# Patient Record
Sex: Female | Born: 1955
Health system: Southern US, Community
[De-identification: ages and names within clinical notes are randomized; demographics above are authoritative.]

## PROBLEM LIST (undated history)

## (undated) DIAGNOSIS — D61818 Other pancytopenia: Secondary | ICD-10-CM

## (undated) DIAGNOSIS — I10 Essential (primary) hypertension: Secondary | ICD-10-CM

## (undated) DIAGNOSIS — E65 Localized adiposity: Secondary | ICD-10-CM

## (undated) HISTORY — DX: Localized adiposity: E65

## (undated) HISTORY — PX: TONSILLECTOMY: SUR1361

## (undated) HISTORY — DX: Essential (primary) hypertension: I10

## (undated) HISTORY — PX: KNEE SURGERY: SHX244

---

## 2000-10-13 ENCOUNTER — Encounter: Payer: Self-pay | Admitting: Family Medicine

## 2000-10-13 ENCOUNTER — Ambulatory Visit (HOSPITAL_COMMUNITY): Admission: RE | Admit: 2000-10-13 | Discharge: 2000-10-13 | Payer: Self-pay | Admitting: Family Medicine

## 2000-10-21 ENCOUNTER — Encounter: Payer: Self-pay | Admitting: Family Medicine

## 2000-10-21 ENCOUNTER — Ambulatory Visit (HOSPITAL_COMMUNITY): Admission: RE | Admit: 2000-10-21 | Discharge: 2000-10-21 | Payer: Self-pay | Admitting: Family Medicine

## 2001-01-01 ENCOUNTER — Ambulatory Visit: Admission: RE | Admit: 2001-01-01 | Discharge: 2001-01-01 | Payer: Self-pay | Admitting: Family Medicine

## 2001-01-01 ENCOUNTER — Encounter: Payer: Self-pay | Admitting: Family Medicine

## 2004-02-01 ENCOUNTER — Other Ambulatory Visit: Admission: RE | Admit: 2004-02-01 | Discharge: 2004-02-01 | Payer: Self-pay | Admitting: Family Medicine

## 2005-06-05 ENCOUNTER — Other Ambulatory Visit: Admission: RE | Admit: 2005-06-05 | Discharge: 2005-06-05 | Payer: Self-pay | Admitting: Family Medicine

## 2005-11-27 ENCOUNTER — Encounter: Admission: RE | Admit: 2005-11-27 | Discharge: 2005-11-27 | Payer: Self-pay | Admitting: Family Medicine

## 2006-06-25 ENCOUNTER — Other Ambulatory Visit: Admission: RE | Admit: 2006-06-25 | Discharge: 2006-06-25 | Payer: Self-pay | Admitting: Family Medicine

## 2006-09-09 ENCOUNTER — Encounter: Admission: RE | Admit: 2006-09-09 | Discharge: 2006-09-09 | Payer: Self-pay | Admitting: Orthopedic Surgery

## 2007-01-07 ENCOUNTER — Encounter: Admission: RE | Admit: 2007-01-07 | Discharge: 2007-01-07 | Payer: Self-pay | Admitting: Family Medicine

## 2008-01-08 ENCOUNTER — Encounter: Admission: RE | Admit: 2008-01-08 | Discharge: 2008-01-08 | Payer: Self-pay | Admitting: Family Medicine

## 2008-01-21 ENCOUNTER — Ambulatory Visit: Payer: Self-pay | Admitting: Cardiovascular Disease

## 2008-01-21 ENCOUNTER — Ambulatory Visit: Payer: Self-pay

## 2008-01-21 LAB — CONVERTED CEMR LAB
Free T4: 0.7 ng/dL (ref 0.6–1.6)
GFR calc non Af Amer: 70 mL/min
Glucose, Bld: 119 mg/dL — ABNORMAL HIGH (ref 70–99)
MCHC: 34.3 g/dL (ref 30.0–36.0)
Monocytes Relative: 7.2 % (ref 3.0–12.0)
Neutrophils Relative %: 52.7 % (ref 43.0–77.0)
Platelets: 255 10*3/uL (ref 150–400)
RBC: 4.03 M/uL (ref 3.87–5.11)
RDW: 13.6 % (ref 11.5–14.6)
T3, Free: 2.8 pg/mL (ref 2.3–4.2)
TSH: 1.13 microintl units/mL (ref 0.35–5.50)
WBC: 6.6 10*3/uL (ref 4.5–10.5)

## 2008-02-08 ENCOUNTER — Ambulatory Visit: Payer: Self-pay

## 2008-02-08 ENCOUNTER — Encounter: Payer: Self-pay | Admitting: Cardiovascular Disease

## 2008-02-22 ENCOUNTER — Ambulatory Visit: Payer: Self-pay | Admitting: Cardiovascular Disease

## 2008-03-17 ENCOUNTER — Encounter: Payer: Self-pay | Admitting: Cardiovascular Disease

## 2008-03-17 ENCOUNTER — Ambulatory Visit: Payer: Self-pay | Admitting: Cardiovascular Disease

## 2008-03-17 DIAGNOSIS — E663 Overweight: Secondary | ICD-10-CM | POA: Insufficient documentation

## 2008-03-17 DIAGNOSIS — I428 Other cardiomyopathies: Secondary | ICD-10-CM

## 2008-03-17 DIAGNOSIS — M109 Gout, unspecified: Secondary | ICD-10-CM

## 2008-03-17 DIAGNOSIS — I10 Essential (primary) hypertension: Secondary | ICD-10-CM | POA: Insufficient documentation

## 2008-03-17 DIAGNOSIS — R002 Palpitations: Secondary | ICD-10-CM | POA: Insufficient documentation

## 2008-04-08 ENCOUNTER — Ambulatory Visit: Payer: Self-pay | Admitting: Cardiovascular Disease

## 2008-04-08 ENCOUNTER — Ambulatory Visit: Payer: Self-pay

## 2008-06-06 ENCOUNTER — Encounter: Payer: Self-pay | Admitting: Cardiovascular Disease

## 2008-06-06 ENCOUNTER — Ambulatory Visit: Payer: Self-pay | Admitting: Cardiovascular Disease

## 2008-06-06 LAB — CONVERTED CEMR LAB
ALT: 24 units/L (ref 0–35)
AST: 21 units/L (ref 0–37)
Bilirubin, Direct: 0.1 mg/dL (ref 0.0–0.3)
CO2: 29 meq/L (ref 19–32)
Calcium: 9.2 mg/dL (ref 8.4–10.5)
Chloride: 108 meq/L (ref 96–112)
Glucose, Bld: 152 mg/dL — ABNORMAL HIGH (ref 70–99)
Magnesium: 2.1 mg/dL (ref 1.5–2.5)
Potassium: 4 meq/L (ref 3.5–5.1)
Sodium: 143 meq/L (ref 135–145)
Total Bilirubin: 0.6 mg/dL (ref 0.3–1.2)
Total Protein: 6.4 g/dL (ref 6.0–8.3)

## 2008-06-09 ENCOUNTER — Encounter (INDEPENDENT_AMBULATORY_CARE_PROVIDER_SITE_OTHER): Payer: Self-pay | Admitting: *Deleted

## 2008-07-15 ENCOUNTER — Other Ambulatory Visit: Admission: RE | Admit: 2008-07-15 | Discharge: 2008-07-15 | Payer: Self-pay | Admitting: Family Medicine

## 2008-08-08 ENCOUNTER — Ambulatory Visit: Payer: Self-pay

## 2008-08-08 ENCOUNTER — Ambulatory Visit: Payer: Self-pay | Admitting: Cardiovascular Disease

## 2008-08-08 ENCOUNTER — Encounter: Payer: Self-pay | Admitting: Cardiovascular Disease

## 2008-09-30 ENCOUNTER — Telehealth: Payer: Self-pay | Admitting: Cardiovascular Disease

## 2008-11-16 ENCOUNTER — Ambulatory Visit: Payer: Self-pay | Admitting: Internal Medicine

## 2008-12-07 ENCOUNTER — Encounter (INDEPENDENT_AMBULATORY_CARE_PROVIDER_SITE_OTHER): Payer: Self-pay | Admitting: *Deleted

## 2009-01-09 ENCOUNTER — Encounter: Admission: RE | Admit: 2009-01-09 | Discharge: 2009-01-09 | Payer: Self-pay | Admitting: Family Medicine

## 2009-03-07 ENCOUNTER — Ambulatory Visit: Payer: Self-pay | Admitting: Cardiovascular Disease

## 2009-05-29 ENCOUNTER — Ambulatory Visit: Payer: Self-pay | Admitting: Cardiovascular Disease

## 2009-07-24 ENCOUNTER — Telehealth: Payer: Self-pay | Admitting: Cardiovascular Disease

## 2009-08-02 ENCOUNTER — Ambulatory Visit: Payer: Self-pay | Admitting: Cardiovascular Disease

## 2009-08-04 ENCOUNTER — Encounter (INDEPENDENT_AMBULATORY_CARE_PROVIDER_SITE_OTHER): Payer: Self-pay | Admitting: *Deleted

## 2009-08-25 ENCOUNTER — Ambulatory Visit: Payer: Self-pay | Admitting: Cardiovascular Disease

## 2009-08-25 ENCOUNTER — Ambulatory Visit (HOSPITAL_COMMUNITY): Admission: RE | Admit: 2009-08-25 | Discharge: 2009-08-25 | Payer: Self-pay | Admitting: Cardiovascular Disease

## 2009-08-28 ENCOUNTER — Telehealth: Payer: Self-pay | Admitting: Cardiovascular Disease

## 2009-10-02 ENCOUNTER — Encounter: Payer: Self-pay | Admitting: Cardiovascular Disease

## 2009-10-04 ENCOUNTER — Telehealth: Payer: Self-pay | Admitting: Cardiovascular Disease

## 2009-10-30 ENCOUNTER — Other Ambulatory Visit: Admission: RE | Admit: 2009-10-30 | Discharge: 2009-10-30 | Payer: Self-pay | Admitting: Family Medicine

## 2009-12-20 ENCOUNTER — Ambulatory Visit: Payer: Self-pay | Admitting: Cardiovascular Disease

## 2010-01-17 ENCOUNTER — Encounter: Admission: RE | Admit: 2010-01-17 | Discharge: 2010-01-17 | Payer: Self-pay | Admitting: Family Medicine

## 2010-03-01 ENCOUNTER — Telehealth: Payer: Self-pay | Admitting: Cardiovascular Disease

## 2010-03-20 NOTE — Progress Notes (Signed)
Summary: questions re lasartin  Phone Note Call from Patient   Caller: Patient [phone] Reason for Call: Talk to Nurse Summary of Call: med was changed to lasartin and blood pressure has been high-can't get it regulated since been on med-pls call [phone] Initial call taken by: Glynda Jaeger,  August 28, 2009 1:19 PM  Follow-up for Phone Call        pt on break when I called Deliah Goody, RN  August 28, 2009 4:30 PM  spoke with pt, she got 176/90's last night for her bp and was concerned. usually her bp runs 120-130's. she will track her bp and if it is consistantly running high she will let us know Deliah Goody, RN  August 28, 2009 5:51 PM

## 2010-03-20 NOTE — Assessment & Plan Note (Signed)
Summary: Karen Waller   Referring Provider:  Dr. Eden Emms  CC:  pt complainsof palpitations.  History of Present Illness: Anecia has a history of DCM with EF 35%.  Her EF improved to 55% by echo 01/2009.  She has had palpitations and PVC's.  She saw Dr Graciela Husbands who recommended adding Ca blocker to BB if symptoms persisted especially since her EF improved.  Her DCM was likely related to HTN which is imporoved on Rx.  She feels that her palpitations are manageable.  We will do and event monitor if they worsen.  She will stop her hydralazne since her BO was been well controlled.  She has not had any SSCP, synopce, dypnea or edema.  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 1 Tab  Two Times A Day 2)  Hyzaar 100-25 Mg Tabs (Losartan Potassium-Hctz) .... Take 1 Tablet By Mouth Once A Day 3)  Celebrex 200 Mg Caps (Celecoxib) .... As Needed 4)  Vitamin D3 2000 Unit Caps (Cholecalciferol) .... Take One Cap Once Daily  Allergies (verified): 1)  ! Sulfa  Past History:  Past Medical History: Last updated: 03/17/2008 Hypertensin Gout:  left knee Cardiomyopathy:  EF 30-35% diagnosed 02/2008 Central Obesity  Past Surgical History: Last updated: 03/17/2008 Tonsilectomy Knee    Family History: Last updated: 03/17/2008 Mother with Hypertension  Social History: Last updated: 03/17/2008 Non-smoker Quit in 87 Occasional EToh Married 2 children Works at [employer]  Review of Systems       Denies fever, malais, weight loss, blurry vision, decreased visual acuity, cough, sputum, SOB, hemoptysis, pleuritic pain, , heartburn, abdominal pain, melena, lower extremity edema, claudication,  or rash. All other systems reviewed and negative  Vital Signs:  Patient profile:   55 year old female Height:      66 inches Weight:      187 pounds BMI:     30.29 Pulse rate:   62 / minute Resp:     14 per minute BP sitting:   110 / 80  (left arm)  Vitals Entered By: Kem Parkinson (March 07, 2009 3:59 PM)  Physical Exam  General:  Affect appropriate Healthy:  appears stated age HEENT: normal Neck supple with no adenopathy JVP normal no bruits no thyromegaly Lungs clear with no wheezing and good diaphragmatic motion Heart:  S1/S2 no murmur,rub, gallop or click PMI normal Abdomen: benighn, BS positve, no tenderness, no AAA no bruit.  No HSM or HJR Distal pulses intact with no bruits No edema Neuro non-focal Skin warm and dry    Impression & Recommendations:  Problem # 1:  CARDIOMYOPATHY (ICD-425.4) Improved EF by echo.  Continue BB and ARB Her updated medication list for this problem includes:    Coreg 12.5 Mg Tabs (Carvedilol) .Marland Kitchen... 1 tab  two times a day    Hyzaar 100-25 Mg Tabs (Losartan potassium-hctz) .Marland Kitchen... Take 1 tablet by mouth once a day  Problem # 2:  PALPITATIONS, OCCASIONAL (ICD-785.1) BB Event monitor if worsen to document PVC's Her updated medication list for this problem includes:    Coreg 12.5 Mg Tabs (Carvedilol) .Marland Kitchen... 1 tab  two times a day  Problem # 3:  ESSENTIAL HYPERTENSION, BENIGN (ICD-401.1) Stop Hydralazine as  BP seems much better and long term risk of side effects like effusion The following medications were removed from the medication list:    Hydralazine Hcl 50 Mg Tabs (Hydralazine hcl) .Marland Kitchen... Take 1 by mouth two times a day Her updated medication list for this problem includes:    Coreg 12.5 Mg Tabs (Carvedilol) .Marland Kitchen... 1 tab  two times a day    Hyzaar 100-25 Mg Tabs (Losartan potassium-hctz) .Marland Kitchen... Take 1 tablet by mouth once a day  Patient Instructions: 1)  Your physician recommends that you schedule a follow-up appointment in: 3  MONTHS 2)  Your physician has recommended you make the following change in your medication: STOP HYDRALAZINE

## 2010-03-20 NOTE — Assessment & Plan Note (Signed)
Summary: F6M/DM   Referring Provider:  Dr. Eden Emms  CC:  check up.  History of Present Illness: Karen Waller is seen today for DCM, HTN palpitations and elevated lipids.  She sees Research scientist (life sciences) at [facility] as her primary. She denies syncope, SSCP, dyspnea or edema.  Her MRI 5/11 showed normal EF.  Her EF had been as low as 35%.  She is tolerating BB and ARB with no palpitations.  Work at [employer] is [employer] well.  She is seeing a dietician for initial Rx of her elevated lipids.  Compliant with meds  Current Problems (verified): 1)  Overweight  (ICD-278.02) 2)  Gouty Arthropathy Unspecified  (ICD-274.00) 3)  Cardiomyopathy  (ICD-425.4) 4)  Palpitations, Occasional  (ICD-785.1) 5)  Essential Hypertension, Benign  (ICD-401.1)  Current Medications (verified): 1)  Coreg 12.5 Mg Tabs (Carvedilol) .Marland Kitchen.. 1 Tab  Two Times A Day 2)  Losartan Potassium 100 Mg Tabs (Losartan Potassium) .Marland Kitchen.. 1 Once Daily 3)  Celebrex 200 Mg Caps (Celecoxib) .... As Needed 4)  Vitamin D3 2000 Unit Caps (Cholecalciferol) .... Take One Cap Once Daily 5)  Multivitamins   Tabs (Multiple Vitamin) .Marland Kitchen.. 1 Tab Po Once Daily  Allergies (verified): 1)  ! Sulfa  Past History:  Past Medical History: Last updated: 03/17/2008 Hypertensin Gout:  left knee Cardiomyopathy:  EF 30-35% diagnosed 02/2008 Central Obesity  Past Surgical History: Last updated: 03/17/2008 Tonsilectomy Knee    Family History: Last updated: 03/17/2008 Mother with Hypertension  Social History: Last updated: 03/17/2008 Non-smoker Quit in 87 Occasional EToh Married 2 children Works at [employer]  Review of Systems       Denies fever, malais, weight loss, blurry vision, decreased visual acuity, cough, sputum, SOB, hemoptysis, pleuritic pain, palpitaitons, heartburn, abdominal pain, melena, lower extremity edema, claudication, or rash.   Vital Signs:  Patient profile:   55 year old female Height:      66 inches Weight:      185 pounds BMI:      29.97 Pulse rate:   74 / minute Resp:     14 per minute BP sitting:   130 / 75  (left arm)  Vitals Entered By: Kem Parkinson (December 20, 2009 12:09 PM)  Physical Exam  General:  Affect appropriate Healthy:  appears stated age HEENT: normal Neck supple with no adenopathy JVP normal no bruits no thyromegaly Lungs clear with no wheezing and good diaphragmatic motion Heart:  S1/S2 no murmur,rub, gallop or click PMI normal Abdomen: benighn, BS positve, no tenderness, no AAA no bruit.  No HSM or HJR Distal pulses intact with no bruits No edema Neuro non-focal Skin warm and dry    Impression & Recommendations:  Problem # 1:  CARDIOMYOPATHY (ICD-425.4) Improved continue current Rx  F/U MRI or echo in a year Her updated medication list for this problem includes:    Coreg 12.5 Mg Tabs (Carvedilol) .Marland Kitchen... 1 tab  two times a day    Losartan Potassium 100 Mg Tabs (Losartan potassium) .Marland Kitchen... 1 once daily  Problem # 2:  ESSENTIAL HYPERTENSION, BENIGN (ICD-401.1) Well controlled consider diuretic addition in future Her updated medication list for this problem includes:    Coreg 12.5 Mg Tabs (Carvedilol) .Marland Kitchen... 1 tab  two times a day    Losartan Potassium 100 Mg Tabs (Losartan potassium) .Marland Kitchen... 1 once daily  Problem # 3:  PALPITATIONS, OCCASIONAL (ICD-785.1) Resolved on BB no evid of CAD and improved EF Her updated medication list for this problem includes:  Coreg 12.5 Mg Tabs (Carvedilol) .Marland Kitchen... 1 tab  two times a day  Patient Instructions: 1)  Your physician recommends that you schedule a follow-up appointment in: YEAR WITH DR Eden Emms 2)  Your physician recommends that you continue on your current medications as directed. Please refer to the Current Medication list given to you today.

## 2010-03-20 NOTE — Progress Notes (Signed)
Summary: dose b/p meds/rtn Debra's call  Phone Note Call from Patient Call back at [phone]   Caller: Patient Reason for Call: Talk to Nurse, Talk to Doctor Summary of Call: pt is due for a refill and wants to know if her meds will be changed since she had an eval Initial call taken by: Omer Jack,  October 04, 2009 1:12 PM  Follow-up for Phone Call        Left message to call back Deliah Goody, RN  October 04, 2009 1:54 PM  pt needs a call today she is rtning a call to [place]. please call pt will be at this number until 4:35p she is on break [phone] if you call after 4:35 pls call this number  [phone] Omer Jack  October 04, 2009 4:12 PM   I spoke with the pt and she brought in a list of her BP readings yesterday and gave them to our front desk.  I do not have a copy of those readings at this time.  The pt looked up her BP readings and they have been anywhere from 130-160/80.  The pt ran out of her medication yesterday.  I made her aware that Dr Eden Emms would be back in our office next week and could review her BP readings and make further recommendations about medications at that time.  I advised the pt that she should get her Losartan refilled at this time.  Pt agreed. Follow-up by: Julieta Gutting, RN, BSN,  October 04, 2009 4:38 PM  Additional Follow-up for Phone Call Additional follow up Details #1::        will foward for dr Eden Emms review Deliah Goody, RN  October 06, 2009 11:55 AM\par      Additional Follow-up for Phone Call Additional follow up Details #2::    ok to refill hyzaar and coreg at current doses Follow-up by: Colon Branch, MD, [facility],  October 08, 2009 10:21 PM  Additional Follow-up for Phone Call Additional follow up Details #3:: Details for Additional Follow-up Action Taken: pt aware to cont on her present meds Deliah Goody, RN  October 10, 2009 12:41 PM

## 2010-03-20 NOTE — Op Note (Signed)
Summary: Patient's At Home Vitals   Patient's At Home Vitals   Imported By: Roderic Ovens 12/26/2009 12:44:03  _____________________________________________________________________  External Attachment:    Type:   Image     Comment:   External Document

## 2010-03-20 NOTE — Progress Notes (Signed)
Summary: palps,dizziness  Phone Note Call from Patient Call back at [phone]   Caller: Patient Reason for Call: Talk to Nurse Summary of Call: having dizziness and palps, has appt 6/15 Initial call taken by: Migdalia Dk,  July 24, 2009 8:39 AM  Follow-up for Phone Call        has been having dizziness and palps for about a week or two, feels like room is spinniner around and has a little dizziness when she stands, she reports her BP has been ok when she checks it, advised pt to f/u w/pcp she is agreeable Meredith Staggers, RN  July 24, 2009 8:51 AM

## 2010-03-20 NOTE — Assessment & Plan Note (Signed)
Summary: F3M/DM   Referring Provider:  Dr. Eden Emms  CC:  check up.  History of Present Illness: Karen Waller has a history of DCM with EF 35%.  Her EF improved to 55% by echo 01/2009.  She has had palpitations and PVC's.  She saw Dr Graciela Husbands who recommended adding Ca blocker to BB if symptoms persisted especially since her EF improved.  Her DCM was likely related to HTN which is imporoved on Rx.  She feels that her palpitations are manageable.  We will do and event monitor if they worsen.  She will stop her hydralazne since her BO was been well controlled.  She has not had any SSCP, synopce, dypnea or edema.  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    Current Problems (verified): 1)  Overweight  (ICD-278.02) 2)  Gouty Arthropathy Unspecified  (ICD-274.00) 3)  Cardiomyopathy  (ICD-425.4) 4)  Palpitations, Occasional  (ICD-785.1) 5)  Essential Hypertension, Benign  (ICD-401.1)  Current Medications (verified): 1)  Coreg 12.5 Mg Tabs (Carvedilol) .Marland Kitchen.. 1 Tab  Two Times A Day 2)  Hyzaar 100-25 Mg Tabs (Losartan Potassium-Hctz) .... Take 1 Tablet By Mouth Once A Day 3)  Celebrex 200 Mg Caps (Celecoxib) .... As Needed 4)  Vitamin D3 2000 Unit Caps (Cholecalciferol) .... Take One Cap Once Daily 5)  Multivitamins   Tabs (Multiple Vitamin) .Marland Kitchen.. 1 Tab Po Once Daily  Allergies (verified): 1)  ! Sulfa  Past History:  Past Medical History: Last updated: 03/17/2008 Hypertensin Gout:  left knee Cardiomyopathy:  EF 30-35% diagnosed 02/2008 Central Obesity  Past Surgical History: Last updated: 03/17/2008 Tonsilectomy Knee    Family History: Last updated: 03/17/2008 Mother with Hypertension  Social History: Last updated: 03/17/2008 Non-smoker Quit in 87 Occasional EToh Married 2 children Works at [employer]  Review of Systems       Denies fever, malais, weight loss, blurry vision, decreased visual acuity, cough,  sputum, SOB, hemoptysis, pleuritic pain, palpitaitons, heartburn, abdominal pain, melena, lower extremity edema, claudication, or rash.   Vital Signs:  Patient profile:   55 year old female Height:      66 inches Weight:      184 pounds BMI:     29.81 Pulse rate:   64 / minute Resp:     14 per minute BP sitting:   115 / 77  (left arm)  Vitals Entered By: Kem Parkinson (May 29, 2009 12:39 PM)  Physical Exam  General:  Affect appropriate Healthy:  appears stated age HEENT: normal Neck supple with no adenopathy JVP normal no bruits no thyromegaly Lungs clear with no wheezing and good diaphragmatic motion Heart:  S1/S2 no murmur,rub, gallop or click PMI normal Abdomen: benighn, BS positve, no tenderness, no AAA no bruit.  No HSM or HJR Distal pulses intact with no bruits No edema Neuro non-focal Skin warm and dry    Impression & Recommendations:  Problem # 1:  CARDIOMYOPATHY (ICD-425.4) Improved.  Continue 2 drug Rx.  MRI to quantitatie EF in December Her updated medication list for this problem includes:    Coreg 12.5 Mg Tabs (Carvedilol) .Marland Kitchen... 1 tab  two times a day    Hyzaar 100-25 Mg Tabs (Losartan potassium-hctz) .Marland Kitchen... Take 1 tablet by mouth once a day  Problem # 2:  PALPITATIONS, OCCASIONAL (ICD-785.1) Improved.  Continue BB Her updated medication list for this problem includes:    Coreg 12.5  Mg Tabs (Carvedilol) .Marland Kitchen... 1 tab  two times a day  Problem # 3:  ESSENTIAL HYPERTENSION, BENIGN (ICD-401.1) Well contorlled continue attempts at weight loss and low sodium diet Her updated medication list for this problem includes:    Coreg 12.5 Mg Tabs (Carvedilol) .Marland Kitchen... 1 tab  two times a day    Hyzaar 100-25 Mg Tabs (Losartan potassium-hctz) .Marland Kitchen... Take 1 tablet by mouth once a day

## 2010-03-20 NOTE — Assessment & Plan Note (Signed)
Summary: rov/jss   Referring Provider:  Dr. Eden Emms  CC:  rov/pt was having dizziness and palpitations.  Her dizziness was explained by wax build-up per her PCP.Marland Kitchen  History of Present Illness: Karen Waller is seen today for F/U of DCM, HTN, palpitations and dizzyness.  She had an EF at one time of 35% with unRx HTN.  Last echo in 2010 showed normal EF.  She has seen SK before and he suggested she have a calcium blocker added to her BB but this was not done as her palpitations had improved.  She also was on hydralazine at one time but this was stopped as her BP was well controlled with Hyzaar and coreg.    Lately she has been having recurrant daily palpitations.  She has had more dyspnea.  She has had dizzyness. that is not necesarily related to the palpitations.  She may have an ear problem and is seeing ENT.  She has been compliant with her meds.    Current Problems (verified): 1)  Overweight  (ICD-278.02) 2)  Gouty Arthropathy Unspecified  (ICD-274.00) 3)  Cardiomyopathy  (ICD-425.4) 4)  Palpitations, Occasional  (ICD-785.1) 5)  Essential Hypertension, Benign  (ICD-401.1)  Current Medications (verified): 1)  Coreg 12.5 Mg Tabs (Carvedilol) .Marland Kitchen.. 1 Tab  Two Times A Day 2)  Hyzaar 100-25 Mg Tabs (Losartan Potassium-Hctz) .... Take 1 Tablet By Mouth Once A Day 3)  Celebrex 200 Mg Caps (Celecoxib) .... As Needed 4)  Vitamin D3 2000 Unit Caps (Cholecalciferol) .... Take One Cap Once Daily 5)  Multivitamins   Tabs (Multiple Vitamin) .Marland Kitchen.. 1 Tab Po Once Daily  Allergies (verified): 1)  ! Sulfa  Past History:  Past Medical History: Last updated: 03/17/2008 Hypertensin Gout:  left knee Cardiomyopathy:  EF 30-35% diagnosed 02/2008 Central Obesity  Past Surgical History: Last updated: 03/17/2008 Tonsilectomy Knee    Family History: Last updated: 03/17/2008 Mother with Hypertension  Social History: Last updated: 03/17/2008 Non-smoker Quit in 87 Occasional EToh Married 2  children Works at [employer]  Review of Systems       Denies fever, malais, weight loss, blurry vision, decreased visual acuity, cough, sputum, SOB, hemoptysis, pleuritic pain,, heartburn, abdominal pain, melena, lower extremity edema, claudication, or rash.   Vital Signs:  Patient profile:   55 year old female Height:      66 inches Weight:      186 pounds BMI:     30.13 Pulse rate:   71 / minute Pulse (ortho):   90 / minute Pulse rhythm:   regular BP sitting:   119 / 85  (left arm) BP standing:   100 / 80 Cuff size:   large  Vitals Entered By: Kem Parkinson (August 02, 2009 4:42 PM)  Physical Exam  General:  Affect appropriate Healthy:  appears stated age HEENT: normal Neck supple with no adenopathy JVP normal no bruits no thyromegaly Lungs clear with no wheezing and good diaphragmatic motion Heart:  S1/S2 no murmur,rub, gallop or click PMI normal Abdomen: benighn, BS positve, no tenderness, no AAA no bruit.  No HSM or HJR Distal pulses intact with no bruits No edema Neuro non-focal Skin warm and dry    Impression & Recommendations:  Problem # 1:  CARDIOMYOPATHY (ICD-425.4) Cardiac MRI to reassess LV function especially given symptoms Her updated medication list for this problem includes:    Coreg 12.5 Mg Tabs (Carvedilol) .Marland Kitchen... 1 tab  two times a day    Losartan Potassium 100 Mg Tabs (Losartan potassium) .Marland KitchenMarland KitchenMarland KitchenMarland Kitchen  1 once daily  Orders: EKG w/ Interpretation (93000) Cardiac MRI (Cardiac MRI)  Problem # 2:  PALPITATIONS, OCCASIONAL (ICD-785.1) Event monitor.  Continue BB.  R/O ventricular arrythmia Her updated medication list for this problem includes:    Coreg 12.5 Mg Tabs (Carvedilol) .Marland Kitchen... 1 tab  two times a day  Orders: Event (Event)  Problem # 3:  ESSENTIAL HYPERTENSION, BENIGN (ICD-401.1) Somewhat postural.  Given dizzyness and palpitations will stop diuretic. ( depletion of Mg and K may make palpitations worse) F/U 8-10 weeks assess BP on Cozaar 100  and coreg Her updated medication list for this problem includes:    Coreg 12.5 Mg Tabs (Carvedilol) .Marland Kitchen... 1 tab  two times a day    Losartan Potassium 100 Mg Tabs (Losartan potassium) .Marland Kitchen... 1 once daily  Problem # 4:  GOUTY ARTHROPATHY UNSPECIFIED (ICD-274.00) Advised her to stop all celebrex in light of her symptoms.  F/U with primary to see if there is another antiinflamatory that wont hurt her stomach and does not have a cardiac advisory.  Patient Instructions: 1)  Your physician recommends that you schedule a follow-up appointment in: 8-10 WEEKS WITH DR Eden Emms 2)  Your physician has recommended you make the following change in your medication: STOP HYZAAR 3)  Your physician has recommended that you wear an event monitor.  Event monitors are medical devices that record the heart's electrical activity. Doctors most often use these monitors to diagnose arrhythmias. Arrhythmias are problems with the speed or rhythm of the heartbeat. The monitor is a small, portable device. You can wear one while you do your normal daily activities. This is usually used to diagnose what is causing palpitations/syncope (passing out). 4)  Your physician has requested that you have a cardiac MRI.  Cardiac MRI uses a computer to create images of your heart as it's beating, producing both still and moving pictures of your heart and major blood vessels. For further information please visit  [email or web address]  Please follow the instruction sheet given to you today for more information. Prescriptions: LOSARTAN POTASSIUM 100 MG TABS (LOSARTAN POTASSIUM) 1 once daily  #30 x 11   Entered by:   Scherrie Bateman, LPN   Authorized by:   Colon Branch, MD, [facility]   Signed by:   Scherrie Bateman, LPN on 16/11/9602   Method used:   Electronically to        [id number] (retail)       [address] [place], [place]  [address]       Ph: [phone]       Fax: [phone]   RxID:    [id number]    EKG Report  Procedure date:  08/02/2009  Findings:      NSR 71 QT 404  Normal ECG

## 2010-03-20 NOTE — Letter (Signed)
Summary: Appointment - Cardiac MRI  [facility]     [place], [place]    Phone:   Fax:       August 04, 2009 MRN: [id number]   Karen Waller [address] [place], [place]  [address]   Dear [name],   We have scheduled the above patient for an appointment for a Cardiac MRI on 08-25-2009  at 11:00a.m.  Please refer to the below information for the location and instructions for this test:  Location:     [facility]       [address]       [place], [place]  [address] Instructions:    Wilmon Arms at [facility] Outpatient Registration 45 minutes prior to your appointment time.  This will ensure you are in the Radiology Department 30 minutes prior to your appointment.    There are no restrictions for this test you may eat and take medications as usual.  If you need to reschedule this appointment please call at the number listed above.  Sincerely,      Lorne Skeens  [facility] Scheduling Team

## 2010-03-22 NOTE — Progress Notes (Signed)
Summary: elevated b/p  Phone Note Call from Patient Call back at [phone]   Caller: Patient Reason for Call: Talk to Nurse, Talk to Doctor Summary of Call: pt b/p is still a little high and she wants to know what to do Initial call taken by: Omer Jack,  March 01, 2010 3:50 PM  Follow-up for Phone Call        spoke with pt, she reports her bp running 147/90 to 151/92. will foward for dr Eden Emms for his review Deliah Goody, RN  March 01, 2010 5:25 PM\par  Additional Follow-up for Phone Call Additional follow up Details #1::        Add HCTZ 12.5 mg F/U BMET 4 weeks and with me 6 weeks Additional Follow-up by: Colon Branch, MD, [facility],  March 05, 2010 11:54 AM    Additional Follow-up for Phone Call Additional follow up Details #2::    pt aware of med change and need for repeat labs. she will call back to schedule appt. Deliah Goody, RN  March 06, 2010 5:16 PM  New/Updated Medications: HYDROCHLOROTHIAZIDE 12.5 MG TABS (HYDROCHLOROTHIAZIDE) Take one tablet by mouth daily. Prescriptions: HYDROCHLOROTHIAZIDE 12.5 MG TABS (HYDROCHLOROTHIAZIDE) Take one tablet by mouth daily.  #30 x 12   Entered by:   Deliah Goody, RN   Authorized by:   Colon Branch, MD, [facility]   Signed by:   Deliah Goody, RN on 03/06/2010   Method used:   Electronically to        [id number]* (retail)       [address] [place], [place]  [address]       Ph: [phone]       Fax: [phone]   RxID:   [id number]

## 2010-04-02 ENCOUNTER — Other Ambulatory Visit (INDEPENDENT_AMBULATORY_CARE_PROVIDER_SITE_OTHER): Payer: BC Managed Care – PPO

## 2010-04-02 ENCOUNTER — Encounter: Payer: Self-pay | Admitting: Cardiovascular Disease

## 2010-04-02 ENCOUNTER — Other Ambulatory Visit: Payer: Self-pay | Admitting: Cardiovascular Disease

## 2010-04-02 DIAGNOSIS — Z79899 Other long term (current) drug therapy: Secondary | ICD-10-CM

## 2010-04-02 DIAGNOSIS — I10 Essential (primary) hypertension: Secondary | ICD-10-CM

## 2010-04-02 LAB — BASIC METABOLIC PANEL
BUN: 27 mg/dL — ABNORMAL HIGH (ref 6–23)
Creatinine, Ser: 1 mg/dL (ref 0.4–1.2)
GFR: 75.22 mL/min (ref >60.00)
Potassium: 4 mEq/L (ref 3.5–5.1)

## 2010-04-08 ENCOUNTER — Emergency Department (HOSPITAL_COMMUNITY)
Admission: EM | Admit: 2010-04-08 | Discharge: 2010-04-08 | Disposition: A | Discharge status: Home / Self Care | Payer: BC Managed Care – PPO | Attending: Emergency Medicine | Admitting: Emergency Medicine

## 2010-04-08 DIAGNOSIS — R55 Syncope and collapse: Secondary | ICD-10-CM | POA: Insufficient documentation

## 2010-04-08 DIAGNOSIS — R42 Dizziness and giddiness: Secondary | ICD-10-CM | POA: Insufficient documentation

## 2010-04-08 DIAGNOSIS — I10 Essential (primary) hypertension: Secondary | ICD-10-CM | POA: Insufficient documentation

## 2010-04-08 DIAGNOSIS — Z79899 Other long term (current) drug therapy: Secondary | ICD-10-CM | POA: Insufficient documentation

## 2010-04-08 LAB — POCT I-STAT, CHEM 8
BUN: 20 mg/dL (ref 6–23)
Calcium, Ion: 1.19 mmol/L (ref 1.12–1.32)
Chloride: 106 mEq/L (ref 96–112)
Glucose, Bld: 94 mg/dL (ref 70–99)
HCT: 42 % (ref 36.0–46.0)
Potassium: 3.9 mEq/L (ref 3.5–5.1)

## 2010-04-08 LAB — URINALYSIS, ROUTINE W REFLEX MICROSCOPIC
Ketones, ur: NEGATIVE mg/dL
Specific Gravity, Urine: 1.019 (ref 1.005–1.030)

## 2010-04-26 ENCOUNTER — Ambulatory Visit (INDEPENDENT_AMBULATORY_CARE_PROVIDER_SITE_OTHER): Payer: BC Managed Care – PPO | Admitting: Cardiovascular Disease

## 2010-04-26 ENCOUNTER — Encounter: Payer: Self-pay | Admitting: Cardiovascular Disease

## 2010-04-26 DIAGNOSIS — I517 Cardiomegaly: Secondary | ICD-10-CM

## 2010-04-26 DIAGNOSIS — I10 Essential (primary) hypertension: Secondary | ICD-10-CM

## 2010-05-01 NOTE — Assessment & Plan Note (Signed)
Summary: rov per pt call/lg   Visit Type:  Follow-up Referring Provider:  Dr. Eden Emms  CC:  pt stated got light headed.  History of Present Illness: Karen Waller is seen today for DCM, HTN palpitations and elevated lipids.  She sees Research scientist (life sciences) at [place] as her primary. She denies syncope, SSCP, dyspnea or edema.  Her MRI 5/11 showed normal EF.  Her EF had been as low as 35%.  She is tolerating BB and ARB with no palpitations.  Work at [employer] is [employer] well.  She is seeing a dietician for initial Rx of her elevated lipids.  Compliant with meds  Started on HCTZ about a month ago for elevated BP and improved.  BMET ok but slightly prerenal so will have her take diuretic every other day.  Had an episode of lightheadedness after working out, going to sauna and taking all of her meds  Current Problems (verified): 1)  Long-term (CURRENT) Use of Other Medications  (ICD-V58.69) 2)  Overweight  (ICD-278.02) 3)  Gouty Arthropathy Unspecified  (ICD-274.00) 4)  Cardiomyopathy  (ICD-425.4) 5)  Palpitations, Occasional  (ICD-785.1) 6)  Essential Hypertension, Benign  (ICD-401.1)  Current Medications (verified): 1)  Coreg 12.5 Mg Tabs (Carvedilol) .Marland Kitchen.. 1 Tab  Two Times A Day 2)  Losartan Potassium 100 Mg Tabs (Losartan Potassium) .Marland Kitchen.. 1 Once Daily 3)  Celebrex 200 Mg Caps (Celecoxib) .... As Needed 4)  Vitamin D3 2000 Unit Caps (Cholecalciferol) .... Take One Cap Couple Times A Week 5)  Multivitamins   Tabs (Multiple Vitamin) .Marland Kitchen.. 1 Tab Po Once Daily 6)  Hydrochlorothiazide 12.5 Mg Tabs (Hydrochlorothiazide) .... Take One Tablet By Mouth Daily.  Allergies (verified): 1)  ! Sulfa  Past History:  Past Medical History: Last updated: 03/17/2008 Hypertensin Gout:  left knee Cardiomyopathy:  EF 30-35% diagnosed 02/2008 Central Obesity  Past Surgical History: Last updated: 03/17/2008 Tonsilectomy Knee    Family History: Last updated: 03/17/2008 Mother with Hypertension  Social History: Last  updated: 03/17/2008 Non-smoker Quit in 87 Occasional EToh Married 2 children Works at [employer]  Review of Systems       Denies fever, malais, weight loss, blurry vision, decreased visual acuity, cough, sputum, SOB, hemoptysis, pleuritic pain, palpitaitons, heartburn, abdominal pain, melena, lower extremity edema, claudication, or rash.   Vital Signs:  Patient profile:   55 year old female Height:      66 inches Weight:      183.25 pounds BMI:     29.68 Pulse rate:   60 / minute BP sitting:   112 / 60  (left arm) Cuff size:   regular  Vitals Entered By: Caralee Ates CMA (April 26, 2010 9:16 AM)  Physical Exam  General:  Affect appropriate Healthy:  appears stated age HEENT: normal Neck supple with no adenopathy JVP normal no bruits no thyromegaly Lungs clear with no wheezing and good diaphragmatic motion Heart:  S1/S2 no murmur,rub, gallop or click PMI normal Abdomen: benighn, BS positve, no tenderness, no AAA no bruit.  No HSM or HJR Distal pulses intact with no bruits No edema Neuro non-focal Skin warm and dry    Impression & Recommendations:  Problem # 1:  CARDIOMYOPATHY (ICD-425.4) Stable F/U echo in 6 months Her updated medication list for this problem includes:    Coreg 12.5 Mg Tabs (Carvedilol) .Marland Kitchen... 1 tab  two times a day    Losartan Potassium 100 Mg Tabs (Losartan potassium) .Marland Kitchen... 1 once daily    Hydrochlorothiazide 12.5 Mg Tabs (Hydrochlorothiazide) .Marland KitchenMarland KitchenMarland KitchenMarland Kitchen  Take one tablet by mouth daily.  Problem # 2:  ESSENTIAL HYPERTENSION, BENIGN (ICD-401.1) Improved with diuretic Her updated medication list for this problem includes:    Coreg 12.5 Mg Tabs (Carvedilol) .Marland Kitchen... 1 tab  two times a day    Losartan Potassium 100 Mg Tabs (Losartan potassium) .Marland Kitchen... 1 once daily    Hydrochlorothiazide 12.5 Mg Tabs (Hydrochlorothiazide) .Marland Kitchen... Take one tablet by mouth daily.  Patient Instructions: 1)  Your physician wants you to follow-up in:6 MONTHS   You will receive a  reminder letter in the mail two months in advance. If you don't receive a letter, please call our office to schedule the follow-up appointment. 2)  Your physician has requested that you have an echocardiogram.  Echocardiography is a painless test that uses sound waves to create images of your heart. It provides your doctor with information about the size and shape of your heart and how well your heart's chambers and valves are working.  This procedure takes approximately one hour. There are no restrictions for this procedure.SCHEDULE IN 6 MONTHS

## 2010-05-06 LAB — BASIC METABOLIC PANEL
Chloride: 111 mEq/L (ref 96–112)
Creatinine, Ser: 1.1 mg/dL (ref 0.4–1.2)
GFR calc Af Amer: >60 mL/min (ref >60)
GFR calc non Af Amer: 52 mL/min — ABNORMAL LOW (ref >60)
Glucose, Bld: 132 mg/dL — ABNORMAL HIGH (ref 70–99)
Potassium: 4 mEq/L (ref 3.5–5.1)

## 2010-07-03 NOTE — Assessment & Plan Note (Signed)
[facility]                            CARDIOLOGY OFFICE NOTE   NAME:Waller, Karen Waller                     MRN:          [id number]  DATE:01/21/2008                            DOB:          1955/10/30    REQUESTING PHYSICIAN:  Stacie Acres. Cliffton Asters, MD   Ms. Karen Waller is seen today at the request of Dr. Laurann Montana for  shortness of breath and palpitations.  The patient has had previous  palpitations; however, they have gotten worse over the last few months.  The palpitations interestingly are less obvious with exercise.  She  tends to do a lot of water aerobics and aerobic activity for exercise  and she does not notice them as much.  She notices them more when she is  sedentary.  They occur every day.  They can occur almost throughout the  day.  She feels some flip-flops in irregularities; however, sometimes  they do last for quite a long time in rapid succession.  There is no  associated diaphoresis.  She does have exertional dyspnea.  It is  sometimes made worse by her palpitations.  There has been no chest pain.  No history of chronic lung disease.  No PND or orthopnea.  No lower  extremity edema.   The patient has significant anxiety about the symptoms.   In regards to her heart, she has not had a previous workup.   She has had hypertension on therapy.  I believe lisinopril was added  recently for blood pressure control.  She has been on beta-blockers for  quite some time.  She thinks the Toprol dose was increased to 200 a day  about 2 years ago.  Her review of systems otherwise remarkable for  significant sinus problems.  She feels a rush of heartbeat in her left  ear and has significant headaches.  As far as I know, she has not had a  CT or MRI scan.   The patient is happily married.  She has 2 children.  She works at [employer].  She does water aerobics.  She does not smoke or drink.   She has had previous knee surgery and previous  tonsillectomy.   FAMILY HISTORY:  Remarkable for no premature coronary disease.  Mother  and father both deceased.  The patient does not smoke or drink.  She  quit smoking in 1986.   The patient's medications include Uloric for gout, indomethacin p.r.n.,  Toprol 200 a day, lisinopril 10 a day, and multivitamins.   PHYSICAL EXAMINATION:  GENERAL:  Remarkable for an overweight black  female in no distress.  VITAL SIGNS:  Weight is 205, blood pressure 130/80, pulse 80 and  regular, respiratory rate 14, afebrile.  HEENT:  Unremarkable.  NECK:  Carotids are without bruit.  No lymphadenopathy, thyromegaly, or  JVP elevation.  LUNGS:  Clear with good diaphragmatic motion.  No wheezing.  HEART:  S1 and S2.  Normal heart sounds.  PMI not palpable.  ABDOMEN:  Benign.  Bowel sounds positive.  No AAA, no tenderness, no  bruit, no hepatosplenomegaly, no hepatojugular reflex,  no tenderness.  EXTREMITIES:  Distal pulses are intact.  No edema.  NEURO:  Nonfocal.  SKIN:  Warm and dry.  MUSCULOSKELETAL:  No muscular weakness.   EKG shows sinus rhythm with a nonspecific ST-T-wave changes, poor R-wave  progression.   IMPRESSION:  1. Palpitations.  They sound benign, particularly since they are not      worsened by exercise.  Continue low-dose aspirin and beta-blocker.      Followup event monitor.  2. Hypertension, currently well controlled.  Continue low-sodium diet,      Toprol, and lisinopril.  3. Abnormal EKG likely related to hypertension given the patient's      palpitations.  I think, she should probably have an echocardiogram      to rule out significant left ventricular hypertrophy and assess      right ventricular and left ventricular function.  4. Dyspnea likely related to central obesity.  We will rule out      pulmonary hypertension as a screen with TR velocity on echo and      check left ventricular function.  5. Gout.  Continue Uloric and indomethacin p.r.n.   I will also check  lab work today to make sure the patient is not  hypokalemic given she is on lisinopril therapy and also to rule out  hyperthyroidism as a cause of her palpitations and make sure she is not  anemic in regards to her dyspnea.   I will see her back in a few weeks to go over her test results.     Noralyn Pick. Eden Emms, MD, [facility]  Electronically Signed    PCN/MedQ  DD: 01/21/2008  DT: 01/22/2008  Job #: [id number]

## 2010-07-03 NOTE — Assessment & Plan Note (Signed)
[facility]                            CARDIOLOGY OFFICE NOTE   NAME:Karen Waller, DONNESHA KARG                     MRN:          [id number]  DATE:04/08/2008                            DOB:          03-12-1955    Karen Waller returns today for followup.  She has severe hypertension.  She  had a renal duplex study today.  I reviewed it, her kidney sizes were  normal at about 11 cm.  There is no evidence of renal artery stenosis.  Her renal aortic ratios were 0.88 on the right and 0.72 in the left.   She has been compliant with her medications.  She had a nice blood  pressure chart on her iPhone for me to look at.  She still has a  systolics run in the 150-160 range and diastolics second run in the 90-  100 range.  She needs more medication.  She has been compliant with  which she is on.  After I saw her last time, we added hydralazine 50  b.i.d. to her carvedilol and Cozaar.   In my mind, there is a question in my previous notes about all of  alcohol intake.  She denies this previously, she had been on Uloric for  gout which exacerbated her blood pressure.  This has been stopped and  she has tried to limit her nonsteroidal intake, although she has  significant knee pain.   She also had in the past taken nonsteroidals for headaches.   After discussion with T[facility]ity, I think she needs another drug and we  will switch her Cozaar to Hyzaar and had a diuretic, which should  hopefully make her other blood pressure medicines work better.   REVIEW OF SYSTEMS:  Remarkable for occasional headaches.  She continues  to have occasional palpitations, but they are improved and she has  significant arthritis in her knees.   ALLERGIES:  She is allergic to SULFA.   MEDICATIONS:  1. Carvedilol 12.5 b.i.d.  2. Baby aspirin.  3. Cozaar 100 a day.  4. Hydralazine 50 b.i.d.   PHYSICAL EXAMINATION:  GENERAL:  Remarkable for an overweight black  female in no distress.  VITAL  SIGNS:  Her blood pressure is 150/80, her pulse is 72, and weight  is 208.  HEENT:  Unremarkable.  NECK:  Carotids are normal without bruit.  No lymphadenopathy,  thyromegaly, or JVP elevation.  LUNGS:  Clear.  Good diaphragmatic motion.  No wheezing.  CARDIAC:  S1-S2 with normal heart sounds.  PMI normal.  ABDOMEN:  Benign.  Bowel sounds positive.  No AAA, no tenderness, no  bruit, hepatosplenomegaly or hepatojugular reflux.  EXTREMITIES:  Distal pulses are intact.  No edema.  NEURO:  Nonfocal.  SKIN:  Warm and dry.  MUSCULOSKELETAL:  No muscular weakness.   Blood pressure chart reviewed and renal duplex reviewed.   IMPRESSION:  1. Hypertension, suboptimally controlled.  Switch Cozaar to Hyzaar.      Follow up in 3 months.  Check BMET at that time.  No evidence of      renal artery stenosis by duplex.  2.  Arthritis, try to use aspirin and Tylenol.  Also, continue to try      to avoid nonsteroidals.  3. History of gout.  Follow up primary care MD.  Check uric acid      levels.  Consider addition of allopurinol.  4. Central obesity one over low-carbohydrate diet with the patient,      exercise limited by knee pain.  She understands that part of the      issue in regards to her hypertension.  As her weight, she is also a      considerable risk for type 2 diabetes, leave it up her primary care      doctor to assess hemoglobin A1c.  I will see her back in about 3      months to reassess her blood pressure.  The Hyzaar prescription was      called into the [facility] on [address].     Noralyn Pick. Eden Emms, MD, [facility]  Electronically Signed    PCN/MedQ  DD: 04/08/2008  DT: 04/08/2008  Job #: [id number]

## 2010-07-03 NOTE — Assessment & Plan Note (Signed)
[facility]                            CARDIOLOGY OFFICE NOTE   NAME:Waller, Karen STENGEL                     MRN:          [id number]  DATE:02/22/2008                            DOB:          [birth year]    Karen Waller returns today for followup.  I saw her initially on January 21, 2008.  She had been having recurrent palpitations with difficult-to-  treat high blood pressure.  I think that some of the issue maybe  compliance, and when I saw Karen Waller in the office today her blood  pressure was quite high at 180/95 and she has not taken her medications  yet.  She indicated that both the Toprol and the lisinopril just made  her feel poorly and she did not like taking them.   In the past, she has been on a diuretic which have caused her gout to  flare and she is now on Uloric.   She had a 2-D echocardiogram which unfortunately showed significant  cardiomyopathy.  Her EF was in the 35-40% range.  There was diffuse  hypokinesis.  I suspect that it is a nonischemic cardiomyopathy.   Her event monitor interestingly showed that she was not having any  arrhythmias during most of her symptoms; however, she does have  occasional PVCs.   Her EKG on her last office visit has only showed sinus rhythm with poor  R-wave progression.  There is no evidence of previous MI and her QT  interval was 384.   I had a long discussion with Karen Waller regarding the diagnosis of probable  nonischemic cardiomyopathy as well as cardiomegaly.   We also talked about what we can do regarding her medications.  For the  time being, we will stop her TOPROL and LISINOPRIL since she does not  appear to tolerate them.  We will switch her to Coreg, 12.5 b.i.d. and  Cozaar 100 a day.   We will try to avoid a diuretic initially given her gout.   After we get her on more stable medications, she will need a BMET and a  BNP.  After this, I suspect that she will need a right and left heart  catheterization to rule out ischemic cardiomyopathy and assess her  filling pressures.   An alternative maybe due to a cardiac MRI, but the hospital's scanner is  currently down until March 08, 2007.   Her review of systems is otherwise negative.  She continues to have  occasional palpitations, PND, and orthopnea.  There is no lower  extremity edema.   Her medications will include:  1. Coreg 12.5 b.i.d.  2. Cozaar 100 a day.  3. Uloric.  4. P.r.n. indomethacin.  5. Multivitamins.   She is allergic to SULFA.   PHYSICAL EXAMINATION:  GENERAL:  Remarkable for an overweight black  female in no distress.  VITAL SIGNS:  Blood pressure is 180/90, pulse 73 and regular, weight  210, respiratory rate 14, afebrile.  HEENT:  Unremarkable.  NECK:  Carotids normal without bruit.  No lymphadenopathy, thyromegaly,  JVP elevation.  LUNGS:  Clear.  Good diaphragmatic motion.  No wheezing.  HEART:  S1 and S2 with distant heart sounds.  PMI not palpable.  ABDOMEN:  Benign.  Bowel sounds positive.  No AAA.  No tenderness.  No  bruit.  No hepatosplenomegaly, hepatojugular reflux, or tenderness.  EXTREMITIES:  Distal pulses are intact.  No edema.  NEUROLOGIC:  Nonfocal.  SKIN:  Warm and dry.  MUSCULOSKELETAL:  No muscular weakness.   Event monitor reviewed.  Echocardiogram reviewed.  EKG reviewed.   IMPRESSION:  1. Probable nonischemic cardiomyopathy.  Adjust medications.  At some      point, I suspect we will add hydralazine to her regimen as well.  I      will see her back in 4-6 weeks.  Once we get her blood pressure      under better control, we will check a BMET and a BNP, followup MRI,      or right and left heart cath depending on course and equipment      availability  2. Hypertension, poorly controlled.  Again adjust medications.      Decrease salt intake.  Part of her noncompliance issues have to do      with feeling poorly on her current medicines.  We will see if we      can  solve this by adjusting her meds.  3. Premature ventricular contractions.  I do not think that these are      significant in regards to her nonischemic cardiomyopathy.  We will      try to titrate up her beta-blocker as best we can.  Her heart cath      and/or MRI will help risk stratify any risk for malignant      arrhythmias.   Considerable time was spent with Karen Waller today going over her diagnosis  of nonischemic cardiomyopathy.  She clearly denied any alcohol intake to  me and has no other obvious etiologies to her cardiomyopathy.     Karen Waller. Eden Emms, MD, [facility]  Electronically Signed    PCN/MedQ  DD: 02/22/2008  DT: 02/23/2008  Job #: [id number]

## 2010-08-10 ENCOUNTER — Other Ambulatory Visit: Payer: Self-pay | Admitting: Cardiovascular Disease

## 2010-08-29 ENCOUNTER — Other Ambulatory Visit: Payer: Self-pay | Admitting: Cardiovascular Disease

## 2010-10-15 ENCOUNTER — Encounter: Payer: Self-pay | Admitting: *Deleted

## 2010-10-25 ENCOUNTER — Encounter: Payer: Self-pay | Admitting: Cardiovascular Disease

## 2010-10-29 ENCOUNTER — Other Ambulatory Visit (HOSPITAL_COMMUNITY): Payer: Self-pay | Admitting: Radiology

## 2010-10-30 ENCOUNTER — Ambulatory Visit (INDEPENDENT_AMBULATORY_CARE_PROVIDER_SITE_OTHER): Payer: BC Managed Care – PPO | Admitting: Cardiovascular Disease

## 2010-10-30 ENCOUNTER — Encounter: Payer: Self-pay | Admitting: Cardiovascular Disease

## 2010-10-30 ENCOUNTER — Ambulatory Visit (HOSPITAL_COMMUNITY): Payer: BC Managed Care – PPO | Attending: Cardiovascular Disease | Admitting: Radiology

## 2010-10-30 DIAGNOSIS — I10 Essential (primary) hypertension: Secondary | ICD-10-CM

## 2010-10-30 DIAGNOSIS — I079 Rheumatic tricuspid valve disease, unspecified: Secondary | ICD-10-CM | POA: Insufficient documentation

## 2010-10-30 DIAGNOSIS — I509 Heart failure, unspecified: Secondary | ICD-10-CM

## 2010-10-30 DIAGNOSIS — I059 Rheumatic mitral valve disease, unspecified: Secondary | ICD-10-CM | POA: Insufficient documentation

## 2010-10-30 DIAGNOSIS — R002 Palpitations: Secondary | ICD-10-CM | POA: Insufficient documentation

## 2010-10-30 DIAGNOSIS — E669 Obesity, unspecified: Secondary | ICD-10-CM | POA: Insufficient documentation

## 2010-10-30 DIAGNOSIS — R609 Edema, unspecified: Secondary | ICD-10-CM

## 2010-10-30 DIAGNOSIS — I428 Other cardiomyopathies: Secondary | ICD-10-CM

## 2010-10-30 NOTE — Patient Instructions (Signed)
Your physician wants you to follow-up in: one year You will receive a reminder letter in the mail two months in advance. If you don't receive a letter, please call our office to schedule the follow-up appointment.  

## 2010-10-30 NOTE — Assessment & Plan Note (Signed)
Mild dependant edema.  Continue HCTZ qod and low sodium diet

## 2010-10-30 NOTE — Assessment & Plan Note (Signed)
Well controlled.  Continue current medications and low sodium Dash type diet.    

## 2010-10-30 NOTE — Progress Notes (Signed)
Karen Waller is seen today for DCM, HTN palpitations and elevated lipids. She sees Research scientist (life sciences) at [place] as her primary. She denies syncope, SSCP, dyspnea or edema. Her MRI 5/11 showed normal EF. Her EF had been as low as 35%. She is tolerating BB and ARB with no palpitations. Work at [employer] is [employer] well. She is seeing a dietician for initial Rx of her elevated lipids. Compliant with meds  Taking HCTZ qod.    Reviewed echo from today.  Mild LVE  Mild diffuse hypokinesis EF 50% slightly worse in septum and apex  ROS: Denies fever, malais, weight loss, blurry vision, decreased visual acuity, cough, sputum, SOB, hemoptysis, pleuritic pain, palpitaitons, heartburn, abdominal pain, melena, lower extremity edema, claudication, or rash.  All other systems reviewed and negative  General: Affect appropriate Healthy:  appears stated age HEENT: normal Neck supple with no adenopathy JVP normal no bruits no thyromegaly Lungs clear with no wheezing and good diaphragmatic motion Heart:  S1/S2 no murmur,rub, gallop or click PMI normal Abdomen: benighn, BS positve, no tenderness, no AAA no bruit.  No HSM or HJR Distal pulses intact with no bruits No edema Neuro non-focal Skin warm and dry No muscular weakness   Current Outpatient Prescriptions  Medication Sig Dispense Refill  . carvedilol (COREG) 12.5 MG tablet Take 12.5 mg by mouth 2 (two) times daily with a meal.       . celecoxib (CELEBREX) 200 MG capsule Take 200 mg by mouth as needed.        . Cholecalciferol (VITAMIN D3) 2000 UNITS TABS Take 1 tablet by mouth 2 (two) times a week.        . hydrochlorothiazide (,MICROZIDE/HYDRODIURIL,) 12.5 MG capsule Take 12.5 mg by mouth every other day.       . losartan (COZAAR) 100 MG tablet TAKE 1 TABLET BY MOUTH DAILY  30 tablet  10  . Multiple Vitamin (MULTIVITAMIN) capsule Take 1 capsule by mouth daily.        Marland Kitchen PROAIR HFA 108 (90 BASE) MCG/ACT inhaler as needed.        Allergies  Sulfonamide  derivatives  Electrocardiogram:  SR 55 poor R wave progression nonspecific ST/T wave changes  No change from 08/02/09  Assessment and Plan

## 2010-10-30 NOTE — Assessment & Plan Note (Signed)
Mild nonishcemic DCM.  Continue medical Rx.  Functional class one  Taking spinning classes at [employer]

## 2010-10-30 NOTE — Assessment & Plan Note (Signed)
Resolved continue beta blocker  ECG ok today

## 2010-12-06 ENCOUNTER — Other Ambulatory Visit: Payer: Self-pay | Admitting: Cardiovascular Disease

## 2011-01-05 DIAGNOSIS — I10 Essential (primary) hypertension: Secondary | ICD-10-CM | POA: Insufficient documentation

## 2011-01-14 ENCOUNTER — Telehealth: Payer: Self-pay | Admitting: Cardiovascular Disease

## 2011-01-14 NOTE — Telephone Encounter (Signed)
New problem Pt wants to know if she is supposed to schedule a MRI please call her back

## 2011-01-14 NOTE — Telephone Encounter (Signed)
Spoke with pt in great length.  Reviewed pt's records not sure if she  needs another mri will forward to Dr Eden Emms for review .While talking with pt c/o  B/p  All over the place since having stomach bug week before  Thanksgiving  INSTRUCTED FOR  PT TO CONT TO MONITOR B/P  IF NO IMPROVEMENT  TO CALL BACK  WILL  ALSO INFORM DR Eden Emms OF ABOVE  AS WELL./CY

## 2011-01-15 NOTE — Telephone Encounter (Signed)
lmtcb ./cy 

## 2011-01-15 NOTE — Telephone Encounter (Signed)
FU Call: Pt returning call to Christine. Please call back.  

## 2011-01-15 NOTE — Telephone Encounter (Signed)
Does not need MRI yet

## 2011-01-17 NOTE — Telephone Encounter (Signed)
FU Call: Pt returning call to [facility] from yesterday. Pt made aware Wynona Canes was not in the office today. Please return pt call to discuss further.

## 2011-01-17 NOTE — Telephone Encounter (Signed)
Called patient and advised that Dr.Nishan states that she does not need a MRI at this time.

## 2011-03-06 ENCOUNTER — Other Ambulatory Visit: Payer: Self-pay | Admitting: Family Medicine

## 2011-03-06 DIAGNOSIS — Z1231 Encounter for screening mammogram for malignant neoplasm of breast: Secondary | ICD-10-CM

## 2011-03-22 ENCOUNTER — Ambulatory Visit
Admission: RE | Admit: 2011-03-22 | Discharge: 2011-03-22 | Disposition: A | Discharge status: Home / Self Care | Payer: BC Managed Care – PPO | Source: Ambulatory Visit | Attending: Family Medicine | Admitting: Family Medicine

## 2011-03-22 ENCOUNTER — Other Ambulatory Visit: Payer: Self-pay | Admitting: Cardiovascular Disease

## 2011-03-22 DIAGNOSIS — Z1231 Encounter for screening mammogram for malignant neoplasm of breast: Secondary | ICD-10-CM

## 2011-03-22 MED ORDER — HYDROCHLOROTHIAZIDE 12.5 MG PO CAPS: 12.5000 mg | ORAL_CAPSULE | ORAL | Status: DC

## 2011-03-22 MED ORDER — LOSARTAN POTASSIUM 100 MG PO TABS: 100.0000 mg | ORAL_TABLET | Freq: Every day | ORAL | Status: DC

## 2011-03-22 MED ORDER — CARVEDILOL 12.5 MG PO TABS: 12.5000 mg | ORAL_TABLET | Freq: Two times a day (BID) | ORAL | Status: DC

## 2011-03-22 NOTE — Telephone Encounter (Signed)
New Refill   Patient requests 3 month supply for meds be send to [facility])

## 2011-03-24 ENCOUNTER — Other Ambulatory Visit: Payer: Self-pay | Admitting: Cardiovascular Disease

## 2011-09-15 ENCOUNTER — Other Ambulatory Visit: Payer: Self-pay | Admitting: Cardiovascular Disease

## 2011-10-11 ENCOUNTER — Telehealth: Payer: Self-pay | Admitting: Cardiovascular Disease

## 2011-10-11 NOTE — Telephone Encounter (Signed)
[place]  Ok but ? More expensive

## 2011-10-11 NOTE — Telephone Encounter (Signed)
Pt calling stating she has just seen her PCP--(Karen Waller) who did EKG, took BP--149/90--and d/ced her coreg and losartan and put her on exforge--she would like to know if dr Eden Emms is ok with this--advised i would send message to dr Eden Emms and his nurse for review, and if any changes need to be made, they will call you--pt agrees

## 2011-10-11 NOTE — Telephone Encounter (Signed)
New Problem:    Patient has had a medication overhaul form her PCP and would like to verify if this was ok.  Please call back.

## 2011-10-11 NOTE — Telephone Encounter (Signed)
Spoke with pt and asked if exforge was more expensive than coreg and losartan--pt states they gave her coupons to get med--advised if exforge doesn't work for her or is too expensive, call us back and we can put her back on other meds--advised to try exforge for 1 month--pt agrees

## 2012-02-06 ENCOUNTER — Telehealth: Payer: Self-pay | Admitting: Cardiovascular Disease

## 2012-02-06 NOTE — Telephone Encounter (Signed)
New Problem: ° ° ° °I called the patient and was unable to reach them. I left a message on their voicemail with my name, the reason I called, the name of their physician, and a number to call back to schedule their appointment. ° °

## 2012-03-10 ENCOUNTER — Ambulatory Visit: Payer: BC Managed Care – PPO | Admitting: Cardiovascular Disease

## 2012-03-20 ENCOUNTER — Ambulatory Visit: Payer: BC Managed Care – PPO | Admitting: Cardiovascular Disease

## 2012-05-20 ENCOUNTER — Other Ambulatory Visit: Payer: Self-pay

## 2012-05-20 DIAGNOSIS — Z1231 Encounter for screening mammogram for malignant neoplasm of breast: Secondary | ICD-10-CM

## 2012-06-09 ENCOUNTER — Ambulatory Visit: Payer: BC Managed Care – PPO

## 2012-10-12 ENCOUNTER — Other Ambulatory Visit: Payer: Self-pay | Admitting: Family Medicine

## 2012-10-12 ENCOUNTER — Other Ambulatory Visit (HOSPITAL_COMMUNITY)
Admission: RE | Admit: 2012-10-12 | Discharge: 2012-10-12 | Disposition: A | Discharge status: Home / Self Care | Payer: BC Managed Care – PPO | Source: Ambulatory Visit | Attending: Family Medicine | Admitting: Family Medicine

## 2012-10-12 DIAGNOSIS — Z124 Encounter for screening for malignant neoplasm of cervix: Secondary | ICD-10-CM | POA: Insufficient documentation

## 2012-11-30 ENCOUNTER — Ambulatory Visit: Payer: BC Managed Care – PPO | Admitting: Cardiovascular Disease

## 2012-12-16 ENCOUNTER — Encounter: Payer: Self-pay | Admitting: Cardiovascular Disease

## 2012-12-16 ENCOUNTER — Ambulatory Visit (INDEPENDENT_AMBULATORY_CARE_PROVIDER_SITE_OTHER): Payer: BC Managed Care – PPO | Admitting: Cardiovascular Disease

## 2012-12-16 VITALS — BP 130/84 | HR 62 | Ht 66.0 in | Wt 190.0 lb

## 2012-12-16 DIAGNOSIS — I429 Cardiomyopathy, unspecified: Secondary | ICD-10-CM

## 2012-12-16 DIAGNOSIS — E785 Hyperlipidemia, unspecified: Secondary | ICD-10-CM

## 2012-12-16 DIAGNOSIS — I428 Other cardiomyopathies: Secondary | ICD-10-CM

## 2012-12-16 DIAGNOSIS — I509 Heart failure, unspecified: Secondary | ICD-10-CM

## 2012-12-16 NOTE — Assessment & Plan Note (Signed)
Stopped diuretic due to gout flairs. Apparantly has high uric acid levels In future if diuretic needed may need allopurinol added

## 2012-12-16 NOTE — Assessment & Plan Note (Signed)
Has fatigue that is not likely cardiac.  Over 2 years since EF assessed. With body habitus and poor echo images will order MRI with strain imaging and quantitate EF Patient not claustrophobic.  Continue ARB

## 2012-12-16 NOTE — Progress Notes (Signed)
Patient ID: Karen Waller, female   DOB: 1955/09/13, 57 y.o.   MRN: [id number] Karen Waller is seen today for DCM, HTN palpitations and elevated lipids. She sees Research scientist (life sciences) at [place] as her primary. She denies syncope, SSCP, dyspnea or edema. Her MRI 5/11 showed normal EF. Her EF had been as low as 35%. She is tolerating BB and ARB with no palpitations. Work at [employer] is [employer] well. She is seeing a dietician for initial Rx of her elevated lipids. Compliant with meds Taking HCTZ qod.   Reviewed echo from 2012  Mild LVE Mild diffuse hypokinesis EF 50% slightly worse in septum and apex  Images not particularly good With poor endocardial definition.  No biplane simpsons done   ROS: Denies fever, malais, weight loss, blurry vision, decreased visual acuity, cough, sputum, SOB, hemoptysis, pleuritic pain, palpitaitons, heartburn, abdominal pain, melena, lower extremity edema, claudication, or rash.  All other systems reviewed and negative  General: Affect appropriate Overweight black female HEENT: normal Neck supple with no adenopathy JVP normal no bruits no thyromegaly Lungs clear with no wheezing and good diaphragmatic motion Heart:  S1/S2 no murmur, no rub, gallop or click PMI normal Abdomen: benighn, BS positve, no tenderness, no AAA no bruit.  No HSM or HJR Distal pulses intact with no bruits No edema Neuro non-focal Skin warm and dry No muscular weakness   Current Outpatient Prescriptions  Medication Sig Dispense Refill  . amLODipine-valsartan (EXFORGE) 5-320 MG per tablet Take 1 tablet by mouth daily.      . metoprolol succinate (TOPROL-XL) 25 MG 24 hr tablet Take 25 mg by mouth daily.       . Multiple Vitamin (MULTIVITAMIN) capsule Take 1 capsule by mouth daily.        Marland Kitchen PROAIR HFA 108 (90 BASE) MCG/ACT inhaler Inhale 2 puffs into the lungs as needed.       . simvastatin (ZOCOR) 40 MG tablet Take 40 mg by mouth at bedtime.        No current facility-administered medications for  this visit.    Allergies  Sulfonamide derivatives  Electrocardiogram:  SR rate 62 RAD nonspecific ST/T wave changes   Assessment and Plan

## 2012-12-16 NOTE — Patient Instructions (Addendum)
Your physician wants you to follow-up in:   YEAR WITH  DR Haywood Filler will receive a reminder letter in the mail two months in advance. If you don't receive a letter, please call our office to schedule the follow-up appointment. Your physician recommends that you continue on your current medications as directed. Please refer to the Current Medication list given to you today.  Your physician has requested that you have a cardiac MRI. Cardiac MRI uses a computer to create images of your heart as its beating, producing both still and moving pictures of your heart and major blood vessels. For further information please visit [email or web address]. Please follow the instruction sheet given to you today for more information.  WITH  STRAIN ASSESS EF   MAY DO  ECHO WITH  STRAIN  IF MRI NOT  COVERED

## 2012-12-16 NOTE — Assessment & Plan Note (Signed)
Cholesterol is at goal.  Continue current dose of statin and diet Rx.  No myalgias or side effects.  F/U  LFT's in 6 months. No results found for this basename: LDLCALC  Labs done with primary at [facility]

## 2013-01-05 ENCOUNTER — Encounter: Payer: Self-pay | Admitting: Cardiovascular Disease

## 2013-01-07 ENCOUNTER — Ambulatory Visit (HOSPITAL_COMMUNITY): Payer: BC Managed Care – PPO

## 2013-01-13 ENCOUNTER — Ambulatory Visit (HOSPITAL_COMMUNITY)
Admission: RE | Admit: 2013-01-13 | Discharge: 2013-01-13 | Disposition: A | Discharge status: Home / Self Care | Payer: BC Managed Care – PPO | Source: Ambulatory Visit | Attending: Cardiovascular Disease | Admitting: Cardiovascular Disease

## 2013-01-13 DIAGNOSIS — I429 Cardiomyopathy, unspecified: Secondary | ICD-10-CM

## 2013-01-13 DIAGNOSIS — I428 Other cardiomyopathies: Secondary | ICD-10-CM | POA: Insufficient documentation

## 2013-01-13 LAB — CREATININE, SERUM
Creatinine, Ser: 1.26 mg/dL — ABNORMAL HIGH (ref 0.50–1.10)
GFR calc non Af Amer: 47 mL/min — ABNORMAL LOW (ref >90)

## 2013-01-13 MED ORDER — GADOBENATE DIMEGLUMINE 529 MG/ML IV SOLN
30.0000 mL | Freq: Once | INTRAVENOUS | Status: AC
  Administered 2013-01-13: 30 mL via INTRAVENOUS

## 2013-01-15 ENCOUNTER — Telehealth: Payer: Self-pay | Admitting: Cardiovascular Disease

## 2013-01-15 NOTE — Telephone Encounter (Signed)
Follow up    Pt returned you call give her a call back at work

## 2013-01-15 NOTE — Telephone Encounter (Signed)
PT  AWARE OF MRI RESULTS ./CY 

## 2014-01-17 ENCOUNTER — Other Ambulatory Visit: Payer: Self-pay

## 2014-01-17 DIAGNOSIS — Z1231 Encounter for screening mammogram for malignant neoplasm of breast: Secondary | ICD-10-CM

## 2014-01-18 ENCOUNTER — Ambulatory Visit
Admission: RE | Admit: 2014-01-18 | Discharge: 2014-01-18 | Disposition: A | Discharge status: Home / Self Care | Payer: BC Managed Care – PPO | Source: Ambulatory Visit

## 2014-01-18 DIAGNOSIS — Z1231 Encounter for screening mammogram for malignant neoplasm of breast: Secondary | ICD-10-CM

## 2014-02-10 NOTE — Progress Notes (Signed)
Patient ID: Karen Waller, female   DOB: 05-28-1955, 58 y.o.   MRN: [id number] Yana is seen today for DCM, HTN palpitations and elevated lipids. She sees Materials engineer at [place] as her primary. She denies syncope, SSCP, dyspnea or edema. Her MRI 5/11 showed normal EF. Her EF had been as low as 35%. She is tolerating BB and ARB with no palpitations. Work at [employer] is [employer] well. She is seeing a dietician for initial Rx of her elevated lipids. Compliant with meds  Diuretics can cause flairs in gout   Reviewed echo from 2012 Mild LVE Mild diffuse hypokinesis EF 50% slightly worse in septum and apex Images not particularly good With poor endocardial definition. No biplane simpsons done  MRI 01/13/13 reviewed EF 47%  FINDINGS: There was mild LAE. There was mild LVE. There was evidence for ventricular noncompaction. There was diffuse hypokinesis. The quantitative EF was 47% (EDV 151, ESV 80 SV 70) There were no discrete RWMA;s The AV, TV and MV were structurally normal. There was no ASD/VSD. There was no effusion. The RA and RV were normal in size The ascending aortic root was normal. Delayed enhancement images showed no scar or infarct.  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  3) Mild LAE  4) Normal RA and RV    ROS: Denies fever, malais, weight loss, blurry vision, decreased visual acuity, cough, sputum, SOB, hemoptysis, pleuritic pain, palpitaitons, heartburn, abdominal pain, melena, lower extremity edema, claudication, or rash.  All other systems reviewed and negative  General: Affect appropriate Overweight black female  HEENT: normal Neck supple with no adenopathy JVP normal no bruits no thyromegaly Lungs clear with no wheezing and good diaphragmatic motion Heart:  S1/S2 no murmur, no rub, gallop or click PMI normal Abdomen: benighn, BS positve, no tenderness, no AAA no bruit.  No  HSM or HJR Distal pulses intact with no bruits No edema Neuro non-focal Skin warm and dry No muscular weakness   Current Outpatient Prescriptions  Medication Sig Dispense Refill  . amLODipine-valsartan (EXFORGE) 5-320 MG per tablet Take 1 tablet by mouth daily.    . metoprolol succinate (TOPROL-XL) 25 MG 24 hr tablet Take 25 mg by mouth daily.     . Multiple Vitamin (MULTIVITAMIN) capsule Take 1 capsule by mouth daily.      Marland Kitchen PROAIR HFA 108 (90 BASE) MCG/ACT inhaler Inhale 2 puffs into the lungs as needed.     . simvastatin (ZOCOR) 40 MG tablet Take 40 mg by mouth at bedtime.      No current facility-administered medications for this visit.    Allergies  Sulfonamide derivatives  Electrocardiogram:  10/14  SR rate 62 RAD nonspecific ST/T wave changes  Today SR rate 70  With sinus arrhythmia no significant change   Assessment and Plan

## 2014-02-14 ENCOUNTER — Encounter: Payer: Self-pay | Admitting: Cardiovascular Disease

## 2014-02-14 ENCOUNTER — Ambulatory Visit (INDEPENDENT_AMBULATORY_CARE_PROVIDER_SITE_OTHER): Payer: BC Managed Care – PPO | Admitting: Cardiovascular Disease

## 2014-02-14 VITALS — BP 126/82 | HR 70 | Ht 66.0 in | Wt 192.0 lb

## 2014-02-14 DIAGNOSIS — I10 Essential (primary) hypertension: Secondary | ICD-10-CM

## 2014-02-14 DIAGNOSIS — E785 Hyperlipidemia, unspecified: Secondary | ICD-10-CM

## 2014-02-14 DIAGNOSIS — I428 Other cardiomyopathies: Secondary | ICD-10-CM

## 2014-02-14 DIAGNOSIS — I429 Cardiomyopathy, unspecified: Secondary | ICD-10-CM

## 2014-02-14 NOTE — Patient Instructions (Signed)
Your physician has requested that you have an echocardiogram. Echocardiography is a painless test that uses sound waves to create images of your heart. It provides your doctor with information about the size and shape of your heart and how well your heart's chambers and valves are working. This procedure takes approximately one hour. There are no restrictions for this procedure.  Your physician wants you to follow-up in: 1 Year with Dr. Johnsie Cancel. You will receive a reminder letter in the mail two months in advance. If you don't receive a letter, please call our office to schedule the follow-up appointment.

## 2014-02-14 NOTE — Assessment & Plan Note (Signed)
Cholesterol is at goal.  Continue current dose of statin and diet Rx.  No myalgias or side effects.  F/U  LFT's in 6 months. No results found for: LDLCALC           

## 2014-02-14 NOTE — Assessment & Plan Note (Signed)
Functional class one euvolemic continue current meds  F/U Echo for EF

## 2014-02-14 NOTE — Assessment & Plan Note (Signed)
Well controlled.  Continue current medications and low sodium Dash type diet.    

## 2014-02-16 ENCOUNTER — Ambulatory Visit (HOSPITAL_COMMUNITY): Payer: BC Managed Care – PPO | Attending: Cardiology | Admitting: Radiology

## 2014-02-16 DIAGNOSIS — I081 Rheumatic disorders of both mitral and tricuspid valves: Secondary | ICD-10-CM | POA: Diagnosis not present

## 2014-02-16 DIAGNOSIS — I429 Cardiomyopathy, unspecified: Secondary | ICD-10-CM

## 2014-02-16 DIAGNOSIS — E669 Obesity, unspecified: Secondary | ICD-10-CM | POA: Insufficient documentation

## 2014-02-16 DIAGNOSIS — R002 Palpitations: Secondary | ICD-10-CM | POA: Insufficient documentation

## 2014-02-16 DIAGNOSIS — I358 Other nonrheumatic aortic valve disorders: Secondary | ICD-10-CM | POA: Insufficient documentation

## 2014-02-16 DIAGNOSIS — I10 Essential (primary) hypertension: Secondary | ICD-10-CM | POA: Insufficient documentation

## 2014-02-16 DIAGNOSIS — I420 Dilated cardiomyopathy: Secondary | ICD-10-CM | POA: Insufficient documentation

## 2014-02-16 DIAGNOSIS — E785 Hyperlipidemia, unspecified: Secondary | ICD-10-CM | POA: Insufficient documentation

## 2014-02-16 NOTE — Progress Notes (Signed)
Echocardiogram performed.  

## 2014-02-23 ENCOUNTER — Telehealth: Payer: Self-pay | Admitting: Cardiovascular Disease

## 2014-02-23 DIAGNOSIS — I10 Essential (primary) hypertension: Secondary | ICD-10-CM

## 2014-02-23 MED ORDER — LOSARTAN POTASSIUM 25 MG PO TABS: 25.0000 mg | ORAL_TABLET | Freq: Every day | ORAL | Status: DC

## 2014-02-23 NOTE — Telephone Encounter (Signed)
New Message  Pt wants to know the status of her echocardiogram. Please call back and discuss.

## 2014-02-23 NOTE — Telephone Encounter (Signed)
Notified of echo results.  Will stop Amlodipine and will send new Rx Cozaar 25 mg daily to pharmacy.  Scheduled BMET for 1/26 and f/u with Dr. Johnsie Cancel 1/29. She verbalizes understanding.

## 2014-03-15 ENCOUNTER — Other Ambulatory Visit (INDEPENDENT_AMBULATORY_CARE_PROVIDER_SITE_OTHER): Payer: 59 | Admitting: *Deleted

## 2014-03-15 DIAGNOSIS — I10 Essential (primary) hypertension: Secondary | ICD-10-CM

## 2014-03-15 LAB — BASIC METABOLIC PANEL
BUN: 20 mg/dL (ref 6–23)
CO2: 24 mEq/L (ref 19–32)
CREATININE: 1.06 mg/dL (ref 0.40–1.20)
Calcium: 9 mg/dL (ref 8.4–10.5)
Chloride: 110 mEq/L (ref 96–112)
GFR: 68.53 mL/min (ref >60.00)
Glucose, Bld: 92 mg/dL (ref 70–99)
POTASSIUM: 4.3 meq/L (ref 3.5–5.1)
Sodium: 140 mEq/L (ref 135–145)

## 2014-03-16 ENCOUNTER — Other Ambulatory Visit: Payer: Self-pay

## 2014-03-18 ENCOUNTER — Encounter: Payer: Self-pay | Admitting: Cardiovascular Disease

## 2014-03-18 ENCOUNTER — Ambulatory Visit (INDEPENDENT_AMBULATORY_CARE_PROVIDER_SITE_OTHER): Payer: 59 | Admitting: Cardiovascular Disease

## 2014-03-18 VITALS — BP 156/104 | HR 82 | Ht 66.0 in | Wt 190.0 lb

## 2014-03-18 DIAGNOSIS — I428 Other cardiomyopathies: Secondary | ICD-10-CM

## 2014-03-18 DIAGNOSIS — I429 Cardiomyopathy, unspecified: Secondary | ICD-10-CM

## 2014-03-18 DIAGNOSIS — I10 Essential (primary) hypertension: Secondary | ICD-10-CM

## 2014-03-18 DIAGNOSIS — R0600 Dyspnea, unspecified: Secondary | ICD-10-CM

## 2014-03-18 DIAGNOSIS — Z79899 Other long term (current) drug therapy: Secondary | ICD-10-CM

## 2014-03-18 LAB — BASIC METABOLIC PANEL
BUN: 21 mg/dL (ref 6–23)
CALCIUM: 9 mg/dL (ref 8.4–10.5)
CO2: 27 mEq/L (ref 19–32)
Chloride: 112 mEq/L (ref 96–112)
Creatinine, Ser: 1.18 mg/dL (ref 0.40–1.20)
GFR: 60.55 mL/min (ref >60.00)
Glucose, Bld: 94 mg/dL (ref 70–99)
Potassium: 4 mEq/L (ref 3.5–5.1)
Sodium: 144 mEq/L (ref 135–145)

## 2014-03-18 LAB — BRAIN NATRIURETIC PEPTIDE: PRO B NATRI PEPTIDE: 56 pg/mL (ref 0.0–100.0)

## 2014-03-18 MED ORDER — CARVEDILOL 6.25 MG PO TABS: 6.2500 mg | ORAL_TABLET | Freq: Two times a day (BID) | ORAL | Status: DC

## 2014-03-18 MED ORDER — VALSARTAN 320 MG PO TABS: 320.0000 mg | ORAL_TABLET | Freq: Every day | ORAL | Status: DC

## 2014-03-18 NOTE — Patient Instructions (Addendum)
Your physician recommends that you schedule a follow-up appointment in: NEXT AVAILABLE WITH  DR [facility]  Your physician has recommended you make the following change in your medication:   STOP METOPROLOL AND LOSARTAN START CARVEDILOL  6.25 MG  TWICE DAILY  AND  VALSARTAN 320 MG EVERY DAY  Your physician recommends that you return for lab work in: TODAY  BMET  BNP

## 2014-03-18 NOTE — Assessment & Plan Note (Signed)
See med changes for DCM  Low sodium diet  Continue exercise program

## 2014-03-18 NOTE — Assessment & Plan Note (Signed)
She liked exforge and thought it controlled her BP  Will put her on the same dose of Valsartan 320 mg  And change beta blocker to coreg 6.25 bid  BMET and BNP today F/U MRI in 6 months f/u with me next available

## 2014-03-18 NOTE — Progress Notes (Signed)
Patient ID: Karen Waller, female   DOB: 04/06/1955, 59 y.o.   MRN: [id number] Karen Waller is seen today for DCM, HTN palpitations and elevated lipids. She sees Materials engineer at [place] as her primary. She denies syncope, SSCP, dyspnea or edema. Her MRI 5/11 showed normal EF. Her EF had been as low as 35%. She is tolerating BB and ARB with no palpitations. Work at [employer] is [employer] well. She is seeing a dietician for initial Rx of her elevated lipids. Compliant with meds  Diuretics can cause flairs in gout   Reviewed echo from 2012 Mild LVE Mild diffuse hypokinesis EF 50% slightly worse in septum and apex Images not particularly good With poor endocardial definition. No biplane simpsons done  MRI 01/13/13 reviewed EF 47%  FINDINGS: There was mild LAE. There was mild LVE. There was evidence for ventricular noncompaction. There was diffuse hypokinesis. The quantitative EF was 47% (EDV 151, ESV 80 SV 70) There were no discrete RWMA;s The AV, TV and MV were structurally normal. There was no ASD/VSD. There was no effusion. The RA and RV were normal in size The ascending aortic root was normal. Delayed enhancement images showed no scar or infarct.  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  3) Mild LAE  4) Normal RA and RV  Echo reviewed 02/16/14 Study Conclusions  - Left ventricle: The cavity size was normal. Systolic function was moderately reduced. The estimated ejection fraction was in the range of 35% to 40%. Diffuse hypokinesis. There was an increased relative contribution of atrial contraction to ventricular filling. Doppler parameters are consistent with abnormal left ventricular relaxation (grade 1 diastolic dysfunction). - Aortic valve: Trileaflet; normal thickness, mildly calcified leaflets. - Mitral valve: There was trivial regurgitation. - Tricuspid valve: There was mild  regurgitation.  We stopped her exforge and started her on cozaar and metoprolol She has not felt well since this change and BP not as well controlled. Thought was that calcium blocker not ideal With DCM  Has had fatigue and headaches with higher BP  Still working at [employer]   ROS: Denies fever, malais, weight loss, blurry vision, decreased visual acuity, cough, sputum, SOB, hemoptysis, pleuritic pain, palpitaitons, heartburn, abdominal pain, melena, lower extremity edema, claudication, or rash.  All other systems reviewed and negative  General: Affect appropriate Overweight black female  HEENT: normal Neck supple with no adenopathy JVP normal no bruits no thyromegaly Lungs clear with no wheezing and good diaphragmatic motion Heart:  S1/S2 no murmur, no rub, gallop or click PMI normal Abdomen: benighn, BS positve, no tenderness, no AAA no bruit.  No HSM or HJR Distal pulses intact with no bruits No edema Neuro non-focal Skin warm and dry No muscular weakness   Current Outpatient Prescriptions  Medication Sig Dispense Refill  . Cholecalciferol (VITAMIN D) 2000 UNITS tablet Take 2,000 Units by mouth 2 (two) times daily.    Marland Kitchen losartan (COZAAR) 25 MG tablet Take 1 tablet (25 mg total) by mouth daily. 30 tablet 11  . metoprolol succinate (TOPROL-XL) 25 MG 24 hr tablet Take 25 mg by mouth daily.     . Multiple Vitamin (MULTIVITAMIN) capsule Take 1 capsule by mouth daily.      Marland Kitchen PROAIR HFA 108 (90 BASE) MCG/ACT inhaler Inhale 2 puffs into the lungs as needed.     . simvastatin (ZOCOR) 20 MG tablet Take 20 mg by mouth daily.   5  No current facility-administered medications for this visit.    Allergies  Sulfa antibiotics and Sulfonamide derivatives  Electrocardiogram:  10/14  SR rate 62 RAD nonspecific ST/T wave changes  01/25/14 SR rate 70  With sinus arrhythmia no significant change   Assessment and Plan

## 2014-04-20 ENCOUNTER — Ambulatory Visit (INDEPENDENT_AMBULATORY_CARE_PROVIDER_SITE_OTHER): Payer: 59 | Admitting: Cardiovascular Disease

## 2014-04-20 ENCOUNTER — Encounter: Payer: Self-pay | Admitting: Cardiovascular Disease

## 2014-04-20 VITALS — BP 178/93 | HR 78 | Ht 66.0 in | Wt 192.0 lb

## 2014-04-20 DIAGNOSIS — M109 Gout, unspecified: Secondary | ICD-10-CM

## 2014-04-20 DIAGNOSIS — I10 Essential (primary) hypertension: Secondary | ICD-10-CM

## 2014-04-20 DIAGNOSIS — I429 Cardiomyopathy, unspecified: Secondary | ICD-10-CM

## 2014-04-20 DIAGNOSIS — I428 Other cardiomyopathies: Secondary | ICD-10-CM

## 2014-04-20 DIAGNOSIS — M1009 Idiopathic gout, multiple sites: Secondary | ICD-10-CM

## 2014-04-20 DIAGNOSIS — E785 Hyperlipidemia, unspecified: Secondary | ICD-10-CM

## 2014-04-20 MED ORDER — FUROSEMIDE 20 MG PO TABS: 10.0000 mg | ORAL_TABLET | Freq: Every day | ORAL | Status: DC

## 2014-04-20 NOTE — Assessment & Plan Note (Signed)
Cholesterol is at goal.  Continue current dose of statin and diet Rx.  No myalgias or side effects.  F/U  LFT's in 6 months. No results found for: [facility] Labs with primary

## 2014-04-20 NOTE — Patient Instructions (Addendum)
**Note De-Identified Karen Waller Obfuscation** Your physician has recommended you make the following change in your medication: start taking Furosemide 10 mg daily  Your physician recommends that you return for lab work in: today Artist)  Your physician recommends that you schedule a follow-up appointment in: 1st available

## 2014-04-20 NOTE — Assessment & Plan Note (Signed)
Functional class one adding diuretic  Stable f/u EF in a year

## 2014-04-20 NOTE — Progress Notes (Signed)
Patient ID: Karen Waller, female   DOB: [birth year], 59 y.o.   MRN: [id number] Trystan is seen today for DCM, HTN palpitations and elevated lipids. She sees Materials engineer at [place] as her primary. She denies syncope, SSCP, dyspnea or edema. Her MRI 5/11 showed normal EF. Her EF had been as low as 35%. She is tolerating BB and ARB with no palpitations. Work at [employer] is [employer] well. She is seeing a dietician for initial Rx of her elevated lipids. Compliant with meds  Diuretics can cause flairs in gout   Reviewed echo from 2012 Mild LVE Mild diffuse hypokinesis EF 50% slightly worse in septum and apex Images not particularly good With poor endocardial definition. No biplane simpsons done  MRI 01/13/13 reviewed EF 47%  FINDINGS: There was mild LAE. There was mild LVE. There was evidence for ventricular noncompaction. There was diffuse hypokinesis. The quantitative EF was 47% (EDV 151, ESV 80 SV 70) There were no discrete RWMA;s The AV, TV and MV were structurally normal. There was no ASD/VSD. There was no effusion. The RA and RV were normal in size The ascending aortic root was normal. Delayed enhancement images showed no scar or infarct.  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  3) Mild LAE  4) Normal RA and RV  Echo reviewed 02/16/14 Study Conclusions  - Left ventricle: The cavity size was normal. Systolic function was moderately reduced. The estimated ejection fraction was in the range of 35% to 40%. Diffuse hypokinesis. There was an increased relative contribution of atrial contraction to ventricular filling. Doppler parameters are consistent with abnormal left ventricular relaxation (grade 1 diastolic dysfunction). - Aortic valve: Trileaflet; normal thickness, mildly calcified leaflets. - Mitral valve: There was trivial regurgitation. - Tricuspid valve: There was mild  regurgitation.  We stopped her exforge and started her on cozaar and metoprolol She has not felt well since this change and BP not as well controlled. Thought was that calcium blocker not ideal With DCM  Has had fatigue and headaches with higher BP  Last visit changed back to valsartan and coreg  BP is high   ROS: Denies fever, malais, weight loss, blurry vision, decreased visual acuity, cough, sputum, SOB, hemoptysis, pleuritic pain, palpitaitons, heartburn, abdominal pain, melena, lower extremity edema, claudication, or rash.  All other systems reviewed and negative  General: Affect appropriate Overweight black female  HEENT: normal Neck supple with no adenopathy JVP normal no bruits no thyromegaly Lungs clear with no wheezing and good diaphragmatic motion Heart:  S1/S2 no murmur, no rub, gallop or click PMI normal Abdomen: benighn, BS positve, no tenderness, no AAA no bruit.  No HSM or HJR Distal pulses intact with no bruits No edema Neuro non-focal Skin warm and dry No muscular weakness   Current Outpatient Prescriptions  Medication Sig Dispense Refill  . carvedilol (COREG) 6.25 MG tablet Take 1 tablet (6.25 mg total) by mouth 2 (two) times daily. 60 tablet 11  . Cholecalciferol (VITAMIN D) 2000 UNITS tablet Take 2,000 Units by mouth 2 (two) times daily.    . Multiple Vitamin (MULTIVITAMIN) capsule Take 1 capsule by mouth daily.      Marland Kitchen PROAIR HFA 108 (90 BASE) MCG/ACT inhaler Inhale 2 puffs into the lungs as needed.     . simvastatin (ZOCOR) 20 MG tablet Take 20 mg by mouth daily.   5  . valsartan (DIOVAN) 320 MG tablet Take 1 tablet (320  mg total) by mouth daily. 30 tablet 11   No current facility-administered medications for this visit.    Allergies  Sulfa antibiotics and Sulfonamide derivatives  Electrocardiogram:  10/14  SR rate 62 RAD nonspecific ST/T wave changes  01/25/14 SR rate 70  With sinus arrhythmia no significant change   Assessment and Plan

## 2014-04-20 NOTE — Addendum Note (Signed)
**Note De-Identified Korben Carcione Obfuscation** Addended by: Dennie Fetters on: 04/20/2014 03:23 PM   Modules accepted: Orders

## 2014-04-20 NOTE — Assessment & Plan Note (Signed)
Last episode when on HCTZ  Will have to watch on lasix

## 2014-04-20 NOTE — Assessment & Plan Note (Signed)
Elevated  Has had gout with HCTZ before  Try lasix 10mg   F/u me next available  BMET in 2 weeks

## 2014-04-21 ENCOUNTER — Other Ambulatory Visit (INDEPENDENT_AMBULATORY_CARE_PROVIDER_SITE_OTHER): Payer: 59 | Admitting: *Deleted

## 2014-04-21 DIAGNOSIS — I10 Essential (primary) hypertension: Secondary | ICD-10-CM

## 2014-04-21 NOTE — Addendum Note (Signed)
Addended by: Eulis Foster on: 04/21/2014 04:28 PM   Modules accepted: Orders

## 2014-04-22 LAB — BASIC METABOLIC PANEL
BUN: 27 mg/dL — AB (ref 6–23)
CHLORIDE: 107 meq/L (ref 96–112)
CO2: 28 mEq/L (ref 19–32)
CREATININE: 1.11 mg/dL (ref 0.40–1.20)
Calcium: 9 mg/dL (ref 8.4–10.5)
GFR: 64.96 mL/min (ref >60.00)
Glucose, Bld: 104 mg/dL — ABNORMAL HIGH (ref 70–99)
Potassium: 4 mEq/L (ref 3.5–5.1)
Sodium: 141 mEq/L (ref 135–145)

## 2014-06-01 NOTE — Progress Notes (Signed)
Patient ID: Karen Waller, female   DOB: Jun 17, 1955, 59 y.o.   MRN: [id number] Karen Waller is seen today for DCM, HTN palpitations and elevated lipids. She sees Materials engineer at [place] as her primary. She denies syncope, SSCP, dyspnea or edema. Her MRI 5/11 showed normal EF. Her EF had been as low as 35%. She is tolerating BB and ARB with no palpitations. Work at [employer] is [employer] well. She is seeing a dietician for initial Rx of her elevated lipids. Compliant with meds  Diuretics can cause flairs in gout   Reviewed echo from 2012 Mild LVE Mild diffuse hypokinesis EF 50% slightly worse in septum and apex Images not particularly good With poor endocardial definition. No biplane simpsons done  MRI 01/13/13 reviewed EF 47%  FINDINGS: There was mild LAE. There was mild LVE. There was evidence for ventricular noncompaction. There was diffuse hypokinesis. The quantitative EF was 47% (EDV 151, ESV 80 SV 70) There were no discrete RWMA;s The AV, TV and MV were structurally normal. There was no ASD/VSD. There was no effusion. The RA and RV were normal in size The ascending aortic root was normal. Delayed enhancement images showed no scar or infarct.  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  3) Mild LAE  4) Normal RA and RV  Echo reviewed 02/16/14 Study Conclusions  - Left ventricle: The cavity size was normal. Systolic function was moderately reduced. The estimated ejection fraction was in the range of 35% to 40%. Diffuse hypokinesis. There was an increased relative contribution of atrial contraction to ventricular filling. Doppler parameters are consistent with abnormal left ventricular relaxation (grade 1 diastolic dysfunction). - Aortic valve: Trileaflet; normal thickness, mildly calcified leaflets. - Mitral valve: There was trivial regurgitation. - Tricuspid valve: There was mild  regurgitation.  We stopped her exforge and started her on cozaar and metoprolol She has not felt well since this change and BP not as well controlled. Thought was that calcium blocker not ideal With DCM  Has had fatigue and headaches with higher BP  Last visit changed back to valsartan and coreg  BP still high so lasix added.  She has had gout flare with HCTZ in past  BP still elevated discussed adding hydralazine today.  She is watching her salt but still overweight and sedentary.    BMET 04/21/14  K 4 BUN 27 Cr - normal   ROS: Denies fever, malais, weight loss, blurry vision, decreased visual acuity, cough, sputum, SOB, hemoptysis, pleuritic pain, palpitaitons, heartburn, abdominal pain, melena, lower extremity edema, claudication, or rash.  All other systems reviewed and negative  General: Affect appropriate Overweight black female  HEENT: normal Neck supple with no adenopathy JVP normal no bruits no thyromegaly Lungs clear with no wheezing and good diaphragmatic motion Heart:  S1/S2 no murmur, no rub, gallop or click PMI normal Abdomen: benighn, BS positve, no tenderness, no AAA no bruit.  No HSM or HJR Distal pulses intact with no bruits No edema Neuro non-focal Skin warm and dry No muscular weakness   Current Outpatient Prescriptions  Medication Sig Dispense Refill  . carvedilol (COREG) 6.25 MG tablet Take 1 tablet (6.25 mg total) by mouth 2 (two) times daily. 60 tablet 11  . Cholecalciferol (VITAMIN D) 2000 UNITS tablet Take 2,000 Units by mouth 2 (two) times daily.    . furosemide (LASIX) 20 MG tablet Take 0.5 tablets (10 mg total) by mouth daily. 15 tablet  6  . meloxicam (MOBIC) 15 MG tablet Take 15 mg by mouth daily.    . Multiple Vitamin (MULTIVITAMIN) capsule Take 1 capsule by mouth daily.      Marland Kitchen PROAIR HFA 108 (90 BASE) MCG/ACT inhaler Inhale 2 puffs into the lungs as needed.     . simvastatin (ZOCOR) 20 MG tablet Take 20 mg by mouth daily.   5  . valsartan (DIOVAN)  320 MG tablet Take 1 tablet (320 mg total) by mouth daily. 30 tablet 11   No current facility-administered medications for this visit.    Allergies  Sulfa antibiotics and Sulfonamide derivatives  Electrocardiogram:  10/14  SR rate 62 RAD nonspecific ST/T wave changes  01/25/14 SR rate 70  With sinus arrhythmia no significant change   Assessment and Plan

## 2014-06-02 ENCOUNTER — Ambulatory Visit (INDEPENDENT_AMBULATORY_CARE_PROVIDER_SITE_OTHER): Payer: 59 | Admitting: Cardiovascular Disease

## 2014-06-02 ENCOUNTER — Encounter: Payer: Self-pay | Admitting: Cardiovascular Disease

## 2014-06-02 VITALS — BP 140/100 | HR 74 | Ht 66.0 in | Wt 193.8 lb

## 2014-06-02 DIAGNOSIS — I10 Essential (primary) hypertension: Secondary | ICD-10-CM

## 2014-06-02 DIAGNOSIS — I429 Cardiomyopathy, unspecified: Secondary | ICD-10-CM

## 2014-06-02 DIAGNOSIS — R002 Palpitations: Secondary | ICD-10-CM

## 2014-06-02 DIAGNOSIS — I428 Other cardiomyopathies: Secondary | ICD-10-CM

## 2014-06-02 MED ORDER — HYDRALAZINE HCL 25 MG PO TABS: 25.0000 mg | ORAL_TABLET | Freq: Two times a day (BID) | ORAL | Status: DC

## 2014-06-02 NOTE — Patient Instructions (Signed)
Medication Instructions:  START  HYDRALAZINE 25 MG  TWICE DAILY   Labwork: NONE  Testing/Procedures: NONE  Follow-Up: 4 WEEKS WITH DR Johnsie Cancel  Any Other Special Instructions Will Be Listed Below (If Applicable).

## 2014-06-02 NOTE — Assessment & Plan Note (Signed)
Add hydralazine 25 bid f/u with me next available

## 2014-06-02 NOTE — Assessment & Plan Note (Signed)
Continue beta blocker May do better if we can control her BP If not consider event monitor

## 2014-06-02 NOTE — Assessment & Plan Note (Signed)
Euvolemic toleratign lasix with no gout flare  Trying to control BP better

## 2014-07-06 NOTE — Progress Notes (Signed)
Patient ID: APRIL COLTER, female   DOB: 1955/03/17, 59 y.o.   MRN: [id number] Korene is seen today for DCM, HTN palpitations and elevated lipids. She sees Materials engineer at [facility] as her primary. She denies syncope, SSCP, dyspnea or edema. Her MRI 5/11 showed normal EF. Her EF had been as low as 35%. She is tolerating BB and ARB with no palpitations. Work at [employer] is [employer] well. She is seeing a dietician for initial Rx of her elevated lipids. Compliant with meds  Diuretics can cause flairs in gout   Reviewed echo from 2012 Mild LVE Mild diffuse hypokinesis EF 50% slightly worse in septum and apex Images not particularly good With poor endocardial definition. No biplane simpsons done  MRI 01/13/13 reviewed EF 47%  FINDINGS: There was mild LAE. There was mild LVE. There was evidence for ventricular noncompaction. There was diffuse hypokinesis. The quantitative EF was 47% (EDV 151, ESV 80 SV 70) There were no discrete RWMA;s The AV, TV and MV were structurally normal. There was no ASD/VSD. There was no effusion. The RA and RV were normal in size The ascending aortic root was normal. Delayed enhancement images showed no scar or infarct.  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  3) Mild LAE  4) Normal RA and RV  Echo reviewed 02/16/14 Study Conclusions  - Left ventricle: The cavity size was normal. Systolic function was moderately reduced. The estimated ejection fraction was in the range of 35% to 40%. Diffuse hypokinesis. There was an increased relative contribution of atrial contraction to ventricular filling. Doppler parameters are consistent with abnormal left ventricular relaxation (grade 1 diastolic dysfunction). - Aortic valve: Trileaflet; normal thickness, mildly calcified leaflets. - Mitral valve: There was trivial regurgitation. - Tricuspid valve: There was mild  regurgitation.  We stopped her exforge and started her on cozaar and metoprolol She has not felt well since this change and BP not as well controlled. Thought was that calcium blocker not ideal With DCM  Has had fatigue and headaches with higher BP  Last visit changed back to valsartan and coreg  BP still high so lasix added.  She has had gout flare with HCTZ in past  BP still elevated discussed adding hydralazine today.  She is watching her salt but still overweight and sedentary. Walks with her [employer] a few times/week    BP still high and she is anxious about it.  Having more palpitations.    Lab Results  Component Value Date   NA 141 04/21/2014   K 4.0 04/21/2014   CL 107 04/21/2014   CO2 28 04/21/2014    ROS: Denies fever, malais, weight loss, blurry vision, decreased visual acuity, cough, sputum, SOB, hemoptysis, pleuritic pain, palpitaitons, heartburn, abdominal pain, melena, lower extremity edema, claudication, or rash.  All other systems reviewed and negative  General: Affect appropriate Overweight black female  HEENT: normal Neck supple with no adenopathy JVP normal no bruits no thyromegaly Lungs clear with no wheezing and good diaphragmatic motion Heart:  S1/S2 no murmur, no rub, gallop or click PMI normal Abdomen: benighn, BS positve, no tenderness, no AAA no bruit.  No HSM or HJR Distal pulses intact with no bruits No edema Neuro non-focal Skin warm and dry No muscular weakness   Current Outpatient Prescriptions  Medication Sig Dispense Refill  . carvedilol (COREG) 6.25 MG tablet Take 1 tablet (6.25 mg total) by mouth 2 (two)  times daily. 60 tablet 11  . Cholecalciferol (VITAMIN D) 2000 UNITS tablet Take 2,000 Units by mouth 2 (two) times daily.    . furosemide (LASIX) 20 MG tablet Take 0.5 tablets (10 mg total) by mouth daily. 15 tablet 6  . hydrALAZINE (APRESOLINE) 25 MG tablet Take 1 tablet (25 mg total) by mouth 2 (two) times daily. 180 tablet 3   . meloxicam (MOBIC) 15 MG tablet Take 15 mg by mouth daily.    . Multiple Vitamin (MULTIVITAMIN) capsule Take 1 capsule by mouth daily.      Marland Kitchen PROAIR HFA 108 (90 BASE) MCG/ACT inhaler Inhale 2 puffs into the lungs as needed.     . simvastatin (ZOCOR) 20 MG tablet Take 20 mg by mouth daily.   5  . valsartan (DIOVAN) 320 MG tablet Take 1 tablet (320 mg total) by mouth daily. 30 tablet 11   No current facility-administered medications for this visit.    Allergies  Sulfa antibiotics and Sulfonamide derivatives  Electrocardiogram:  10/14  SR rate 62 RAD nonspecific ST/T wave changes  01/25/14 SR rate 70  With sinus arrhythmia no significant change   Assessment and Plan HTN:  Add back norvasc 10 mg  Wrote out schedule for when to take meds.  Continue lifestyle changes f/u HTN clinic with pharmacy Palpitations:  Continue beta blocker may need event monitor if persists after BP controlled DCM:  Euvolemic continue current meds f/u EF evaluation in a year.  No clinical CHF  Jenkins Rouge

## 2014-07-07 ENCOUNTER — Ambulatory Visit (INDEPENDENT_AMBULATORY_CARE_PROVIDER_SITE_OTHER): Payer: 59 | Admitting: Cardiovascular Disease

## 2014-07-07 ENCOUNTER — Encounter: Payer: Self-pay | Admitting: Cardiovascular Disease

## 2014-07-07 VITALS — BP 160/82 | HR 71 | Ht 66.0 in | Wt 192.8 lb

## 2014-07-07 DIAGNOSIS — I10 Essential (primary) hypertension: Secondary | ICD-10-CM | POA: Diagnosis not present

## 2014-07-07 MED ORDER — AMLODIPINE BESYLATE 10 MG PO TABS: 10.0000 mg | ORAL_TABLET | Freq: Every day | ORAL | Status: DC

## 2014-07-07 NOTE — Patient Instructions (Signed)
Medication Instructions:  RESTART  AMLODIPINE   10 MG  EVERY DAY   Labwork: NONE  Testing/Procedures: NONE  Follow-Up:Your physician recommends that you schedule a follow-up appointment in: NEXT WEEK  WITH  B/P  CLINIC  AND  3  MONTHS WITH DR Johnsie Cancel  Any Other Special Instructions Will Be Listed Below (If Applicable).

## 2014-07-14 ENCOUNTER — Encounter: Payer: Self-pay | Admitting: Cardiovascular Disease

## 2014-07-14 ENCOUNTER — Ambulatory Visit (INDEPENDENT_AMBULATORY_CARE_PROVIDER_SITE_OTHER): Payer: 59

## 2014-07-14 VITALS — BP 128/82 | HR 76 | Ht 66.0 in | Wt 189.4 lb

## 2014-07-14 DIAGNOSIS — I10 Essential (primary) hypertension: Secondary | ICD-10-CM | POA: Diagnosis not present

## 2014-07-14 NOTE — Patient Instructions (Signed)
Medication Instructions:  Your physician recommends that you continue on your current medications as directed. Please refer to the Current Medication list given to you today.   Labwork: NONE  Testing/Procedures: NONE  Follow-Up: Keep schedule follow up appointment in August 2016 with Dr. Johnsie Cancel.  Any Other Special Instructions Will Be Listed Below (If Applicable).

## 2014-07-14 NOTE — Progress Notes (Signed)
1.) Reason for visit: BP Check  2.) Name of MD requesting visit: NISHAN  3.) ROS related to problem: Pt in today for a BP check after the addition of Amlodipine 10 mg to daily medication regimine.  She has no complaints of dizziness and the palpitations have decrease significantly. Pt has been check BP at home and it has been 803 systolic. Today BP was 128/82 with HR of 76.  Pt reminded of August appointment.  No questions at this time.

## 2014-08-17 ENCOUNTER — Telehealth: Payer: Self-pay | Admitting: Cardiovascular Disease

## 2014-08-17 NOTE — Telephone Encounter (Signed)
Pt called because she is concern that lately she is bruising easily. Pt would like to know if the medications she is taking now may be doing that. Pt was made aware that the medication that may   cause bruising would be Mobic. Pt said that she has not taken that medication for 2 months. Pt is aware  that she may needs to call her PCP to  See want may be causing the bruising. Pt verbalized understanding.

## 2014-08-17 NOTE — Telephone Encounter (Signed)
New Message    Pt calling stating that she is finding a lot of bruises and she doesn't know where they are coming from. Pt wants to know if this could be a side effect of one of her medications. Please call back and advise.

## 2014-10-09 NOTE — Progress Notes (Signed)
Patient ID: Karen Waller, female   DOB: 1955/09/08, 59 y.o.   MRN: [id number] Karen Waller is seen today for DCM, HTN palpitations and elevated lipids. She sees Materials engineer at [facility] as her primary. She denies syncope, SSCP, dyspnea or edema. Her MRI 5/11 showed normal EF. Her EF had been as low as 35%. She is tolerating BB and ARB with no palpitations. Work at [employer] is [employer] well. She is seeing a dietician for initial Rx of her elevated lipids. Compliant with meds  Diuretics can cause flairs in gout   Reviewed echo from 2012 Mild LVE Mild diffuse hypokinesis EF 50% slightly worse in septum and apex Images not particularly good With poor endocardial definition. No biplane simpsons done  MRI 01/13/13 reviewed EF 47%  FINDINGS: There was mild LAE. There was mild LVE. There was evidence for ventricular noncompaction. There was diffuse hypokinesis. The quantitative EF was 47% (EDV 151, ESV 80 SV 70) There were no discrete RWMA;s The AV, TV and MV were structurally normal. There was no ASD/VSD. There was no effusion. The RA and RV were normal in size The ascending aortic root was normal. Delayed enhancement images showed no scar or infarct.  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  3) Mild LAE  4) Normal RA and RV  Echo reviewed 02/16/14 Study Conclusions  - Left ventricle: The cavity size was normal. Systolic function was moderately reduced. The estimated ejection fraction was in the range of 35% to 40%. Diffuse hypokinesis. There was an increased relative contribution of atrial contraction to ventricular filling. Doppler parameters are consistent with abnormal left ventricular relaxation (grade 1 diastolic dysfunction). - Aortic valve: Trileaflet; normal thickness, mildly calcified leaflets. - Mitral valve: There was trivial regurgitation. - Tricuspid valve: There was mild  regurgitation.  We stopped her exforge and started her on cozaar and metoprolol She has not felt well since this change and BP not as well controlled. Thought was that calcium blocker not ideal With DCM  Has had fatigue and headaches with higher BP  Last visit changed back to valsartan and coreg  BP still high so lasix added.  She has had gout flare with HCTZ in past  BP still elevated discussed adding hydralazine today.  She is watching her salt but still overweight and sedentary. Walks with her [place] a few times/week    Last visit 5/16 norvasc added back for BP  Nurse visits BP ok   Some easy bruising   Lab Results  Component Value Date   NA 141 04/21/2014   K 4.0 04/21/2014   CL 107 04/21/2014   CO2 28 04/21/2014    ROS: Denies fever, malais, weight loss, blurry vision, decreased visual acuity, cough, sputum, SOB, hemoptysis, pleuritic pain, palpitaitons, heartburn, abdominal pain, melena, lower extremity edema, claudication, or rash.  All other systems reviewed and negative  General: Affect appropriate Overweight black female  HEENT: normal Neck supple with no adenopathy JVP normal no bruits no thyromegaly Lungs clear with no wheezing and good diaphragmatic motion Heart:  S1/S2 no murmur, no rub, gallop or click PMI normal Abdomen: benighn, BS positve, no tenderness, no AAA no bruit.  No HSM or HJR Distal pulses intact with no bruits No edema Neuro non-focal Skin warm and dry No muscular weakness   Current Outpatient Prescriptions  Medication Sig Dispense Refill  . amLODipine (NORVASC) 10 MG tablet Take 1 tablet (10 mg total)  by mouth daily. 30 tablet 11  . carvedilol (COREG) 6.25 MG tablet Take 1 tablet (6.25 mg total) by mouth 2 (two) times daily. 60 tablet 11  . Cholecalciferol (VITAMIN D) 2000 UNITS tablet Take 2,000 Units by mouth 2 (two) times daily.    . furosemide (LASIX) 20 MG tablet Take 0.5 tablets (10 mg total) by mouth daily. 15 tablet 6  .  hydrALAZINE (APRESOLINE) 25 MG tablet Take 1 tablet (25 mg total) by mouth 2 (two) times daily. 180 tablet 3  . meloxicam (MOBIC) 15 MG tablet Take 15 mg by mouth daily.    . Multiple Vitamin (MULTIVITAMIN) capsule Take 1 capsule by mouth daily.      Marland Kitchen PROAIR HFA 108 (90 BASE) MCG/ACT inhaler Inhale 2 puffs into the lungs as needed.     . simvastatin (ZOCOR) 20 MG tablet Take 20 mg by mouth daily.   5  . valsartan (DIOVAN) 320 MG tablet Take 1 tablet (320 mg total) by mouth daily. 30 tablet 11   No current facility-administered medications for this visit.    Allergies  Sulfa antibiotics and Sulfonamide derivatives  Electrocardiogram:  10/14  SR rate 62 RAD nonspecific ST/T wave changes  01/25/14 SR rate 70  With sinus arrhythmia no significant change   Assessment and Plan HTN:  Improved with norvasc 10 mg  Wrote out schedule for when to take meds.  Continue lifestyle changes f/u HTN clinic with pharmacy Palpitations:  Continue beta blocker may need event monitor if persists after BP controlled DCM:  Euvolemic continue current meds f/u EF evaluation echo 01/2015   No clinical CHF Bruising: may be from meloxicam  F/u Candice Smith at [facility]   Lab work drawn today including CBC/PLT ESR PT/PTT and TSH  [employer]

## 2014-10-10 ENCOUNTER — Encounter: Payer: Self-pay | Admitting: Cardiovascular Disease

## 2014-10-10 ENCOUNTER — Ambulatory Visit (INDEPENDENT_AMBULATORY_CARE_PROVIDER_SITE_OTHER): Payer: 59 | Admitting: Cardiovascular Disease

## 2014-10-10 VITALS — BP 138/78 | HR 72 | Ht 66.0 in | Wt 187.8 lb

## 2014-10-10 DIAGNOSIS — R233 Spontaneous ecchymoses: Secondary | ICD-10-CM

## 2014-10-10 DIAGNOSIS — I429 Cardiomyopathy, unspecified: Secondary | ICD-10-CM | POA: Diagnosis not present

## 2014-10-10 DIAGNOSIS — R238 Other skin changes: Secondary | ICD-10-CM | POA: Diagnosis not present

## 2014-10-10 NOTE — Patient Instructions (Signed)
Medication Instructions:  NONE  Labwork: TODAY   CBC  PT PTT SED RATE   TSH    Testing/Procedures: Your physician has requested that you have an echocardiogram. Echocardiography is a painless test that uses sound waves to create images of your heart. It provides your doctor with information about the size and shape of your heart and how well your heart's chambers and valves are working. This procedure takes approximately one hour. There are no restrictions for this procedure.  DUE IN DEC  SEE DR   Johnsie Cancel SAME DAY   Follow-Up: Your physician recommends that you schedule a follow-up appointment in:  DEC  WITH DR Johnsie Cancel ECHO SAME  DAY   Any Other Special Instructions Will Be Listed Below (If Applicable).

## 2014-10-11 LAB — CBC WITH DIFFERENTIAL/PLATELET
BASOS PCT: 0.4 % (ref 0.0–3.0)
Basophils Absolute: 0 10*3/uL (ref 0.0–0.1)
EOS PCT: 1.1 % (ref 0.0–5.0)
Eosinophils Absolute: 0.1 10*3/uL (ref 0.0–0.7)
HEMATOCRIT: 38 % (ref 36.0–46.0)
HEMOGLOBIN: 12.6 g/dL (ref 12.0–15.0)
Lymphocytes Relative: 38 % (ref 12.0–46.0)
Lymphs Abs: 1.9 10*3/uL (ref 0.7–4.0)
MCHC: 33.3 g/dL (ref 30.0–36.0)
MCV: 92.8 fl (ref 78.0–100.0)
MONO ABS: 0.3 10*3/uL (ref 0.1–1.0)
MONOS PCT: 6 % (ref 3.0–12.0)
Neutro Abs: 2.7 10*3/uL (ref 1.4–7.7)
Neutrophils Relative %: 54.5 % (ref 43.0–77.0)
Platelets: 225 10*3/uL (ref 150.0–400.0)
RBC: 4.09 Mil/uL (ref 3.87–5.11)
RDW: 14.5 % (ref 11.5–15.5)
WBC: 4.9 10*3/uL (ref 4.0–10.5)

## 2014-10-11 LAB — PROTIME-INR
INR: 1 ratio (ref 0.8–1.0)
Prothrombin Time: 11.3 s (ref 9.6–13.1)

## 2014-10-11 LAB — APTT: APTT: 36.3 s — AB (ref 23.4–32.7)

## 2014-10-11 LAB — SEDIMENTATION RATE: Sed Rate: 27 mm/hr — ABNORMAL HIGH (ref 0–22)

## 2014-10-11 LAB — TSH: TSH: 1.02 u[IU]/mL (ref 0.35–4.50)

## 2014-10-13 ENCOUNTER — Telehealth: Payer: Self-pay | Admitting: Cardiovascular Disease

## 2014-10-13 NOTE — Telephone Encounter (Signed)
Follow Up ° °Pt returned call//  °

## 2014-10-13 NOTE — Telephone Encounter (Signed)
LM TO CALL BACK ./CY 

## 2014-10-17 ENCOUNTER — Telehealth: Payer: Self-pay | Admitting: Cardiovascular Disease

## 2014-10-17 NOTE — Telephone Encounter (Signed)
SEE OTHER PHONE NOTE./CY 

## 2014-10-17 NOTE — Telephone Encounter (Signed)
New message ° ° ° ° °Returning a nurses call to get lab results °

## 2014-10-17 NOTE — Telephone Encounter (Signed)
PT AWARE OF LAB RESULTS./CY 

## 2014-10-19 ENCOUNTER — Telehealth: Payer: Self-pay | Admitting: Cardiovascular Disease

## 2014-10-19 NOTE — Telephone Encounter (Signed)
New message ° ° ° ° ° °Returning a nurses call °

## 2014-10-19 NOTE — Telephone Encounter (Signed)
SPOKE WITH  PT  MESSAGE HAS  BEEN ADDRESSED  LABS  RE FAXED TO  DR  Carol Ada .[facility]keeper

## 2014-11-19 ENCOUNTER — Other Ambulatory Visit: Payer: Self-pay | Admitting: Cardiovascular Disease

## 2015-01-19 ENCOUNTER — Encounter: Payer: Self-pay | Admitting: *Deleted

## 2015-01-20 NOTE — Progress Notes (Signed)
Patient ID: Karen Waller, female   DOB: 1955/05/12, 59 y.o.   MRN: [id number]   Bemnet is seen today for DCM, HTN palpitations and elevated lipids. She sees Materials engineer at [place] as her primary. She denies syncope, SSCP, dyspnea or edema. Her MRI 5/11 showed normal EF. Her EF had been as low as 35%. She is tolerating BB and ARB with no palpitations. Work at [employer] is [employer] well. She is seeing a dietician for initial Rx of her elevated lipids. Compliant with meds  Diuretics can cause flairs in gout   Reviewed echo from 2012 Mild LVE Mild diffuse hypokinesis EF 50% slightly worse in septum and apex Images not particularly good With poor endocardial definition. No biplane simpsons done  MRI 01/13/13 reviewed EF 47%  FINDINGS: There was mild LAE. There was mild LVE. There was evidence for ventricular noncompaction. There was diffuse hypokinesis. The quantitative EF was 47% (EDV 151, ESV 80 SV 70) There were no discrete RWMA;s The AV, TV and MV were structurally normal. There was no ASD/VSD. There was no effusion. The RA and RV were normal in size The ascending aortic root was normal. Delayed enhancement images showed no scar or infarct.  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  3) Mild LAE  4) Normal RA and RV  Echo 01/25/15 reviewed  EF 50-55% mild LVE trivial MR  We stopped her exforge and started her on cozaar and metoprolol She has not felt well since this change and BP not as well controlled. Thought was that calcium blocker not ideal With DCM  Has had fatigue and headaches with higher BP  Last visit changed back to valsartan and coreg  BP still high so lasix added.  She has had gout flare with HCTZ in past  BP still elevated discussed adding hydralazine today.  She is watching her salt but still overweight and sedentary. Walks with her [place] a few times/week    Last visit  5/16 norvasc added back for BP  Nurse visits BP ok   Some easy bruising   Lab Results  Component Value Date   NA 141 04/21/2014   K 4.0 04/21/2014   CL 107 04/21/2014   CO2 28 04/21/2014    ROS: Denies fever, malais, weight loss, blurry vision, decreased visual acuity, cough, sputum, SOB, hemoptysis, pleuritic pain, palpitaitons, heartburn, abdominal pain, melena, lower extremity edema, claudication, or rash.  All other systems reviewed and negative  General: Affect appropriate Overweight black female  HEENT: normal Neck supple with no adenopathy JVP normal no bruits no thyromegaly Lungs clear with no wheezing and good diaphragmatic motion Heart:  S1/S2 no murmur, no rub, gallop or click PMI normal Abdomen: benighn, BS positve, no tenderness, no AAA no bruit.  No HSM or HJR Distal pulses intact with no bruits No edema Neuro non-focal Skin warm and dry No muscular weakness   Current Outpatient Prescriptions  Medication Sig Dispense Refill  . amLODipine (NORVASC) 10 MG tablet Take 1 tablet (10 mg total) by mouth daily. 30 tablet 11  . carvedilol (COREG) 6.25 MG tablet Take 1 tablet (6.25 mg total) by mouth 2 (two) times daily. 60 tablet 11  . Cholecalciferol (VITAMIN D) 2000 UNITS tablet Take 2,000 Units by mouth 2 (two) times daily.    . furosemide (LASIX) 20 MG tablet TAKE 1/2 TABLET BY MOUTH DAILY 15 tablet 3  . hydrALAZINE (APRESOLINE) 25 MG  tablet Take 1 tablet (25 mg total) by mouth 2 (two) times daily. 180 tablet 3  . meloxicam (MOBIC) 15 MG tablet Take 15 mg by mouth daily as needed for pain.     . Multiple Vitamin (MULTIVITAMIN) capsule Take 1 capsule by mouth daily.      Marland Kitchen PROAIR HFA 108 (90 BASE) MCG/ACT inhaler Inhale 2 puffs into the lungs as needed for wheezing or shortness of breath.     . simvastatin (ZOCOR) 20 MG tablet Take 20 mg by mouth daily.   5  . valsartan (DIOVAN) 320 MG tablet Take 1 tablet (320 mg total) by mouth daily. 30 tablet 11   No current  facility-administered medications for this visit.   Facility-Administered Medications Ordered in Other Visits  Medication Dose Route Frequency Provider Last Rate Last Dose  . perflutren lipid microspheres (DEFINITY) IV suspension  1-10 mL Intravenous PRN Josue Hector, MD   2 mL at 01/25/15 6286    Allergies  Sulfa antibiotics  Electrocardiogram:  10/14  SR rate 62 RAD nonspecific ST/T wave changes  01/25/14 SR rate 70  With sinus arrhythmia no significant change  01/25/15 SR ratre 62  Nonspecific ST changes   Assessment and Plan HTN:  Improved with norvasc 10 mg  Wrote out schedule for when to take meds.  Continue lifestyle changes f/u HTN clinic with pharmacy Palpitations:  Continue beta blocker may need event monitor if persists after BP controlled DCM:  Euvolemic continue current meds echo 01/25/15  EF 50-55% No clinical CHF Bruising: may be from meloxicam  F/u Candice Smith at [place]   Lab work drawn today including CBC/PLT ESR PT/PTT and TSH  [employer]

## 2015-01-24 ENCOUNTER — Encounter: Payer: Self-pay | Admitting: Cardiovascular Disease

## 2015-01-25 ENCOUNTER — Other Ambulatory Visit: Payer: Self-pay

## 2015-01-25 ENCOUNTER — Ambulatory Visit (HOSPITAL_COMMUNITY): Payer: 59 | Attending: Cardiovascular Disease

## 2015-01-25 ENCOUNTER — Encounter: Payer: Self-pay | Admitting: Cardiovascular Disease

## 2015-01-25 ENCOUNTER — Ambulatory Visit (INDEPENDENT_AMBULATORY_CARE_PROVIDER_SITE_OTHER): Payer: 59 | Admitting: Cardiovascular Disease

## 2015-01-25 VITALS — BP 150/80 | HR 62 | Ht 66.0 in | Wt 188.1 lb

## 2015-01-25 DIAGNOSIS — E785 Hyperlipidemia, unspecified: Secondary | ICD-10-CM | POA: Diagnosis not present

## 2015-01-25 DIAGNOSIS — I429 Cardiomyopathy, unspecified: Secondary | ICD-10-CM | POA: Insufficient documentation

## 2015-01-25 DIAGNOSIS — I517 Cardiomegaly: Secondary | ICD-10-CM | POA: Insufficient documentation

## 2015-01-25 DIAGNOSIS — I10 Essential (primary) hypertension: Secondary | ICD-10-CM

## 2015-01-25 DIAGNOSIS — Z1231 Encounter for screening mammogram for malignant neoplasm of breast: Secondary | ICD-10-CM

## 2015-01-25 MED ORDER — PERFLUTREN LIPID MICROSPHERE
1.0000 mL | INTRAVENOUS | Status: AC | PRN
  Administered 2015-01-25: 2 mL via INTRAVENOUS

## 2015-01-25 NOTE — Patient Instructions (Addendum)

## 2015-02-17 ENCOUNTER — Ambulatory Visit
Admission: RE | Admit: 2015-02-17 | Discharge: 2015-02-17 | Disposition: A | Discharge status: Home / Self Care | Payer: 59 | Source: Ambulatory Visit

## 2015-02-17 DIAGNOSIS — Z1231 Encounter for screening mammogram for malignant neoplasm of breast: Secondary | ICD-10-CM

## 2015-03-31 ENCOUNTER — Other Ambulatory Visit: Payer: Self-pay | Admitting: Cardiovascular Disease

## 2015-04-06 ENCOUNTER — Other Ambulatory Visit: Payer: Self-pay | Admitting: Cardiovascular Disease

## 2015-04-12 ENCOUNTER — Other Ambulatory Visit: Payer: Self-pay | Admitting: Cardiovascular Disease

## 2015-06-07 ENCOUNTER — Other Ambulatory Visit: Payer: Self-pay | Admitting: Cardiovascular Disease

## 2015-07-20 ENCOUNTER — Other Ambulatory Visit: Payer: Self-pay | Admitting: Cardiovascular Disease

## 2015-07-24 ENCOUNTER — Ambulatory Visit: Payer: 59 | Admitting: Cardiovascular Disease

## 2015-08-08 NOTE — Progress Notes (Signed)
Patient ID: Karen Waller, female   DOB: [birth year], 60 y.o.   MRN: [id number]   Makeesha is seen today for DCM, HTN palpitations and elevated lipids. She sees Materials engineer at [place] as her primary. She denies syncope, SSCP, dyspnea or edema. Her MRI 5/11 showed normal EF. Her EF had been as low as 35%. She is tolerating BB and ARB with no palpitations. Work at [employer] is [employer] well. She is seeing a dietician for initial Rx of her elevated lipids. Compliant with meds  Diuretics can cause flairs in gout   Reviewed echo from 2012 Mild LVE Mild diffuse hypokinesis EF 50% slightly worse in septum and apex Images not particularly good With poor endocardial definition. No biplane simpsons done  MRI 01/13/13 reviewed EF 47%  FINDINGS: There was mild LAE. There was mild LVE. There was evidence for ventricular noncompaction. There was diffuse hypokinesis. The quantitative EF was 47% (EDV 151, ESV 80 SV 70) There were no discrete RWMA;s The AV, TV and MV were structurally normal. There was no ASD/VSD. There was no effusion. The RA and RV were normal in size The ascending aortic root was normal. Delayed enhancement images showed no scar or infarct.  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  3) Mild LAE  4) Normal RA and RV  Echo 01/25/15 reviewed  EF 50-55% mild LVE trivial MR  Cozaar and metoprolol did not do as well as current regimen HCTZ caused more gout than Lasix Norvasc added back last visit   She is watching her salt but still overweight and sedentary. Walks with her [employer] a few times/week     Some easy bruising   Lab Results  Component Value Date   NA 141 04/21/2014   K 4.0 04/21/2014   CL 107 04/21/2014   CO2 28 04/21/2014    ROS: Denies fever, malais, weight loss, blurry vision, decreased visual acuity, cough, sputum, SOB, hemoptysis, pleuritic pain, palpitaitons,  heartburn, abdominal pain, melena, lower extremity edema, claudication, or rash.  All other systems reviewed and negative  General: Affect appropriate Overweight black female  HEENT: normal Neck supple with no adenopathy JVP normal no bruits no thyromegaly Lungs clear with no wheezing and good diaphragmatic motion Heart:  S1/S2 no murmur, no rub, gallop or click PMI normal Abdomen: benighn, BS positve, no tenderness, no AAA no bruit.  No HSM or HJR Distal pulses intact with no bruits No edema Neuro non-focal Skin warm and dry No muscular weakness   Current Outpatient Prescriptions  Medication Sig Dispense Refill  . amLODipine (NORVASC) 10 MG tablet TAKE 1 TABLET(10 MG) BY MOUTH DAILY 30 tablet 5  . carvedilol (COREG) 6.25 MG tablet TAKE 1 TABLET BY MOUTH TWICE DAILY 60 tablet 4  . Cholecalciferol (VITAMIN D3) 2000 units capsule Take 2,000 Units by mouth daily.    . furosemide (LASIX) 20 MG tablet TAKE 1/2 TABLET BY MOUTH DAILY 15 tablet 9  . hydrALAZINE (APRESOLINE) 25 MG tablet TAKE 1 TABLET(25 MG) BY MOUTH TWICE DAILY 180 tablet 2  . meloxicam (MOBIC) 15 MG tablet Take 15 mg by mouth daily as needed for pain.     . Multiple Vitamin (MULTIVITAMIN) capsule Take 1 capsule by mouth daily.      . simvastatin (ZOCOR) 20 MG tablet Take 20 mg by mouth daily.   5  . valsartan (DIOVAN) 320 MG tablet TAKE 1 TABLET BY MOUTH EVERY DAY  30 tablet 10   No current facility-administered medications for this visit.    Allergies  Sulfa antibiotics  Electrocardiogram:  10/14  SR rate 62 RAD nonspecific ST/T wave changes  01/25/14 SR rate 70  With sinus arrhythmia no significant change  01/25/15  SR ratre 62  Nonspecific ST changes   Assessment and Plan HTN:  Improved with norvasc 10 mg  Wrote out schedule for when to take meds.  Continue lifestyle changes  Palpitations:  Continue beta blocker may need event monitor if persists after BP controlled DCM:  Euvolemic continue current meds echo  01/25/15  EF 50-55% No clinical CHF f/u echo in December Bruising: may be from meloxicam  F/u Candice Smith at [employer] work ok including CBC/PLT and TSH PT all normal  [employer]

## 2015-08-10 ENCOUNTER — Encounter: Payer: Self-pay | Admitting: Cardiovascular Disease

## 2015-08-10 ENCOUNTER — Ambulatory Visit (INDEPENDENT_AMBULATORY_CARE_PROVIDER_SITE_OTHER): Payer: 59 | Admitting: Cardiovascular Disease

## 2015-08-10 ENCOUNTER — Ambulatory Visit: Payer: 59 | Admitting: Cardiology

## 2015-08-10 VITALS — BP 120/80 | HR 85 | Ht 66.0 in | Wt 195.4 lb

## 2015-08-10 DIAGNOSIS — I429 Cardiomyopathy, unspecified: Secondary | ICD-10-CM | POA: Diagnosis not present

## 2015-08-10 NOTE — Patient Instructions (Addendum)
Medication Instructions:  Your physician recommends that you continue on your current medications as directed. Please refer to the Current Medication list given to you today.  Labwork: NONE  Testing/Procedures: Your physician has requested that you have an echocardiogram in December. Echocardiography is a painless test that uses sound waves to create images of your heart. It provides your doctor with information about the size and shape of your heart and how well your heart's chambers and valves are working. This procedure takes approximately one hour. There are no restrictions for this procedure.    Follow-Up: Your physician wants you to follow-up in: 6 months with Dr. Johnsie Cancel. You will receive a reminder letter in the mail two months in advance. If you don't receive a letter, please call our office to schedule the follow-up appointment.   If you need a refill on your cardiac medications before your next appointment, please call your pharmacy.

## 2015-09-22 ENCOUNTER — Other Ambulatory Visit: Payer: Self-pay | Admitting: Cardiovascular Disease

## 2016-02-08 ENCOUNTER — Ambulatory Visit (HOSPITAL_COMMUNITY): Payer: 59 | Attending: Internal Medicine

## 2016-02-08 ENCOUNTER — Other Ambulatory Visit: Payer: Self-pay

## 2016-02-08 ENCOUNTER — Other Ambulatory Visit: Payer: Self-pay | Admitting: Cardiovascular Disease

## 2016-02-08 DIAGNOSIS — I429 Cardiomyopathy, unspecified: Secondary | ICD-10-CM | POA: Insufficient documentation

## 2016-02-08 DIAGNOSIS — I517 Cardiomegaly: Secondary | ICD-10-CM | POA: Diagnosis not present

## 2016-02-08 DIAGNOSIS — I361 Nonrheumatic tricuspid (valve) insufficiency: Secondary | ICD-10-CM | POA: Diagnosis not present

## 2016-02-08 DIAGNOSIS — I340 Nonrheumatic mitral (valve) insufficiency: Secondary | ICD-10-CM | POA: Insufficient documentation

## 2016-02-14 ENCOUNTER — Other Ambulatory Visit: Payer: Self-pay | Admitting: Family Medicine

## 2016-02-14 DIAGNOSIS — Z1231 Encounter for screening mammogram for malignant neoplasm of breast: Secondary | ICD-10-CM

## 2016-02-19 ENCOUNTER — Other Ambulatory Visit: Payer: Self-pay | Admitting: Cardiovascular Disease

## 2016-02-20 ENCOUNTER — Other Ambulatory Visit (HOSPITAL_COMMUNITY): Payer: 59

## 2016-02-21 ENCOUNTER — Other Ambulatory Visit (HOSPITAL_COMMUNITY): Payer: 59

## 2016-03-12 ENCOUNTER — Ambulatory Visit
Admission: RE | Admit: 2016-03-12 | Discharge: 2016-03-12 | Disposition: A | Discharge status: Home / Self Care | Payer: 59 | Source: Ambulatory Visit | Attending: Family Medicine | Admitting: Family Medicine

## 2016-03-12 DIAGNOSIS — Z1231 Encounter for screening mammogram for malignant neoplasm of breast: Secondary | ICD-10-CM | POA: Diagnosis not present

## 2016-03-17 ENCOUNTER — Other Ambulatory Visit (INDEPENDENT_AMBULATORY_CARE_PROVIDER_SITE_OTHER): Payer: Self-pay | Admitting: Orthopaedic Surgery

## 2016-03-20 ENCOUNTER — Other Ambulatory Visit: Payer: Self-pay | Admitting: Cardiovascular Disease

## 2016-03-21 ENCOUNTER — Ambulatory Visit (INDEPENDENT_AMBULATORY_CARE_PROVIDER_SITE_OTHER): Payer: 59 | Admitting: Orthopaedic Surgery

## 2016-03-21 ENCOUNTER — Ambulatory Visit (INDEPENDENT_AMBULATORY_CARE_PROVIDER_SITE_OTHER): Payer: 59

## 2016-03-21 ENCOUNTER — Encounter (INDEPENDENT_AMBULATORY_CARE_PROVIDER_SITE_OTHER): Payer: Self-pay | Admitting: Orthopaedic Surgery

## 2016-03-21 DIAGNOSIS — M25571 Pain in right ankle and joints of right foot: Secondary | ICD-10-CM

## 2016-03-21 DIAGNOSIS — M79604 Pain in right leg: Secondary | ICD-10-CM

## 2016-03-21 NOTE — Progress Notes (Signed)
Office Visit Note   Patient: Karen Waller           Date of Birth: 08/15/1955           MRN: [id number] Visit Date: 03/21/2016              Requested by: Carol Ada, MD [place], [place] [address] PCP: Reginia Naas, MD   Assessment & Plan: Visit Diagnoses:  1. Pain in right ankle and joints of right foot   2. Pain in right leg     Plan: I think the patient has some sort of tendinitis of anterior compartment. I recommend TED hose for compression in her pitting edema.Elevation and ice as needed follow up with me as needed.  Follow-Up Instructions: Return if symptoms worsen or fail to improve.   Orders:  Orders Placed This Encounter  Procedures  . XR Ankle Complete Right  . XR Tibia/Fibula Right   No orders of the defined types were placed in this encounter.     Procedures: No procedures performed   Clinical Data: No additional findings.   Subjective: Chief Complaint  Patient presents with  . Right Leg - Pain  . Right Ankle - Pain    Patient is 61 year old female with f right shin pain. This feels tender and slightly looks discolored. The pain radiates down into her ankle and is worse with ankle dorsiflexion. It is getting better overall. The pain is mild.she has not taken any medicines. She denies paresthesias    Review of Systems Complete review of systems is negative except for history of present illness  Objective: Vital Signs: There were no vitals taken for this visit.  Physical Exam  Constitutional: She is oriented to person, place, and time. She appears well-developed and well-nourished.  HENT:  Head: Normocephalic and atraumatic.  Eyes: EOM are normal.  Neck: Neck supple.  Pulmonary/Chest: Effort normal.  Abdominal: Soft.  Neurological: She is alert and oriented to person, place, and time.  Skin: Skin is warm. Capillary refill takes less than 2 seconds.  Psychiatric: She has a normal mood and affect. Her  behavior is normal. Judgment and thought content normal.  Nursing note and vitals reviewed.   Ortho Exam Exam of the right lower extremity shows no swelling or real tenderness palpat across the anterior tibia. She has some vague discomfort with ankle dorsiflexion. Specialty Comments:  No specialty comments available.  Imaging: Xr Ankle Complete Right  Result Date: 03/21/2016 No acute findings were structure abnormalities  Xr Tibia/fibula Right  Result Date: 03/21/2016 o structural abnormalities    PMFS History: Patient Active Problem List   Diagnosis Date Noted  . Elevated lipids 12/16/2012  . CHF (congestive heart failure) ([facility]) 10/30/2010  . Edema 10/30/2010  . Gouty arthropathy 03/17/2008  . OVERWEIGHT 03/17/2008  . ESSENTIAL HYPERTENSION, BENIGN 03/17/2008  . Non-ischemic cardiomyopathy ([facility]) 03/17/2008  . PALPITATIONS, OCCASIONAL 03/17/2008   Past Medical History:  Diagnosis Date  . Cardiomyopathy    EF 30-35% diagnosed 02/2008  . Central obesity   . Gout    left knee  . HTN (hypertension)     Family History  Problem Relation Age of Onset  . Hypertension Mother     Past Surgical History:  Procedure Laterality Date  . KNEE SURGERY    . TONSILLECTOMY     Social History   Occupational History  . [employer]    Social History Main Topics  . Smoking status: Former Smoker  Quit date: 02/19/1984  . Smokeless tobacco: Never Used  . Alcohol use Yes     Comment: occasional  . Drug use: Unknown  . Sexual activity: Not on file

## 2016-04-17 ENCOUNTER — Other Ambulatory Visit: Payer: Self-pay | Admitting: Cardiovascular Disease

## 2016-05-07 ENCOUNTER — Other Ambulatory Visit (HOSPITAL_COMMUNITY)
Admission: RE | Admit: 2016-05-07 | Discharge: 2016-05-07 | Disposition: A | Discharge status: Home / Self Care | Payer: 59 | Source: Ambulatory Visit | Attending: Family Medicine | Admitting: Family Medicine

## 2016-05-07 ENCOUNTER — Other Ambulatory Visit: Payer: Self-pay | Admitting: Family Medicine

## 2016-05-07 DIAGNOSIS — Z01419 Encounter for gynecological examination (general) (routine) without abnormal findings: Secondary | ICD-10-CM | POA: Diagnosis not present

## 2016-05-08 DIAGNOSIS — E782 Mixed hyperlipidemia: Secondary | ICD-10-CM | POA: Diagnosis not present

## 2016-05-08 DIAGNOSIS — I10 Essential (primary) hypertension: Secondary | ICD-10-CM | POA: Diagnosis not present

## 2016-05-08 DIAGNOSIS — Z0000 Encounter for general adult medical examination without abnormal findings: Secondary | ICD-10-CM | POA: Diagnosis not present

## 2016-05-08 DIAGNOSIS — E559 Vitamin D deficiency, unspecified: Secondary | ICD-10-CM | POA: Diagnosis not present

## 2016-05-09 LAB — CYTOLOGY - PAP: DIAGNOSIS: NEGATIVE

## 2016-05-28 ENCOUNTER — Other Ambulatory Visit: Payer: Self-pay | Admitting: Cardiovascular Disease

## 2016-06-28 ENCOUNTER — Other Ambulatory Visit: Payer: Self-pay | Admitting: Cardiovascular Disease

## 2016-07-25 ENCOUNTER — Other Ambulatory Visit: Payer: Self-pay | Admitting: Cardiovascular Disease

## 2016-08-01 ENCOUNTER — Other Ambulatory Visit: Payer: Self-pay | Admitting: Cardiovascular Disease

## 2016-08-02 ENCOUNTER — Other Ambulatory Visit: Payer: Self-pay | Admitting: Cardiovascular Disease

## 2016-08-07 DIAGNOSIS — R238 Other skin changes: Secondary | ICD-10-CM | POA: Diagnosis not present

## 2016-08-08 ENCOUNTER — Other Ambulatory Visit: Payer: Self-pay

## 2016-08-08 DIAGNOSIS — L299 Pruritus, unspecified: Secondary | ICD-10-CM | POA: Diagnosis not present

## 2016-08-08 DIAGNOSIS — H6123 Impacted cerumen, bilateral: Secondary | ICD-10-CM | POA: Diagnosis not present

## 2016-08-08 DIAGNOSIS — H938X3 Other specified disorders of ear, bilateral: Secondary | ICD-10-CM | POA: Diagnosis not present

## 2016-08-08 DIAGNOSIS — H900 Conductive hearing loss, bilateral: Secondary | ICD-10-CM | POA: Diagnosis not present

## 2016-08-08 MED ORDER — FUROSEMIDE 20 MG PO TABS: 10.0000 mg | ORAL_TABLET | Freq: Every day | ORAL | 1 refills | Status: DC

## 2016-08-08 MED ORDER — CARVEDILOL 6.25 MG PO TABS: 6.2500 mg | ORAL_TABLET | Freq: Two times a day (BID) | ORAL | 1 refills | Status: DC

## 2016-08-08 MED ORDER — AMLODIPINE BESYLATE 10 MG PO TABS: 10.0000 mg | ORAL_TABLET | Freq: Every day | ORAL | 1 refills | Status: DC

## 2016-08-08 MED ORDER — HYDRALAZINE HCL 25 MG PO TABS: 25.0000 mg | ORAL_TABLET | Freq: Two times a day (BID) | ORAL | 1 refills | Status: DC

## 2016-08-14 ENCOUNTER — Other Ambulatory Visit: Payer: Self-pay | Admitting: Cardiovascular Disease

## 2016-08-31 ENCOUNTER — Other Ambulatory Visit: Payer: Self-pay | Admitting: Cardiovascular Disease

## 2016-09-01 ENCOUNTER — Other Ambulatory Visit: Payer: Self-pay | Admitting: Cardiovascular Disease

## 2016-09-05 DIAGNOSIS — K2950 Unspecified chronic gastritis without bleeding: Secondary | ICD-10-CM | POA: Diagnosis not present

## 2016-09-06 MED ORDER — CANDESARTAN CILEXETIL 16 MG PO TABS: 16.0000 mg | ORAL_TABLET | Freq: Every day | ORAL | 0 refills | Status: DC

## 2016-09-06 NOTE — Telephone Encounter (Signed)
Pt called about valsartan recall. She reports that losartan did not work for her. Will send candesartan 16mg  daily and have her monitor pressures over next few weeks and call with concerns. She will also call if medication is cost-prohibitive for her.

## 2016-09-23 ENCOUNTER — Encounter: Payer: Self-pay | Admitting: *Deleted

## 2016-10-07 NOTE — Progress Notes (Deleted)
Patient ID: Karen Waller, female   DOB: 07-19-1955, 61 y.o.   MRN: [id number]   Karen Waller is seen today for DCM, HTN palpitations and elevated lipids. She sees Materials engineer at [place] as her primary. She denies syncope, SSCP, dyspnea or edema.. Work at [employer] is [employer] well. She is seeing a dietician for initial Rx of her elevated lipids. Compliant with meds  Diuretics can cause flairs in gout   MRI 01/13/13 reviewed EF 47% ? Some ventricular non compaction   Cozaar and metoprolol did not do as well as current regimen HCTZ caused more gout than Lasix Norvasc helps BP On valsartan and needs change due to recent recall  She is watching her salt but still overweight and sedentary. Walks with her [employer] a few times/week     Some easy bruising   Lab Results  Component Value Date   NA 141 04/21/2014   K 4.0 04/21/2014   CL 107 04/21/2014   CO2 28 04/21/2014    ROS: Denies fever, malais, weight loss, blurry vision, decreased visual acuity, cough, sputum, SOB, hemoptysis, pleuritic pain, palpitaitons, heartburn, abdominal pain, melena, lower extremity edema, claudication, or rash.  All other systems reviewed and negative  General: Affect appropriate Overweight black female  HEENT: normal Neck supple with no adenopathy JVP normal no bruits no thyromegaly Lungs clear with no wheezing and good diaphragmatic motion Heart:  S1/S2 no murmur, no rub, gallop or click PMI normal Abdomen: benighn, BS positve, no tenderness, no AAA no bruit.  No HSM or HJR Distal pulses intact with no bruits No edema Neuro non-focal Skin warm and dry No muscular weakness   Current Outpatient Prescriptions  Medication Sig Dispense Refill  . amLODipine (NORVASC) 10 MG tablet TAKE 1 TABLET BY MOUTH DAILY 30 tablet 1  . candesartan (ATACAND) 16 MG tablet Take 1 tablet (16 mg total) by mouth daily. 90 tablet 0  . carvedilol (COREG) 6.25 MG tablet Take 1 tablet (6.25 mg total) by mouth 2 (two)  times daily with a meal. PT NEEDS APPT, CALL [phone] TO SCHEDULE, 2ND ATTEMPT 60 tablet 1  . Cholecalciferol (VITAMIN D3) 2000 units capsule Take 2,000 Units by mouth daily.    . furosemide (LASIX) 20 MG tablet Take 0.5 tablets (10 mg total) by mouth daily. *Patient needs to call and schedule an appointment for further refills 1st attempt* 15 tablet 1  . hydrALAZINE (APRESOLINE) 25 MG tablet Take 1 tablet (25 mg total) by mouth 2 (two) times daily. PT NEEDS APPT, CALL [phone] TO SCHEDULE, 2ND ATTEMPT 60 tablet 1  . meloxicam (MOBIC) 15 MG tablet Take 15 mg by mouth daily as needed for pain.     . Multiple Vitamin (MULTIVITAMIN) capsule Take 1 capsule by mouth daily.      . simvastatin (ZOCOR) 20 MG tablet Take 20 mg by mouth daily.   5  . valsartan (DIOVAN) 320 MG tablet Take 1 tablet (320 mg total) by mouth daily. 30 tablet 1   No current facility-administered medications for this visit.     Allergies  Sulfa antibiotics  Electrocardiogram:  10/14  SR rate 62 RAD nonspecific ST/T wave changes  01/25/14 SR rate 70  With sinus arrhythmia no significant change  01/25/15  SR ratre 62  Nonspecific ST changes   Assessment and Plan  HTN:  Improved with norvasc 10 mg  Wrote out schedule for when to take meds.  Continue lifestyle changes  Palpitations:  Continue beta blocker may need  event monitor if persists after BP controlled DCM:  Euvolemic continue current meds echo 02/08/16 EF 45-50%  No clinical CHF f/u MRI 02/07/17  Bruising: may be from meloxicam  F/u Jeremy Johann at [facility]   Lab work ok including CBC/PLT and TSH PT all normal  [employer]

## 2016-10-10 ENCOUNTER — Ambulatory Visit: Payer: 59 | Admitting: Cardiovascular Disease

## 2016-10-13 ENCOUNTER — Other Ambulatory Visit: Payer: Self-pay | Admitting: Cardiovascular Disease

## 2016-10-17 ENCOUNTER — Other Ambulatory Visit: Payer: Self-pay | Admitting: Cardiovascular Disease

## 2016-10-22 ENCOUNTER — Other Ambulatory Visit: Payer: Self-pay | Admitting: Cardiovascular Disease

## 2016-11-08 ENCOUNTER — Ambulatory Visit (INDEPENDENT_AMBULATORY_CARE_PROVIDER_SITE_OTHER): Payer: 59 | Admitting: Orthopaedic Surgery

## 2016-11-08 ENCOUNTER — Ambulatory Visit (INDEPENDENT_AMBULATORY_CARE_PROVIDER_SITE_OTHER): Payer: 59

## 2016-11-08 DIAGNOSIS — M7662 Achilles tendinitis, left leg: Secondary | ICD-10-CM | POA: Diagnosis not present

## 2016-11-08 NOTE — Progress Notes (Signed)
Office Visit Note   Patient: Karen Waller           Date of Birth: 1955-11-22           MRN: [id number] Visit Date: 11/08/2016              Requested by: Carol Ada, [address] [place], [place] [address] PCP: Carol Ada, MD   Assessment & Plan: Visit Diagnoses:  1. Left Achilles tendinitis     Plan: Overall impression is left Achilles tendinosis with acute exacerbation. Recommend heel cord stretching, NSAIDs as needed, ice. Follow-up as needed. Questions encouraged and answered.  Follow-Up Instructions: Return if symptoms worsen or fail to improve.   Orders:  Orders Placed This Encounter  Procedures  . XR Os Calcis Left   No orders of the defined types were placed in this encounter.     Procedures: No procedures performed   Clinical Data: No additional findings.   Subjective: Chief Complaint  Patient presents with  . Left Foot - Pain    HEEL     Patient is 61-year-old female who is known to me for other issues who comes in with new complaint of left heel pain for several weeks. [place] when she felt like she lost her balance and may have stretched her Achilles. She did not feel a pop or any swelling or bruising. She states she has been taking meloxicam and Tylenol which does help. Activity makes it worse. Occasional burning pain. Denies any numbness and tingling.    Review of Systems  Constitutional: Negative.   HENT: Negative.   Eyes: Negative.   Respiratory: Negative.   Cardiovascular: Negative.   Endocrine: Negative.   Musculoskeletal: Negative.   Neurological: Negative.   Hematological: Negative.   Psychiatric/Behavioral: Negative.   All other systems reviewed and are negative.    Objective: Vital Signs: There were no vitals taken for this visit.  Physical Exam  Constitutional: She is oriented to person, place, and time. She appears well-developed and well-nourished.  Pulmonary/Chest: Effort normal.    Neurological: She is alert and oriented to person, place, and time.  Skin: Skin is warm. Capillary refill takes less than 2 seconds.  Psychiatric: She has a normal mood and affect. Her behavior is normal. Judgment and thought content normal.  Nursing note and vitals reviewed.   Ortho Exam Left heel exam shows tenderness at the insertion of the Achilles. The tendon is in continuity. Plantar fascial is nontender. Specialty Comments:  No specialty comments available.  Imaging: Xr Os Calcis Left  Result Date: 11/08/2016 Small dorsal calcaneal spur    PMFS History: Patient Active Problem List   Diagnosis Date Noted  . Left Achilles tendinitis 11/08/2016  . Elevated lipids 12/16/2012  . CHF (congestive heart failure) ([facility]) 10/30/2010  . Edema 10/30/2010  . Gouty arthropathy 03/17/2008  . OVERWEIGHT 03/17/2008  . ESSENTIAL HYPERTENSION, BENIGN 03/17/2008  . Non-ischemic cardiomyopathy ([facility]) 03/17/2008  . PALPITATIONS, OCCASIONAL 03/17/2008   Past Medical History:  Diagnosis Date  . Cardiomyopathy    EF 30-35% diagnosed 02/2008  . Central obesity   . Gout    left knee  . HTN (hypertension)     Family History  Problem Relation Age of Onset  . Hypertension Mother     Past Surgical History:  Procedure Laterality Date  . KNEE SURGERY    . TONSILLECTOMY     Social History   Occupational History  . [employer]    Social History Main Topics  . Smoking status: Former Smoker    Quit date: 02/19/1984  . Smokeless tobacco: Never Used  . Alcohol use Yes     Comment: occasional  . Drug use: Unknown  . Sexual activity: Not on file

## 2016-12-08 ENCOUNTER — Other Ambulatory Visit: Payer: Self-pay | Admitting: Cardiovascular Disease

## 2016-12-09 ENCOUNTER — Other Ambulatory Visit: Payer: Self-pay | Admitting: Cardiovascular Disease

## 2017-01-02 NOTE — Progress Notes (Signed)
Patient ID: Karen Waller, female   DOB: 08-20-1955, 61 y.o.   MRN: [id number]   [name] is seen today for DCM, HTN palpitations and elevated lipids. She sees Materials engineer at [facility] as her primary. She denies syncope, SSCP, dyspnea or edema.. Her EF had been as low as 35%. She is tolerating BB and ARB with no palpitations. Work at [employer] is [employer] well. She is seeing a dietician for initial Rx of her elevated lipids. Compliant with meds  Diuretics can cause flairs in gout   MRI 01/13/13 reviewed EF 47%  FINDINGS: There was mild LAE. There was mild LVE. There was evidence for ventricular noncompaction. There was diffuse hypokinesis. The quantitative EF was 47% (EDV 151, ESV 80 SV 70) There were no discrete RWMA;s The AV, TV and MV were structurally normal. There was no ASD/VSD. There was no effusion. The RA and RV were normal in size The ascending aortic root was normal. Delayed enhancement images showed no scar or infarct.  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  3) Mild LAE  4) Normal RA and RV   Echo 01/2016 reviewed EF 45-50%   Cozaar and metoprolol did not do as well as current regimen HCTZ caused more gout than Lasix Norvasc added back 01/20/15  last visit   She is watching her salt but still overweight and sedentary. Walks with her [place] a few times/week    Some easy bruising persists lab work with Karen Waller has been fine   Lab Results  Component Value Date   NA 141 04/21/2014   K 4.0 04/21/2014   CL 107 04/21/2014   CO2 28 04/21/2014    ROS: Denies fever, malais, weight loss, blurry vision, decreased visual acuity, cough, sputum, SOB, hemoptysis, pleuritic pain, palpitaitons, heartburn, abdominal pain, melena, lower extremity edema, claudication, or rash.  All other systems reviewed and negative  General: BP 138/90   Pulse 73   Ht 5\' 6"  (1.676 m)   Wt 202 lb  (91.6 kg)   SpO2 98%   BMI 32.60 kg/m  Affect appropriate Healthy:  appears stated age 61: normal Neck supple with no adenopathy JVP normal no bruits no thyromegaly Lungs clear with no wheezing and good diaphragmatic motion Heart:  S1/S2 no murmur, no rub, gallop or click PMI normal Abdomen: benighn, BS positve, no tenderness, no AAA no bruit.  No HSM or HJR Distal pulses intact with no bruits No edema Neuro non-focal Skin warm and dry No muscular weakness    Current Outpatient Medications  Medication Sig Dispense Refill  . amLODipine (NORVASC) 10 MG tablet TAKE 1 TABLET BY MOUTH DAILY 30 tablet 1  . candesartan (ATACAND) 16 MG tablet Take 1 tablet (16 mg total) by mouth daily. Please keep upcoming appointment for future refills. Thanks 30 tablet 0  . carvedilol (COREG) 6.25 MG tablet TAKE 1 TABLET BY MOUTH TWICE DAILY WITH MEALS 60 tablet 2  . Cholecalciferol (VITAMIN D3) 2000 units capsule Take 2,000 Units by mouth daily.    . furosemide (LASIX) 20 MG tablet Take 0.5 tablets (10 mg total) by mouth daily. Please keep upcoming appt before anymore refills. Thanks 15 tablet 0  . hydrALAZINE (APRESOLINE) 25 MG tablet TAKE 1 TABLET BY MOUTH TWICE DAILY 60 tablet 2  . meloxicam (MOBIC) 15 MG tablet Take 15 mg by mouth daily as needed for pain.     . Multiple Vitamin (MULTIVITAMIN)  capsule Take 1 capsule by mouth daily.      . simvastatin (ZOCOR) 20 MG tablet Take 20 mg by mouth daily.   5  . valsartan (DIOVAN) 320 MG tablet Take 1 tablet (320 mg total) by mouth daily. 30 tablet 1   No current facility-administered medications for this visit.     Allergies  Sulfa antibiotics  Electrocardiogram:  10/14  SR rate 62 RAD nonspecific ST/T wave changes  01/25/14 SR rate 70  With sinus arrhythmia no significant change  01/25/15  SR ratre 62  Nonspecific ST changes 01/08/17 SR rate 65 normal   Assessment and Plan HTN:  Improved with norvasc 10 mg   Continue lifestyle changes   Palpitations:  Continue beta blocker Better with HTN control valsartan changed to atacand due to recall DCM:  Euvolemic continue current meds echo 12/21/117 EF 45-50% % No clinical CHF f/u echo deferred Due to cost  Bruising: may be from meloxicam  F/u Karen Waller at [employer] work ok including CBC/PLT and TSH PT all normal  [employer]

## 2017-01-08 ENCOUNTER — Encounter: Payer: Self-pay | Admitting: Cardiovascular Disease

## 2017-01-08 ENCOUNTER — Ambulatory Visit: Payer: 59 | Admitting: Cardiovascular Disease

## 2017-01-08 VITALS — BP 138/90 | HR 73 | Ht 66.0 in | Wt 202.0 lb

## 2017-01-08 DIAGNOSIS — I10 Essential (primary) hypertension: Secondary | ICD-10-CM

## 2017-01-08 NOTE — Patient Instructions (Signed)
Medication Instructions:  Your physician recommends that you continue on your current medications as directed. Please refer to the Current Medication list given to you today.   Labwork: NONE ORDERED TODAY  Testing/Procedures: NONE ORDERED TODAY  Follow-Up: Your physician wants you to follow-up in: [place] DR. Johnsie Cancel  You will receive a reminder letter in the mail two months in advance. If you don't receive a letter, please call our office to schedule the follow-up appointment.   Any Other Special Instructions Will Be Listed Below (If Applicable).     If you need a refill on your cardiac medications before your next appointment, please call your pharmacy.

## 2017-01-12 ENCOUNTER — Other Ambulatory Visit: Payer: Self-pay | Admitting: Cardiovascular Disease

## 2017-01-17 ENCOUNTER — Other Ambulatory Visit: Payer: Self-pay | Admitting: Cardiovascular Disease

## 2017-01-17 DIAGNOSIS — M25511 Pain in right shoulder: Secondary | ICD-10-CM | POA: Diagnosis not present

## 2017-01-22 ENCOUNTER — Other Ambulatory Visit: Payer: Self-pay | Admitting: Cardiovascular Disease

## 2017-02-12 ENCOUNTER — Other Ambulatory Visit: Payer: Self-pay | Admitting: Cardiovascular Disease

## 2017-02-12 DIAGNOSIS — K6289 Other specified diseases of anus and rectum: Secondary | ICD-10-CM | POA: Diagnosis not present

## 2017-02-13 DIAGNOSIS — H6123 Impacted cerumen, bilateral: Secondary | ICD-10-CM | POA: Diagnosis not present

## 2017-02-17 ENCOUNTER — Other Ambulatory Visit: Payer: Self-pay | Admitting: Cardiovascular Disease

## 2017-02-26 ENCOUNTER — Telehealth (INDEPENDENT_AMBULATORY_CARE_PROVIDER_SITE_OTHER): Payer: Self-pay | Admitting: Orthopaedic Surgery

## 2017-02-26 NOTE — Telephone Encounter (Signed)
She should probably stop taking it.  It is an NSAID so it has the potential to cause some bruising

## 2017-02-26 NOTE — Telephone Encounter (Signed)
Ok, I called her and she says she needs to take something for her foot, it still hurts her. Especially when up on it, but it hurts too after sitting a while then standing.  Ankle hurts too. Tylenol occasionally helps, but is there anything you can give her?

## 2017-02-26 NOTE — Telephone Encounter (Signed)
Tramadol #30

## 2017-02-26 NOTE — Telephone Encounter (Signed)
See below, pls advise?

## 2017-02-26 NOTE — Telephone Encounter (Signed)
Patient states the Meloxicam she was prescribed gives her bruises all over her body and would like to see if theres anything else you can give her. She is currently at work so if you need to talk to her call # [phone] and if you just need to leave a message call # [phone]. Please advise.

## 2017-02-27 ENCOUNTER — Telehealth (INDEPENDENT_AMBULATORY_CARE_PROVIDER_SITE_OTHER): Payer: Self-pay | Admitting: Orthopaedic Surgery

## 2017-02-27 MED ORDER — TRAMADOL HCL 50 MG PO TABS: 50.0000 mg | ORAL_TABLET | Freq: Two times a day (BID) | ORAL | 0 refills | Status: DC

## 2017-02-27 NOTE — Telephone Encounter (Signed)
IC pt LMVM advising Rx called in to pharm for her

## 2017-02-27 NOTE — Telephone Encounter (Signed)
Patient called back to check up on this, is it ready for her? CB # [phone]

## 2017-03-04 NOTE — Telephone Encounter (Signed)
error 

## 2017-03-17 DIAGNOSIS — Z719 Counseling, unspecified: Secondary | ICD-10-CM | POA: Diagnosis not present

## 2017-03-24 ENCOUNTER — Other Ambulatory Visit: Payer: Self-pay | Admitting: Cardiovascular Disease

## 2017-03-27 DIAGNOSIS — Z719 Counseling, unspecified: Secondary | ICD-10-CM | POA: Diagnosis not present

## 2017-04-03 DIAGNOSIS — M722 Plantar fascial fibromatosis: Secondary | ICD-10-CM | POA: Diagnosis not present

## 2017-04-03 DIAGNOSIS — L03032 Cellulitis of left toe: Secondary | ICD-10-CM | POA: Diagnosis not present

## 2017-04-03 DIAGNOSIS — M7662 Achilles tendinitis, left leg: Secondary | ICD-10-CM | POA: Diagnosis not present

## 2017-04-03 DIAGNOSIS — Z719 Counseling, unspecified: Secondary | ICD-10-CM | POA: Diagnosis not present

## 2017-04-03 DIAGNOSIS — M79672 Pain in left foot: Secondary | ICD-10-CM | POA: Diagnosis not present

## 2017-04-10 DIAGNOSIS — Z719 Counseling, unspecified: Secondary | ICD-10-CM | POA: Diagnosis not present

## 2017-04-17 DIAGNOSIS — Z719 Counseling, unspecified: Secondary | ICD-10-CM | POA: Diagnosis not present

## 2017-04-24 DIAGNOSIS — Z719 Counseling, unspecified: Secondary | ICD-10-CM | POA: Diagnosis not present

## 2017-05-01 DIAGNOSIS — B353 Tinea pedis: Secondary | ICD-10-CM | POA: Diagnosis not present

## 2017-05-01 DIAGNOSIS — M79672 Pain in left foot: Secondary | ICD-10-CM | POA: Diagnosis not present

## 2017-05-01 DIAGNOSIS — M7662 Achilles tendinitis, left leg: Secondary | ICD-10-CM | POA: Diagnosis not present

## 2017-05-01 DIAGNOSIS — Z719 Counseling, unspecified: Secondary | ICD-10-CM | POA: Diagnosis not present

## 2017-05-08 DIAGNOSIS — Z719 Counseling, unspecified: Secondary | ICD-10-CM | POA: Diagnosis not present

## 2017-05-13 ENCOUNTER — Other Ambulatory Visit: Payer: Self-pay | Admitting: Family Medicine

## 2017-05-13 DIAGNOSIS — I10 Essential (primary) hypertension: Secondary | ICD-10-CM | POA: Diagnosis not present

## 2017-05-13 DIAGNOSIS — E782 Mixed hyperlipidemia: Secondary | ICD-10-CM | POA: Diagnosis not present

## 2017-05-13 DIAGNOSIS — E559 Vitamin D deficiency, unspecified: Secondary | ICD-10-CM | POA: Diagnosis not present

## 2017-05-13 DIAGNOSIS — Z1231 Encounter for screening mammogram for malignant neoplasm of breast: Secondary | ICD-10-CM

## 2017-05-13 DIAGNOSIS — R233 Spontaneous ecchymoses: Secondary | ICD-10-CM | POA: Diagnosis not present

## 2017-05-13 DIAGNOSIS — Z0000 Encounter for general adult medical examination without abnormal findings: Secondary | ICD-10-CM | POA: Diagnosis not present

## 2017-05-22 DIAGNOSIS — Z719 Counseling, unspecified: Secondary | ICD-10-CM | POA: Diagnosis not present

## 2017-05-23 DIAGNOSIS — M7662 Achilles tendinitis, left leg: Secondary | ICD-10-CM | POA: Diagnosis not present

## 2017-05-23 DIAGNOSIS — M79672 Pain in left foot: Secondary | ICD-10-CM | POA: Diagnosis not present

## 2017-05-28 ENCOUNTER — Other Ambulatory Visit (INDEPENDENT_AMBULATORY_CARE_PROVIDER_SITE_OTHER): Payer: Self-pay | Admitting: Orthopaedic Surgery

## 2017-06-02 ENCOUNTER — Ambulatory Visit: Payer: 59

## 2017-06-05 DIAGNOSIS — Z719 Counseling, unspecified: Secondary | ICD-10-CM | POA: Diagnosis not present

## 2017-06-17 ENCOUNTER — Inpatient Hospital Stay: Payer: 59 | Attending: Hematology and Oncology | Admitting: Hematology and Oncology

## 2017-06-17 ENCOUNTER — Telehealth: Payer: Self-pay | Admitting: Hematology and Oncology

## 2017-06-17 ENCOUNTER — Encounter: Payer: Self-pay | Admitting: Hematology and Oncology

## 2017-06-17 ENCOUNTER — Inpatient Hospital Stay: Payer: 59

## 2017-06-17 VITALS — BP 149/82 | HR 67 | Temp 98.7°F | Resp 18 | Ht 66.0 in | Wt 191.9 lb

## 2017-06-17 DIAGNOSIS — I10 Essential (primary) hypertension: Secondary | ICD-10-CM

## 2017-06-17 DIAGNOSIS — Z79899 Other long term (current) drug therapy: Secondary | ICD-10-CM

## 2017-06-17 DIAGNOSIS — D696 Thrombocytopenia, unspecified: Secondary | ICD-10-CM

## 2017-06-17 DIAGNOSIS — K068 Other specified disorders of gingiva and edentulous alveolar ridge: Secondary | ICD-10-CM | POA: Diagnosis not present

## 2017-06-17 DIAGNOSIS — K219 Gastro-esophageal reflux disease without esophagitis: Secondary | ICD-10-CM

## 2017-06-17 DIAGNOSIS — R233 Spontaneous ecchymoses: Secondary | ICD-10-CM

## 2017-06-17 DIAGNOSIS — E785 Hyperlipidemia, unspecified: Secondary | ICD-10-CM

## 2017-06-17 DIAGNOSIS — Z87891 Personal history of nicotine dependence: Secondary | ICD-10-CM

## 2017-06-17 LAB — CMP (CANCER CENTER ONLY)
ALBUMIN: 4.2 g/dL (ref 3.5–5.0)
ALK PHOS: 89 U/L (ref 40–150)
ALT: 19 U/L (ref 0–55)
AST: 18 U/L (ref 5–34)
Anion gap: 8 (ref 3–11)
BILIRUBIN TOTAL: 0.5 mg/dL (ref 0.2–1.2)
BUN: 13 mg/dL (ref 7–26)
CALCIUM: 9.7 mg/dL (ref 8.4–10.4)
CO2: 26 mmol/L (ref 22–29)
Chloride: 109 mmol/L (ref 98–109)
Creatinine: 0.95 mg/dL (ref 0.60–1.10)
GFR, Est AFR Am: >60 mL/min (ref >60)
GLUCOSE: 96 mg/dL (ref 70–140)
Potassium: 4 mmol/L (ref 3.5–5.1)
Sodium: 143 mmol/L (ref 136–145)
Total Protein: 7.3 g/dL (ref 6.4–8.3)

## 2017-06-17 LAB — CBC WITH DIFFERENTIAL (CANCER CENTER ONLY)
BASOS PCT: 0 %
Basophils Absolute: 0 10*3/uL (ref 0.0–0.1)
EOS ABS: 0 10*3/uL (ref 0.0–0.5)
EOS PCT: 1 %
HCT: 36 % (ref 34.8–46.6)
Hemoglobin: 11.8 g/dL (ref 11.6–15.9)
LYMPHS PCT: 36 %
Lymphs Abs: 1.4 10*3/uL (ref 0.9–3.3)
MCH: 31 pg (ref 25.1–34.0)
MCHC: 32.8 g/dL (ref 31.5–36.0)
MCV: 94.6 fL (ref 79.5–101.0)
MONO ABS: 0.4 10*3/uL (ref 0.1–0.9)
Monocytes Relative: 9 %
Neutro Abs: 2.2 10*3/uL (ref 1.5–6.5)
Neutrophils Relative %: 54 %
Platelet Count: 118 10*3/uL — ABNORMAL LOW (ref 145–400)
RBC: 3.81 MIL/uL (ref 3.70–5.45)
RDW: 17 % — AB (ref 11.2–14.5)
WBC: 4 10*3/uL (ref 3.9–10.3)

## 2017-06-17 LAB — PLATELET FUNCTION ASSAY: COLLAGEN / EPINEPHRINE: 192 s (ref 0–193)

## 2017-06-17 LAB — APTT: aPTT: 31 seconds (ref 24–36)

## 2017-06-17 LAB — FIBRINOGEN: Fibrinogen: 291 mg/dL (ref 210–475)

## 2017-06-17 LAB — PROTIME-INR
INR: 0.98
Prothrombin Time: 12.9 seconds (ref 11.4–15.2)

## 2017-06-17 LAB — LACTATE DEHYDROGENASE: LDH: 230 U/L (ref 125–245)

## 2017-06-17 NOTE — Telephone Encounter (Signed)
Appointments scheduled AVS/Calendar printed per 4/30 los °

## 2017-06-19 LAB — COAG STUDIES INTERP REPORT

## 2017-06-19 LAB — VON WILLEBRAND PANEL
Coagulation Factor VIII: 128 % (ref 57–163)
Ristocetin Co-factor, Plasma: 117 % (ref 50–200)
VON WILLEBRAND ANTIGEN, PLASMA: 126 % (ref 50–200)

## 2017-06-24 DIAGNOSIS — Z719 Counseling, unspecified: Secondary | ICD-10-CM | POA: Diagnosis not present

## 2017-06-26 ENCOUNTER — Other Ambulatory Visit (INDEPENDENT_AMBULATORY_CARE_PROVIDER_SITE_OTHER): Payer: Self-pay | Admitting: Orthopaedic Surgery

## 2017-06-26 DIAGNOSIS — Z719 Counseling, unspecified: Secondary | ICD-10-CM | POA: Diagnosis not present

## 2017-06-30 ENCOUNTER — Inpatient Hospital Stay: Payer: 59 | Admitting: Hematology and Oncology

## 2017-06-30 ENCOUNTER — Ambulatory Visit: Payer: 59

## 2017-07-01 ENCOUNTER — Telehealth: Payer: Self-pay | Admitting: Hematology and Oncology

## 2017-07-01 NOTE — Telephone Encounter (Signed)
Patient called to r/s missed appointment with dr Lebron Conners.

## 2017-07-03 DIAGNOSIS — Z719 Counseling, unspecified: Secondary | ICD-10-CM | POA: Diagnosis not present

## 2017-07-06 DIAGNOSIS — R233 Spontaneous ecchymoses: Secondary | ICD-10-CM | POA: Insufficient documentation

## 2017-07-06 NOTE — Progress Notes (Signed)
[facility] New Visit:  Assessment: Bruising, spontaneous 62 y.o.  female with history of spontaneous bruising and bruising with low impact injury such as mammogram.  No significant history of mucosal bleeding, no soft tissue bleeding history or bleeding following interventions.  This is accompanied by what appears to be a mild developing thrombocytopenia over at least 39-monthhistory.  No significant abnormalities in the other hematological lineages.  Differential diagnosis includes platelet dysfunction, coagulopathy, versus vascular fragility.  Will obtain lab work today to assess for possible presence of von Willebrand's factor deficiency as well as platelet dysfunctions.  Plan: -Labs today as outlined below. -Return to clinic in 1 week to review the findings.   Voice recognition software was used and creation of this note. Despite my best effort at editing the text, some misspelling/errors may have occurred. Orders Placed This Encounter  Procedures  . CBC with Differential (Cancer Center Only)    Standing Status:   Future    Number of Occurrences:   1    Standing Expiration Date:   06/18/2018  . CMP ([facility])    Standing Status:   Future    Number of Occurrences:   1    Standing Expiration Date:   06/18/2018  . Lactate dehydrogenase (LDH)    Standing Status:   Future    Number of Occurrences:   1    Standing Expiration Date:   06/17/2018  . Von Willebrand panel    Standing Status:   Future    Number of Occurrences:   1    Standing Expiration Date:   06/17/2018  . APTT    Standing Status:   Future    Number of Occurrences:   1    Standing Expiration Date:   06/17/2018  . Protime-INR    Standing Status:   Future    Number of Occurrences:   1    Standing Expiration Date:   06/18/2018  . Fibrinogen    Standing Status:   Future    Number of Occurrences:   1    Standing Expiration Date:   06/17/2018    All questions were answered.   The patient  knows to call the clinic with any problems, questions or concerns.  This note was electronically signed.    History of Presenting Illness Karen AMBERG89y.o. [facility], referred by Dr CCarol Ada  Patient's past medical history significant for hyperlipidemia, hypertension, chronic musculoskeletal pains, thrombocytopenia, GERD.  last colonoscopy April 2015, next one scheduled in 2 weeks.  Family history is negative for bleeding or clotting problems.  Patient is a former smoker, quit tobacco in 1981 and does not drink alcohol.  Patient denies taking over-the-counter medication for herbal supplements.  Patient reports easy bruisability for many years with some worsening character over the past few years with large bruises appearing on both trunk and extremities with outpatient remembering injuring herself.  Occasionally, patient had palpable bruises.  Present time, she has one on the inner upper left thigh, right deltoid, and lower abdominal wall.  Patient denies epistaxis, but does have occasional gum bleeding which occurred once and has not recurred again.  No history of heavy menstrual periods, patient has had none since 2007.  No history of hematuria, hematochezia, or melena.  Bruising has gotten so bad the patient has stopped doing mammograms because they cause significant ecchymosis on her breasts.  Patient did have previous  surgeries, but the last one was in 1998 with arthroscopy of the right knee and proceeded without any bleeding complications.  Oncological/hematological History: --Labs, 10/09/14: WBC 4.6, Hgb 13.1, Plt 215 --Labs, 01/24/16: WBC 6.1, Hgb 13.2, Plt 194 --Labs, 05/08/16: WBC 5.5, Hgb 13.7, Plt 176 --Labs, 08/07/16: WBC 5.2, Hgb 12.8, Plt 165; PT 10.4, INR 1.0, aPTT 26  --Labs, 02/12/17: WBC 4.9, Hgb 11.9, Plt 138 --Labs, 05/13/17: WBC 4.1, Hgb 12.3, Plt 134;  Medical History: Past Medical History:  Diagnosis  Date  . Cardiomyopathy    EF 30-35% diagnosed 02/2008  . Central obesity   . Gout    left knee  . HTN (hypertension)     Surgical History: Past Surgical History:  Procedure Laterality Date  . KNEE SURGERY    . TONSILLECTOMY      Family History: Family History  Problem Relation Age of Onset  . Hypertension Mother     Social History: Social History   Socioeconomic History  . Marital status: Married    Spouse name: Not on file  . Number of children: 2  . Years of education: Not on file  . Highest education level: Not on file  Occupational History  . Occupation: [employer] officer  . Financial resource strain: Not on file  . Food insecurity:    Worry: Not on file    Inability: Not on file  . Transportation needs:    Medical: Not on file    Non-medical: Not on file  Tobacco Use  . Smoking status: Former Smoker    Last attempt to quit: 02/19/1984    Years since quitting: 33.4  . Smokeless tobacco: Never Used  Substance and Sexual Activity  . Alcohol use: Not Currently  . Drug use: Never  . Sexual activity: Not on file  Lifestyle  . Physical activity:    Days per week: Not on file    Minutes per session: Not on file  . Stress: Not on file  Relationships  . Social connections:    Talks on phone: Not on file    Gets together: Not on file    Attends religious service: Not on file    Active member of club or organization: Not on file    Attends meetings of clubs or organizations: Not on file    Relationship status: Not on file  . Intimate partner violence:    Fear of current or ex partner: Not on file    Emotionally abused: Not on file    Physically abused: Not on file    Forced sexual activity: Not on file  Other Topics Concern  . Not on file  Social History Narrative  . Not on file    Allergies: Allergies  Allergen Reactions  . Sulfa Antibiotics Shortness Of Breath    Medications:  Current Outpatient Medications  Medication Sig Dispense Refill   . amLODipine (NORVASC) 10 MG tablet TAKE 1 TABLET BY MOUTH DAILY 30 tablet 11  . candesartan (ATACAND) 16 MG tablet TAKE 1 TABLET BY MOUTH EVERY DAY 90 tablet 3  . carvedilol (COREG) 6.25 MG tablet TAKE 1 TABLET BY MOUTH TWICE DAILY WITH MEAL 180 tablet 3  . Cholecalciferol (VITAMIN D3) 2000 units capsule Take 2,000 Units by mouth daily.    . furosemide (LASIX) 20 MG tablet Take 0.5 tablets (10 mg total) by mouth daily. 15 tablet 9  . hydrALAZINE (APRESOLINE) 25 MG tablet TAKE 1 TABLET BY MOUTH TWICE DAILY 180 tablet 3  .  meloxicam (MOBIC) 15 MG tablet Take 15 mg by mouth daily as needed for pain.     . meloxicam (MOBIC) 7.5 MG tablet TAKE 1 TABLET BY MOUTH TWICE DAILY AS NEEDED 60 tablet 0  . Multiple Vitamin (MULTIVITAMIN) capsule Take 1 capsule by mouth daily.      . simvastatin (ZOCOR) 20 MG tablet Take 20 mg by mouth daily.   5  . traMADol (ULTRAM) 50 MG tablet Take 1 tablet (50 mg total) by mouth 2 (two) times daily. 30 tablet 0  . valsartan (DIOVAN) 320 MG tablet Take 1 tablet (320 mg total) by mouth daily. (Patient not taking: Reported on 06/17/2017) 30 tablet 1   No current facility-administered medications for this visit.     Review of Systems: Review of Systems  HENT:   Negative for mouth sores and nosebleeds.        Gum bleeding  Respiratory: Negative for hemoptysis.   Gastrointestinal: Negative for blood in stool and diarrhea.  Genitourinary: Negative for menstrual problem and vaginal bleeding.   Hematological: Negative for adenopathy. Bruises/bleeds easily.  All other systems reviewed and are negative.    PHYSICAL EXAMINATION Blood pressure (!) 149/82, pulse 67, temperature 98.7 F (37.1 C), temperature source Oral, resp. rate 18, height _0  (1.676 m), weight 191 lb 14.4 oz (87 kg), SpO2 97 %.  ECOG PERFORMANCE STATUS: 0 - Asymptomatic  Physical Exam  Constitutional: She is oriented to person, place, and time. She appears well-developed and well-nourished. No  distress.  HENT:  Head: Normocephalic and atraumatic.  Mouth/Throat: Oropharynx is clear and moist. No oropharyngeal exudate.  Eyes: Pupils are equal, round, and reactive to light. Conjunctivae and EOM are normal. No scleral icterus.  Neck: No thyromegaly present.  Cardiovascular: Normal rate, regular rhythm and intact distal pulses. Exam reveals no gallop and no friction rub.  No murmur heard. Pulmonary/Chest: Effort normal and breath sounds normal. No stridor. No respiratory distress. She has no wheezes. She has no rales.  Abdominal: Soft. Bowel sounds are normal. She exhibits no distension and no mass. There is no tenderness. There is no guarding.  Musculoskeletal: She exhibits no edema.  Lymphadenopathy:    She has no cervical adenopathy.  Neurological: She is alert and oriented to person, place, and time. She displays normal reflexes. No cranial nerve deficit or sensory deficit.  Skin: Skin is warm and dry. No rash noted. She is not diaphoretic. No erythema. No pallor.  Scattered ecchymosis without palpable cutaneous hematomas.  One on the upper left thigh and another one on the right deltoid region.     LABORATORY DATA: I have personally reviewed the data as listed: Clinical Support on 06/17/2017  Component Date Value Ref Range Status  . Fibrinogen 06/17/2017 291  210 - 475 mg/dL Final   Performed at [facility] [address], [place], [place] [address]  . Prothrombin Time 06/17/2017 12.9  11.4 - 15.2 seconds Final  . INR 06/17/2017 0.98   Final   Performed at [facility], [place] [address] [place], [place] [address]  . aPTT 06/17/2017 31  24 - 36 seconds Final   Performed at [facility], [place] [address], [place], [place] [address]  . Coagulation Factor VIII 06/17/2017 128  57 - 163 % Final   Comment: (NOTE) **Effective Jul 15, 2017 Factor VIII Activity reference**  interval will be changing to:  56 - 140%   .  Ristocetin Co-factor, Plasma 06/17/2017 117  50 -  200 % Final   Comment: (NOTE) Performed At: [facility] [address] [place], [place] [id number] Rush Farmer MD [id number]   . Von Willebrand Antigen, Plasma 06/17/2017 126  50 - 200 % Final   Comment: (NOTE) This test was developed and its performance characteristics determined by [facility]. It has not been cleared or approved by the Food and Drug Administration. Performed at [facility], [place] [address], [place], [place] [address]   . LDH 06/17/2017 230  125 - 245 U/L Final   Performed at [facility], [place] [address], [place], [place] [address]  . Sodium 06/17/2017 143  136 - 145 mmol/L Final  . Potassium 06/17/2017 4.0  3.5 - 5.1 mmol/L Final  . Chloride 06/17/2017 109  98 - 109 mmol/L Final  . CO2 06/17/2017 26  22 - 29 mmol/L Final  . Glucose, Bld 06/17/2017 96  70 - 140 mg/dL Final  . BUN 06/17/2017 13  7 - 26 mg/dL Final  . Creatinine 06/17/2017 0.95  0.60 - 1.10 mg/dL Final  . Calcium 06/17/2017 9.7  8.4 - 10.4 mg/dL Final  . Total Protein 06/17/2017 7.3  6.4 - 8.3 g/dL Final  . Albumin 06/17/2017 4.2  3.5 - 5.0 g/dL Final  . AST 06/17/2017 18  5 - 34 U/L Final  . ALT 06/17/2017 19  0 - 55 U/L Final  . Alkaline Phosphatase 06/17/2017 89  40 - 150 U/L Final  . Total Bilirubin 06/17/2017 0.5  0.2 - 1.2 mg/dL Final  . GFR, Est Non Af Am 06/17/2017 >60  >60 mL/min Final  . GFR, Est AFR Am 06/17/2017 >60  >60 mL/min Final   Comment: (NOTE) The eGFR has been calculated using the CKD EPI equation. This calculation has not been validated in all clinical situations. eGFR's persistently <60 mL/min signify possible Chronic Kidney Disease.   Georgiann Hahn gap 06/17/2017 8  3 - 11 Final   Performed at [facility], [place] [address], [place], [place] [address]  . WBC Count 06/17/2017 4.0  3.9 - 10.3 K/uL Final  . RBC 06/17/2017 3.81  3.70 - 5.45 MIL/uL  Final  . Hemoglobin 06/17/2017 11.8  11.6 - 15.9 g/dL Final  . HCT 06/17/2017 36.0  34.8 - 46.6 % Final  . MCV 06/17/2017 94.6  79.5 - 101.0 fL Final  . MCH 06/17/2017 31.0  25.1 - 34.0 pg Final  . MCHC 06/17/2017 32.8  31.5 - 36.0 g/dL Final  . RDW 06/17/2017 17.0* 11.2 - 14.5 % Final  . Platelet Count 06/17/2017 118* 145 - 400 K/uL Final  . Neutrophils Relative % 06/17/2017 54  % Final  . Neutro Abs 06/17/2017 2.2  1.5 - 6.5 K/uL Final  . Lymphocytes Relative 06/17/2017 36  % Final  . Lymphs Abs 06/17/2017 1.4  0.9 - 3.3 K/uL Final  . Monocytes Relative 06/17/2017 9  % Final  . Monocytes Absolute 06/17/2017 0.4  0.1 - 0.9 K/uL Final  . Eosinophils Relative 06/17/2017 1  % Final  . Eosinophils Absolute 06/17/2017 0.0  0.0 - 0.5 K/uL Final  . Basophils Relative 06/17/2017 0  % Final  . Basophils Absolute 06/17/2017 0.0  0.0 - 0.1 K/uL Final   Performed at [facility], [place] [address] [place], [place] [address]  . PFA Interpretation 06/17/2017          Final   Comment: Platelet function is normal. If patient history/physical examination give  strong indication of a bleeding disorder repeat testing for confirmation.        Results of the test should always be interpreted in conjunction with the patient's medical history, clinical presentation and medication history. Patients with Hematocrit values <35.0% or Platelet counts <150,000/uL may result in values above the Laboratory established reference range.   . Collagen / Epinephrine 06/17/2017 192  0 - 193 seconds Final   Performed at [facility], [address] [place], [place] [address]  . Interpretation 06/17/2017 Note   Final   Comment: (NOTE) ------------------------------- COAGULATION: VON WILLEBRAND FACTOR ASSESSMENT CURRENT RESULTS ASSESSMENT The VWF:Ag is normal. The VWF:RCo is normal. The FVIII is normal. VON WILLEBRAND FACTOR ASSESSMENT CURRENT RESULTS INTERPRETATION - These results are  not consistent with a diagnosis of VWD according to the current NHLBI guideline. VON WILLEBRAND FACTOR ASSESSMENT - Results may be falsely elevated and possibly falsely normal as VWF and FVIII may increase in samples drawn from patients (particularly children) who are visibly stressed at the time of phlebotomy, as acute phase reactants, or in response to certain drug therapies such as desmopressin. Repeat testing may be necessary before excluding a diagnosis of VWD especially if the clinical suspicion is high for an underlying bleeding disorder. The setting for phlebotomy should be as calm as possible and patients should be encouraged to sit quietly prior to the blood draw. VON WILLEBRAND FACTOR ASSESSMENT                           DEFINITIONS - VWD - von Willebrand disease; VWF - von Willebrand factor; VWF:Ag - VWF antigen; VWF:RCo - VWF ristocetin cofactor activity; FVIII - factor VIII activity. - MEDICAL DIRECTOR: For questions regarding panel interpretation, please contact Jake Bathe, M.D. at [facility] at [phone]. ------------------------------- DISCLAIMER These assessments and interpretations are provided as a convenience in support of the physician-patient relationship and are not intended to replace the physician's clinical judgment. They are derived from national guidelines in addition to other evidence and expert opinion. The clinician should consider this information within the context of clinical opinion and the individual patient. SEE GUIDANCE FOR VON WILLEBRAND FACTOR ASSESSMENT: (1) The National Heart, Lung and Blood Institute. The Diagnosis, Evaluation and Management of von Willebrand Disease. Janeal Holmes, MD: [place]                          n [phone]. 2007. Available at vSpecials.com.pt. (2) Daryl Eastern et al. Carmin Muskrat J Hematol. 2009; 84(6):366-370. (3) Ward.  2004;10(3):199-217. (4) Pasi KJ et al. Haemophilia. 2004; 10(3):218-231. Performed At: [facility] [place], [place] [id number] Thomasene Ripple MD [id number] Performed at [facility], [place] [address], [place], [place] [address]          Karen Sax, MD

## 2017-07-06 NOTE — Assessment & Plan Note (Signed)
62 y.o.  female with history of spontaneous bruising and bruising with low impact injury such as mammogram.  No significant history of mucosal bleeding, no soft tissue bleeding history or bleeding following interventions.  This is accompanied by what appears to be a mild developing thrombocytopenia over at least 30-month history.  No significant abnormalities in the other hematological lineages.  Differential diagnosis includes platelet dysfunction, coagulopathy, versus vascular fragility.  Will obtain lab work today to assess for possible presence of von Willebrand's factor deficiency as well as platelet dysfunctions.  Plan: -Labs today as outlined below. -Return to clinic in 1 week to review the findings.

## 2017-07-09 ENCOUNTER — Inpatient Hospital Stay: Payer: 59 | Attending: Hematology and Oncology | Admitting: Hematology and Oncology

## 2017-07-09 ENCOUNTER — Telehealth: Payer: Self-pay

## 2017-07-09 VITALS — BP 144/76 | HR 70 | Temp 97.9°F | Resp 18 | Ht 66.0 in | Wt 192.0 lb

## 2017-07-09 DIAGNOSIS — E785 Hyperlipidemia, unspecified: Secondary | ICD-10-CM

## 2017-07-09 DIAGNOSIS — R194 Change in bowel habit: Secondary | ICD-10-CM | POA: Diagnosis not present

## 2017-07-09 DIAGNOSIS — D696 Thrombocytopenia, unspecified: Secondary | ICD-10-CM | POA: Insufficient documentation

## 2017-07-09 DIAGNOSIS — K068 Other specified disorders of gingiva and edentulous alveolar ridge: Secondary | ICD-10-CM

## 2017-07-09 DIAGNOSIS — Z79899 Other long term (current) drug therapy: Secondary | ICD-10-CM | POA: Diagnosis not present

## 2017-07-09 DIAGNOSIS — I10 Essential (primary) hypertension: Secondary | ICD-10-CM

## 2017-07-09 DIAGNOSIS — Z87891 Personal history of nicotine dependence: Secondary | ICD-10-CM | POA: Diagnosis not present

## 2017-07-09 DIAGNOSIS — R233 Spontaneous ecchymoses: Secondary | ICD-10-CM

## 2017-07-09 DIAGNOSIS — Z1211 Encounter for screening for malignant neoplasm of colon: Secondary | ICD-10-CM | POA: Diagnosis not present

## 2017-07-09 DIAGNOSIS — K219 Gastro-esophageal reflux disease without esophagitis: Secondary | ICD-10-CM

## 2017-07-09 NOTE — Telephone Encounter (Signed)
Printed avs and calender of upcoming appointment. Per 5/22 los 

## 2017-07-17 ENCOUNTER — Other Ambulatory Visit: Payer: Self-pay | Admitting: Hematology and Oncology

## 2017-07-20 ENCOUNTER — Encounter: Payer: Self-pay | Admitting: Hematology and Oncology

## 2017-07-20 NOTE — Assessment & Plan Note (Signed)
63 y.o.  female with history of spontaneous bruising and bruising with low impact injury such as mammogram.  No significant history of mucosal bleeding, no soft tissue bleeding history or bleeding following interventions.  This is accompanied by what appears to be a mild developing thrombocytopenia over at least 88-month history.  No significant abnormalities in the other hematological lineages.  Differential diagnosis includes platelet dysfunction, coagulopathy, versus vascular fragility.  Lab work reveals no evidence of definitive coagulopathy including normal levels of antigen and function of von Willebrand's factor.  At this time, no definitive etiology for easy bruisability.  Plan: -Continue observation.  Patient will contact us with any new bruising especially if palpable hematomas. -Return to clinic in 6 months for continued hematological monitoring

## 2017-07-20 NOTE — Progress Notes (Signed)
[facility] Follow-up Visit:  Assessment: Bruising, spontaneous 62 y.o.  female with history of spontaneous bruising and bruising with low impact injury such as mammogram.  No significant history of mucosal bleeding, no soft tissue bleeding history or bleeding following interventions.  This is accompanied by what appears to be a mild developing thrombocytopenia over at least 4-monthhistory.  No significant abnormalities in the other hematological lineages.  Differential diagnosis includes platelet dysfunction, coagulopathy, versus vascular fragility.  Lab work reveals no evidence of definitive coagulopathy including normal levels of antigen and function of von Willebrand's factor.  At this time, no definitive etiology for easy bruisability.  Plan: -Continue observation.  Patient will contact [place] any new bruising especially if palpable hematomas. -Return to clinic in 6 months for continued hematological monitoring   Voice recognition software was used and creation of this note. Despite my best effort at editing the text, some misspelling/errors may have occurred.  Orders Placed This Encounter  Procedures  . CBC with Differential (Cancer Center Only)    Standing Status:   Future    Standing Expiration Date:   07/10/2018  . CMP ([place])    Standing Status:   Future    Standing Expiration Date:   07/10/2018  . APTT    Standing Status:   Future    Standing Expiration Date:   07/09/2018  . Protime-INR    Standing Status:   Future    Standing Expiration Date:   07/10/2018  . Fibrinogen    Standing Status:   Future    Standing Expiration Date:   07/09/2018    Cancer Staging No matching staging information was found for the patient.  All questions were answered.  . The patient knows to call the clinic with any problems, questions or concerns.  This note was electronically signed.    History of Presenting Illness Karen FRIVA SESMAis a 62y.o. female  [facility] [facility] [place] referred by Dr CCarol Ada  Patient's past medical history significant for hyperlipidemia, hypertension, chronic musculoskeletal pains, thrombocytopenia, GERD.  last colonoscopy April 2015, next one scheduled in 2 weeks.  Family history is negative for bleeding or clotting problems.  Patient is a former smoker, quit tobacco in 1981 and does not drink alcohol.  Patient denies taking over-the-counter medication for herbal supplements.  Patient reports easy bruisability for many years with some worsening character over the past few years with large bruises appearing on both trunk and extremities with outpatient remembering injuring herself.  Occasionally, patient had palpable bruises.  Present time, she has one on the inner upper left thigh, right deltoid, and lower abdominal wall.  Patient denies epistaxis, but does have occasional gum bleeding which occurred once and has not recurred again.  No history of heavy menstrual periods, patient has had none since 2007.  No history of hematuria, hematochezia, or melena.  Bruising has gotten so bad the patient has stopped doing mammograms because they cause significant ecchymosis on her breasts.  Patient did have previous surgeries, but the last one was in 1998 with arthroscopy of the right knee and proceeded without any bleeding complications.  Patient returns to the clinic to review results of recent coagulopathy testing.  Patient reports new sites of ecchymosis one on the left forearm measuring approximately 3 cm in one the right forearm with horseshoe-shaped measuring approximately 3 cm without subcutaneous hematoma.  Otherwise, no nuchal locations or complaints.  Oncological/hematological  History: --Labs, 10/09/14: WBC 4.6, Hgb 13.1, Plt 215 --Labs, 01/24/16: WBC 6.1, Hgb 13.2, Plt 194 --Labs, 05/08/16: WBC 5.5, Hgb 13.7, Plt 176 --Labs, 08/07/16: WBC 5.2, Hgb 12.8, Plt 165; PT 10.4, INR  1.0, aPTT 26  --Labs, 02/12/17: WBC 4.9, Hgb 11.9, Plt 138 --Labs, 05/13/17: WBC 4.1, Hgb 12.3, Plt 134; --Labs, 06/17/17: WBC 4.0, Hgb 11.8, Plt 118;    No history exists.    Medical History: Past Medical History:  Diagnosis Date  . Cardiomyopathy    EF 30-35% diagnosed 02/2008  . Central obesity   . Gout    left knee  . HTN (hypertension)     Surgical History: Past Surgical History:  Procedure Laterality Date  . KNEE SURGERY    . TONSILLECTOMY      Family History: Family History  Problem Relation Age of Onset  . Hypertension Mother     Social History: Social History   Socioeconomic History  . Marital status: Married    Spouse name: Not on file  . Number of children: 2  . Years of education: Not on file  . Highest education level: Not on file  Occupational History  . Occupation: [employer] officer  . Financial resource strain: Not on file  . Food insecurity:    Worry: Not on file    Inability: Not on file  . Transportation needs:    Medical: Not on file    Non-medical: Not on file  Tobacco Use  . Smoking status: Former Smoker    Last attempt to quit: 02/19/1984    Years since quitting: 33.4  . Smokeless tobacco: Never Used  Substance and Sexual Activity  . Alcohol use: Not Currently  . Drug use: Never  . Sexual activity: Not on file  Lifestyle  . Physical activity:    Days per week: Not on file    Minutes per session: Not on file  . Stress: Not on file  Relationships  . Social connections:    Talks on phone: Not on file    Gets together: Not on file    Attends religious service: Not on file    Active member of club or organization: Not on file    Attends meetings of clubs or organizations: Not on file    Relationship status: Not on file  . Intimate partner violence:    Fear of current or ex partner: Not on file    Emotionally abused: Not on file    Physically abused: Not on file    Forced sexual activity: Not on file  Other Topics  Concern  . Not on file  Social History Narrative  . Not on file    Allergies: Allergies  Allergen Reactions  . Sulfa Antibiotics Shortness Of Breath    Medications:  Current Outpatient Medications  Medication Sig Dispense Refill  . amLODipine (NORVASC) 10 MG tablet TAKE 1 TABLET BY MOUTH DAILY 30 tablet 11  . candesartan (ATACAND) 16 MG tablet TAKE 1 TABLET BY MOUTH EVERY DAY 90 tablet 3  . carvedilol (COREG) 6.25 MG tablet TAKE 1 TABLET BY MOUTH TWICE DAILY WITH MEAL 180 tablet 3  . Cholecalciferol (VITAMIN D3) 2000 units capsule Take 2,000 Units by mouth daily.    . furosemide (LASIX) 20 MG tablet Take 0.5 tablets (10 mg total) by mouth daily. 15 tablet 9  . hydrALAZINE (APRESOLINE) 25 MG tablet TAKE 1 TABLET BY MOUTH TWICE DAILY 180 tablet 3  . meloxicam (MOBIC) 15 MG tablet Take  15 mg by mouth daily as needed for pain.     . meloxicam (MOBIC) 7.5 MG tablet TAKE 1 TABLET BY MOUTH TWICE DAILY AS NEEDED 60 tablet 0  . Multiple Vitamin (MULTIVITAMIN) capsule Take 1 capsule by mouth daily.      . simvastatin (ZOCOR) 20 MG tablet Take 20 mg by mouth daily.   5  . traMADol (ULTRAM) 50 MG tablet Take 1 tablet (50 mg total) by mouth 2 (two) times daily. 30 tablet 0  . valsartan (DIOVAN) 320 MG tablet Take 1 tablet (320 mg total) by mouth daily. (Patient not taking: Reported on 06/17/2017) 30 tablet 1   No current facility-administered medications for this visit.     Review of Systems: Review of Systems  HENT:   Negative for mouth sores and nosebleeds.        Gum bleeding  Respiratory: Negative for hemoptysis.   Gastrointestinal: Negative for blood in stool and diarrhea.  Genitourinary: Negative for menstrual problem and vaginal bleeding.   Hematological: Negative for adenopathy. Bruises/bleeds easily.  All other systems reviewed and are negative.    PHYSICAL EXAMINATION Blood pressure (!) 144/76, pulse 70, temperature 97.9 F (36.6 C), temperature source Oral, resp. rate 18,  height 5' 6"  (1.676 m), weight 192 lb (87.1 kg), SpO2 100 %.  ECOG PERFORMANCE STATUS: 0 - Asymptomatic  Physical Exam  Constitutional: She is oriented to person, place, and time. She appears well-developed and well-nourished. No distress.  HENT:  Head: Normocephalic and atraumatic.  Mouth/Throat: Oropharynx is clear and moist. No oropharyngeal exudate.  Eyes: Pupils are equal, round, and reactive to light. Conjunctivae and EOM are normal. No scleral icterus.  Neck: No thyromegaly present.  Cardiovascular: Normal rate, regular rhythm and intact distal pulses. Exam reveals no gallop and no friction rub.  No murmur heard. Pulmonary/Chest: Effort normal and breath sounds normal. No stridor. No respiratory distress. She has no wheezes. She has no rales.  Abdominal: Soft. Bowel sounds are normal. She exhibits no distension and no mass. There is no tenderness. There is no guarding.  Musculoskeletal: She exhibits no edema.  Lymphadenopathy:    She has no cervical adenopathy.  Neurological: She is alert and oriented to person, place, and time. She displays normal reflexes. No cranial nerve deficit or sensory deficit.  Skin: Skin is warm and dry. No rash noted. She is not diaphoretic. No erythema. No pallor.  Scattered ecchymosis without palpable cutaneous hematomas.  One on the upper left thigh and another one on the right deltoid region.     LABORATORY DATA: I have personally reviewed the data as listed: No visits with results within 1 Week(s) from this visit.  Latest known visit with results is:  Clinical Support on 06/17/2017  Component Date Value Ref Range Status  . Fibrinogen 06/17/2017 291  210 - 475 mg/dL Final   Performed at [facility] [address] [place], [place] [address]  . Prothrombin Time 06/17/2017 12.9  11.4 - 15.2 seconds Final  . INR 06/17/2017 0.98   Final   Performed at [facility], [place] [address], [place], [place] [address]   . aPTT 06/17/2017 31  24 - 36 seconds Final   Performed at [facility], [place] [address], [place], [place] [address]  . Coagulation Factor VIII 06/17/2017 128  57 - 163 % Final   Comment: (NOTE) **Effective Jul 15, 2017 Factor VIII Activity reference**  interval will be changing to:  56 - 140%   .  Ristocetin Co-factor, Plasma 06/17/2017 117  50 - 200 % Final   Comment: (NOTE) Performed At: [facility] [place], [place] [id number] Rush Farmer MD OF:[id number]   . Von Willebrand Antigen, Plasma 06/17/2017 126  50 - 200 % Final   Comment: (NOTE) This test was developed and its performance characteristics determined by [facility]. It has not been cleared or approved by the Food and Drug Administration. Performed at [facility], [place] [address], [place], [place] [address]   . LDH 06/17/2017 230  125 - 245 U/L Final   Performed at [facility], [place] [address], [place], [place] [address]  . Sodium 06/17/2017 143  136 - 145 mmol/L Final  . Potassium 06/17/2017 4.0  3.5 - 5.1 mmol/L Final  . Chloride 06/17/2017 109  98 - 109 mmol/L Final  . CO2 06/17/2017 26  22 - 29 mmol/L Final  . Glucose, Bld 06/17/2017 96  70 - 140 mg/dL Final  . BUN 06/17/2017 13  7 - 26 mg/dL Final  . Creatinine 06/17/2017 0.95  0.60 - 1.10 mg/dL Final  . Calcium 06/17/2017 9.7  8.4 - 10.4 mg/dL Final  . Total Protein 06/17/2017 7.3  6.4 - 8.3 g/dL Final  . Albumin 06/17/2017 4.2  3.5 - 5.0 g/dL Final  . AST 06/17/2017 18  5 - 34 U/L Final  . ALT 06/17/2017 19  0 - 55 U/L Final  . Alkaline Phosphatase 06/17/2017 89  40 - 150 U/L Final  . Total Bilirubin 06/17/2017 0.5  0.2 - 1.2 mg/dL Final  . GFR, Est Non Af Am 06/17/2017 >60  >60 mL/min Final  . GFR, Est AFR Am 06/17/2017 >60  >60 mL/min Final   Comment: (NOTE) The eGFR has been calculated using the CKD EPI equation. This calculation has not been validated in all  clinical situations. eGFR's persistently <60 mL/min signify possible Chronic Kidney Disease.   Georgiann Hahn gap 06/17/2017 8  3 - 11 Final   Performed at [facility], [place] [address], [place], [place] [address]  . WBC Count 06/17/2017 4.0  3.9 - 10.3 K/uL Final  . RBC 06/17/2017 3.81  3.70 - 5.45 MIL/uL Final  . Hemoglobin 06/17/2017 11.8  11.6 - 15.9 g/dL Final  . HCT 06/17/2017 36.0  34.8 - 46.6 % Final  . MCV 06/17/2017 94.6  79.5 - 101.0 fL Final  . MCH 06/17/2017 31.0  25.1 - 34.0 pg Final  . MCHC 06/17/2017 32.8  31.5 - 36.0 g/dL Final  . RDW 06/17/2017 17.0* 11.2 - 14.5 % Final  . Platelet Count 06/17/2017 118* 145 - 400 K/uL Final  . Neutrophils Relative % 06/17/2017 54  % Final  . Neutro Abs 06/17/2017 2.2  1.5 - 6.5 K/uL Final  . Lymphocytes Relative 06/17/2017 36  % Final  . Lymphs Abs 06/17/2017 1.4  0.9 - 3.3 K/uL Final  . Monocytes Relative 06/17/2017 9  % Final  . Monocytes Absolute 06/17/2017 0.4  0.1 - 0.9 K/uL Final  . Eosinophils Relative 06/17/2017 1  % Final  . Eosinophils Absolute 06/17/2017 0.0  0.0 - 0.5 K/uL Final  . Basophils Relative 06/17/2017 0  % Final  . Basophils Absolute 06/17/2017 0.0  0.0 - 0.1 K/uL Final   Performed at [facility], [place] [address], [place], [place] [address]  . PFA Interpretation 06/17/2017          Final   Comment: Platelet  function is normal. If patient history/physical examination give strong indication of a bleeding disorder repeat testing for confirmation.        Results of the test should always be interpreted in conjunction with the patient's medical history, clinical presentation and medication history. Patients with Hematocrit values <35.0% or Platelet counts <150,000/uL may result in values above the Laboratory established reference range.   . Collagen / Epinephrine 06/17/2017 192  0 - 193 seconds Final   Performed at [facility], [address] [place], [place]  [address]  . Interpretation 06/17/2017 Note   Final   Comment: (NOTE) ------------------------------- COAGULATION: VON WILLEBRAND FACTOR ASSESSMENT CURRENT RESULTS ASSESSMENT The VWF:Ag is normal. The VWF:RCo is normal. The FVIII is normal. VON WILLEBRAND FACTOR ASSESSMENT CURRENT RESULTS INTERPRETATION - These results are not consistent with a diagnosis of VWD according to the current NHLBI guideline. VON WILLEBRAND FACTOR ASSESSMENT - Results may be falsely elevated and possibly falsely normal as VWF and FVIII may increase in samples drawn from patients (particularly children) who are visibly stressed at the time of phlebotomy, as acute phase reactants, or in response to certain drug therapies such as desmopressin. Repeat testing may be necessary before excluding a diagnosis of VWD especially if the clinical suspicion is high for an underlying bleeding disorder. The setting for phlebotomy should be as calm as possible and patients should be encouraged to sit quietly prior to the blood draw. VON WILLEBRAND FACTOR ASSESSMENT                           DEFINITIONS - VWD - von Willebrand disease; VWF - von Willebrand factor; VWF:Ag - VWF antigen; VWF:RCo - VWF ristocetin cofactor activity; FVIII - factor VIII activity. - MEDICAL DIRECTOR: For questions regarding panel interpretation, please contact Jake Bathe, M.D. at [facility] at [phone]. ------------------------------- DISCLAIMER These assessments and interpretations are provided as a convenience in support of the physician-patient relationship and are not intended to replace the physician's clinical judgment. They are derived from national guidelines in addition to other evidence and expert opinion. The clinician should consider this information within the context of clinical opinion and the individual patient. SEE GUIDANCE FOR VON WILLEBRAND FACTOR ASSESSMENT: (1) The National Heart, Lung and  Blood Institute. The Diagnosis, Evaluation and Management of von Willebrand Disease. Janeal Holmes, MD: [place]                          n [phone]. 2007. Available at vSpecials.com.pt. (2) Daryl Eastern et al. [place] J Hematol. 2009; 84(6):366-370. (3) [place]. 2004;10(3):199-217. (4) Pasi KJ et al. Haemophilia. 2004; 10(3):218-231. Performed At: [facility] [place], [place] [id number] Thomasene Ripple MD OV:[id number] Performed at [facility], [place] [address], [place], [place] [address]        Ardath Sax, MD

## 2017-07-24 DIAGNOSIS — K5730 Diverticulosis of large intestine without perforation or abscess without bleeding: Secondary | ICD-10-CM | POA: Diagnosis not present

## 2017-07-24 DIAGNOSIS — Z1211 Encounter for screening for malignant neoplasm of colon: Secondary | ICD-10-CM | POA: Diagnosis not present

## 2017-07-28 ENCOUNTER — Ambulatory Visit
Admission: RE | Admit: 2017-07-28 | Discharge: 2017-07-28 | Disposition: A | Discharge status: Home / Self Care | Payer: 59 | Source: Ambulatory Visit | Attending: Family Medicine | Admitting: Family Medicine

## 2017-07-28 DIAGNOSIS — Z1231 Encounter for screening mammogram for malignant neoplasm of breast: Secondary | ICD-10-CM

## 2017-08-11 ENCOUNTER — Telehealth: Payer: Self-pay

## 2017-08-11 NOTE — Telephone Encounter (Signed)
Patient called stating that she has some new bruising that she is concerned about. Dr. Irene Limbo made aware and stated patient can come for an office visit at next appointment day he has available and patient can also call her PCP to be seen. Scheduling message sent. Patient verbalized understanding.

## 2017-08-13 ENCOUNTER — Telehealth: Payer: Self-pay

## 2017-08-13 NOTE — Telephone Encounter (Signed)
Left a detailed voice message concerning a appointment that was scheduled for the patient from her o/v with [facility]. Per 6/24 sch msg

## 2017-08-18 NOTE — Progress Notes (Signed)
HEMATOLOGY/ONCOLOGY CONSULTATION NOTE  Date of Service: 08/19/2017  Patient Care Team: Carol Ada, MD as PCP - General (Family Medicine)  CHIEF COMPLAINTS/PURPOSE OF CONSULTATION:  Spontaneous bruising   HISTORY OF PRESENTING ILLNESS:   Karen Waller is a wonderful 62 y.o. female who has been previously followed by my colleague Dr. Grace Isaac for evaluation and management of Spontaneous bruising. The pt reports that she is doing well overall.   The pt reports that she has continued to have spontaneous bruising, without raised lumps. She first noticed this in the last two years and takes Meloxicam and Amlodipine regularly, and aspirin very rarely. She also notes that she does not always bruise in response to bumping into objects.   She denies any GI bleeding, large muscle hematoma, bleeding into her joints. She notes that she did not have heavy periods in the past. She has had knee surgery and a tonsillectomy without bleeding complications.   Most recent lab results (06/17/17) of CBC w/diff and CMP is as follows: all values are WNL except for RDW at 17.0, PLT at 118k.  On review of systems, pt reports bruising easily, and denies fevers, chills, night sweats, abdominal pains, leg swelling, and any other symptoms.   On PMHx the pt reports tonsillectomy and knee surgery. On Family Hx the pt denies blood disorders, clotting disorders.   MEDICAL HISTORY:  Past Medical History:  Diagnosis Date  . Cardiomyopathy    EF 30-35% diagnosed 02/2008  . Central obesity   . Gout    left knee  . HTN (hypertension)     SURGICAL HISTORY: Past Surgical History:  Procedure Laterality Date  . KNEE SURGERY    . TONSILLECTOMY      SOCIAL HISTORY: Social History   Socioeconomic History  . Marital status: Married    Spouse name: Not on file  . Number of children: 2  . Years of education: Not on file  . Highest education level: Not on file  Occupational History  . 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 Financial resource strain: Not on file  . Food insecurity:    Worry: Not on file    Inability: Not on file  . Transportation needs:    Medical: Not on file    Non-medical: Not on file  Tobacco Use  . Smoking status: Former Smoker    Last attempt to quit: 02/19/1984    Years since quitting: 33.5  . Smokeless tobacco: Never Used  Substance and Sexual Activity  . Alcohol use: Not Currently  . Drug use: Never  . Sexual activity: Not on file  Lifestyle  . Physical activity:    Days per week: Not on file    Minutes per session: Not on file  . Stress: Not on file  Relationships  . Social connections:    Talks on phone: Not on file    Gets together: Not on file    Attends religious service: Not on file    Active member of club or organization: Not on file    Attends meetings of clubs or organizations: Not on file    Relationship status: Not on file  . Intimate partner violence:    Fear of current or ex partner: Not on file    Emotionally abused: Not on file    Physically abused: Not on file    Forced sexual activity: Not on file  Other Topics Concern  . Not on file  Social History Narrative  .  Not on file    FAMILY HISTORY: Family History  Problem Relation Age of Onset  . Hypertension Mother     ALLERGIES:  is allergic to sulfa antibiotics.  MEDICATIONS:  Current Outpatient Medications  Medication Sig Dispense Refill  . amLODipine (NORVASC) 10 MG tablet TAKE 1 TABLET BY MOUTH DAILY 30 tablet 11  . candesartan (ATACAND) 16 MG tablet TAKE 1 TABLET BY MOUTH EVERY DAY 90 tablet 3  . carvedilol (COREG) 6.25 MG tablet TAKE 1 TABLET BY MOUTH TWICE DAILY WITH MEAL 180 tablet 3  . Cholecalciferol (VITAMIN D3) 2000 units capsule Take 2,000 Units by mouth daily.    . furosemide (LASIX) 20 MG tablet Take 0.5 tablets (10 mg total) by mouth daily. 15 tablet 9  . hydrALAZINE (APRESOLINE) 25 MG tablet TAKE 1 TABLET BY MOUTH TWICE DAILY 180 tablet 3  .  meloxicam (MOBIC) 15 MG tablet Take 15 mg by mouth daily as needed for pain.     . meloxicam (MOBIC) 7.5 MG tablet TAKE 1 TABLET BY MOUTH TWICE DAILY AS NEEDED 60 tablet 0  . Multiple Vitamin (MULTIVITAMIN) capsule Take 1 capsule by mouth daily.      . simvastatin (ZOCOR) 20 MG tablet Take 20 mg by mouth daily.   5  . traMADol (ULTRAM) 50 MG tablet Take 1 tablet (50 mg total) by mouth 2 (two) times daily. (Patient not taking: Reported on 08/19/2017) 30 tablet 0  . valsartan (DIOVAN) 320 MG tablet Take 1 tablet (320 mg total) by mouth daily. (Patient not taking: Reported on 06/17/2017) 30 tablet 1   No current facility-administered medications for this visit.     REVIEW OF SYSTEMS:    10 Point review of Systems was done is negative except as noted above.  PHYSICAL EXAMINATION:  . Vitals:   08/19/17 1515  BP: (!) 150/76  Pulse: 79  Resp: 18  Temp: 98.7 F (37.1 C)  SpO2: 98%   Filed Weights   08/19/17 1515  Weight: 198 lb 8 oz (90 kg)   .Body mass index is 32.04 kg/m.  GENERAL:alert, in no acute distress and comfortable SKIN: no acute rashes, no significant lesions EYES: conjunctiva are pink and non-injected, sclera anicteric OROPHARYNX: MMM, no exudates, no oropharyngeal erythema or ulceration NECK: supple, no JVD LYMPH:  no palpable lymphadenopathy in the cervical, axillary or inguinal regions LUNGS: clear to auscultation b/l with normal respiratory effort HEART: regular rate & rhythm ABDOMEN:  normoactive bowel sounds , non tender, not distended. Extremity: no pedal edema PSYCH: alert & oriented x 3 with fluent speech NEURO: no focal motor/sensory deficits  LABORATORY DATA:  I have reviewed the data as listed  . CBC Latest Ref Rng & Units 06/17/2017 10/10/2014 04/08/2010  WBC 3.9 - 10.3 K/uL 4.0 4.9 -  Hemoglobin 11.6 - 15.9 g/dL 11.8 12.6 14.3  Hematocrit 34.8 - 46.6 % 36.0 38.0 42.0  Platelets 145 - 400 K/uL 118(L) 225.0 -    . CMP Latest Ref Rng & Units  06/17/2017 04/21/2014 03/18/2014  Glucose 70 - 140 mg/dL 96 104(H) 94  BUN 7 - 26 mg/dL 13 27(H) 21  Creatinine 0.60 - 1.10 mg/dL 0.95 1.11 1.18  Sodium 136 - 145 mmol/L 143 141 144  Potassium 3.5 - 5.1 mmol/L 4.0 4.0 4.0  Chloride 98 - 109 mmol/L 109 107 112  CO2 22 - 29 mmol/L 26 28 27   Calcium 8.4 - 10.4 mg/dL 9.7 9.0 9.0  Total Protein 6.4 - 8.3 g/dL 7.3 - -  Total Bilirubin 0.2 - 1.2 mg/dL 0.5 - -  Alkaline Phos 40 - 150 U/L 89 - -  AST 5 - 34 U/L 18 - -  ALT 0 - 55 U/L 19 - -   Component     Latest Ref Rng & Units 06/17/2017  Coagulation Factor VIII     57 - 163 % 128  Ristocetin Co-factor, Plasma     50 - 200 % 117  Von Willebrand Antigen, Plasma     50 - 200 % 126  Prothrombin Time     11.4 - 15.2 seconds 12.9  INR      0.98  PFA Interpretation        Collagen / Epinephrine     0 - 193 seconds 192  Fibrinogen     210 - 475 mg/dL 291  APTT     24 - 36 seconds 31  LDH     125 - 245 U/L 230    RADIOGRAPHIC STUDIES: I have personally reviewed the radiological images as listed and agreed with the findings in the report. Mm Screening Breast Tomo Bilateral  Result Date: 07/29/2017 CLINICAL DATA:  Screening. EXAM: DIGITAL SCREENING BILATERAL MAMMOGRAM WITH TOMO AND CAD COMPARISON:  Previous exam(s). ACR Breast Density Category b: There are scattered areas of fibroglandular density. FINDINGS: There are no findings suspicious for malignancy. Images were processed with CAD. IMPRESSION: No mammographic evidence of malignancy. A result letter of this screening mammogram will be mailed directly to the patient. RECOMMENDATION: Screening mammogram in one year. (Code:SM-B-01Y) BI-RADS CATEGORY  1: Negative. Electronically Signed   By: Ammie Ferrier M.D.   On: 07/29/2017 09:05    ASSESSMENT & PLAN:   62 y.o. female with  1. Minor Spontaneous?/undetected trauma causing bruising PLAN -Discussed patient's most recent labs from 06/17/17, PLT were normal at 118k -No ecchymosis  with bruising  -Clotting parameters normal from 06/17/17 with normal PT, aPTT and VWD panel and platelet function testing. -would not anticipate excess brusing from platelet counts of 118k -Discussed that Meloxicam is known to cause bruising -Recommend taking 250-500mg  Vitamin C -Use electric shaver or e-pilator instead of razor for normal shaving -Moisturize skin regularly, could use Vitamin E oil as well -Recommend Tylenol for knee pain and try eliminating Meloxicam if able to for a short period and observe if bruising continues -Pt will continue to monitor for skin rashes and raised bruises  -no overt evidence of significant bleeding disorder clinically or based on screening labs  RTC with Dr Irene Limbo as needed  All of the patients questions were answered with apparent satisfaction. The patient knows to call the clinic with any problems, questions or concerns.  The total time spent in the appt was 25 minutes and more than 50% was on counseling and direct patient cares.    Sullivan Lone MD MS AAHIVMS [facility] Hematology/Oncology Physician [facility]  (Office):       [phone] (Work cell):  [phone] (Fax):           [phone]  08/19/2017 4:10 PM  I, Baldwin Jamaica, am acting as a Education administrator for Dr Irene Limbo.   .I have reviewed the above documentation for accuracy and completeness, and I agree with the above. Brunetta Genera MD

## 2017-08-19 ENCOUNTER — Encounter: Payer: Self-pay | Admitting: Hematology

## 2017-08-19 ENCOUNTER — Inpatient Hospital Stay: Payer: 59 | Attending: Hematology and Oncology | Admitting: Hematology

## 2017-08-19 VITALS — BP 150/76 | HR 79 | Temp 98.7°F | Resp 18 | Ht 66.0 in | Wt 198.5 lb

## 2017-08-19 DIAGNOSIS — Z882 Allergy status to sulfonamides status: Secondary | ICD-10-CM | POA: Diagnosis not present

## 2017-08-19 DIAGNOSIS — I10 Essential (primary) hypertension: Secondary | ICD-10-CM | POA: Diagnosis not present

## 2017-08-19 DIAGNOSIS — Z87891 Personal history of nicotine dependence: Secondary | ICD-10-CM | POA: Diagnosis not present

## 2017-08-19 DIAGNOSIS — R233 Spontaneous ecchymoses: Secondary | ICD-10-CM | POA: Diagnosis not present

## 2017-08-19 DIAGNOSIS — R238 Other skin changes: Secondary | ICD-10-CM

## 2017-08-19 DIAGNOSIS — Z79899 Other long term (current) drug therapy: Secondary | ICD-10-CM | POA: Insufficient documentation

## 2017-08-20 ENCOUNTER — Telehealth: Payer: Self-pay

## 2017-08-20 NOTE — Telephone Encounter (Signed)
Per 7/2 no los 

## 2017-08-26 DIAGNOSIS — T148XXA Other injury of unspecified body region, initial encounter: Secondary | ICD-10-CM | POA: Diagnosis not present

## 2017-08-26 DIAGNOSIS — R002 Palpitations: Secondary | ICD-10-CM | POA: Diagnosis not present

## 2017-09-17 DIAGNOSIS — T148XXA Other injury of unspecified body region, initial encounter: Secondary | ICD-10-CM | POA: Diagnosis not present

## 2017-11-13 DIAGNOSIS — D696 Thrombocytopenia, unspecified: Secondary | ICD-10-CM | POA: Diagnosis not present

## 2017-11-13 DIAGNOSIS — I10 Essential (primary) hypertension: Secondary | ICD-10-CM | POA: Diagnosis not present

## 2017-11-13 DIAGNOSIS — E559 Vitamin D deficiency, unspecified: Secondary | ICD-10-CM | POA: Diagnosis not present

## 2017-11-13 DIAGNOSIS — E782 Mixed hyperlipidemia: Secondary | ICD-10-CM | POA: Diagnosis not present

## 2017-12-25 DIAGNOSIS — D696 Thrombocytopenia, unspecified: Secondary | ICD-10-CM | POA: Diagnosis not present

## 2018-01-06 ENCOUNTER — Telehealth: Payer: Self-pay | Admitting: *Deleted

## 2018-01-06 NOTE — Telephone Encounter (Signed)
Patient left VM stating she had labs done at her PCP last week and asked if she still needed them for 11/20 appt with Dr. Irene Limbo. Lab results not visible in Epic. Contacted patient and left VM that labs would be needed 11/20 for appt with Dr. Irene Limbo as different MDs order different lab tests.

## 2018-01-06 NOTE — Progress Notes (Signed)
HEMATOLOGY/ONCOLOGY CONSULTATION NOTE  Date of Service: 01/07/2018  Patient Care Team: Carol Ada, MD as PCP - General (Family Medicine)  CHIEF COMPLAINTS/PURPOSE OF CONSULTATION:  Spontaneous bruising   HISTORY OF PRESENTING ILLNESS:   Karen Waller is a wonderful 62 y.o. female who has been previously followed by my colleague Dr. Grace Isaac for evaluation and management of Spontaneous bruising. The pt reports that she is doing well overall.   The pt reports that she has continued to have spontaneous bruising, without raised lumps. She first noticed this in the last two years and takes Meloxicam and Amlodipine regularly, and aspirin very rarely. She also notes that she does not always bruise in response to bumping into objects.   She denies any GI bleeding, large muscle hematoma, bleeding into her joints. She notes that she did not have heavy periods in the past. She has had knee surgery and a tonsillectomy without bleeding complications.   Most recent lab results (06/17/17) of CBC w/diff and CMP is as follows: all values are WNL except for RDW at 17.0, PLT at 118k.  On review of systems, pt reports bruising easily, and denies fevers, chills, night sweats, abdominal pains, leg swelling, and any other symptoms.   On PMHx the pt reports tonsillectomy and knee surgery. On Family Hx the pt denies blood disorders, clotting disorders.  Interval History:   [name] returns today for management and evaluation of easy bruisability. The patient's last visit with [place] was on 08/19/17. The pt reports that she is doing well overall.   The pt reports that she is continuing to bruise easily. She notes that she currently has a mild bruise on her right forearm. She gets bruises on her thighs, abdomen, and arms. She feels that her bruises are getting bigger. The pt denies skin rashes. She also denies sustaining any trauma. The pt notes that she has a [place] but denies playing  roughly with her dog. The pt notes that she moves 10-15 pound items at work in [employer] every day.   She notes that her most recent abdominal bruise left a knot which is slowly softening. She denies any thickness to the bruise. The pt also notes that her bruises are not painful.    She denies nose bleeds, gum bleeds, red/swollen/painful joints, blood in the urine, blood in the stools. She denies any recent gout attacks since changing medications several years ago.  She notes that she takes Meloxicam about twice a month and only uses Tylenol as needed otherwise, not NSAIDs nor Tramadol.   The pt denies any recent infections. She notes that she has occasional minor aches in her hips and knees. She has never had blood transfusions, unsafe sexual exposure, or tattoos. The pt denies any alcohol use. She takes Vitamin D and Iron replacement. She used to take steroids for her gout attacks but denies any long term use.    The pt notes that her bruises began in 2016. She endorses stable energy levels. She denies fevers, chills, night sweats, or unexpected weight loss. She doesn't sleep very well, about 5 hours a night. She endorses eating a balanced diet with fruits and vegetables.   The pt moisturizes on a regular basis and denies shaving.   She notes that her last period was in 1997, and she historically had normal periods. She denies any known kidney problems. She denies any ear or hearing problems.   Most recent CBC from 12/25/17 revealed all values WNL  except for WBC at 3.3k, RBC at 3.74, HGB at 11.5, HCT at 34.8, RDW at 17.3, PLT at 93k, ANC at 1.2k  On review of systems, pt reports bruising easily, not sleeping well, moving her bowels well, and denies blood in the stools, nose bleeds, gum bleeds, recent infections, pain associated with bruises, abdominal pain, leg swelling, difficulty urinating, and any other symptoms.   MEDICAL HISTORY:  Past Medical History:  Diagnosis Date  .  Cardiomyopathy    EF 30-35% diagnosed 02/2008  . Central obesity   . Gout    left knee  . HTN (hypertension)     SURGICAL HISTORY: Past Surgical History:  Procedure Laterality Date  . KNEE SURGERY    . TONSILLECTOMY      SOCIAL HISTORY: Social History   Socioeconomic History  . Marital status: Married    Spouse name: Not on file  . Number of children: 2  . Years of education: Not on file  . Highest education level: Not on file  Occupational History  . Occupation: [employer] officer  . Financial resource strain: Not on file  . Food insecurity:    Worry: Not on file    Inability: Not on file  . Transportation needs:    Medical: Not on file    Non-medical: Not on file  Tobacco Use  . Smoking status: Former Smoker    Last attempt to quit: 02/19/1984    Years since quitting: 33.9  . Smokeless tobacco: Never Used  Substance and Sexual Activity  . Alcohol use: Not Currently  . Drug use: Never  . Sexual activity: Not on file  Lifestyle  . Physical activity:    Days per week: Not on file    Minutes per session: Not on file  . Stress: Not on file  Relationships  . Social connections:    Talks on phone: Not on file    Gets together: Not on file    Attends religious service: Not on file    Active member of club or organization: Not on file    Attends meetings of clubs or organizations: Not on file    Relationship status: Not on file  . Intimate partner violence:    Fear of current or ex partner: Not on file    Emotionally abused: Not on file    Physically abused: Not on file    Forced sexual activity: Not on file  Other Topics Concern  . Not on file  Social History Narrative  . Not on file    FAMILY HISTORY: Family History  Problem Relation Age of Onset  . Hypertension Mother     ALLERGIES:  is allergic to sulfa antibiotics.  MEDICATIONS:  Current Outpatient Medications  Medication Sig Dispense Refill  . amLODipine (NORVASC) 10 MG tablet TAKE 1  TABLET BY MOUTH DAILY 30 tablet 11  . candesartan (ATACAND) 16 MG tablet TAKE 1 TABLET BY MOUTH EVERY DAY 90 tablet 3  . carvedilol (COREG) 6.25 MG tablet TAKE 1 TABLET BY MOUTH TWICE DAILY WITH MEAL 180 tablet 3  . Cholecalciferol (VITAMIN D3) 2000 units capsule Take 2,000 Units by mouth daily.    . furosemide (LASIX) 20 MG tablet Take 0.5 tablets (10 mg total) by mouth daily. 15 tablet 9  . hydrALAZINE (APRESOLINE) 25 MG tablet TAKE 1 TABLET BY MOUTH TWICE DAILY 180 tablet 3  . meloxicam (MOBIC) 15 MG tablet Take 15 mg by mouth daily as needed for pain.     Marland Kitchen  meloxicam (MOBIC) 7.5 MG tablet TAKE 1 TABLET BY MOUTH TWICE DAILY AS NEEDED 60 tablet 0  . Multiple Vitamin (MULTIVITAMIN) capsule Take 1 capsule by mouth daily.      . simvastatin (ZOCOR) 20 MG tablet Take 20 mg by mouth daily.   5  . traMADol (ULTRAM) 50 MG tablet Take 1 tablet (50 mg total) by mouth 2 (two) times daily. (Patient not taking: Reported on 08/19/2017) 30 tablet 0  . valsartan (DIOVAN) 320 MG tablet Take 1 tablet (320 mg total) by mouth daily. (Patient not taking: Reported on 06/17/2017) 30 tablet 1   No current facility-administered medications for this visit.     REVIEW OF SYSTEMS:    A 10+ POINT REVIEW OF SYSTEMS WAS OBTAINED including neurology, dermatology, psychiatry, cardiac, respiratory, lymph, extremities, GI, GU, Musculoskeletal, constitutional, breasts, reproductive, HEENT.  All pertinent positives are noted in the HPI.  All others are negative.   PHYSICAL EXAMINATION:  Vitals:   01/07/18 1529  BP: (!) 144/68  Pulse: 67  Resp: 18  Temp: 98.2 F (36.8 C)  SpO2: 98%   Filed Weights   01/07/18 1529  Weight: 198 lb 11.2 oz (90.1 kg)   .Body mass index is 32.07 kg/m.  GENERAL:alert, in no acute distress and comfortable SKIN: no acute rashes, no significant lesions EYES: conjunctiva are pink and non-injected, sclera anicteric OROPHARYNX: MMM, no exudates, no oropharyngeal erythema or ulceration NECK:  supple, no JVD LYMPH:  no palpable lymphadenopathy in the cervical, axillary or inguinal regions LUNGS: clear to auscultation b/l with normal respiratory effort HEART: regular rate & rhythm ABDOMEN:  normoactive bowel sounds , non tender, not distended. No palpable hepatosplenomegaly.  Extremity: no pedal edema PSYCH: alert & oriented x 3 with fluent speech NEURO: no focal motor/sensory deficits   LABORATORY DATA:  I have reviewed the data as listed  . CBC Latest Ref Rng & Units 06/17/2017 10/10/2014 04/08/2010  WBC 3.9 - 10.3 K/uL 4.0 4.9 -  Hemoglobin 11.6 - 15.9 g/dL 11.8 12.6 14.3  Hematocrit 34.8 - 46.6 % 36.0 38.0 42.0  Platelets 145 - 400 K/uL 118(L) 225.0 -    . CMP Latest Ref Rng & Units 06/17/2017 04/21/2014 03/18/2014  Glucose 70 - 140 mg/dL 96 104(H) 94  BUN 7 - 26 mg/dL 13 27(H) 21  Creatinine 0.60 - 1.10 mg/dL 0.95 1.11 1.18  Sodium 136 - 145 mmol/L 143 141 144  Potassium 3.5 - 5.1 mmol/L 4.0 4.0 4.0  Chloride 98 - 109 mmol/L 109 107 112  CO2 22 - 29 mmol/L 26 28 27   Calcium 8.4 - 10.4 mg/dL 9.7 9.0 9.0  Total Protein 6.4 - 8.3 g/dL 7.3 - -  Total Bilirubin 0.2 - 1.2 mg/dL 0.5 - -  Alkaline Phos 40 - 150 U/L 89 - -  AST 5 - 34 U/L 18 - -  ALT 0 - 55 U/L 19 - -   Component     Latest Ref Rng & Units 06/17/2017  Coagulation Factor VIII     57 - 163 % 128  Ristocetin Co-factor, Plasma     50 - 200 % 117  Von Willebrand Antigen, Plasma     50 - 200 % 126  Prothrombin Time     11.4 - 15.2 seconds 12.9  INR      0.98  PFA Interpretation        Collagen / Epinephrine     0 - 193 seconds 192  Fibrinogen     210 -  475 mg/dL 291  APTT     24 - 36 seconds 31  LDH     125 - 245 U/L 230   Component     Latest Ref Rng & Units 01/08/2018  WBC     4.0 - 10.5 K/uL 3.1 (L)  RBC     3.87 - 5.11 MIL/uL 3.71 (L)  Hemoglobin     12.0 - 15.0 g/dL 11.1 (L)  HCT     36.0 - 46.0 % 35.4 (L)  MCV     80.0 - 100.0 fL 95.4  MCH     26.0 - 34.0 pg 29.9  MCHC     30.0  - 36.0 g/dL 31.4  RDW     11.5 - 15.5 % 16.4 (H)  Platelets     150 - 400 K/uL 104 (L)  nRBC     0.0 - 0.2 % 0.0  Neutrophils     % 37  NEUT#     1.7 - 7.7 K/uL 1.2 (L)  Lymphocytes     % 51  Lymphocyte #     0.7 - 4.0 K/uL 1.6  Monocytes Relative     % 12  Monocyte #     0.1 - 1.0 K/uL 0.4  Eosinophil     % 0  Eosinophils Absolute     0.0 - 0.5 K/uL 0.0  Basophil     % 0  Basophils Absolute     0.0 - 0.1 K/uL 0.0  Immature Granulocytes     % 0  Abs Immature Granulocytes     0.00 - 0.07 K/uL 0.00  Sodium     135 - 145 mmol/L 145  Potassium     3.5 - 5.1 mmol/L 4.0  Chloride     98 - 111 mmol/L 111  CO2     22 - 32 mmol/L 25  Glucose     70 - 99 mg/dL 117 (H)  BUN     8 - 23 mg/dL 12  Creatinine     0.44 - 1.00 mg/dL 1.10 (H)  Calcium     8.9 - 10.3 mg/dL 9.3  Total Protein     6.5 - 8.1 g/dL 7.2  Albumin     3.5 - 5.0 g/dL 3.8  AST     15 - 41 U/L 15  ALT     0 - 44 U/L 13  Alkaline Phosphatase     38 - 126 U/L 85  Total Bilirubin     0.3 - 1.2 mg/dL 0.5  GFR, Est Non African American     >60 mL/min 53 (L)  GFR, Est African American     >60 mL/min >60  Anion gap     5 - 15 9  Prothrombin Time     11.4 - 15.2 seconds 12.7  INR      0.96  APTT     24 - 36 seconds 30  Fibrinogen     210 - 475 mg/dL 272    RADIOGRAPHIC STUDIES: I have personally reviewed the radiological images as listed and agreed with the findings in the report. No results found.  ASSESSMENT & PLAN:   62 y.o. female with  1. Minor Spontaneous?/undetected trauma causing bruising 2. Thrombocytopenia 3. Neutropenia  PLAN -No ecchymosis with bruising  -Clotting parameters normal from 06/17/17 with normal PT, aPTT and VWD panel and platelet function testing -Discussed that Meloxicam is known to cause bruising, and her monthly use could be associated  with onset of her bruising -Recommend taking 250-500mg  Vitamin C -Moisturize skin regularly, could use Vitamin E oil as  well -Recommend Tylenol for knee pain and try eliminating Meloxicam if able to for a short period and observe if bruising continues -Pt will continue to monitor for skin rashes and raised bruises  -no overt evidence of significant bleeding disorder clinically or based on screening labs -Discussed pt labwork from 12/25/17; mild anemia with HGB at 11.5, ANC at 1.2k, PLT at 93k -Pt's PLT in 10/19/14 were normal at 215k when she was having abnormal bruising  -Thrombocytopenia alone does not explain her ease of bruising -Will order additional labs today including clotting factors - PT and aPTT, fibrinogen WNL -Recommend compression sleeves for venous support -Recommend Vitamin E oil -Will see the pt back in 6 months     Labs tomorrow RTC with Dr Irene Limbo in 6 months with labs   All of the patients questions were answered with apparent satisfaction. The patient knows to call the clinic with any problems, questions or concerns.  The total time spent in the appt was 20 minutes and more than 50% was on counseling and direct patient cares.    Sullivan Lone MD MS AAHIVMS [facility] Hematology/Oncology Physician [facility]  (Office):       [phone] (Work cell):  [phone] (Fax):           [phone]  01/07/2018 4:31 PM  I, Baldwin [place], am acting as a scribe for Dr. Sullivan Lone.   .I have reviewed the above documentation for accuracy and completeness, and I agree with the above. Brunetta Genera MD

## 2018-01-06 NOTE — Telephone Encounter (Signed)
Contacted patient. Per Dr. Irene Limbo, no labs will be needed prior to appt with him. If labs are needed, they will be ordered after appt. Patient verbalized understanding.

## 2018-01-07 ENCOUNTER — Inpatient Hospital Stay: Payer: 59

## 2018-01-07 ENCOUNTER — Inpatient Hospital Stay: Payer: 59 | Attending: Hematology | Admitting: Hematology

## 2018-01-07 ENCOUNTER — Telehealth: Payer: Self-pay | Admitting: Hematology

## 2018-01-07 VITALS — BP 144/68 | HR 67 | Temp 98.2°F | Resp 18 | Ht 66.0 in | Wt 198.7 lb

## 2018-01-07 DIAGNOSIS — Z79899 Other long term (current) drug therapy: Secondary | ICD-10-CM | POA: Diagnosis not present

## 2018-01-07 DIAGNOSIS — I10 Essential (primary) hypertension: Secondary | ICD-10-CM | POA: Diagnosis not present

## 2018-01-07 DIAGNOSIS — D696 Thrombocytopenia, unspecified: Secondary | ICD-10-CM | POA: Insufficient documentation

## 2018-01-07 DIAGNOSIS — Z6833 Body mass index (BMI) 33.0-33.9, adult: Secondary | ICD-10-CM | POA: Diagnosis not present

## 2018-01-07 DIAGNOSIS — R238 Other skin changes: Secondary | ICD-10-CM

## 2018-01-07 DIAGNOSIS — G4700 Insomnia, unspecified: Secondary | ICD-10-CM | POA: Diagnosis not present

## 2018-01-07 DIAGNOSIS — T148XXA Other injury of unspecified body region, initial encounter: Secondary | ICD-10-CM | POA: Diagnosis not present

## 2018-01-07 DIAGNOSIS — Z791 Long term (current) use of non-steroidal anti-inflammatories (NSAID): Secondary | ICD-10-CM | POA: Diagnosis not present

## 2018-01-07 DIAGNOSIS — Z87891 Personal history of nicotine dependence: Secondary | ICD-10-CM | POA: Diagnosis not present

## 2018-01-07 DIAGNOSIS — R233 Spontaneous ecchymoses: Secondary | ICD-10-CM | POA: Insufficient documentation

## 2018-01-07 DIAGNOSIS — Z882 Allergy status to sulfonamides status: Secondary | ICD-10-CM | POA: Diagnosis not present

## 2018-01-07 DIAGNOSIS — D709 Neutropenia, unspecified: Secondary | ICD-10-CM | POA: Diagnosis not present

## 2018-01-07 NOTE — Telephone Encounter (Signed)
Gave pt avs and calendar  °

## 2018-01-08 ENCOUNTER — Inpatient Hospital Stay: Payer: 59

## 2018-01-08 DIAGNOSIS — R233 Spontaneous ecchymoses: Secondary | ICD-10-CM

## 2018-01-08 DIAGNOSIS — D696 Thrombocytopenia, unspecified: Secondary | ICD-10-CM | POA: Diagnosis not present

## 2018-01-08 LAB — CMP (CANCER CENTER ONLY)
ALT: 13 U/L (ref 0–44)
ANION GAP: 9 (ref 5–15)
AST: 15 U/L (ref 15–41)
Albumin: 3.8 g/dL (ref 3.5–5.0)
Alkaline Phosphatase: 85 U/L (ref 38–126)
BILIRUBIN TOTAL: 0.5 mg/dL (ref 0.3–1.2)
BUN: 12 mg/dL (ref 8–23)
CHLORIDE: 111 mmol/L (ref 98–111)
CO2: 25 mmol/L (ref 22–32)
Calcium: 9.3 mg/dL (ref 8.9–10.3)
Creatinine: 1.1 mg/dL — ABNORMAL HIGH (ref 0.44–1.00)
GFR, EST NON AFRICAN AMERICAN: 53 mL/min — AB (ref >60)
Glucose, Bld: 117 mg/dL — ABNORMAL HIGH (ref 70–99)
POTASSIUM: 4 mmol/L (ref 3.5–5.1)
Sodium: 145 mmol/L (ref 135–145)
TOTAL PROTEIN: 7.2 g/dL (ref 6.5–8.1)

## 2018-01-08 LAB — CBC WITH DIFFERENTIAL (CANCER CENTER ONLY)
Abs Immature Granulocytes: 0 10*3/uL (ref 0.00–0.07)
BASOS PCT: 0 %
Basophils Absolute: 0 10*3/uL (ref 0.0–0.1)
EOS ABS: 0 10*3/uL (ref 0.0–0.5)
EOS PCT: 0 %
HCT: 35.4 % — ABNORMAL LOW (ref 36.0–46.0)
Hemoglobin: 11.1 g/dL — ABNORMAL LOW (ref 12.0–15.0)
Immature Granulocytes: 0 %
LYMPHS ABS: 1.6 10*3/uL (ref 0.7–4.0)
Lymphocytes Relative: 51 %
MCH: 29.9 pg (ref 26.0–34.0)
MCHC: 31.4 g/dL (ref 30.0–36.0)
MCV: 95.4 fL (ref 80.0–100.0)
MONO ABS: 0.4 10*3/uL (ref 0.1–1.0)
MONOS PCT: 12 %
NEUTROS PCT: 37 %
Neutro Abs: 1.2 10*3/uL — ABNORMAL LOW (ref 1.7–7.7)
Platelet Count: 104 10*3/uL — ABNORMAL LOW (ref 150–400)
RBC: 3.71 MIL/uL — ABNORMAL LOW (ref 3.87–5.11)
RDW: 16.4 % — AB (ref 11.5–15.5)
WBC Count: 3.1 10*3/uL — ABNORMAL LOW (ref 4.0–10.5)
nRBC: 0 % (ref 0.0–0.2)

## 2018-01-08 LAB — PROTIME-INR
INR: 0.96
PROTHROMBIN TIME: 12.7 s (ref 11.4–15.2)

## 2018-01-08 LAB — FIBRINOGEN: FIBRINOGEN: 272 mg/dL (ref 210–475)

## 2018-01-08 LAB — APTT: APTT: 30 s (ref 24–36)

## 2018-01-18 ENCOUNTER — Other Ambulatory Visit: Payer: Self-pay | Admitting: Cardiovascular Disease

## 2018-01-21 ENCOUNTER — Other Ambulatory Visit: Payer: Self-pay | Admitting: Cardiovascular Disease

## 2018-01-21 MED ORDER — AMLODIPINE BESYLATE 10 MG PO TABS: 10.0000 mg | ORAL_TABLET | Freq: Every day | ORAL | 0 refills | Status: DC

## 2018-01-21 NOTE — Telephone Encounter (Signed)
This encounter was created in error - please disregard.

## 2018-02-08 NOTE — Progress Notes (Signed)
Patient ID: Karen Waller, female   DOB: 08/14/1955, 62 y.o.   MRN: [id number]     Karen Waller is seen today for DCM, HTN palpitations and elevated lipids.. She denies syncope, SSCP, dyspnea or edema.. Her EF had been as low as 35%. She is tolerating BB and ARB with no palpitations. Work at [employer] is [employer] well. She is seeing a dietician for initial Rx of her elevated lipids. Compliant with meds  Diuretics can cause flairs in gout especially HCTZ Atacand worked better than Cozaar.   EF 47% by MRI 01/13/13.  No scar.  Last TTE 02/08/16 EF 45-50%   MRI 01/13/13 reviewed EF 47%  She is watching her salt but still overweight and sedentary. Walks with her [employer] a few times/week    Some fatigue Some bruising has seen a hematologist for this   Lab Results  Component Value Date   NA 145 01/08/2018   K 4.0 01/08/2018   CL 111 01/08/2018   CO2 25 01/08/2018    ROS: Denies fever, malais, weight loss, blurry vision, decreased visual acuity, cough, sputum, SOB, hemoptysis, pleuritic pain, palpitaitons, heartburn, abdominal pain, melena, lower extremity edema, claudication, or rash.  All other systems reviewed and negative  General: BP 136/80   Pulse 76   Ht 5\' 6"  (1.676 m)   Wt 198 lb 12.8 oz (90.2 kg)   SpO2 99%   BMI 32.09 kg/m  Affect appropriate Healthy:  appears stated age 25: normal Neck supple with no adenopathy JVP normal no bruits no thyromegaly Lungs clear with no wheezing and good diaphragmatic motion Heart:  S1/S2 no murmur, no rub, gallop or click PMI normal Abdomen: benighn, BS positve, no tenderness, no AAA no bruit.  No HSM or HJR Distal pulses intact with no bruits No edema Neuro non-focal Skin warm and dry No muscular weakness     Current Outpatient Medications  Medication Sig Dispense Refill  . amLODipine (NORVASC) 10 MG tablet Take 1 tablet (10 mg total) by mouth daily. Please keep upcoming appt in December for future refills. Thank you  30 tablet 0  . candesartan (ATACAND) 16 MG tablet Take 1 tablet (16 mg total) by mouth daily. Please keep upcoming appointment for further refills 30 tablet 0  . carvedilol (COREG) 6.25 MG tablet TAKE 1 TABLET BY MOUTH TWICE DAILY WITH MEAL 180 tablet 3  . Cholecalciferol (VITAMIN D3) 2000 units capsule Take 2,000 Units by mouth daily.    . furosemide (LASIX) 20 MG tablet Take 0.5 tablets (10 mg total) by mouth daily. 15 tablet 9  . hydrALAZINE (APRESOLINE) 25 MG tablet TAKE 1 TABLET BY MOUTH TWICE DAILY 180 tablet 3  . meloxicam (MOBIC) 15 MG tablet Take 15 mg by mouth daily as needed for pain.     . meloxicam (MOBIC) 7.5 MG tablet TAKE 1 TABLET BY MOUTH TWICE DAILY AS NEEDED 60 tablet 0  . Multiple Vitamin (MULTIVITAMIN) capsule Take 1 capsule by mouth daily.      . simvastatin (ZOCOR) 20 MG tablet Take 20 mg by mouth daily.   5   No current facility-administered medications for this visit.     Allergies  Sulfa antibiotics  Electrocardiogram:  10/14  SR rate 62 RAD nonspecific ST/T wave changes  01/25/14 SR rate 70  With sinus arrhythmia no significant change  01/25/15  SR ratre 62  Nonspecific ST changes 01/08/17 SR rate 65 normal   Assessment and Plan  HTN: Well controlled.  Continue current  medications and low sodium Dash type diet.    Palpitations:  Continue beta blocker Better with HTN control valsartan changed to atacand due to recall  DCM:  Euvolemic continue current meds echo 12/21/117 EF 45-50% % No clinical CHF will update TTE Discussed using entresto if EF less than 40%   Bruising:  F/u hematology she indicates PLT;s have been low   Jenkins Rouge

## 2018-02-13 ENCOUNTER — Encounter: Payer: Self-pay | Admitting: Cardiovascular Disease

## 2018-02-13 ENCOUNTER — Ambulatory Visit (INDEPENDENT_AMBULATORY_CARE_PROVIDER_SITE_OTHER): Payer: 59 | Admitting: Cardiovascular Disease

## 2018-02-13 VITALS — BP 136/80 | HR 76 | Ht 66.0 in | Wt 198.8 lb

## 2018-02-13 DIAGNOSIS — I420 Dilated cardiomyopathy: Secondary | ICD-10-CM | POA: Diagnosis not present

## 2018-02-13 DIAGNOSIS — R002 Palpitations: Secondary | ICD-10-CM

## 2018-02-13 DIAGNOSIS — I10 Essential (primary) hypertension: Secondary | ICD-10-CM

## 2018-02-13 NOTE — Patient Instructions (Addendum)
Medication Instructions:   If you need a refill on your cardiac medications before your next appointment, please call your pharmacy.   Lab work:  If you have labs (blood work) drawn today and your tests are completely normal, you will receive your results only by: Marland Kitchen MyChart Message (if you have MyChart) OR . A paper copy in the mail If you have any lab test that is abnormal or we need to change your treatment, we will call you to review the results.  Testing/Procedures: Your physician has requested that you have an echocardiogram. Echocardiography is a painless test that uses sound waves to create images of your heart. It provides your doctor with information about the size and shape of your heart and how well your heart's chambers and valves are working. This procedure takes approximately one hour. There are no restrictions for this procedure.  Follow-Up: At [facility], you and your health needs are our priority.  As part of our continuing mission to provide you with exceptional heart care, we have created designated Provider Care Teams.  These Care Teams include your primary Cardiologist (physician) and Advanced Practice Providers (APPs -  Physician Assistants and Nurse Practitioners) who all work together to provide you with the care you need, when you need it. You will need a follow up appointment in 1 years.  Please call our office 2 months in advance to schedule this appointment.  You may see Jenkins Rouge, MD or one of the following Advanced Practice Providers on your designated Care Team:   Truitt Merle, NP Cecilie Kicks, NP . Kathyrn Drown, NP

## 2018-02-17 ENCOUNTER — Other Ambulatory Visit: Payer: Self-pay | Admitting: Cardiovascular Disease

## 2018-02-19 ENCOUNTER — Other Ambulatory Visit (HOSPITAL_COMMUNITY): Payer: 59

## 2018-02-19 DIAGNOSIS — H6122 Impacted cerumen, left ear: Secondary | ICD-10-CM | POA: Diagnosis not present

## 2018-02-19 DIAGNOSIS — S39011A Strain of muscle, fascia and tendon of abdomen, initial encounter: Secondary | ICD-10-CM | POA: Diagnosis not present

## 2018-02-19 DIAGNOSIS — K5901 Slow transit constipation: Secondary | ICD-10-CM | POA: Diagnosis not present

## 2018-03-04 DIAGNOSIS — T148XXA Other injury of unspecified body region, initial encounter: Secondary | ICD-10-CM | POA: Diagnosis not present

## 2018-03-04 DIAGNOSIS — E559 Vitamin D deficiency, unspecified: Secondary | ICD-10-CM | POA: Diagnosis not present

## 2018-03-04 DIAGNOSIS — R1030 Lower abdominal pain, unspecified: Secondary | ICD-10-CM | POA: Diagnosis not present

## 2018-03-24 ENCOUNTER — Encounter (INDEPENDENT_AMBULATORY_CARE_PROVIDER_SITE_OTHER): Payer: Self-pay | Admitting: Orthopaedic Surgery

## 2018-03-24 ENCOUNTER — Ambulatory Visit (INDEPENDENT_AMBULATORY_CARE_PROVIDER_SITE_OTHER): Payer: 59 | Admitting: Orthopaedic Surgery

## 2018-03-24 ENCOUNTER — Ambulatory Visit (INDEPENDENT_AMBULATORY_CARE_PROVIDER_SITE_OTHER): Payer: 59

## 2018-03-24 VITALS — Ht 66.0 in | Wt 198.8 lb

## 2018-03-24 DIAGNOSIS — M25552 Pain in left hip: Secondary | ICD-10-CM

## 2018-03-24 MED ORDER — DICLOFENAC SODIUM 1 % TD GEL: 4.0000 g | Freq: Four times a day (QID) | TRANSDERMAL | 6 refills | Status: DC | PRN

## 2018-03-24 NOTE — Progress Notes (Signed)
Office Visit Note   Patient: Karen Waller           Date of Birth: 09-09-1955           MRN: [id number] Visit Date: 03/24/2018              Requested by: Carol Ada, MD [address], [place] [address] PCP: Carol Ada, MD   Assessment & Plan: Visit Diagnoses:  1. Pain in left hip     Plan: Impression is left hip osteoarthritis and hip dysplasia.  We will refer the patient to Dr. Junius Roads for an intra-articular cortisone injection.  She will follow-up with [place] as needed.  Follow-Up Instructions: Return if symptoms worsen or fail to improve.   Orders:  Orders Placed This Encounter  Procedures  . XR HIP UNILAT W OR W/O PELVIS 2-3 VIEWS LEFT   No orders of the defined types were placed in this encounter.     Procedures: No procedures performed   Clinical Data: No additional findings.   Subjective: Chief Complaint  Patient presents with  . Left Hip - Pain    HPI patient is a pleasant 63 year old female who presents to our clinic today with left hip pain.  This began a few weeks ago without any known injury or change in activity.  The pain she has is primarily to the groin but does radiate to her lateral hip.  Pain is worse when she is lying on her side or externally rotates her hip such as to put on her tennis shoes.  She has been taking occasional meloxicam with moderate relief of symptoms.  She denies any radicular symptoms.  Review of Systems as detailed in HPI.  All others reviewed and are negative.   Objective: Vital Signs: Ht 5\' 6"  (1.676 m)   Wt 198 lb 12.8 oz (90.2 kg)   BMI 32.09 kg/m   Physical Exam well-developed and well-nourished female no acute distress.  Alert and oriented x3.  Ortho Exam examination of her left hip reveals positive logroll.  Marked tenderness over the trochanteric bursa.  Negative straight leg raise.  She is neurovascular intact distally.  Specialty Comments:  No specialty comments  available.  Imaging: Xr Hip Unilat W Or W/o Pelvis 2-3 Views Left  Result Date: 03/24/2018 Moderate joint space narrowing with evidence of bilateral hip dysplasia right greater than left    PMFS History: Patient Active Problem List   Diagnosis Date Noted  . Pain in left hip 03/24/2018  . Bruising, spontaneous 07/06/2017  . Left Achilles tendinitis 11/08/2016  . Conductive hearing loss, bilateral 08/08/2016  . Elevated lipids 12/16/2012  . HTN (hypertension) 01/05/2011  . CHF (congestive heart failure) ([place]) 10/30/2010  . Edema 10/30/2010  . Gouty arthropathy 03/17/2008  . OVERWEIGHT 03/17/2008  . ESSENTIAL HYPERTENSION, BENIGN 03/17/2008  . Non-ischemic cardiomyopathy ([place]) 03/17/2008  . PALPITATIONS, OCCASIONAL 03/17/2008   Past Medical History:  Diagnosis Date  . Cardiomyopathy    EF 30-35% diagnosed 02/2008  . Central obesity   . Gout    left knee  . HTN (hypertension)     Family History  Problem Relation Age of Onset  . Hypertension Mother     Past Surgical History:  Procedure Laterality Date  . KNEE SURGERY    . TONSILLECTOMY     Social History   Occupational History  . Occupation: [employer]  Tobacco Use  . Smoking status: Former Smoker    Last attempt to quit:  02/19/1984    Years since quitting: 34.1  . Smokeless tobacco: Never Used  Substance and Sexual Activity  . Alcohol use: Not Currently  . Drug use: Never  . Sexual activity: Not on file

## 2018-03-24 NOTE — Progress Notes (Signed)
Patient is here for possible ultrasound-guided left hip injection.  Upon further discussion, she states that her pain is much better after taking meloxicam for a couple days.  She cannot take it long-term due to bruising so we will try Voltaren gel and physical therapy.  If symptoms persist we will contemplate intra-articular injection.

## 2018-04-07 ENCOUNTER — Ambulatory Visit: Payer: 59 | Attending: Family Medicine | Admitting: Physical Therapy

## 2018-04-07 ENCOUNTER — Other Ambulatory Visit: Payer: Self-pay

## 2018-04-07 DIAGNOSIS — M25552 Pain in left hip: Secondary | ICD-10-CM | POA: Diagnosis not present

## 2018-04-07 NOTE — Therapy (Signed)
[place], [place], [address] Phone: [phone]   Fax:  [phone]  Physical Therapy Evaluation  Patient Details  Name: Karen Waller MRN: [id number] Date of Birth: 07-10-1955 Referring Provider (PT): Eunice Blase   Encounter Date: 04/07/2018  PT End of Session - 04/07/18 0933    Visit Number  1    Date for PT Re-Evaluation  06/02/18    PT Start Time  0933    PT Stop Time  1015    PT Time Calculation (min)  42 min    Activity Tolerance  Patient tolerated treatment well    Behavior During Therapy  [facility] for tasks assessed/performed       Past Medical History:  Diagnosis Date  . Cardiomyopathy    EF 30-35% diagnosed 02/2008  . Central obesity   . Gout    left knee  . HTN (hypertension)     Past Surgical History:  Procedure Laterality Date  . KNEE SURGERY    . TONSILLECTOMY      There were no vitals filed for this visit.   Subjective Assessment - 04/07/18 0941    Subjective  In December 2019 patient began feeling pain across pelvis with stepping up to get in her car. She had returned to working out in the gym so this may have contributed. She started back on spin bike which she had previously done years ago but had to stop due to the seat irritating her HS. She had been taking meloxicam for her knees and went off it due to bruising and noticed her left hip hurting.  She got to the point where she couldn't get out of bed. She went back on the Meloxicam at end of January and pain has resolved mostly, but she is starting to feel it intermittently again.    Pertinent History  HTN, arthritis, knee scope Rt, low back pain, bil hip bursitis    Diagnostic tests  xrays - Moderate joint space narrowing with evidence of bilateral hip dysplasia     Currently in Pain?  No/denies         [facility] PT Assessment - 04/07/18 0001      Assessment   Medical Diagnosis  left hip pain    Referring  Provider (PT)  Legrand Como Hilts    Onset Date/Surgical Date  01/18/18      Precautions   Precautions  Other (comment)    Precaution Comments  SULFA ALLERGY      Restrictions   Weight Bearing Restrictions  No      Balance Screen   Has the patient fallen in the past 6 months  No    Has the patient had a decrease in activity level because of a fear of falling?   No    Is the patient reluctant to leave their home because of a fear of falling?   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#      ROM / Strength   AROM / PROM / Strength  AROM;Strength      AROM   Overall AROM Comments  lumbar WFL; tighness in left QL  Strength   Overall Strength Comments  bil hip flex 4/5, bil hip ABD and ext  4+/5      Flexibility   Soft Tissue Assessment /Muscle Length  yes    Hamstrings  bil marked    Quadriceps  bil marked    ITB  WNL    Piriformis  WNL    Quadratus Lumborum  left tight      Palpation   Palpation comment  tender in left gluteus min/med, along left ITB and around greater trochanter      Special Tests    Special Tests  Hip Special Tests    Hip Special Tests   Saralyn Pilar (FABER) Test;Ober's Test;Hip Scouring      Saralyn Pilar [facility]) Test   Findings  Positive    Side  Left    Comments  neg right      Ober's Test   Findings  Negative    Side  Left      Hip Scouring   Findings  Positive    Side  Left    Comments  at 2-3 o'clock position; neg on right                Objective measurements completed on examination: See above findings.              PT Education - 04/07/18 1229    Education Details  HEP    Person(s) Educated  Patient    Methods  Explanation;Demonstration;Handout    Comprehension  Verbalized understanding;Returned  demonstration       PT Short Term Goals - 04/07/18 1238      PT SHORT TERM GOAL #1   Title  Ind with initial HEP    Time  2    Status  New    Target Date  04/21/18        PT Long Term Goals - 04/07/18 1238      PT LONG TERM GOAL #1   Title  Patient independent and compliant with hip flexibility stretches.    Time  8    Period  Weeks    Status  New    Target Date  06/02/18      PT LONG TERM GOAL #2   Title  Patient to demo 5/5 hip flex and ext strength.    Time  8    Status  New      PT LONG TERM GOAL #3   Title  Patient to be able to perform ADLS without pain in left hip.    Time  8    Period  Weeks    Status  New             Plan - 04/07/18 1229    Clinical Impression Statement  Patient presents to PT with mild c/o left hip pain after previously having significant pain beginning in Dec 2019. She attributes the decreased pain to going back on Meloxicam which she was taking for her knees. She has significant tightness in her HS and quads which may be contributing to her pain, but she also has moderate joint space narrowing in bil hips. She also demos hip weakness in her hip flexors and gluteals.     History and Personal Factors relevant to plan of care:  arthritis knees and hips; bil hip bursitis, HTN    Clinical Presentation  Stable    Clinical Decision Making  Low    Rehab Potential  Excellent    PT Frequency  2x / week   likely 1/wk due to pt request   PT Duration  8 weeks    PT Treatment/Interventions  ADLs/Self Care Home Management;Cryotherapy;Electrical Stimulation;Moist Heat;Ultrasound;Therapeutic exercise;Patient/family education;Manual techniques;Dry needling    PT Next Visit Plan  LE flexibility and strengthening; give stretch for left QL    PT Home Exercise Plan  stretches: HS, quad, ITB all with strap    Consulted and Agree with Plan of Care  Patient       Patient will benefit from skilled therapeutic intervention in order to improve the following  deficits and impairments:  Pain, Postural dysfunction, Decreased strength, Impaired flexibility  Visit Diagnosis: Pain in left hip - Plan: PT plan of care cert/re-cert     Problem List Patient Active Problem List   Diagnosis Date Noted  . Pain in left hip 03/24/2018  . Bruising, spontaneous 07/06/2017  . Left Achilles tendinitis 11/08/2016  . Conductive hearing loss, bilateral 08/08/2016  . Elevated lipids 12/16/2012  . HTN (hypertension) 01/05/2011  . CHF (congestive heart failure) ([place]) 10/30/2010  . Edema 10/30/2010  . Gouty arthropathy 03/17/2008  . OVERWEIGHT 03/17/2008  . ESSENTIAL HYPERTENSION, BENIGN 03/17/2008  . Non-ischemic cardiomyopathy ([place]) 03/17/2008  . PALPITATIONS, OCCASIONAL 03/17/2008    Madelyn Flavors PT 04/07/2018, 12:42 PM  [place], [place], [address] Phone: [phone]   Fax:  [phone]  Name: Karen Waller MRN: [id number] Date of Birth: 1955-10-10

## 2018-04-07 NOTE — Patient Instructions (Signed)
  Hamstring Stretch, Reclined (Strap, Doorframe)   Lengthen bottom leg on floor. Extend top leg along edge of doorframe or press foot up into yoga strap. Hold for 30-60 seconds. Repeat 3_ times each leg.  Quadriceps (Prone) DO ONLY ONE LEG AT A TIME   On stomach with sheet around ankles, knees together, hips down, pull heels toward bottom. Keep hips flat. Hold __30-60__ seconds. Repeat _3__ times. Do __3__ sessions per day. CAUTION: Stretch should be gentle, steady and slow.   Outer Hip Stretch: Reclined IT Band Stretch (Strap)   Strap around opposite foot, pull across only as far as possible with shoulders on mat. Hold for __30-60 seconds. Repeat __3__ times each leg. 2-3 x/day.   Stretching: Hamstring (Sitting)   With right leg straight, tuck other foot near groin. Reach down until stretch is felt in back of thigh. Keep back straight. Hold _30-60___ seconds. Repeat __3__ times per set. Do ____ sets per session. Do _2-3___ sessions per day.  Madelyn Flavors, PT 04/07/18 10:11 AM;

## 2018-04-15 ENCOUNTER — Ambulatory Visit: Payer: 59 | Admitting: Physical Therapy

## 2018-04-18 ENCOUNTER — Other Ambulatory Visit: Payer: Self-pay | Admitting: Cardiovascular Disease

## 2018-04-20 ENCOUNTER — Ambulatory Visit: Payer: 59 | Attending: Family Medicine | Admitting: Physical Therapy

## 2018-04-20 ENCOUNTER — Other Ambulatory Visit: Payer: Self-pay | Admitting: Cardiovascular Disease

## 2018-04-20 ENCOUNTER — Encounter: Payer: Self-pay | Admitting: Physical Therapy

## 2018-04-20 DIAGNOSIS — M25552 Pain in left hip: Secondary | ICD-10-CM | POA: Insufficient documentation

## 2018-04-20 MED ORDER — FUROSEMIDE 20 MG PO TABS: 10.0000 mg | ORAL_TABLET | Freq: Every day | ORAL | 9 refills | Status: DC

## 2018-04-20 NOTE — Therapy (Signed)
[place], [place], [address] Phone: [phone]   Fax:  [phone]  Physical Therapy Treatment  Patient Details  Name: Karen Waller MRN: [id number] Date of Birth: 1955-07-11 Referring Provider (PT): Eunice Blase   Encounter Date: 04/20/2018  PT End of Session - 04/20/18 1513    Visit Number  2    Date for PT Re-Evaluation  06/02/18    PT Start Time  1430    PT Stop Time  1513    PT Time Calculation (min)  43 min    Activity Tolerance  Patient tolerated treatment well    Behavior During Therapy  [facility] for tasks assessed/performed       Past Medical History:  Diagnosis Date  . Cardiomyopathy    EF 30-35% diagnosed 02/2008  . Central obesity   . Gout    left knee  . HTN (hypertension)     Past Surgical History:  Procedure Laterality Date  . KNEE SURGERY    . TONSILLECTOMY      There were no vitals filed for this visit.  Subjective Assessment - 04/20/18 1438    Subjective  Pt reports that she is doing better    Currently in Pain?  No/denies                       [facility] Adult PT Treatment/Exercise - 04/20/18 0001      Exercises   Exercises  Knee/Hip      Knee/Hip Exercises: Aerobic   Elliptical  I5 R3 x 3 min     Nustep  NuStep L4 x6 min       Knee/Hip Exercises: Machines for Strengthening   Cybex Knee Flexion  5lb 2x10     Cybex Leg Press  20lb 2x10     Total Gym Leg Press  20lb 2x10      Knee/Hip Exercises: Standing   Heel Raises  Both;2 sets;10 reps;2 seconds    Other Standing Knee Exercises  Hip abd and ext 2lb 2x10       Knee/Hip Exercises: Seated   Sit to Sand  2 sets;5 reps;without UE support               PT Short Term Goals - 04/20/18 1527      PT SHORT TERM GOAL #1   Title  Ind with initial HEP        PT Long Term Goals - 04/20/18 1527      PT LONG TERM GOAL #1   Title  Patient independent and compliant with hip  flexibility stretches.    Status  Partially Met      PT LONG TERM GOAL #2   Title  Patient to demo 5/5 hip flex and ext strength.    Status  On-going      PT LONG TERM GOAL #3   Title  Patient to be able to perform ADLS without pain in left hip.    Status  Partially Met            Plan - 04/20/18 1514    Clinical Impression Statement  Pt tolerated an initial progression to TE well. No issues reported throughout session, all exercises performed with good strength and ROM. Cues to control eccentric phase on leg press. Informed pt to stat slow in the gym and not jump back in too fast.    Rehab Potential  Excellent  PT Frequency  2x / week    PT Duration  8 weeks    PT Treatment/Interventions  ADLs/Self Care Home Management;Cryotherapy;Electrical Stimulation;Moist Heat;Ultrasound;Therapeutic exercise;Patient/family education;Manual techniques;Dry needling    PT Next Visit Plan  LE flexibility and strengthening; give stretch for left QL       Patient will benefit from skilled therapeutic intervention in order to improve the following deficits and impairments:  Pain, Postural dysfunction, Decreased strength, Impaired flexibility  Visit Diagnosis: Pain in left hip     Problem List Patient Active Problem List   Diagnosis Date Noted  . Pain in left hip 03/24/2018  . Bruising, spontaneous 07/06/2017  . Left Achilles tendinitis 11/08/2016  . Conductive hearing loss, bilateral 08/08/2016  . Elevated lipids 12/16/2012  . HTN (hypertension) 01/05/2011  . CHF (congestive heart failure) ([place]) 10/30/2010  . Edema 10/30/2010  . Gouty arthropathy 03/17/2008  . OVERWEIGHT 03/17/2008  . ESSENTIAL HYPERTENSION, BENIGN 03/17/2008  . Non-ischemic cardiomyopathy ([place]) 03/17/2008  . PALPITATIONS, OCCASIONAL 03/17/2008    Scot Jun, PTA 04/20/2018, 3:27 PM  [place], [place], [address] Phone:  [phone]   Fax:  [phone]  Name: Karen Waller MRN: [id number] Date of Birth: 07-Mar-1955

## 2018-05-04 ENCOUNTER — Ambulatory Visit: Payer: 59 | Admitting: Physical Therapy

## 2018-05-27 DIAGNOSIS — I10 Essential (primary) hypertension: Secondary | ICD-10-CM | POA: Diagnosis not present

## 2018-05-27 DIAGNOSIS — Z0000 Encounter for general adult medical examination without abnormal findings: Secondary | ICD-10-CM | POA: Diagnosis not present

## 2018-05-27 DIAGNOSIS — E782 Mixed hyperlipidemia: Secondary | ICD-10-CM | POA: Diagnosis not present

## 2018-05-27 DIAGNOSIS — R233 Spontaneous ecchymoses: Secondary | ICD-10-CM | POA: Diagnosis not present

## 2018-06-17 ENCOUNTER — Other Ambulatory Visit: Payer: Self-pay | Admitting: Cardiovascular Disease

## 2018-06-19 DIAGNOSIS — I10 Essential (primary) hypertension: Secondary | ICD-10-CM | POA: Diagnosis not present

## 2018-07-01 ENCOUNTER — Telehealth: Payer: Self-pay | Admitting: Cardiovascular Disease

## 2018-07-01 NOTE — Telephone Encounter (Signed)
Patient had order for echo in December, which patient cancel and would schedule at a later date. Will see if patient can get echo scheduled soon. Will forward to scheduling.

## 2018-07-01 NOTE — Telephone Encounter (Signed)
  Patient is calling to schedule an echocardiogram that Dr Johnsie Cancel wanted her to have. I did not see an order for echo.

## 2018-07-07 ENCOUNTER — Telehealth: Payer: Self-pay | Admitting: Cardiovascular Disease

## 2018-07-07 DIAGNOSIS — R002 Palpitations: Secondary | ICD-10-CM

## 2018-07-07 NOTE — Telephone Encounter (Signed)
New message;    Patient calling to see if she need a ECHO if so there is not a order in Epic. Please call patient.

## 2018-07-07 NOTE — Progress Notes (Signed)
HEMATOLOGY/ONCOLOGY CONSULTATION NOTE  Date of Service: 07/08/2018  Patient Care Team: Carol Ada, MD as PCP - General (Family Medicine) Josue Hector, MD as PCP - Cardiology (Cardiology)  CHIEF COMPLAINTS/PURPOSE OF CONSULTATION:  Spontaneous bruising   HISTORY OF PRESENTING ILLNESS:   Karen Waller is a wonderful 63 y.o. female who has been previously followed by my colleague Dr. Grace Isaac for evaluation and management of Spontaneous bruising. The pt reports that she is doing well overall.   The pt reports that she has continued to have spontaneous bruising, without raised lumps. She first noticed this in the last two years and takes Meloxicam and Amlodipine regularly, and aspirin very rarely. She also notes that she does not always bruise in response to bumping into objects.   She denies any GI bleeding, large muscle hematoma, bleeding into her joints. She notes that she did not have heavy periods in the past. She has had knee surgery and a tonsillectomy without bleeding complications.   Most recent lab results (06/17/17) of CBC w/diff and CMP is as follows: all values are WNL except for RDW at 17.0, PLT at 118k.  On review of systems, pt reports bruising easily, and denies fevers, chills, night sweats, abdominal pains, leg swelling, and any other symptoms.   On PMHx the pt reports tonsillectomy and knee surgery. On Family Hx the pt denies blood disorders, clotting disorders.  Interval History:   Karen Waller returns today for management and evaluation of easy bruisability. The patient's last visit with [place] was on 01/07/18. The pt reports that she is doing well overall.  The pt reports that she has continued bruising easily and denies obvious trauma to the areas or knowing where she gets her bruises. She notes that sometimes she has a small raised area in the center of her bruises. She notes that she is not moving heavy things. She is completely off Meloxicam and  denies new medications. She notes that she is eating and drinking well in general and denies taking multivitamins on a regular basis. Denies herbal or OTC supplements. The pt notes that she doesn't take any NSAIDs. Denies taking fish oil.  The pt notes that she had an episode in the interim in which she developed gum bleeding after flossing one night. She notes that her gums bled through the night and into the next day, she presented to her dentist and had her gums packed. Denies alcohol use or liver disease. She notes that this was the second occasion that this has happened, the first occurring a few weeks prior. She denies blood in the stools, blood in the urine, nose bleeds, or other concerns for bleeding. She had a knee surgery in the past without concerns for excessive bleeding.  She notes that she has "had some chest pain with palpitations," and she called her cardiologist and will be having an ECHO next week. She will also be wearing a monitor as she has been having increased frequency of palpitations. She is on a beta blocker which she notes has helped her BP. She notes that her palpitations last all day when they present.   The pt notes that she feels more tired now as compared to 6 months ago. She notes that her "arms and everything just feels tired." She notes that she feels better after being able to walk around, but in general has felt more tired. She denies noticing any new lumps or bumps, new bone pains, or new back pains.  Lab results today (07/08/18) of CBC w/diff and CMP is as follows: all values are WNL except for WBC at 3.0k, RBC at 3.63, HGB at 10.9, HCT at 34.7, RDW at 16.9, PLT at 89k, nRBC at 1.0%, ANC at 1.1k, Glucose at 117, Creatinine at 1.06, AST at 14.  On review of systems, pt reports easily bruising, recent palpitations and chest pain, two episodes of gum bleeds, and denies lumps or bumps, concerns for infections, cough, cold, fevers, chills, night sweats, unexpected weight  loss, new bone pains, new back pains, nose bleeds, blood in the stools, blood in the urine, changes in bowel habits, changes in urination, abdominal pains, and any other symptoms.   MEDICAL HISTORY:  Past Medical History:  Diagnosis Date  . Cardiomyopathy    EF 30-35% diagnosed 02/2008  . Central obesity   . Gout    left knee  . HTN (hypertension)     SURGICAL HISTORY: Past Surgical History:  Procedure Laterality Date  . KNEE SURGERY    . TONSILLECTOMY      SOCIAL HISTORY: Social History   Socioeconomic History  . Marital status: Married    Spouse name: Not on file  . Number of children: 2  . Years of education: Not on file  . Highest education level: Not on file  Occupational History  . Occupation: [employer] officer  . Financial resource strain: Not on file  . Food insecurity:    Worry: Not on file    Inability: Not on file  . Transportation needs:    Medical: Not on file    Non-medical: Not on file  Tobacco Use  . Smoking status: Former Smoker    Last attempt to quit: 02/19/1984    Years since quitting: 34.4  . Smokeless tobacco: Never Used  Substance and Sexual Activity  . Alcohol use: Not Currently  . Drug use: Never  . Sexual activity: Not on file  Lifestyle  . Physical activity:    Days per week: Not on file    Minutes per session: Not on file  . Stress: Not on file  Relationships  . Social connections:    Talks on phone: Not on file    Gets together: Not on file    Attends religious service: Not on file    Active member of club or organization: Not on file    Attends meetings of clubs or organizations: Not on file    Relationship status: Not on file  . Intimate partner violence:    Fear of current or ex partner: Not on file    Emotionally abused: Not on file    Physically abused: Not on file    Forced sexual activity: Not on file  Other Topics Concern  . Not on file  Social History Narrative  . Not on file    FAMILY HISTORY: Family  History  Problem Relation Age of Onset  . Hypertension Mother     ALLERGIES:  is allergic to sulfa antibiotics.  MEDICATIONS:  Current Outpatient Medications  Medication Sig Dispense Refill  . amLODipine (NORVASC) 10 MG tablet TAKE 1 TABLET BY MOUTH DAILY 30 tablet 11  . candesartan (ATACAND) 16 MG tablet TAKE 1 TABLET BY MOUTH DAILY 90 tablet 2  . candesartan (ATACAND) 16 MG tablet TAKE 1 TABLET BY MOUTH DAILY 90 tablet 0  . carvedilol (COREG) 6.25 MG tablet TAKE 1 TABLET BY MOUTH TWICE DAILY WITH MEALS 180 tablet 3  . Cholecalciferol (VITAMIN D3) 2000  units capsule Take 2,000 Units by mouth daily.    . diclofenac sodium (VOLTAREN) 1 % GEL Apply 4 g topically 4 (four) times daily as needed. 500 g 6  . furosemide (LASIX) 20 MG tablet Take 0.5 tablets (10 mg total) by mouth daily. 15 tablet 9  . hydrALAZINE (APRESOLINE) 25 MG tablet TAKE 1 TABLET BY MOUTH TWICE DAILY 180 tablet 3  . meloxicam (MOBIC) 15 MG tablet Take 15 mg by mouth daily as needed for pain.     . meloxicam (MOBIC) 7.5 MG tablet TAKE 1 TABLET BY MOUTH TWICE DAILY AS NEEDED 60 tablet 0  . Multiple Vitamin (MULTIVITAMIN) capsule Take 1 capsule by mouth daily.      . simvastatin (ZOCOR) 20 MG tablet Take 20 mg by mouth daily.   5   No current facility-administered medications for this visit.     REVIEW OF SYSTEMS:    A 10+ POINT REVIEW OF SYSTEMS WAS OBTAINED including neurology, dermatology, psychiatry, cardiac, respiratory, lymph, extremities, GI, GU, Musculoskeletal, constitutional, breasts, reproductive, HEENT.  All pertinent positives are noted in the HPI.  All others are negative.   PHYSICAL EXAMINATION:  Vitals:   07/08/18 1458  BP: 128/76  Pulse: 72  Resp: 17  Temp: 98.2 F (36.8 C)  SpO2: 99%   Filed Weights   07/08/18 1458  Weight: 198 lb 1.6 oz (89.9 kg)   .Body mass index is 31.97 kg/m.  GENERAL:alert, in no acute distress and comfortable SKIN: no acute rashes, no significant lesions EYES:  conjunctiva are pink and non-injected, sclera anicteric OROPHARYNX: MMM, no exudates, no oropharyngeal erythema or ulceration NECK: supple, no JVD LYMPH:  no palpable lymphadenopathy in the cervical, axillary or inguinal regions LUNGS: clear to auscultation b/l with normal respiratory effort HEART: regular rate & rhythm ABDOMEN:  normoactive bowel sounds , non tender, not distended. No palpable hepatosplenomegaly.  Extremity: no pedal edema PSYCH: alert & oriented x 3 with fluent speech NEURO: no focal motor/sensory deficits   LABORATORY DATA:  I have reviewed the data as listed  . CBC Latest Ref Rng & Units 07/08/2018 01/08/2018 06/17/2017  WBC 4.0 - 10.5 K/uL 3.0(L) 3.1(L) 4.0  Hemoglobin 12.0 - 15.0 g/dL 10.9(L) 11.1(L) 11.8  Hematocrit 36.0 - 46.0 % 34.7(L) 35.4(L) 36.0  Platelets 150 - 400 K/uL 89(L) 104(L) 118(L)    . CMP Latest Ref Rng & Units 07/08/2018 01/08/2018 06/17/2017  Glucose 70 - 99 mg/dL 117(H) 117(H) 96  BUN 8 - 23 mg/dL 18 12 13   Creatinine 0.44 - 1.00 mg/dL 1.06(H) 1.10(H) 0.95  Sodium 135 - 145 mmol/L 141 145 143  Potassium 3.5 - 5.1 mmol/L 4.0 4.0 4.0  Chloride 98 - 111 mmol/L 108 111 109  CO2 22 - 32 mmol/L 24 25 26   Calcium 8.9 - 10.3 mg/dL 9.1 9.3 9.7  Total Protein 6.5 - 8.1 g/dL 7.5 7.2 7.3  Total Bilirubin 0.3 - 1.2 mg/dL 0.5 0.5 0.5  Alkaline Phos 38 - 126 U/L 80 85 89  AST 15 - 41 U/L 14(L) 15 18  ALT 0 - 44 U/L 16 13 19    Component     Latest Ref Rng & Units 06/17/2017  Coagulation Factor VIII     57 - 163 % 128  Ristocetin Co-factor, Plasma     50 - 200 % 117  Von Willebrand Antigen, Plasma     50 - 200 % 126  Prothrombin Time     11.4 - 15.2 seconds 12.9  INR  0.98  PFA Interpretation        Collagen / Epinephrine     0 - 193 seconds 192  Fibrinogen     210 - 475 mg/dL 291  APTT     24 - 36 seconds 31  LDH     125 - 245 U/L 230   Component     Latest Ref Rng & Units 01/08/2018  WBC     4.0 - 10.5 K/uL 3.1 (L)  RBC      3.87 - 5.11 MIL/uL 3.71 (L)  Hemoglobin     12.0 - 15.0 g/dL 11.1 (L)  HCT     36.0 - 46.0 % 35.4 (L)  MCV     80.0 - 100.0 fL 95.4  MCH     26.0 - 34.0 pg 29.9  MCHC     30.0 - 36.0 g/dL 31.4  RDW     11.5 - 15.5 % 16.4 (H)  Platelets     150 - 400 K/uL 104 (L)  nRBC     0.0 - 0.2 % 0.0  Neutrophils     % 37  NEUT#     1.7 - 7.7 K/uL 1.2 (L)  Lymphocytes     % 51  Lymphocyte #     0.7 - 4.0 K/uL 1.6  Monocytes Relative     % 12  Monocyte #     0.1 - 1.0 K/uL 0.4  Eosinophil     % 0  Eosinophils Absolute     0.0 - 0.5 K/uL 0.0  Basophil     % 0  Basophils Absolute     0.0 - 0.1 K/uL 0.0  Immature Granulocytes     % 0  Abs Immature Granulocytes     0.00 - 0.07 K/uL 0.00  Sodium     135 - 145 mmol/L 145  Potassium     3.5 - 5.1 mmol/L 4.0  Chloride     98 - 111 mmol/L 111  CO2     22 - 32 mmol/L 25  Glucose     70 - 99 mg/dL 117 (H)  BUN     8 - 23 mg/dL 12  Creatinine     0.44 - 1.00 mg/dL 1.10 (H)  Calcium     8.9 - 10.3 mg/dL 9.3  Total Protein     6.5 - 8.1 g/dL 7.2  Albumin     3.5 - 5.0 g/dL 3.8  AST     15 - 41 U/L 15  ALT     0 - 44 U/L 13  Alkaline Phosphatase     38 - 126 U/L 85  Total Bilirubin     0.3 - 1.2 mg/dL 0.5  GFR, Est Non African American     >60 mL/min 53 (L)  GFR, Est African American     >60 mL/min >60  Anion gap     5 - 15 9  Prothrombin Time     11.4 - 15.2 seconds 12.7  INR      0.96  APTT     24 - 36 seconds 30  Fibrinogen     210 - 475 mg/dL 272    RADIOGRAPHIC STUDIES: I have personally reviewed the radiological images as listed and agreed with the findings in the report. No results found.  ASSESSMENT & PLAN:   63 y.o. female with  1. Minor Spontaneous?/undetected trauma causing bruising No ecchymosis with bruising  Clotting parameters normal from 06/17/17 with  normal PT, aPTT and VWD panel and platelet function testing 01/08/18 PT and aPTT, fibrinogen WNL  2. Thrombocytopenia Pt's PLT in  10/19/14 were normal at 215k when she was having abnormal bruising Labwork from 12/25/17; mild anemia with HGB at 11.5, ANC at 1.2k, PLT at 93k  3. Neutropenia  PLAN -Discussed pt labwork today, 07/08/18; several mild cytopenias with WBC slightly reduced to 3.0k and ANC at 1.1k, HGB slightly reduced to 10.9, and PLT decreased to 89k. Chemistries are stable -Discussed that I do not have a clear explanation for her increased bruising, as her platelet function has been seen to be normal on 06/17/17 PLT function assay and normal fibrinogen. At PLT >50k there is also not explanation of ease of bruising. -Amlodipine as causing increased bruisability as well? -No overt evidence of significant bleeding disorder clinically or based on screening labs -Could have increased levels of fibrinolysis -Could be an underlying qualitative platelet disorder  -Discussed that her slowly down trending cytopenias is more clinically significant and could necessitate further work up -If vitamin deficiencies are ruled out, a BM Bx could be indicated. Not strongly indicated at this time. -Discussed that Meloxicam is known to cause bruising, and her monthly use could be associated with onset of her bruising -Recommend Tylenol for knee pain -Recommend taking 250-500mg  Vitamin C -Moisturize skin regularly, could use Vitamin E oil as well -Minimize NSAIDs -Pt will continue to monitor for skin rashes and raised bruises  -Recommend compression sleeves for venous support -Continue follow up with Cardiology -Continue age appropriate cancer screening with PCP -Will see the pt back in 4 months   RTC with Dr Irene Limbo in 4 months with labs. Plz schedule labs 1 week prior to visit   All of the patients questions were answered with apparent satisfaction. The patient knows to call the clinic with any problems, questions or concerns.  The total time spent in the appt was 30 minutes and more than 50% was on counseling and direct patient  cares.    Sullivan Lone MD MS AAHIVMS [facility] Hematology/Oncology Physician [facility]  (Office):       [phone] (Work cell):  [phone] (Fax):           [phone]  07/08/2018 3:41 PM  I, Baldwin Jamaica, am acting as a scribe for Dr. Sullivan Lone.   ..gkjsc .Brunetta Genera MD

## 2018-07-07 NOTE — Telephone Encounter (Signed)
New message:    Patient would like to know if she can come in sooner to have appt with the doctor.

## 2018-07-07 NOTE — Telephone Encounter (Signed)
Pt called to report that she continues to have palpitations... she denies dizziness, chest pain, sob... she is asking to have the echo she was suppose to have 02/2018 done....... new Echo appt made for 07/16/18 at 10:45am. Will call her after Dr. Johnsie Cancel reviews and when he would like her to follow up... pt agrees.

## 2018-07-08 ENCOUNTER — Telehealth: Payer: Self-pay | Admitting: *Deleted

## 2018-07-08 ENCOUNTER — Inpatient Hospital Stay (HOSPITAL_BASED_OUTPATIENT_CLINIC_OR_DEPARTMENT_OTHER): Payer: 59 | Admitting: Hematology

## 2018-07-08 ENCOUNTER — Inpatient Hospital Stay: Payer: 59 | Attending: Hematology

## 2018-07-08 ENCOUNTER — Telehealth: Payer: Self-pay | Admitting: Hematology

## 2018-07-08 ENCOUNTER — Other Ambulatory Visit: Payer: Self-pay | Admitting: *Deleted

## 2018-07-08 ENCOUNTER — Other Ambulatory Visit: Payer: Self-pay

## 2018-07-08 VITALS — BP 128/76 | HR 72 | Temp 98.2°F | Resp 17 | Ht 66.0 in | Wt 198.1 lb

## 2018-07-08 DIAGNOSIS — R233 Spontaneous ecchymoses: Secondary | ICD-10-CM | POA: Diagnosis present

## 2018-07-08 DIAGNOSIS — I10 Essential (primary) hypertension: Secondary | ICD-10-CM | POA: Diagnosis not present

## 2018-07-08 DIAGNOSIS — Z79899 Other long term (current) drug therapy: Secondary | ICD-10-CM | POA: Insufficient documentation

## 2018-07-08 DIAGNOSIS — D696 Thrombocytopenia, unspecified: Secondary | ICD-10-CM | POA: Insufficient documentation

## 2018-07-08 DIAGNOSIS — R238 Other skin changes: Secondary | ICD-10-CM

## 2018-07-08 DIAGNOSIS — D709 Neutropenia, unspecified: Secondary | ICD-10-CM | POA: Insufficient documentation

## 2018-07-08 DIAGNOSIS — D61818 Other pancytopenia: Secondary | ICD-10-CM

## 2018-07-08 DIAGNOSIS — Z791 Long term (current) use of non-steroidal anti-inflammatories (NSAID): Secondary | ICD-10-CM

## 2018-07-08 DIAGNOSIS — Z87891 Personal history of nicotine dependence: Secondary | ICD-10-CM

## 2018-07-08 LAB — CBC WITH DIFFERENTIAL (CANCER CENTER ONLY)
Abs Immature Granulocytes: 0 10*3/uL (ref 0.00–0.07)
Basophils Absolute: 0 10*3/uL (ref 0.0–0.1)
Basophils Relative: 0 %
Eosinophils Absolute: 0 10*3/uL (ref 0.0–0.5)
Eosinophils Relative: 0 %
HCT: 34.7 % — ABNORMAL LOW (ref 36.0–46.0)
Hemoglobin: 10.9 g/dL — ABNORMAL LOW (ref 12.0–15.0)
Immature Granulocytes: 0 %
Lymphocytes Relative: 53 %
Lymphs Abs: 1.6 10*3/uL (ref 0.7–4.0)
MCH: 30 pg (ref 26.0–34.0)
MCHC: 31.4 g/dL (ref 30.0–36.0)
MCV: 95.6 fL (ref 80.0–100.0)
Monocytes Absolute: 0.4 10*3/uL (ref 0.1–1.0)
Monocytes Relative: 12 %
Neutro Abs: 1.1 10*3/uL — ABNORMAL LOW (ref 1.7–7.7)
Neutrophils Relative %: 35 %
Platelet Count: 89 10*3/uL — ABNORMAL LOW (ref 150–400)
RBC: 3.63 MIL/uL — ABNORMAL LOW (ref 3.87–5.11)
RDW: 16.9 % — ABNORMAL HIGH (ref 11.5–15.5)
WBC Count: 3 10*3/uL — ABNORMAL LOW (ref 4.0–10.5)
nRBC: 1 % — ABNORMAL HIGH (ref 0.0–0.2)

## 2018-07-08 LAB — CMP (CANCER CENTER ONLY)
ALT: 16 U/L (ref 0–44)
AST: 14 U/L — ABNORMAL LOW (ref 15–41)
Albumin: 4 g/dL (ref 3.5–5.0)
Alkaline Phosphatase: 80 U/L (ref 38–126)
Anion gap: 9 (ref 5–15)
BUN: 18 mg/dL (ref 8–23)
CO2: 24 mmol/L (ref 22–32)
Calcium: 9.1 mg/dL (ref 8.9–10.3)
Chloride: 108 mmol/L (ref 98–111)
Creatinine: 1.06 mg/dL — ABNORMAL HIGH (ref 0.44–1.00)
GFR, Est AFR Am: >60 mL/min (ref >60)
GFR, Estimated: 56 mL/min — ABNORMAL LOW (ref >60)
Glucose, Bld: 117 mg/dL — ABNORMAL HIGH (ref 70–99)
Potassium: 4 mmol/L (ref 3.5–5.1)
Sodium: 141 mmol/L (ref 135–145)
Total Bilirubin: 0.5 mg/dL (ref 0.3–1.2)
Total Protein: 7.5 g/dL (ref 6.5–8.1)

## 2018-07-08 NOTE — Telephone Encounter (Signed)
Can do echo and Zio patch 2 weeks and see me next DOD day

## 2018-07-08 NOTE — Telephone Encounter (Signed)
Called patient with Dr. Kyla Balzarine recommendations. Patient has echo later this month. Will send message to monitor techs to help get patient set up for monitor. Scheduled patient with Dr. Johnsie Cancel on DOD day 09/02/18. Patient agreed to plan.

## 2018-07-08 NOTE — Telephone Encounter (Signed)
Patient is suppose to follow up in December. Patient would like a sooner appt. Will forward to Dr. Johnsie Cancel for advisement.

## 2018-07-08 NOTE — Telephone Encounter (Signed)
Scheduled appt per 5/20 los. A calendar will be mailed out. °

## 2018-07-08 NOTE — Telephone Encounter (Signed)
See other telephone dated 5/20 Shelly W. Mailed monitor

## 2018-07-08 NOTE — Telephone Encounter (Signed)
[employer] to ship a 14 day long term holter monitor to the patients home.  Instructions reviewed briefly as the are included in the monitor kit.  Shipping address confirmed. 

## 2018-07-15 ENCOUNTER — Telehealth (HOSPITAL_COMMUNITY): Payer: Self-pay | Admitting: Cardiology

## 2018-07-15 NOTE — Telephone Encounter (Signed)
Called to give echo appointment instructions. 

## 2018-07-16 ENCOUNTER — Ambulatory Visit (INDEPENDENT_AMBULATORY_CARE_PROVIDER_SITE_OTHER): Payer: 59

## 2018-07-16 ENCOUNTER — Telehealth (HOSPITAL_COMMUNITY): Payer: Self-pay

## 2018-07-16 ENCOUNTER — Ambulatory Visit (HOSPITAL_COMMUNITY): Payer: 59 | Attending: Cardiovascular Disease

## 2018-07-16 ENCOUNTER — Other Ambulatory Visit (HOSPITAL_COMMUNITY): Payer: Self-pay | Admitting: Cardiovascular Disease

## 2018-07-16 ENCOUNTER — Other Ambulatory Visit: Payer: Self-pay

## 2018-07-16 DIAGNOSIS — R002 Palpitations: Secondary | ICD-10-CM

## 2018-07-16 NOTE — Telephone Encounter (Signed)

## 2018-07-20 IMAGING — MG DIGITAL SCREENING BILATERAL MAMMOGRAM WITH TOMO AND CAD
8 series · 8 of 24 positions shown · non-contrast
Comparison: Previous exam(s).

CLINICAL DATA: Screening.

EXAM:
DIGITAL SCREENING BILATERAL MAMMOGRAM WITH TOMO AND CAD

[L CC synth-2D]
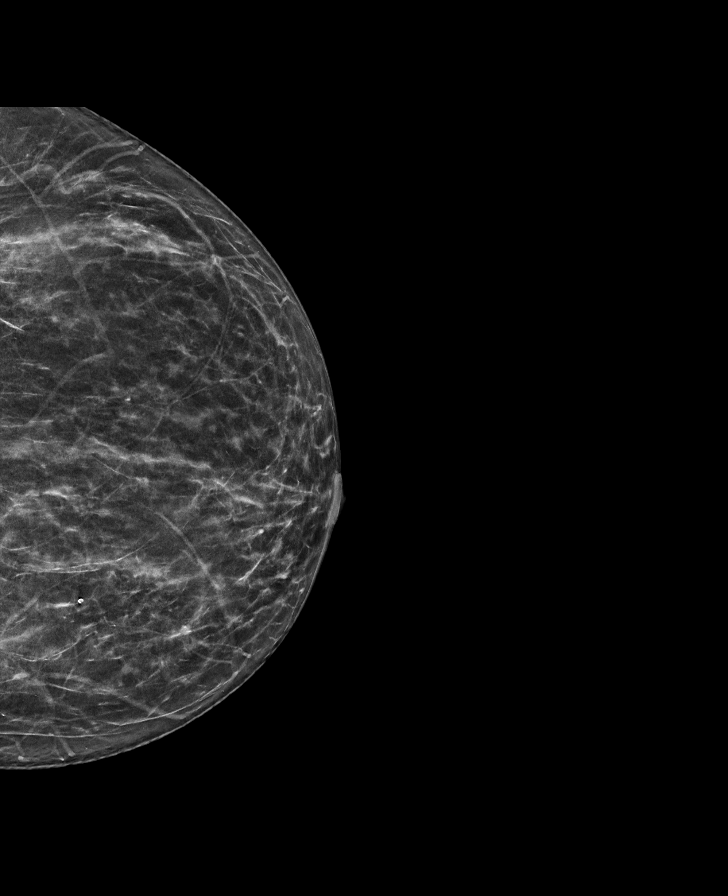

[R MLO synth-2D]
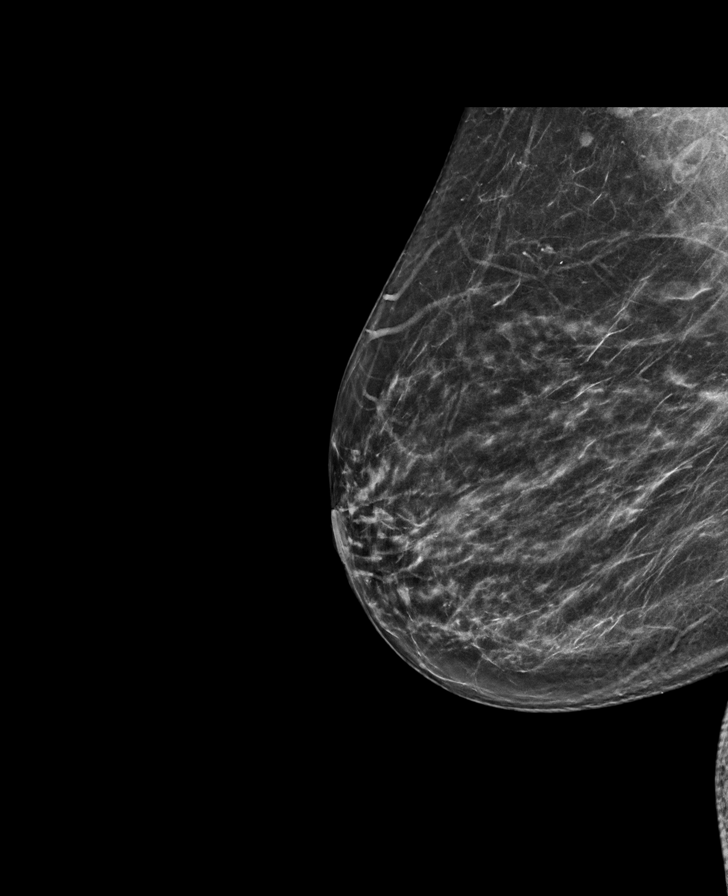

[R CC synth-2D]
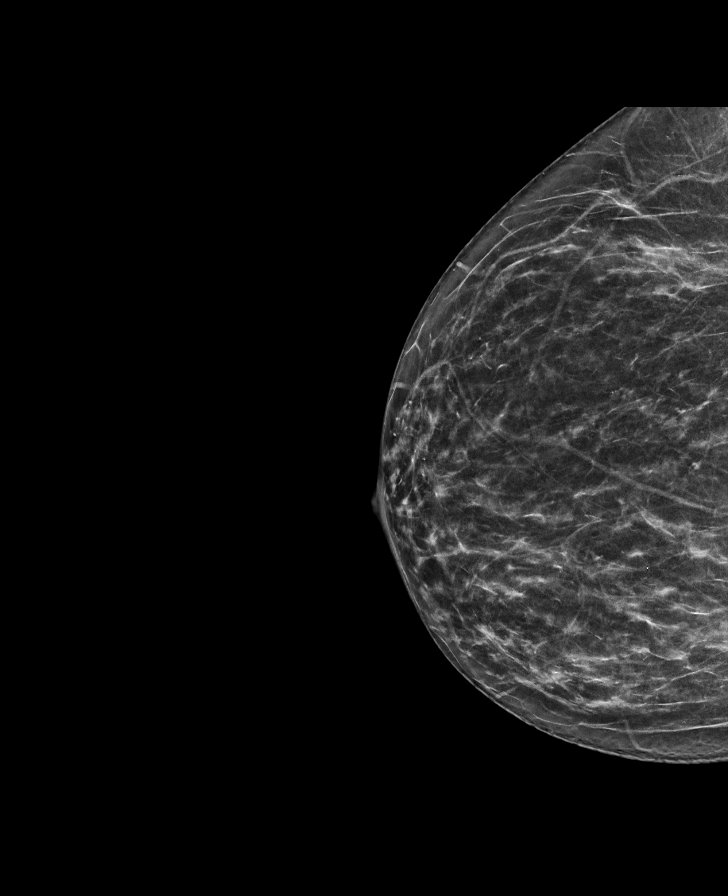

[L MLO synth-2D]
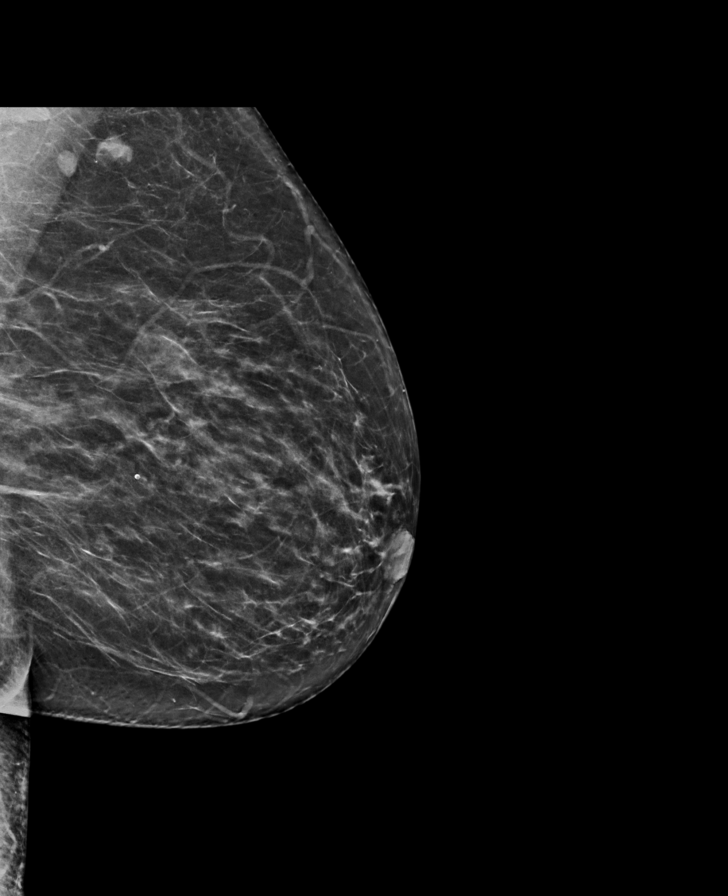

[L CC tomo · tomo slice 31/61.0]
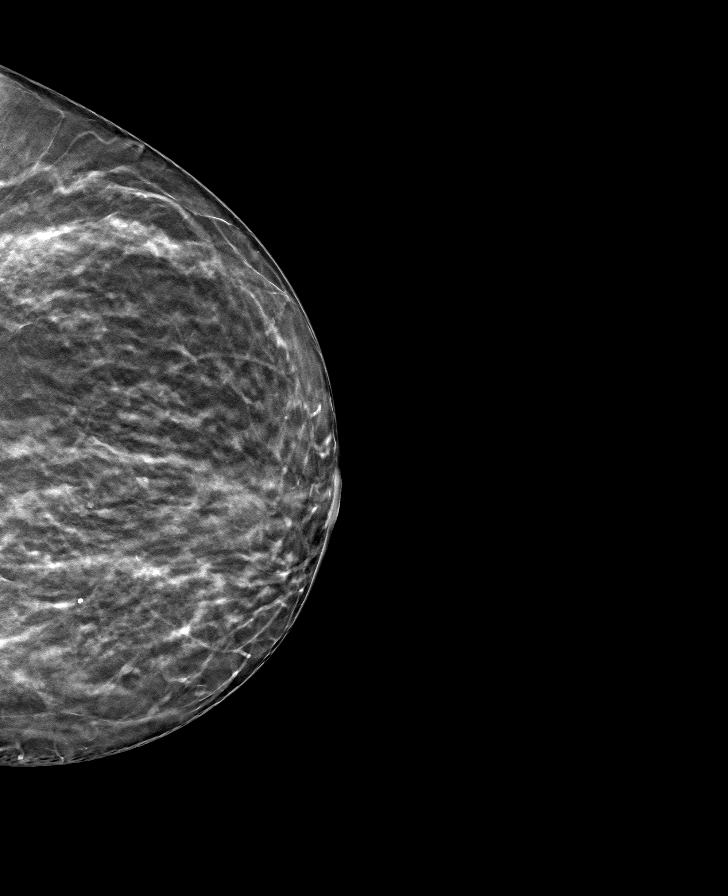

[R MLO tomo · tomo slice 37/72.0]
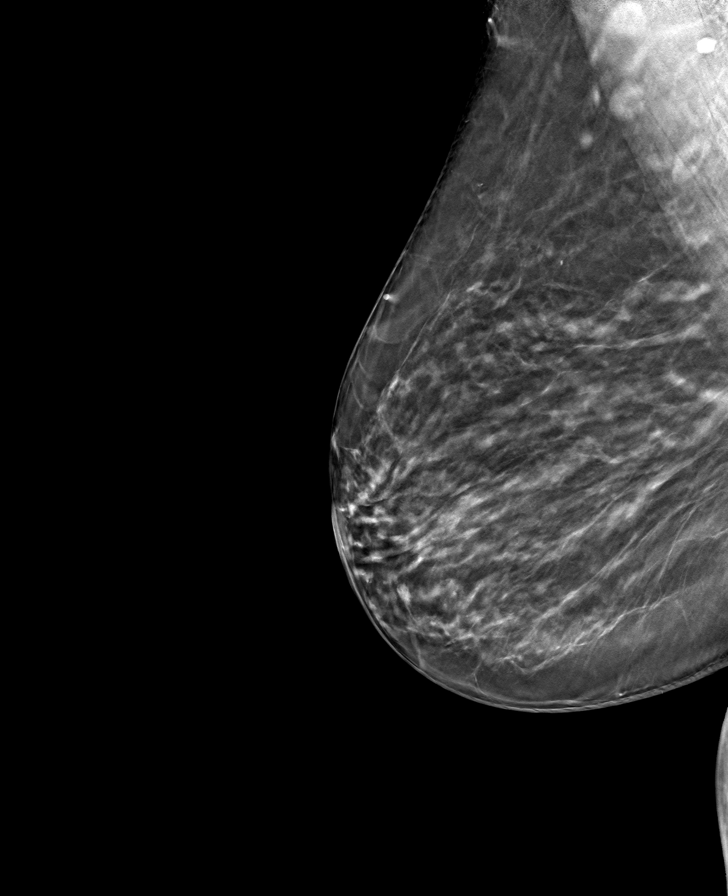

[L MLO tomo · tomo slice 39/76.0]
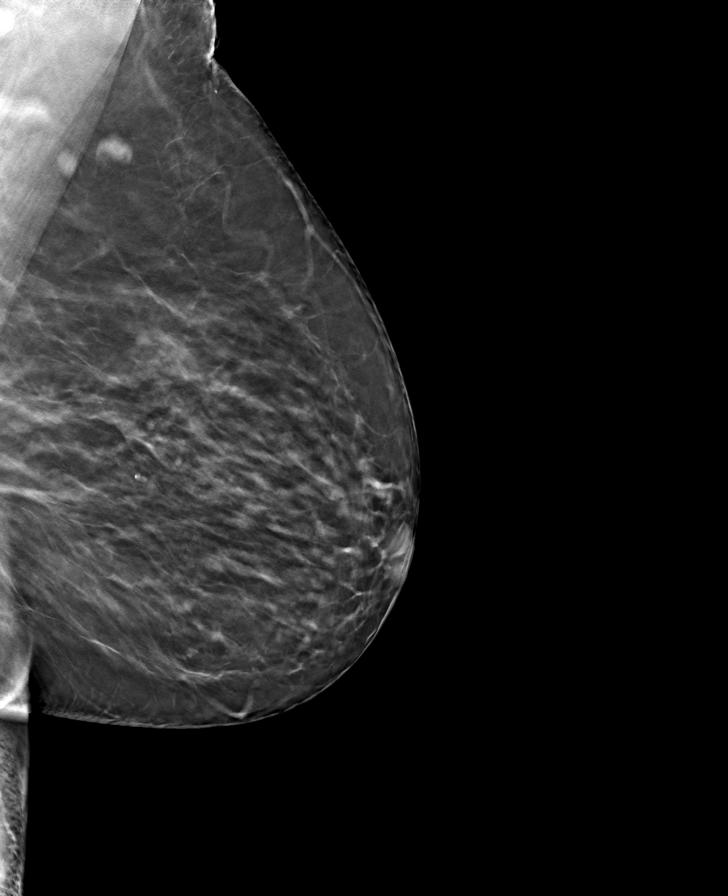

[R CC tomo · tomo slice 32/63.0]
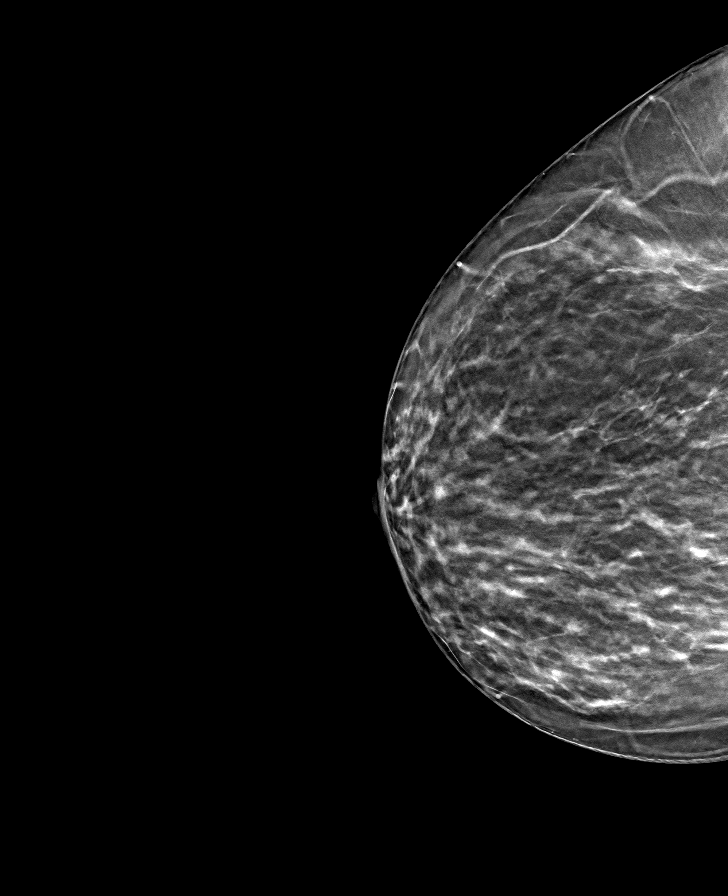

[8 of 24 positions shown; findings below may reference images not displayed]

ACR Breast Density Category b: There are scattered areas of
fibroglandular density.
FINDINGS: There are no findings suspicious for malignancy. Images were
processed with CAD.
IMPRESSION: No mammographic evidence of malignancy. A result letter of this
screening mammogram will be mailed directly to the patient.

RECOMMENDATION:
Screening mammogram in one year. (Code:[id number])

BI-RADS CATEGORY  1: Negative.

## 2018-08-11 ENCOUNTER — Other Ambulatory Visit: Payer: Self-pay

## 2018-08-28 ENCOUNTER — Other Ambulatory Visit: Payer: Self-pay | Admitting: Family Medicine

## 2018-08-28 DIAGNOSIS — Z1231 Encounter for screening mammogram for malignant neoplasm of breast: Secondary | ICD-10-CM

## 2018-08-30 NOTE — Progress Notes (Signed)
Patient ID: Karen Waller, female   DOB: 27-Nov-1955, 63 y.o.   MRN: [id number]     Karen Waller is seen today for DCM, HTN palpitations and elevated lipids.. She denies syncope, SSCP, dyspnea or edema.. Her EF had been as low as 35%. She is tolerating BB and ARB with no palpitations. Work at [employer] is [employer] well. She is seeing a dietician for initial Rx of her elevated lipids. Compliant with meds  Diuretics can cause flairs in gout especially HCTZ Atacand worked better than Cozaar.   EF 47% by MRI 01/13/13.  No scar.    TTE reviewed 07/16/18 EF 55-60% normal valves GLS -16.6  Monitor June 2020 no PAF some atrial and ventricular ectopy but less than 1% beats and did not correlate With symptoms   She is watching her salt but still overweight and sedentary. Walks with her [employer] a few times/week    Some fatigue Some bruising has seen a hematologist for this   Lab Results  Component Value Date   NA 141 07/08/2018   K 4.0 07/08/2018   CL 108 07/08/2018   CO2 24 07/08/2018    ROS: Denies fever, malais, weight loss, blurry vision, decreased visual acuity, cough, sputum, SOB, hemoptysis, pleuritic pain, palpitaitons, heartburn, abdominal pain, melena, lower extremity edema, claudication, or rash.  All other systems reviewed and negative  General: There were no vitals taken for this visit. Affect appropriate Healthy:  appears stated age 47: normal Neck supple with no adenopathy JVP normal no bruits no thyromegaly Lungs clear with no wheezing and good diaphragmatic motion Heart:  S1/S2 no murmur, no rub, gallop or click PMI normal Abdomen: benighn, BS positve, no tenderness, no AAA no bruit.  No HSM or HJR Distal pulses intact with no bruits No edema Neuro non-focal Skin warm and dry No muscular weakness     Current Outpatient Medications  Medication Sig Dispense Refill  . amLODipine (NORVASC) 10 MG tablet TAKE 1 TABLET BY MOUTH DAILY 30 tablet 11  . candesartan  (ATACAND) 16 MG tablet TAKE 1 TABLET BY MOUTH DAILY 90 tablet 2  . candesartan (ATACAND) 16 MG tablet TAKE 1 TABLET BY MOUTH DAILY 90 tablet 0  . carvedilol (COREG) 6.25 MG tablet TAKE 1 TABLET BY MOUTH TWICE DAILY WITH MEALS 180 tablet 3  . Cholecalciferol (VITAMIN D3) 2000 units capsule Take 2,000 Units by mouth daily.    . diclofenac sodium (VOLTAREN) 1 % GEL Apply 4 g topically 4 (four) times daily as needed. 500 g 6  . furosemide (LASIX) 20 MG tablet Take 0.5 tablets (10 mg total) by mouth daily. 15 tablet 9  . hydrALAZINE (APRESOLINE) 25 MG tablet TAKE 1 TABLET BY MOUTH TWICE DAILY 180 tablet 3  . meloxicam (MOBIC) 15 MG tablet Take 15 mg by mouth daily as needed for pain.     . meloxicam (MOBIC) 7.5 MG tablet TAKE 1 TABLET BY MOUTH TWICE DAILY AS NEEDED 60 tablet 0  . Multiple Vitamin (MULTIVITAMIN) capsule Take 1 capsule by mouth daily.      . simvastatin (ZOCOR) 20 MG tablet Take 20 mg by mouth daily.   5   No current facility-administered medications for this visit.     Allergies  Sulfa antibiotics  Electrocardiogram:  10/14  SR rate 62 RAD nonspecific ST/T wave changes  01/25/14 SR rate 70  With sinus arrhythmia no significant change  01/25/15  SR ratre 62  Nonspecific ST changes 01/08/17 SR rate 65 normal  Assessment and Plan  HTN: Well controlled.  Continue current medications and low sodium Dash type diet.    Palpitations:  Continue beta blocker no serious arrhythmia's on monitor June 2020   DCM:  Euvolemic continue current meds EF normal by TTE 07/16/18 no need for Entresto  Bruising:  F/u hematology she indicates PLT;s have been low  Heme indicates normal function   Jenkins Rouge

## 2018-09-01 ENCOUNTER — Telehealth: Payer: Self-pay | Admitting: Cardiovascular Disease

## 2018-09-01 NOTE — Telephone Encounter (Signed)
New Message ° ° ° °Left message to confirm appt and answer covid questions  °

## 2018-09-02 ENCOUNTER — Other Ambulatory Visit: Payer: Self-pay

## 2018-09-02 ENCOUNTER — Ambulatory Visit (INDEPENDENT_AMBULATORY_CARE_PROVIDER_SITE_OTHER): Payer: 59 | Admitting: Cardiovascular Disease

## 2018-09-02 ENCOUNTER — Encounter: Payer: Self-pay | Admitting: Cardiovascular Disease

## 2018-09-02 VITALS — BP 138/76 | HR 81 | Ht 66.0 in | Wt 198.2 lb

## 2018-09-02 DIAGNOSIS — I420 Dilated cardiomyopathy: Secondary | ICD-10-CM

## 2018-09-02 NOTE — Patient Instructions (Signed)
Medication Instructions:  No changes If you need a refill on your cardiac medications before your next appointment, please call your pharmacy.   Lab work: No changes If you have labs (blood work) drawn today and your tests are completely normal, you will receive your results only by: Marland Kitchen MyChart Message (if you have MyChart) OR . A paper copy in the mail If you have any lab test that is abnormal or we need to change your treatment, we will call you to review the results.  Testing/Procedures: None  Follow-Up: At [facility], you and your health needs are our priority.  As part of our continuing mission to provide you with exceptional heart care, we have created designated Provider Care Teams.  These Care Teams include your primary Cardiologist (physician) and Advanced Practice Providers (APPs -  Physician Assistants and Nurse Practitioners) who all work together to provide you with the care you need, when you need it. You will need a follow up appointment in 6 months.  Please call our office 2 months in advance to schedule this appointment.  You may see Jenkins Rouge, MD  or one of the following Advanced Practice Providers on your designated Care Team:   Truitt Merle, NP Cecilie Kicks, NP . Kathyrn Drown, NP  Any Other Special Instructions Will Be Listed Below (If Applicable).

## 2018-10-14 ENCOUNTER — Other Ambulatory Visit: Payer: Self-pay

## 2018-10-14 ENCOUNTER — Ambulatory Visit
Admission: RE | Admit: 2018-10-14 | Discharge: 2018-10-14 | Disposition: A | Discharge status: Home / Self Care | Payer: 59 | Source: Ambulatory Visit | Attending: Family Medicine | Admitting: Family Medicine

## 2018-10-14 DIAGNOSIS — Z1231 Encounter for screening mammogram for malignant neoplasm of breast: Secondary | ICD-10-CM

## 2018-10-28 ENCOUNTER — Other Ambulatory Visit: Payer: Self-pay | Admitting: Family Medicine

## 2018-10-28 DIAGNOSIS — R1030 Lower abdominal pain, unspecified: Secondary | ICD-10-CM

## 2018-11-02 ENCOUNTER — Other Ambulatory Visit: Payer: Self-pay

## 2018-11-02 ENCOUNTER — Inpatient Hospital Stay: Payer: 59 | Attending: Hematology

## 2018-11-02 ENCOUNTER — Ambulatory Visit
Admission: RE | Admit: 2018-11-02 | Discharge: 2018-11-02 | Disposition: A | Discharge status: Home / Self Care | Payer: 59 | Source: Ambulatory Visit | Attending: Family Medicine | Admitting: Family Medicine

## 2018-11-02 DIAGNOSIS — Z87891 Personal history of nicotine dependence: Secondary | ICD-10-CM | POA: Insufficient documentation

## 2018-11-02 DIAGNOSIS — R233 Spontaneous ecchymoses: Secondary | ICD-10-CM | POA: Diagnosis not present

## 2018-11-02 DIAGNOSIS — I10 Essential (primary) hypertension: Secondary | ICD-10-CM | POA: Insufficient documentation

## 2018-11-02 DIAGNOSIS — R42 Dizziness and giddiness: Secondary | ICD-10-CM | POA: Diagnosis not present

## 2018-11-02 DIAGNOSIS — R5383 Other fatigue: Secondary | ICD-10-CM | POA: Diagnosis not present

## 2018-11-02 DIAGNOSIS — D61818 Other pancytopenia: Secondary | ICD-10-CM

## 2018-11-02 DIAGNOSIS — Z79899 Other long term (current) drug therapy: Secondary | ICD-10-CM | POA: Insufficient documentation

## 2018-11-02 DIAGNOSIS — Z791 Long term (current) use of non-steroidal anti-inflammatories (NSAID): Secondary | ICD-10-CM | POA: Insufficient documentation

## 2018-11-02 DIAGNOSIS — R1030 Lower abdominal pain, unspecified: Secondary | ICD-10-CM

## 2018-11-02 DIAGNOSIS — M25559 Pain in unspecified hip: Secondary | ICD-10-CM | POA: Insufficient documentation

## 2018-11-02 LAB — CMP (CANCER CENTER ONLY)
ALT: 12 U/L (ref 0–44)
AST: 13 U/L — ABNORMAL LOW (ref 15–41)
Albumin: 4 g/dL (ref 3.5–5.0)
Alkaline Phosphatase: 79 U/L (ref 38–126)
Anion gap: 9 (ref 5–15)
BUN: 20 mg/dL (ref 8–23)
CO2: 24 mmol/L (ref 22–32)
Calcium: 9.1 mg/dL (ref 8.9–10.3)
Chloride: 109 mmol/L (ref 98–111)
Creatinine: 1.13 mg/dL — ABNORMAL HIGH (ref 0.44–1.00)
GFR, Est AFR Am: >60 mL/min (ref >60)
GFR, Estimated: 52 mL/min — ABNORMAL LOW (ref >60)
Glucose, Bld: 147 mg/dL — ABNORMAL HIGH (ref 70–99)
Potassium: 3.8 mmol/L (ref 3.5–5.1)
Sodium: 142 mmol/L (ref 135–145)
Total Bilirubin: 0.4 mg/dL (ref 0.3–1.2)
Total Protein: 7.2 g/dL (ref 6.5–8.1)

## 2018-11-02 LAB — CBC WITH DIFFERENTIAL/PLATELET
Abs Immature Granulocytes: 0 10*3/uL (ref 0.00–0.07)
Basophils Absolute: 0 10*3/uL (ref 0.0–0.1)
Basophils Relative: 0 %
Eosinophils Absolute: 0 10*3/uL (ref 0.0–0.5)
Eosinophils Relative: 0 %
HCT: 32.8 % — ABNORMAL LOW (ref 36.0–46.0)
Hemoglobin: 10.4 g/dL — ABNORMAL LOW (ref 12.0–15.0)
Immature Granulocytes: 0 %
Lymphocytes Relative: 53 %
Lymphs Abs: 1.6 10*3/uL (ref 0.7–4.0)
MCH: 30.3 pg (ref 26.0–34.0)
MCHC: 31.7 g/dL (ref 30.0–36.0)
MCV: 95.6 fL (ref 80.0–100.0)
Monocytes Absolute: 0.4 10*3/uL (ref 0.1–1.0)
Monocytes Relative: 12 %
Neutro Abs: 1.1 10*3/uL — ABNORMAL LOW (ref 1.7–7.7)
Neutrophils Relative %: 35 %
Platelets: 87 10*3/uL — ABNORMAL LOW (ref 150–400)
RBC: 3.43 MIL/uL — ABNORMAL LOW (ref 3.87–5.11)
RDW: 17.3 % — ABNORMAL HIGH (ref 11.5–15.5)
WBC: 3.1 10*3/uL — ABNORMAL LOW (ref 4.0–10.5)
nRBC: 0.7 % — ABNORMAL HIGH (ref 0.0–0.2)

## 2018-11-02 LAB — LACTATE DEHYDROGENASE: LDH: 202 U/L — ABNORMAL HIGH (ref 98–192)

## 2018-11-02 LAB — VITAMIN B12: Vitamin B-12: 3035 pg/mL — ABNORMAL HIGH (ref 180–914)

## 2018-11-02 LAB — FERRITIN: Ferritin: 78 ng/mL (ref 11–307)

## 2018-11-02 LAB — SEDIMENTATION RATE: Sed Rate: 26 mm/hr — ABNORMAL HIGH (ref 0–22)

## 2018-11-03 LAB — MULTIPLE MYELOMA PANEL, SERUM
Albumin SerPl Elph-Mcnc: 3.7 g/dL (ref 2.9–4.4)
Albumin/Glob SerPl: 1.4 (ref 0.7–1.7)
Alpha 1: 0.2 g/dL (ref 0.0–0.4)
Alpha2 Glob SerPl Elph-Mcnc: 0.5 g/dL (ref 0.4–1.0)
B-Globulin SerPl Elph-Mcnc: 0.9 g/dL (ref 0.7–1.3)
Gamma Glob SerPl Elph-Mcnc: 1.1 g/dL (ref 0.4–1.8)
Globulin, Total: 2.8 g/dL (ref 2.2–3.9)
IgA: 369 mg/dL — ABNORMAL HIGH (ref 87–352)
IgG (Immunoglobin G), Serum: 1073 mg/dL (ref 586–1602)
IgM (Immunoglobulin M), Srm: 226 mg/dL — ABNORMAL HIGH (ref 26–217)
M Protein SerPl Elph-Mcnc: 0.2 g/dL — ABNORMAL HIGH
Total Protein ELP: 6.5 g/dL (ref 6.0–8.5)

## 2018-11-03 LAB — FOLATE RBC
Folate, Hemolysate: 294 ng/mL
Folate, RBC: 916 ng/mL (ref >498)
Hematocrit: 32.1 % — ABNORMAL LOW (ref 34.0–46.6)

## 2018-11-03 LAB — HAPTOGLOBIN: Haptoglobin: <10 mg/dL — ABNORMAL LOW (ref 37–355)

## 2018-11-08 NOTE — Progress Notes (Signed)
HEMATOLOGY/ONCOLOGY CONSULTATION NOTE  Date of Service: 11/09/2018  Patient Care Team: Carol Ada, MD as PCP - General (Family Medicine) Josue Hector, MD as PCP - Cardiology (Cardiology)  CHIEF COMPLAINTS/PURPOSE OF CONSULTATION:  Spontaneous bruising   HISTORY OF PRESENTING ILLNESS:   Karen Waller is a wonderful 63 y.o. female who has been previously followed by my colleague Dr. Grace Isaac for evaluation and management of Spontaneous bruising. The pt reports that she is doing well overall.   The pt reports that she has continued to have spontaneous bruising, without raised lumps. She first noticed this in the last two years and takes Meloxicam and Amlodipine regularly, and aspirin very rarely. She also notes that she does not always bruise in response to bumping into objects.   She denies any GI bleeding, large muscle hematoma, bleeding into her joints. She notes that she did not have heavy periods in the past. She has had knee surgery and a tonsillectomy without bleeding complications.   Most recent lab results (06/17/17) of CBC w/diff and CMP is as follows: all values are WNL except for RDW at 17.0, PLT at 118k.  On review of systems, pt reports bruising easily, and denies fevers, chills, night sweats, abdominal pains, leg swelling, and any other symptoms.   On PMHx the pt reports tonsillectomy and knee surgery. On Family Hx the pt denies blood disorders, clotting disorders.  Interval History:   Karen Waller returns today for management and evaluation of easy bruisability. The patient's last visit with [place] was on 07/08/2018. The pt reports that she is doing well overall.  The pt reports that she only took Meloxicam for 1 day and her bruising has been the same in the interim. Pt has never taken a Vitamin B12 replacement. She does take a gummy multivitamin about twice a week. Pt has been feeling more fatigue within the last few months and new hip pain. Pt had a  transvaginal US which showed fibroids that are not connected her hip pain. She has been told that her hip pain is muscular in nature. Pt reports that Tylenol does not work for her pain but she has a new topical ointment that is supposed to give pain relief. She reports that when she holds her head down for awhile and then looks up she feels light headed, this happens about twice a week. Pt is on more than one medication for HTN. She is not taking any medications OTC or any herbal medications. She has a new prescription for Vitamin D. Pt denies having a CT C/A/P scan within the last couple of months. She does not drink.   Lab results (11/02/18) of CBC w/diff and CMP is as follows: all values are WNL except for WBC at 3.1K, RBC at 3.43, Hgb at 10.4, HCT at 32.8, RDW at 17.3, PLTs at 87K, nRBC at 0.7, Neutro Abs at 1.1K, Glucose at 147, Creatinine at 1.13, AST at 13. 11/02/2018 Ferritin at 78 11/02/2018 Vitamin B12 at 3035 11/02/2018 Haptoglobin at <10 11/02/2018 Sed Rate at 26 11/02/2018 LDH at 202 11/02/2018 Folate RBC is as follows: Folate Hemolysate at 294.0, HCT at 32.1, Folate RBC at 916. 11/02/2018 MMP is as follows: IgG at 1073, IgA at 369, IgM at 226, Total Protein ELP at 6.5, Albumin at 3.7, Alpha 1 at 0.2, Alpha2 at 0.5, B-Globulin at 0.9, Gamma Glob at 1.1, M Protein at 0.2, Total Globulin at 2.8, Albumin/Glob at 2.8, IFE shows "Immunofixation shows IgG monoclonal protein  with kappa light chain specificity."   On review of systems, pt reports hip pain, fatigue, light headedness, bruising and denies abdominal pain and any other symptoms.     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 strain: Not on file   Food insecurity    Worry: Not on file    Inability: Not on file   Transportation needs    Medical: Not on file    Non-medical: Not on file  Tobacco Use   Smoking status: Former Smoker    Quit date: 02/19/1984    Years since quitting: 34.7   Smokeless tobacco: Never Used  Substance and Sexual Activity   Alcohol use: Not Currently   Drug use: Never   Sexual activity: Not on file  Lifestyle   Physical activity    Days per week: Not on file    Minutes per session: Not on file   Stress: Not on file  Relationships   Social connections    Talks on phone: Not on file    Gets together: Not on file    Attends religious service: Not on file    Active member of club or organization: Not on file    Attends meetings of clubs or organizations: Not on file    Relationship status: Not on file   Intimate partner violence    Fear of current or ex partner: Not on file    Emotionally abused: Not on file    Physically abused: Not on file    Forced sexual activity: Not on file  Other Topics Concern   Not on file  Social History Narrative   Not on file    FAMILY HISTORY: Family History  Problem Relation Age of Onset   Hypertension Mother    Breast cancer Neg Hx     ALLERGIES:  is allergic to sulfa antibiotics and codeine.  MEDICATIONS:  Current Outpatient Medications  Medication Sig Dispense Refill   amLODipine (NORVASC) 10 MG tablet TAKE 1 TABLET BY MOUTH DAILY 30 tablet 11   candesartan (ATACAND) 16 MG tablet TAKE 1 TABLET BY MOUTH DAILY 90 tablet 2   carvedilol (COREG) 6.25 MG tablet TAKE 1 TABLET BY MOUTH TWICE DAILY WITH MEALS 180 tablet 3   Cholecalciferol (VITAMIN D3) 2000 units capsule Take 2,000  Units by mouth daily.     diclofenac sodium (VOLTAREN) 1 % GEL Apply 4 g topically 4 (four) times daily as needed. 500 g 6   furosemide (LASIX) 20 MG tablet Take 0.5 tablets (10 mg total) by mouth daily. 15 tablet 9   hydrALAZINE (APRESOLINE) 25 MG tablet TAKE 1 TABLET BY MOUTH TWICE DAILY 180 tablet 3   meloxicam (MOBIC) 7.5 MG tablet TAKE 1 TABLET BY MOUTH TWICE DAILY AS NEEDED 60 tablet 0   Multiple Vitamin (MULTIVITAMIN) capsule Take 1 capsule  by mouth daily.       No current facility-administered medications for this visit.     REVIEW OF SYSTEMS:    A 10+ POINT REVIEW OF SYSTEMS WAS OBTAINED including neurology, dermatology, psychiatry, cardiac, respiratory, lymph, extremities, GI, GU, Musculoskeletal, constitutional, breasts, reproductive, HEENT.  All pertinent positives are noted in the HPI.  All others are negative.   PHYSICAL EXAMINATION:  Vitals:   11/09/18 1456  BP: 135/72  Pulse: 72  Resp: 17  Temp: 97.8 F (36.6 C)  SpO2: 100%   Filed Weights   11/09/18 1456  Weight: 200 lb (90.7 kg)   .Body mass index is 32.28 kg/m.  Exam was given in a chair   GENERAL:alert, in no acute distress and comfortable SKIN: no acute rashes, no significant lesions EYES: conjunctiva are pink and non-injected, sclera anicteric OROPHARYNX: MMM, no exudates, no oropharyngeal erythema or ulceration NECK: supple, no JVD LYMPH:  no palpable lymphadenopathy in the cervical, axillary or inguinal regions LUNGS: clear to auscultation b/l with normal respiratory effort HEART: regular rate & rhythm ABDOMEN:  normoactive bowel sounds , non tender, not distended. No palpable hepatosplenomegaly.  Extremity: no pedal edema PSYCH: alert & oriented x 3 with fluent speech NEURO: no focal motor/sensory deficits  LABORATORY DATA:  I have reviewed the data as listed  . CBC Latest Ref Rng & Units 11/02/2018 11/02/2018 07/08/2018  WBC 4.0 - 10.5 K/uL 3.1(L) - 3.0(L)  Hemoglobin 12.0 - 15.0 g/dL  10.4(L) - 10.9(L)  Hematocrit 34.0 - 46.6 % 32.1(L) 32.8(L) 34.7(L)  Platelets 150 - 400 K/uL 87(L) - 89(L)    . CMP Latest Ref Rng & Units 11/02/2018 07/08/2018 01/08/2018  Glucose 70 - 99 mg/dL 147(H) 117(H) 117(H)  BUN 8 - 23 mg/dL 20 18 12   Creatinine 0.44 - 1.00 mg/dL 1.13(H) 1.06(H) 1.10(H)  Sodium 135 - 145 mmol/L 142 141 145  Potassium 3.5 - 5.1 mmol/L 3.8 4.0 4.0  Chloride 98 - 111 mmol/L 109 108 111  CO2 22 - 32 mmol/L 24 24 25   Calcium 8.9 - 10.3 mg/dL 9.1 9.1 9.3  Total Protein 6.5 - 8.1 g/dL 7.2 7.5 7.2  Total Bilirubin 0.3 - 1.2 mg/dL 0.4 0.5 0.5  Alkaline Phos 38 - 126 U/L 79 80 85  AST 15 - 41 U/L 13(L) 14(L) 15  ALT 0 - 44 U/L 12 16 13    Component     Latest Ref Rng & Units 06/17/2017  Coagulation Factor VIII     57 - 163 % 128  Ristocetin Co-factor, Plasma     50 - 200 % 117  Von Willebrand Antigen, Plasma     50 - 200 % 126  Prothrombin Time     11.4 - 15.2 seconds 12.9  INR      0.98  PFA Interpretation        Collagen / Epinephrine     0 - 193 seconds 192  Fibrinogen     210 - 475 mg/dL 291  APTT     24 - 36 seconds 31  LDH     125 - 245 U/L 230   Component     Latest Ref Rng & Units 01/08/2018  WBC     4.0 - 10.5 K/uL 3.1 (L)  RBC     3.87 - 5.11 MIL/uL 3.71 (L)  Hemoglobin     12.0 - 15.0 g/dL 11.1 (L)  HCT     36.0 - 46.0 % 35.4 (L)  MCV  80.0 - 100.0 fL 95.4  MCH     26.0 - 34.0 pg 29.9  MCHC     30.0 - 36.0 g/dL 31.4  RDW     11.5 - 15.5 % 16.4 (H)  Platelets     150 - 400 K/uL 104 (L)  nRBC     0.0 - 0.2 % 0.0  Neutrophils     % 37  NEUT#     1.7 - 7.7 K/uL 1.2 (L)  Lymphocytes     % 51  Lymphocyte #     0.7 - 4.0 K/uL 1.6  Monocytes Relative     % 12  Monocyte #     0.1 - 1.0 K/uL 0.4  Eosinophil     % 0  Eosinophils Absolute     0.0 - 0.5 K/uL 0.0  Basophil     % 0  Basophils Absolute     0.0 - 0.1 K/uL 0.0  Immature Granulocytes     % 0  Abs Immature Granulocytes     0.00 - 0.07 K/uL 0.00  Sodium      135 - 145 mmol/L 145  Potassium     3.5 - 5.1 mmol/L 4.0  Chloride     98 - 111 mmol/L 111  CO2     22 - 32 mmol/L 25  Glucose     70 - 99 mg/dL 117 (H)  BUN     8 - 23 mg/dL 12  Creatinine     0.44 - 1.00 mg/dL 1.10 (H)  Calcium     8.9 - 10.3 mg/dL 9.3  Total Protein     6.5 - 8.1 g/dL 7.2  Albumin     3.5 - 5.0 g/dL 3.8  AST     15 - 41 U/L 15  ALT     0 - 44 U/L 13  Alkaline Phosphatase     38 - 126 U/L 85  Total Bilirubin     0.3 - 1.2 mg/dL 0.5  GFR, Est Non African American     >60 mL/min 53 (L)  GFR, Est African American     >60 mL/min >60  Anion gap     5 - 15 9  Prothrombin Time     11.4 - 15.2 seconds 12.7  INR      0.96  APTT     24 - 36 seconds 30  Fibrinogen     210 - 475 mg/dL 272    RADIOGRAPHIC STUDIES: I have personally reviewed the radiological images as listed and agreed with the findings in the report. Mm 3d Screen Breast Bilateral  Result Date: 10/15/2018 CLINICAL DATA:  Screening. EXAM: DIGITAL SCREENING BILATERAL MAMMOGRAM WITH TOMO AND CAD COMPARISON:  Previous exam(s). ACR Breast Density Category b: There are scattered areas of fibroglandular density. FINDINGS: There are no findings suspicious for malignancy. Images were processed with CAD. IMPRESSION: No mammographic evidence of malignancy. A result letter of this screening mammogram will be mailed directly to the patient. RECOMMENDATION: Screening mammogram in one year. (Code:SM-B-01Y) BI-RADS CATEGORY  1: Negative. Electronically Signed   By: Fidela Salisbury M.D.   On: 10/15/2018 11:10   US Pelvic Complete With Transvaginal  Result Date: 11/02/2018 CLINICAL DATA:  Right pelvic pain for 8 months EXAM: TRANSABDOMINAL AND TRANSVAGINAL ULTRASOUND OF PELVIS TECHNIQUE: Both transabdominal and transvaginal ultrasound examinations of the pelvis were performed. Transabdominal technique was performed for global imaging of the pelvis including uterus, ovaries, adnexal regions, and pelvic  cul-de-sac. It was necessary to proceed with endovaginal exam following the transabdominal exam to visualize the uterus endometrium adnexa. COMPARISON:  None FINDINGS: Uterus Measurements: 7.4 by 2.9 x 3.5 cm = volume: 39.6 mL. Heterogeneous echotexture. Exophytic mass off the mid anterior wall measuring 2.3 x 2 x 2.1 cm. Exophytic mass off the left lower uterine segment measuring 1.5 x 1.3 x 1.9 cm. Endometrium Thickness: 2.5 mm.  No focal abnormality visualized. Right ovary Not seen Left ovary Not seen Other findings No abnormal free fluid. IMPRESSION: 1. Heterogenous uterus with at least 2 exophytic masses as described above, presumably representing fibroids. 2. The ovaries are non identified. Electronically Signed   By: Donavan Foil M.D.   On: 11/02/2018 15:51    ASSESSMENT & PLAN:   63 y.o. female with  1. Minor Spontaneous?/undetected trauma causing bruising No ecchymosis with bruising  Clotting parameters normal from 06/17/17 with normal PT, aPTT and VWD panel and platelet function testing 01/08/18 PT and aPTT, fibrinogen WNL  2. Thrombocytopenia Pt's PLT in 10/19/14 were normal at 215k when she was having abnormal bruising Labwork from 12/25/17; mild anemia with HGB at 11.5, ANC at 1.2k, PLT at 93k  3. Neutropenia ANC 1.1k  PLAN: -Discussed pt labwork, 11/02/18; all values are WNL except for WBC at 3.1K, RBC at 3.43, Hgb at 10.4, HCT at 32.8, RDW at 17.3, PLTs at 87K, nRBC at 0.7, Neutro Abs at 1.1K, Glucose at 147, Creatinine at 1.13, AST at 13.  -Discussed 11/02/2018 Ferritin at 78 -Discussed 11/02/2018 Vitamin B12 at 3035 -Discussed 11/02/2018 Haptoglobin at <10 -Discussed 11/02/2018 Sed Rate at 26 -Discussed 11/02/2018 LDH at 202 -Discussed 11/02/2018 Folate RBC is as follows: Folate Hemolysate at 294.0, HCT at 32.1, Folate RBC at 916. -Discussed 11/02/2018 MMP is as follows: IgG at 1073, IgA at 369, IgM at 226, Total Protein ELP at 6.5, Albumin at 3.7, Alpha 1 at 0.2, Alpha2 at  0.5, B-Globulin at 0.9, Gamma Glob at 1.1, M Protein at 0.2, Total Globulin at 2.8, Albumin/Glob at 2.8, IFE shows "Immunofixation shows IgG monoclonal protein with kappa light chain specificity." -Discussed that I do not have a clear explanation for her increased bruising, at PLT >50k there is also not explanation of ease of bruising. -Amlodipine as causing increased bruisability as well? -No overt evidence of significant bleeding disorder clinically or based on screening labs -Pt will continue to monitor for skin rashes and raised bruises -No obvious vitamin deficiency  -Discussed that Meloxicam is known to cause bruising, and her monthly use could be associated with onset of her bruising -Minimize NSAIDs -Advised pt that we have to balance her pain management with her PLTs -Discussed monitoring with labs vs. more extensive workup (US spleen/liver or BM Bx)  -Will order [place] Abd for spleen/liver in 1 week -Will order BM Bx in 1 week  -Will see back in 2 weeks via phone   FOLLOW UP: CT bone marrow biopsy in 1 week with labs [place] abd to evaluate for liver disease/splenomegaly in 1 week Phone visit with Dr Irene Limbo in 2 weeks   The total time spent in the appt was 27 minutes and more than 50% was on counseling and direct patient cares.  All of the patient's questions were answered with apparent satisfaction. The patient knows to call the clinic with any problems, questions or concerns.    Sullivan Lone MD [place] AAHIVMS [facility] Hematology/Oncology Physician [facility]  (Office):       [phone] (Work  cell):  [phone] (Fax):           [phone]  11/09/2018 3:46 PM  I, Yevette Edwards, am acting as a scribe for Dr. Sullivan Lone.   .I have reviewed the above documentation for accuracy and completeness, and I agree with the above. Brunetta Genera MD

## 2018-11-09 ENCOUNTER — Other Ambulatory Visit: Payer: Self-pay

## 2018-11-09 ENCOUNTER — Inpatient Hospital Stay (HOSPITAL_BASED_OUTPATIENT_CLINIC_OR_DEPARTMENT_OTHER): Payer: 59 | Admitting: Hematology

## 2018-11-09 VITALS — BP 135/72 | HR 72 | Temp 97.8°F | Resp 17 | Ht 66.0 in | Wt 200.0 lb

## 2018-11-09 DIAGNOSIS — D61818 Other pancytopenia: Secondary | ICD-10-CM

## 2018-11-09 DIAGNOSIS — R233 Spontaneous ecchymoses: Secondary | ICD-10-CM | POA: Diagnosis not present

## 2018-11-10 ENCOUNTER — Telehealth: Payer: Self-pay | Admitting: Hematology

## 2018-11-10 NOTE — Telephone Encounter (Signed)
Scheduled appt per 9/21 los.  Left a voice message of appt date and time. °

## 2018-11-16 ENCOUNTER — Other Ambulatory Visit: Payer: Self-pay

## 2018-11-16 ENCOUNTER — Ambulatory Visit (HOSPITAL_COMMUNITY)
Admission: RE | Admit: 2018-11-16 | Discharge: 2018-11-16 | Disposition: A | Discharge status: Home / Self Care | Payer: 59 | Source: Ambulatory Visit | Attending: Hematology | Admitting: Hematology

## 2018-11-16 DIAGNOSIS — D61818 Other pancytopenia: Secondary | ICD-10-CM | POA: Insufficient documentation

## 2018-11-24 ENCOUNTER — Telehealth: Payer: Self-pay | Admitting: Hematology

## 2018-11-25 ENCOUNTER — Other Ambulatory Visit: Payer: Self-pay | Admitting: Radiology

## 2018-11-25 ENCOUNTER — Inpatient Hospital Stay: Payer: 59 | Admitting: Hematology

## 2018-11-25 ENCOUNTER — Telehealth: Payer: Self-pay | Admitting: Hematology

## 2018-11-25 NOTE — Telephone Encounter (Signed)
Scheduled appt per 10/7 sch message - pt aware of appt date and time

## 2018-11-26 ENCOUNTER — Ambulatory Visit (HOSPITAL_COMMUNITY)
Admission: RE | Admit: 2018-11-26 | Discharge: 2018-11-26 | Disposition: A | Discharge status: Home / Self Care | Payer: 59 | Source: Ambulatory Visit | Attending: Hematology | Admitting: Hematology

## 2018-11-26 ENCOUNTER — Other Ambulatory Visit: Payer: Self-pay

## 2018-11-26 ENCOUNTER — Encounter (HOSPITAL_COMMUNITY): Payer: Self-pay

## 2018-11-26 DIAGNOSIS — D61818 Other pancytopenia: Secondary | ICD-10-CM | POA: Diagnosis not present

## 2018-11-26 DIAGNOSIS — M109 Gout, unspecified: Secondary | ICD-10-CM | POA: Insufficient documentation

## 2018-11-26 DIAGNOSIS — Z87891 Personal history of nicotine dependence: Secondary | ICD-10-CM | POA: Diagnosis not present

## 2018-11-26 DIAGNOSIS — I429 Cardiomyopathy, unspecified: Secondary | ICD-10-CM | POA: Diagnosis present

## 2018-11-26 DIAGNOSIS — E669 Obesity, unspecified: Secondary | ICD-10-CM | POA: Diagnosis not present

## 2018-11-26 DIAGNOSIS — I10 Essential (primary) hypertension: Secondary | ICD-10-CM | POA: Insufficient documentation

## 2018-11-26 LAB — BASIC METABOLIC PANEL
Anion gap: 9 (ref 5–15)
BUN: 16 mg/dL (ref 8–23)
CO2: 19 mmol/L — ABNORMAL LOW (ref 22–32)
Calcium: 8.9 mg/dL (ref 8.9–10.3)
Chloride: 110 mmol/L (ref 98–111)
Creatinine, Ser: 0.85 mg/dL (ref 0.44–1.00)
GFR calc Af Amer: >60 mL/min (ref >60)
GFR calc non Af Amer: >60 mL/min (ref >60)
Glucose, Bld: 115 mg/dL — ABNORMAL HIGH (ref 70–99)
Potassium: 3.8 mmol/L (ref 3.5–5.1)
Sodium: 138 mmol/L (ref 135–145)

## 2018-11-26 LAB — CBC WITH DIFFERENTIAL/PLATELET
Abs Immature Granulocytes: 0.01 10*3/uL (ref 0.00–0.07)
Basophils Absolute: 0 10*3/uL (ref 0.0–0.1)
Basophils Relative: 0 %
Eosinophils Absolute: 0 10*3/uL (ref 0.0–0.5)
Eosinophils Relative: 0 %
HCT: 31.5 % — ABNORMAL LOW (ref 36.0–46.0)
Hemoglobin: 9.9 g/dL — ABNORMAL LOW (ref 12.0–15.0)
Immature Granulocytes: 0 %
Lymphocytes Relative: 57 %
Lymphs Abs: 1.8 10*3/uL (ref 0.7–4.0)
MCH: 30.6 pg (ref 26.0–34.0)
MCHC: 31.4 g/dL (ref 30.0–36.0)
MCV: 97.2 fL (ref 80.0–100.0)
Monocytes Absolute: 0.5 10*3/uL (ref 0.1–1.0)
Monocytes Relative: 14 %
Neutro Abs: 0.9 10*3/uL — ABNORMAL LOW (ref 1.7–7.7)
Neutrophils Relative %: 29 %
Platelets: 80 10*3/uL — ABNORMAL LOW (ref 150–400)
RBC: 3.24 MIL/uL — ABNORMAL LOW (ref 3.87–5.11)
RDW: 17.6 % — ABNORMAL HIGH (ref 11.5–15.5)
WBC: 3.1 10*3/uL — ABNORMAL LOW (ref 4.0–10.5)
nRBC: 1.3 % — ABNORMAL HIGH (ref 0.0–0.2)

## 2018-11-26 LAB — PROTIME-INR
INR: 1 (ref 0.8–1.2)
Prothrombin Time: 13 seconds (ref 11.4–15.2)

## 2018-11-26 MED ORDER — FENTANYL CITRATE (PF) 100 MCG/2ML IJ SOLN
INTRAMUSCULAR | Status: AC
  Filled 2018-11-26: qty 2

## 2018-11-26 MED ORDER — MIDAZOLAM HCL 2 MG/2ML IJ SOLN
INTRAMUSCULAR | Status: AC | PRN
  Administered 2018-11-26 (×3): 1 mg via INTRAVENOUS

## 2018-11-26 MED ORDER — MIDAZOLAM HCL 2 MG/2ML IJ SOLN
INTRAMUSCULAR | Status: AC
  Filled 2018-11-26: qty 4

## 2018-11-26 MED ORDER — FENTANYL CITRATE (PF) 100 MCG/2ML IJ SOLN
INTRAMUSCULAR | Status: AC | PRN
  Administered 2018-11-26 (×2): 50 ug via INTRAVENOUS

## 2018-11-26 MED ORDER — SODIUM CHLORIDE 0.9 % IV SOLN
INTRAVENOUS | Status: DC
  Administered 2018-11-26: 08:00:00 via INTRAVENOUS

## 2018-11-26 NOTE — Discharge Instructions (Signed)
If you have any question or concerns call IR clinic [phone])  or after hours [phone] and ask for IR doctor on call.  Moderate Conscious Sedation, Adult, Care After These instructions provide you with information about caring for yourself after your procedure. Your health care provider may also give you more specific instructions. Your treatment has been planned according to current medical practices, but problems sometimes occur. Call your health care provider if you have any problems or questions after your procedure. What can I expect after the procedure? After your procedure, it is common:  To feel sleepy for several hours.  To feel clumsy and have poor balance for several hours.  To have poor judgment for several hours.  To vomit if you eat too soon. Follow these instructions at home: For at least 24 hours after the procedure:   Do not: ? Participate in activities where you could fall or become injured. ? Drive. ? Use heavy machinery. ? Drink alcohol. ? Take sleeping pills or medicines that cause drowsiness. ? Make important decisions or sign legal documents. ? Take care of children on your own.  Rest. Eating and drinking  Follow the diet recommended by your health care provider.  If you vomit: ? Drink water, juice, or soup when you can drink without vomiting. ? Make sure you have little or no nausea before eating solid foods. General instructions  Have a responsible adult stay with you until you are awake and alert.  Take over-the-counter and prescription medicines only as told by your health care provider.  If you smoke, do not smoke without supervision.  Keep all follow-up visits as told by your health care provider. This is important. Contact a health care provider if:  You keep feeling nauseous or you keep vomiting.  You feel light-headed.  You develop a rash.  You have a fever. Get help right away if:  You have trouble breathing. This  information is not intended to replace advice given to you by your health care provider. Make sure you discuss any questions you have with your health care provider. Document Released: 11/25/2012 Document Revised: 01/17/2017 Document Reviewed: 05/27/2015 Elsevier Patient Education  2020 [place]. Bone Marrow Aspiration and Bone Marrow Biopsy, Adult, Care After This sheet gives you information about how to care for yourself after your procedure. Your health care provider may also give you more specific instructions. If you have problems or questions, contact your health care provider. What can I expect after the procedure? After the procedure, it is common to have:  Mild pain and tenderness.  Swelling.  Bruising. Follow these instructions at home: Puncture site care      Follow instructions from your health care provider about how to take care of the puncture site. Make sure you: ? Wash your hands with soap and water before you change your bandage (dressing). If soap and water are not available, use hand sanitizer. ? Change your dressing as told by your health care provider.  Check your puncture siteevery day for signs of infection. Check for: ? More redness, swelling, or pain. ? More fluid or blood. ? Warmth. ? Pus or a bad smell. General instructions  Take over-the-counter and prescription medicines only as told by your health care provider.  Do not take baths, swim, or use a hot tub until your health care provider approves. Ask if you can take a shower or have a sponge bath.  Return to your normal activities as told by your health care  provider. Ask your health care provider what activities are safe for you.  Do not drive for 24 hours if you were given a medicine to help you relax (sedative) during your procedure.  Keep all follow-up visits as told by your health care provider. This is important. Contact a health care provider if:  Your pain is not controlled with  medicine. Get help right away if:  You have a fever.  You have more redness, swelling, or pain around the puncture site.  You have more fluid or blood coming from the puncture site.  Your puncture site feels warm to the touch.  You have pus or a bad smell coming from the puncture site. These symptoms may represent a serious problem that is an emergency. Do not wait to see if the symptoms will go away. Get medical help right away. Call your local emergency services (911 in the [place] Do not drive yourself to the hospital. Summary  After the procedure, it is common to have mild pain, tenderness, swelling, and bruising.  Follow instructions from your health care provider about how to take care of the puncture site.  Get help right away if you have any symptoms of infection or if you have more blood or fluid coming from the puncture site. This information is not intended to replace advice given to you by your health care provider. Make sure you discuss any questions you have with your health care provider. Document Released: 08/24/2004 Document Revised: 05/20/2017 Document Reviewed: 07/19/2015 Elsevier Patient Education  2020 [employer].

## 2018-11-26 NOTE — Consult Note (Signed)
Chief Complaint: Patient was seen in consultation today for CT-guided bone marrow biopsy  Referring Physician(s): Brunetta Genera  Supervising Physician: Jacqulynn Cadet  Patient Status: [facility] - Out-pt  History of Present Illness: Karen Waller is a 63 y.o. female with past medical history of cardiomyopathy, obesity, gout, and hypertension who presents now with progressive pancytopenia and monoclonal paraproteinemia.  She is scheduled today for CT-guided bone marrow biopsy for further evaluation.  Past Medical History:  Diagnosis Date  . Cardiomyopathy    EF 30-35% diagnosed 02/2008  . Central obesity   . Gout    left knee  . HTN (hypertension)     Past Surgical History:  Procedure Laterality Date  . KNEE SURGERY    . TONSILLECTOMY      Allergies: Sulfa antibiotics and Codeine  Medications: Prior to Admission medications   Medication Sig Start Date End Date Taking? Authorizing Provider  amLODipine (NORVASC) 10 MG tablet TAKE 1 TABLET BY MOUTH DAILY 02/17/18  Yes Josue Hector, MD  candesartan (ATACAND) 16 MG tablet TAKE 1 TABLET BY MOUTH DAILY 06/18/18  Yes Josue Hector, MD  carvedilol (COREG) 6.25 MG tablet TAKE 1 TABLET BY MOUTH TWICE DAILY WITH MEALS 02/17/18  Yes Josue Hector, MD  furosemide (LASIX) 20 MG tablet Take 0.5 tablets (10 mg total) by mouth daily. 04/20/18  Yes Josue Hector, MD  hydrALAZINE (APRESOLINE) 25 MG tablet TAKE 1 TABLET BY MOUTH TWICE DAILY 02/17/18  Yes Josue Hector, MD  meloxicam (MOBIC) 7.5 MG tablet TAKE 1 TABLET BY MOUTH TWICE DAILY AS NEEDED 06/26/17  Yes Leandrew Koyanagi, MD  Multiple Vitamin (MULTIVITAMIN) capsule Take 1 capsule by mouth daily.     Yes [provider]  Cholecalciferol (VITAMIN D3) 2000 units capsule Take 2,000 Units by mouth daily.    [provider]  diclofenac sodium (VOLTAREN) 1 % GEL Apply 4 g topically 4 (four) times daily as needed. 03/24/18   Hilts, Legrand Como, MD     Family History   Problem Relation Age of Onset  . Hypertension Mother   . Breast cancer Neg Hx     Social History   Socioeconomic History  . Marital status: Married    Spouse name: Not on file  . Number of children: 2  . Years of education: Not on file  . Highest education level: Not on file  Occupational History  . Occupation: [employer] officer  . Financial resource strain: Not on file  . Food insecurity    Worry: Not on file    Inability: Not on file  . Transportation needs    Medical: Not on file    Non-medical: Not on file  Tobacco Use  . Smoking status: Former Smoker    Quit date: 02/19/1984    Years since quitting: 34.7  . Smokeless tobacco: Never Used  Substance and Sexual Activity  . Alcohol use: Not Currently  . Drug use: Never  . Sexual activity: Not on file  Lifestyle  . Physical activity    Days per week: Not on file    Minutes per session: Not on file  . Stress: Not on file  Relationships  . Social Herbalist on phone: Not on file    Gets together: Not on file    Attends religious service: Not on file    Active member of club or organization: Not on file    Attends meetings of clubs or organizations: Not  on file    Relationship status: Not on file  Other Topics Concern  . Not on file  Social History Narrative  . Not on file      Review of Systems currently denies fever, headache, chest pain, dyspnea, cough, nausea, vomiting or bleeding.  She does have some intermittent lower abdominal and back pain  Vital Signs: BP 139/80   Pulse 78   Temp 98.6 F (37 C) (Oral)   Resp 18   SpO2 98%   Physical Exam awake, alert.  Chest clear to auscultation bilaterally.  Heart with regular rate and rhythm.  Abdomen obese, soft, positive bowel sounds, mild lower abdominal tenderness to palpation.  Extremities with full range of motion.  Imaging: US Abdomen Complete  Result Date: 11/16/2018 CLINICAL DATA:  Progressive pancytopenia. Evaluate liver and spleen.  EXAM: ABDOMEN ULTRASOUND COMPLETE COMPARISON:  None. FINDINGS: Gallbladder: . Numerous gallbladder wall is hyperechoic 2.6 millimeters shadowing foci are identified throughout the gallbladder consistent with small stones, estimated to be on the order of 5 millimeters in diameter. No sonographic Murphy sign. No pericholecystic fluid. Common bile duct: Diameter: 2.1 millimeters Liver: No focal lesion identified. Within normal limits in parenchymal echogenicity. Portal vein is patent on color Doppler imaging with normal direction of blood flow towards the liver. IVC: No abnormality visualized. Pancreas: Visualized portion unremarkable. Spleen: Size and appearance within normal limits. Right Kidney: Length: 11.8 centimeters. Echogenicity within normal limits. No mass or hydronephrosis visualized. Left Kidney: Length: 10.3 centimeters. Echogenicity within normal limits. No mass or hydronephrosis visualized. Abdominal aorta: No aneurysm visualized. Other findings: None. IMPRESSION: Cholelithiasis. No evidence for acute  abnormality. Electronically Signed   By: Nolon Nations M.D.   On: 11/16/2018 08:41   US Pelvic Complete With Transvaginal  Result Date: 11/02/2018 CLINICAL DATA:  Right pelvic pain for 8 months EXAM: TRANSABDOMINAL AND TRANSVAGINAL ULTRASOUND OF PELVIS TECHNIQUE: Both transabdominal and transvaginal ultrasound examinations of the pelvis were performed. Transabdominal technique was performed for global imaging of the pelvis including uterus, ovaries, adnexal regions, and pelvic cul-de-sac. It was necessary to proceed with endovaginal exam following the transabdominal exam to visualize the uterus endometrium adnexa. COMPARISON:  None FINDINGS: Uterus Measurements: 7.4 by 2.9 x 3.5 cm = volume: 39.6 mL. Heterogeneous echotexture. Exophytic mass off the mid anterior wall measuring 2.3 x 2 x 2.1 cm. Exophytic mass off the left lower uterine segment measuring 1.5 x 1.3 x 1.9 cm. Endometrium Thickness: 2.5  mm.  No focal abnormality visualized. Right ovary Not seen Left ovary Not seen Other findings No abnormal free fluid. IMPRESSION: 1. Heterogenous uterus with at least 2 exophytic masses as described above, presumably representing fibroids. 2. The ovaries are non identified. Electronically Signed   By: Donavan Foil M.D.   On: 11/02/2018 15:51    Labs:  CBC: Recent Labs    01/08/18 1122 07/08/18 1444 11/02/18 1423 11/02/18 1424 11/26/18 0748  WBC 3.1* 3.0* 3.1*  --  3.1*  HGB 11.1* 10.9* 10.4*  --  9.9*  HCT 35.4* 34.7* 32.8* 32.1* 31.5*  PLT 104* 89* 87*  --  80*    COAGS: Recent Labs    01/08/18 1122 11/26/18 0748  INR 0.96 1.0  APTT 30  --     BMP: Recent Labs    01/08/18 1122 07/08/18 1444 11/02/18 1423 11/26/18 0748  NA 145 141 142 138  K 4.0 4.0 3.8 3.8  CL 111 108 109 110  CO2 _0 19*  GLUCOSE 117* 117*  147* 115*  BUN _0 CALCIUM 9.3 9.1 9.1 8.9  CREATININE 1.10* 1.06* 1.13* 0.85  GFRNONAA 53* 56* 52* >60  GFRAA >60 >60 >60 >60    LIVER FUNCTION TESTS: Recent Labs    01/08/18 1122 07/08/18 1444 11/02/18 1423  BILITOT 0.5 0.5 0.4  AST 15 14* 13*  ALT _1 ALKPHOS 85 80 79  PROT 7.2 7.5 7.2  ALBUMIN 3.8 4.0 4.0    TUMOR MARKERS: No results for input(s): AFPTM, CEA, CA199, CHROMGRNA in the last 8760 hours.  Assessment and Plan: 63 y.o. female with past medical history of cardiomyopathy, obesity, gout, and hypertension who presents now with progressive pancytopenia and monoclonal paraproteinemia.  She is scheduled today for CT-guided bone marrow biopsy for further evaluation.Risks and benefits of procedure was discussed with the patient  including, but not limited to bleeding, infection, damage to adjacent structures or low yield requiring additional tests.  All of the questions were answered and there is agreement to proceed.  Consent signed and in chart.     Thank you for this interesting consult.  I greatly enjoyed  meeting Lolah AMERI CAHOON and look forward to participating in their care.  A copy of this report was sent to the requesting provider on this date.  Electronically Signed: D. Rowe Robert, PA-C 11/26/2018, 8:31 AM   I spent a total of  20 minutes   in face to face in clinical consultation, greater than 50% of which was counseling/coordinating care for CT-guided bone marrow biopsy

## 2018-11-26 NOTE — Procedures (Signed)
Interventional Radiology Procedure Note  Procedure: CT guided aspirate and core biopsy of right iliac bone Complications: None Recommendations: - Bedrest supine x 1 hrs - Hydrocodone PRN  Pain - Follow biopsy results  Signed,  Ruble Pumphrey K. Ravneet Spilker, MD   

## 2018-11-27 ENCOUNTER — Other Ambulatory Visit: Payer: Self-pay

## 2018-11-30 ENCOUNTER — Telehealth: Payer: Self-pay | Admitting: Hematology

## 2018-11-30 LAB — SURGICAL PATHOLOGY

## 2018-11-30 NOTE — Progress Notes (Signed)
HEMATOLOGY/ONCOLOGY CLINIC NOTE  Date of Service: 12/01/2018  Patient Care Team: Carol Ada, MD as PCP - General (Family Medicine) Josue Hector, MD as PCP - Cardiology (Cardiology)  CHIEF COMPLAINTS/PURPOSE OF CONSULTATION:  Spontaneous bruising  Pancytopenia  HISTORY OF PRESENTING ILLNESS:   Karen Waller is a wonderful 63 y.o. female who has been previously followed by my colleague Dr. Grace Waller for evaluation and management of Spontaneous bruising. The pt reports that she is doing well overall.   The pt reports that she has continued to have spontaneous bruising, without raised lumps. She first noticed this in the last two years and takes Meloxicam and Amlodipine regularly, and aspirin very rarely. She also notes that she does not always bruise in response to bumping into objects.   She denies any GI bleeding, large muscle hematoma, bleeding into her joints. She notes that she did not have heavy periods in the past. She has had knee surgery and a tonsillectomy without bleeding complications.   Most recent lab results (06/17/17) of CBC w/diff and CMP is as follows: all values are WNL except for RDW at 17.0, PLT at 118k.  On review of systems, pt reports bruising easily, and denies fevers, chills, night sweats, abdominal pains, leg swelling, and any other symptoms.   On PMHx the pt reports tonsillectomy and knee surgery. On Family Hx the pt denies blood disorders, clotting disorders.  Interval History:    I connected with  Karen Waller on 12/01/18 by telephone and verified that I am speaking with the correct person using two identifiers.   I discussed the limitations of evaluation and management by telemedicine. The patient expressed understanding and agreed to proceed.  Other persons participating in the visit and their role in the encounter:     -Karen Waller, Medical Scribe  Patient's location: Home Provider's location: [facility] at  [place] returns today for management and evaluation of easy bruisability. The patient's last visit with [place] was on 11/09/2018. The pt reports that she is doing well overall.  The pt reports she a rash on her shoulder that began as 4-5 large bumps. She thought that they were mosquito bites. The bumps have since gone down and have a red rash-like appearance. She currently has a large bruise on her leg that is sore. She reports that there is a bump in the middle of her bruise and it is warm to the touch. Pt is not longer using Meloxicam and has not yet started the Voltaren cream. She has been on Furosemide for 4-5 years and is still taking extra-stregth Tylenol for pain relief. Pt has a multivitamin that she has not been completely consistent with. She is also taking Cholecalciferol 2000IU per day. Pt has never been on Vitamin B12 replacement. Pt does not take any OTC herbs or cook with any unusual spices. Pt feels that her HTN has been well controlled. She still feels light headed occasionally. Pt is scheduled to see an Editor, commissioning on 10/24.   Of note since the patient's last visit, pt has had US Abdomen ([id number]) completed on 11/16/2018 with results revealing "Cholelithiasis. No evidence for acute abnormality."  Pt has had BM Bx ([id number]) completed on 11/26/2018 with results revealing "-Hypercellular bone marrow for age with trilineage hematopoiesis and fibrosis. Pancytopenia"  Pt has had BM Flow Pathology Report [id number]) completed on 11/26/2018 with results revealing "-No significant blastic population identified. - No monoclonal B-cell population or  abnormal T-cell phenotype identified."  Lab results (11/26/18) of CBC w/diff and BMP is as follows: all values are WNL except for 3.1K, RBC at 3.24, Hgb at 9.9, HCT at 31.5, RDW at 17.6, PLTs at 80K, nRBC at 1.3, Neutro Abs at 0.9K, Polychromasia is "PRESENT", Target Cells are "PRESENT", CO2 at 19, Glucose at 115.    11/26/2018 Prothrombin Time at 13.0, INR at 1.0  On review of systems, pt reports rash, occasional lightheadedness, bruising and denies fevers, chills and any other symptoms.    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 strain: Not on file   Food insecurity    Worry: Not on file    Inability: Not on file   Transportation needs    Medical: Not on file    Non-medical: Not on file  Tobacco Use   Smoking status: Former Smoker    Quit date: 02/19/1984    Years since quitting: 34.8   Smokeless tobacco: Never Used  Substance and Sexual Activity   Alcohol use: Not Currently   Drug use: Never   Sexual activity: Not on file  Lifestyle   Physical activity    Days per week: Not on file    Minutes per session: Not on file   Stress: Not on file  Relationships   Social connections    Talks on phone: Not on file    Gets together: Not on file    Attends religious service: Not on file    Active member of club or organization: Not on file    Attends meetings of clubs or organizations: Not on file    Relationship status: Not on file   Intimate partner violence    Fear of current or ex partner: Not on file    Emotionally abused: Not on file    Physically abused: Not on file    Forced sexual activity: Not on file  Other Topics Concern   Not on file  Social History Narrative   Not on file    FAMILY HISTORY: Family History  Problem  Relation Age of Onset   Hypertension Mother    Breast cancer Neg Hx     ALLERGIES:  is allergic to sulfa antibiotics and codeine.  MEDICATIONS:  Current Outpatient Medications  Medication Sig Dispense Refill   amLODipine (NORVASC) 10 MG tablet TAKE 1 TABLET BY MOUTH DAILY 30 tablet 11   candesartan (ATACAND) 16 MG tablet TAKE 1 TABLET BY MOUTH DAILY 90 tablet 2   carvedilol (COREG) 6.25 MG tablet TAKE 1 TABLET BY MOUTH TWICE DAILY WITH MEALS 180 tablet 3   Cholecalciferol (VITAMIN D3) 2000 units capsule Take 2,000 Units by mouth daily.     diclofenac sodium (VOLTAREN) 1 % GEL Apply 4 g topically 4 (four) times daily as needed. 500 g 6   furosemide (LASIX) 20 MG  tablet Take 0.5 tablets (10 mg total) by mouth daily. 15 tablet 9   hydrALAZINE (APRESOLINE) 25 MG tablet TAKE 1 TABLET BY MOUTH TWICE DAILY 180 tablet 3   meloxicam (MOBIC) 7.5 MG tablet TAKE 1 TABLET BY MOUTH TWICE DAILY AS NEEDED 60 tablet 0   Multiple Vitamin (MULTIVITAMIN) capsule Take 1 capsule by mouth daily.       No current facility-administered medications for this visit.     REVIEW OF SYSTEMS:    A 10+ POINT REVIEW OF SYSTEMS WAS OBTAINED including neurology, dermatology, psychiatry, cardiac, respiratory, lymph, extremities, GI, GU, Musculoskeletal, constitutional, breasts, reproductive, HEENT.  All pertinent positives are noted in the HPI.  All others are negative.   PHYSICAL EXAMINATION:  There were no vitals filed for this visit. There were no vitals filed for this visit. .There is no height or weight on file to calculate BMI.  Phone visit  LABORATORY DATA:  I have reviewed the data as listed  . CBC Latest Ref Rng & Units 11/26/2018 11/02/2018 11/02/2018  WBC 4.0 - 10.5 K/uL 3.1(L) 3.1(L) -  Hemoglobin 12.0 - 15.0 g/dL 9.9(L) 10.4(L) -  Hematocrit 36.0 - 46.0 % 31.5(L) 32.1(L) 32.8(L)  Platelets 150 - 400 K/uL 80(L) 87(L) -    . CMP Latest Ref Rng & Units 11/26/2018 11/02/2018 07/08/2018    Glucose 70 - 99 mg/dL 115(H) 147(H) 117(H)  BUN 8 - 23 mg/dL 16 20 18   Creatinine 0.44 - 1.00 mg/dL 0.85 1.13(H) 1.06(H)  Sodium 135 - 145 mmol/L 138 142 141  Potassium 3.5 - 5.1 mmol/L 3.8 3.8 4.0  Chloride 98 - 111 mmol/L 110 109 108  CO2 22 - 32 mmol/L 19(L) 24 24  Calcium 8.9 - 10.3 mg/dL 8.9 9.1 9.1  Total Protein 6.5 - 8.1 g/dL - 7.2 7.5  Total Bilirubin 0.3 - 1.2 mg/dL - 0.4 0.5  Alkaline Phos 38 - 126 U/L - 79 80  AST 15 - 41 U/L - 13(L) 14(L)  ALT 0 - 44 U/L - 12 16   Component     Latest Ref Rng & Units 06/17/2017  Coagulation Factor VIII     57 - 163 % 128  Ristocetin Co-factor, Plasma     50 - 200 % 117  Von Willebrand Antigen, Plasma     50 - 200 % 126  Prothrombin Time     11.4 - 15.2 seconds 12.9  INR      0.98  PFA Interpretation        Collagen / Epinephrine     0 - 193 seconds 192  Fibrinogen     210 - 475 mg/dL 291  APTT     24 - 36 seconds 31  LDH     125 - 245 U/L 230   Component     Latest Ref Rng & Units 01/08/2018  WBC     4.0 - 10.5 K/uL 3.1 (L)  RBC     3.87 - 5.11 MIL/uL 3.71 (L)  Hemoglobin     12.0 - 15.0 g/dL 11.1 (L)  HCT     36.0 - 46.0 % 35.4 (L)  MCV     80.0 - 100.0 fL 95.4  MCH     26.0 - 34.0 pg 29.9  MCHC     30.0 - 36.0 g/dL 31.4  RDW     11.5 - 15.5 % 16.4 (H)  Platelets     150 - 400 K/uL 104 (L)  nRBC  0.0 - 0.2 % 0.0  Neutrophils     % 37  NEUT#     1.7 - 7.7 K/uL 1.2 (L)  Lymphocytes     % 51  Lymphocyte #     0.7 - 4.0 K/uL 1.6  Monocytes Relative     % 12  Monocyte #     0.1 - 1.0 K/uL 0.4  Eosinophil     % 0  Eosinophils Absolute     0.0 - 0.5 K/uL 0.0  Basophil     % 0  Basophils Absolute     0.0 - 0.1 K/uL 0.0  Immature Granulocytes     % 0  Abs Immature Granulocytes     0.00 - 0.07 K/uL 0.00  Sodium     135 - 145 mmol/L 145  Potassium     3.5 - 5.1 mmol/L 4.0  Chloride     98 - 111 mmol/L 111  CO2     22 - 32 mmol/L 25  Glucose     70 - 99 mg/dL 117 (H)  BUN     8 - 23  mg/dL 12  Creatinine     0.44 - 1.00 mg/dL 1.10 (H)  Calcium     8.9 - 10.3 mg/dL 9.3  Total Protein     6.5 - 8.1 g/dL 7.2  Albumin     3.5 - 5.0 g/dL 3.8  AST     15 - 41 U/L 15  ALT     0 - 44 U/L 13  Alkaline Phosphatase     38 - 126 U/L 85  Total Bilirubin     0.3 - 1.2 mg/dL 0.5  GFR, Est Non African American     >60 mL/min 53 (L)  GFR, Est African American     >60 mL/min >60  Anion gap     5 - 15 9  Prothrombin Time     11.4 - 15.2 seconds 12.7  INR      0.96  APTT     24 - 36 seconds 30  Fibrinogen     210 - 475 mg/dL 272   11/26/2018 BM Flow Pathology Report    11/26/2018 BM Bx Report    RADIOGRAPHIC STUDIES: I have personally reviewed the radiological images as listed and agreed with the findings in the report. US Abdomen Complete  Result Date: 11/16/2018 CLINICAL DATA:  Progressive pancytopenia. Evaluate liver and spleen. EXAM: ABDOMEN ULTRASOUND COMPLETE COMPARISON:  None. FINDINGS: Gallbladder: . Numerous gallbladder wall is hyperechoic 2.6 millimeters shadowing foci are identified throughout the gallbladder consistent with small stones, estimated to be on the order of 5 millimeters in diameter. No sonographic Murphy sign. No pericholecystic fluid. Common bile duct: Diameter: 2.1 millimeters Liver: No focal lesion identified. Within normal limits in parenchymal echogenicity. Portal vein is patent on color Doppler imaging with normal direction of blood flow towards the liver. IVC: No abnormality visualized. Pancreas: Visualized portion unremarkable. Spleen: Size and appearance within normal limits. Right Kidney: Length: 11.8 centimeters. Echogenicity within normal limits. No mass or hydronephrosis visualized. Left Kidney: Length: 10.3 centimeters. Echogenicity within normal limits. No mass or hydronephrosis visualized. Abdominal aorta: No aneurysm visualized. Other findings: None. IMPRESSION: Cholelithiasis. No evidence for acute  abnormality. Electronically  Signed   By: Nolon Nations M.D.   On: 11/16/2018 08:41   Ct Biopsy  Result Date: 11/26/2018 INDICATION: 63 year old female with pancytopenia and monoclonal paraproteinemia. She presents for bone marrow biopsy for further evaluation. EXAM: CT  GUIDED BONE MARROW ASPIRATION AND CORE BIOPSY Interventional Radiologist:  Criselda Peaches, MD MEDICATIONS: None. ANESTHESIA/SEDATION: Moderate (conscious) sedation was employed during this procedure. A total of 4 milligrams versed and 100 micrograms fentanyl were administered intravenously. The patient's level of consciousness and vital signs were monitored continuously by radiology nursing throughout the procedure under my direct supervision. Total monitored sedation time: 13 minutes FLUOROSCOPY TIME:  None COMPLICATIONS: None immediate. Estimated blood loss: <25 mL PROCEDURE: Informed written consent was obtained from the patient after a thorough discussion of the procedural risks, benefits and alternatives. All questions were addressed. Maximal Sterile Barrier Technique was utilized including caps, mask, sterile gowns, sterile gloves, sterile drape, hand hygiene and skin antiseptic. A timeout was performed prior to the initiation of the procedure. The patient was positioned prone and non-contrast localization CT was performed of the pelvis to demonstrate the iliac marrow spaces. Maximal barrier sterile technique utilized including caps, mask, sterile gowns, sterile gloves, large sterile drape, hand hygiene, and betadine prep. Under sterile conditions and local anesthesia, an 11 gauge coaxial bone biopsy needle was advanced into the right iliac marrow space. Needle position was confirmed with CT imaging. Initially, bone marrow aspiration was performed. Next, the 11 gauge outer cannula was utilized to obtain a right iliac bone marrow core biopsy. Needle was removed. Hemostasis was obtained with compression. The patient tolerated the procedure well. Samples were  prepared with the cytotechnologist. IMPRESSION: Technically successful CT-guided bone marrow biopsy and aspiration. Electronically Signed   By: Jacqulynn Cadet M.D.   On: 11/26/2018 13:30   Ct Bone Marrow Biopsy & Aspiration  Result Date: 11/26/2018 INDICATION: 63 year old female with pancytopenia and monoclonal paraproteinemia. She presents for bone marrow biopsy for further evaluation. EXAM: CT GUIDED BONE MARROW ASPIRATION AND CORE BIOPSY Interventional Radiologist:  Criselda Peaches, MD MEDICATIONS: None. ANESTHESIA/SEDATION: Moderate (conscious) sedation was employed during this procedure. A total of 4 milligrams versed and 100 micrograms fentanyl were administered intravenously. The patient's level of consciousness and vital signs were monitored continuously by radiology nursing throughout the procedure under my direct supervision. Total monitored sedation time: 13 minutes FLUOROSCOPY TIME:  None COMPLICATIONS: None immediate. Estimated blood loss: <25 mL PROCEDURE: Informed written consent was obtained from the patient after a thorough discussion of the procedural risks, benefits and alternatives. All questions were addressed. Maximal Sterile Barrier Technique was utilized including caps, mask, sterile gowns, sterile gloves, sterile drape, hand hygiene and skin antiseptic. A timeout was performed prior to the initiation of the procedure. The patient was positioned prone and non-contrast localization CT was performed of the pelvis to demonstrate the iliac marrow spaces. Maximal barrier sterile technique utilized including caps, mask, sterile gowns, sterile gloves, large sterile drape, hand hygiene, and betadine prep. Under sterile conditions and local anesthesia, an 11 gauge coaxial bone biopsy needle was advanced into the right iliac marrow space. Needle position was confirmed with CT imaging. Initially, bone marrow aspiration was performed. Next, the 11 gauge outer cannula was utilized to obtain a  right iliac bone marrow core biopsy. Needle was removed. Hemostasis was obtained with compression. The patient tolerated the procedure well. Samples were prepared with the cytotechnologist. IMPRESSION: Technically successful CT-guided bone marrow biopsy and aspiration. Electronically Signed   By: Jacqulynn Cadet M.D.   On: 11/26/2018 13:30   US Pelvic Complete With Transvaginal  Result Date: 11/02/2018 CLINICAL DATA:  Right pelvic pain for 8 months EXAM: TRANSABDOMINAL AND TRANSVAGINAL ULTRASOUND OF PELVIS TECHNIQUE: Both transabdominal and transvaginal ultrasound examinations of  the pelvis were performed. Transabdominal technique was performed for global imaging of the pelvis including uterus, ovaries, adnexal regions, and pelvic cul-de-sac. It was necessary to proceed with endovaginal exam following the transabdominal exam to visualize the uterus endometrium adnexa. COMPARISON:  None FINDINGS: Uterus Measurements: 7.4 by 2.9 x 3.5 cm = volume: 39.6 mL. Heterogeneous echotexture. Exophytic mass off the mid anterior wall measuring 2.3 x 2 x 2.1 cm. Exophytic mass off the left lower uterine segment measuring 1.5 x 1.3 x 1.9 cm. Endometrium Thickness: 2.5 mm.  No focal abnormality visualized. Right ovary Not seen Left ovary Not seen Other findings No abnormal free fluid. IMPRESSION: 1. Heterogenous uterus with at least 2 exophytic masses as described above, presumably representing fibroids. 2. The ovaries are non identified. Electronically Signed   By: Donavan Foil M.D.   On: 11/02/2018 15:51    ASSESSMENT & PLAN:   63 y.o. female with  1. Minor Spontaneous?/undetected trauma causing bruising No ecchymosis with bruising  Clotting parameters normal from 06/17/17 with normal PT, aPTT and VWD panel and platelet function testing 01/08/18 PT and aPTT, fibrinogen WNL  2. Thrombocytopenia Pt's PLT in 10/19/14 were normal at 215k when she was having abnormal bruising Labwork from 12/25/17; mild anemia with  HGB at 11.5, ANC at 1.2k, PLT at 93k  3. Neutropenia ANC 1.1k  PLAN: -Discussed pt labwork, 11/26/18; all values are WNL except for 3.1K, RBC at 3.24, Hgb at 9.9, HCT at 31.5, RDW at 17.6, PLTs at 80K, nRBC at 1.3, Neutro Abs at 0.9K, Polychromasia is "PRESENT", Target Cells are "PRESENT", CO2 at 19, Glucose at 115.  -Discussed 11/26/2018 Prothrombin Time at 13.0, INR at 1.0 -Discussed 11/16/2018 US Abdomen ([id number]) which revealed "Cholelithiasis. No evidence for acute  abnormality." -Discussed 11/26/2018 BM Bx ([id number]) which revealed "-Hypercellular bone marrow for age with trilineage hematopoiesis and fibrosis. Pancytopenia" -Discussed 11/26/2018 BM Flow Pathology Report [id number]) completed on  with results revealing "-No significant blastic population identified. - No monoclonal B-cell population or abnormal T-cell phenotype identified." -pending BM cytogenetics at this time. -Advised pt that we can do genetic testing to f/u on the underlying reason for hypercellular BM  -Discussed that I do not have a clear explanation for her increased bruising, at PLT >50k there is also not explanation of ease of bruising. -Amlodipine as causing increased bruisability as well? -No overt evidence of significant bleeding disorder clinically or based on screening labs -Pt will continue to monitor for skin rashes and raised bruises -Recommend continuing daily multivitamin -Minimize NSAIDs  The total time spent in the appt was 25 minutes and more than 50% was on counseling and direct patient cares.  All of the patient's questions were answered with apparent satisfaction. The patient knows to call the clinic with any problems, questions or concerns.    Sullivan Lone MD [place] AAHIVMS [facility] Hematology/Oncology Physician [facility]  (Office):       [phone] (Work cell):  [phone] (Fax):           [phone]  12/01/2018 3:51 PM  I, Karen Waller, am acting as a  scribe for Dr. Sullivan Lone.   .I have reviewed the above documentation for accuracy and completeness, and I agree with the above. 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 PLAN F/u in 3 months with labs  .Brunetta Genera MD

## 2018-12-01 ENCOUNTER — Inpatient Hospital Stay: Payer: 59 | Attending: Hematology | Admitting: Hematology

## 2018-12-01 DIAGNOSIS — R233 Spontaneous ecchymoses: Secondary | ICD-10-CM | POA: Diagnosis present

## 2018-12-01 DIAGNOSIS — Z87891 Personal history of nicotine dependence: Secondary | ICD-10-CM | POA: Diagnosis not present

## 2018-12-01 DIAGNOSIS — Z79899 Other long term (current) drug therapy: Secondary | ICD-10-CM | POA: Diagnosis not present

## 2018-12-01 DIAGNOSIS — D61818 Other pancytopenia: Secondary | ICD-10-CM

## 2018-12-01 DIAGNOSIS — R238 Other skin changes: Secondary | ICD-10-CM

## 2018-12-01 DIAGNOSIS — D469 Myelodysplastic syndrome, unspecified: Secondary | ICD-10-CM | POA: Diagnosis not present

## 2018-12-01 DIAGNOSIS — I10 Essential (primary) hypertension: Secondary | ICD-10-CM | POA: Diagnosis not present

## 2018-12-01 NOTE — Progress Notes (Signed)
This encounter was created in error - please disregard.

## 2018-12-02 ENCOUNTER — Telehealth: Payer: Self-pay | Admitting: Hematology

## 2018-12-02 NOTE — Telephone Encounter (Signed)
No los per 10/13 °

## 2018-12-03 ENCOUNTER — Encounter (HOSPITAL_COMMUNITY): Payer: Self-pay | Admitting: Hematology

## 2018-12-09 ENCOUNTER — Telehealth: Payer: Self-pay | Admitting: Hematology

## 2018-12-09 NOTE — Telephone Encounter (Signed)
Scheduled appt per 10/19 sch message - pt is aware of apt date and time

## 2018-12-16 ENCOUNTER — Ambulatory Visit: Payer: 59 | Admitting: Orthopaedic Surgery

## 2019-01-08 ENCOUNTER — Other Ambulatory Visit: Payer: Self-pay

## 2019-01-08 ENCOUNTER — Ambulatory Visit
Admission: EM | Admit: 2019-01-08 | Discharge: 2019-01-08 | Disposition: A | Discharge status: Home / Self Care | Payer: 59 | Attending: Physician Assistant | Admitting: Physician Assistant

## 2019-01-08 DIAGNOSIS — R21 Rash and other nonspecific skin eruption: Secondary | ICD-10-CM

## 2019-01-08 MED ORDER — HYDROXYZINE HCL 25 MG PO TABS: 25.0000 mg | ORAL_TABLET | Freq: Four times a day (QID) | ORAL | 0 refills | Status: DC

## 2019-01-08 MED ORDER — TRIAMCINOLONE ACETONIDE 0.1 % EX CREA: 1.0000 "application " | TOPICAL_CREAM | Freq: Two times a day (BID) | CUTANEOUS | 0 refills | Status: DC

## 2019-01-08 MED ORDER — TRIAMCINOLONE ACETONIDE 0.025 % EX OINT: 1.0000 "application " | TOPICAL_OINTMENT | Freq: Every evening | CUTANEOUS | 0 refills | Status: DC | PRN

## 2019-01-08 MED ORDER — SARNA 0.5-0.5 % EX LOTN: 1.0000 "application " | TOPICAL_LOTION | CUTANEOUS | 0 refills | Status: DC | PRN

## 2019-01-08 MED ORDER — PREDNISONE 20 MG PO TABS: 20.0000 mg | ORAL_TABLET | Freq: Every day | ORAL | 0 refills | Status: DC

## 2019-01-08 NOTE — Discharge Instructions (Addendum)
Start hydroxyzine and cream/ointment as directed. If itching continues, can start prednisone if needed. Ice compress. SARNA lotion if needed. If spreading redness, warmth, pain, follow up for reevaluation needed.

## 2019-01-08 NOTE — ED Triage Notes (Signed)
Pt c/o rash with raised red areas around lt elbow and upper arm. Pt c/o itching only

## 2019-01-09 NOTE — ED Provider Notes (Signed)
[facility]    CSN: [id number] Arrival date & time: 01/08/19  1921      History   Chief Complaint Chief Complaint  Patient presents with  . Rash    HPI Karen Waller is a 63 y.o. female.   63 year old female comes in for few day history of itching/rash to the left lateral elbow.  Patient has contusion to left lateral elbow that started before the itching.  States she has spontaneous bruising, which is followed by hematology.  This is not abnormal for her.  Denies any pain to the contusion.  Denies injury/trauma.  Started noticing itching to the area after without known rash.  States after scratching area, noticed rash.  Denies any spreading.  Denies new hygiene product changes.  Denies pain, swelling.  Denies spreading erythema, fever.  Has tried hydrocortisone cream with some relief.  Tried Benadryl cream without any relief.     Past Medical History:  Diagnosis Date  . Cardiomyopathy    EF 30-35% diagnosed 02/2008  . Central obesity   . Gout    left knee  . HTN (hypertension)     Patient Active Problem List   Diagnosis Date Noted  . Pain in left hip 03/24/2018  . Bruising, spontaneous 07/06/2017  . Left Achilles tendinitis 11/08/2016  . Conductive hearing loss, bilateral 08/08/2016  . Elevated lipids 12/16/2012  . HTN (hypertension) 01/05/2011  . CHF (congestive heart failure) ([place]) 10/30/2010  . Edema 10/30/2010  . Gouty arthropathy 03/17/2008  . OVERWEIGHT 03/17/2008  . ESSENTIAL HYPERTENSION, BENIGN 03/17/2008  . Non-ischemic cardiomyopathy ([place]) 03/17/2008  . PALPITATIONS, OCCASIONAL 03/17/2008    Past Surgical History:  Procedure Laterality Date  . KNEE SURGERY    . TONSILLECTOMY      OB History   No obstetric history on file.      Home Medications    Prior to Admission medications   Medication Sig Start Date End Date Taking? Authorizing Provider  amLODipine (NORVASC) 10 MG tablet TAKE 1 TABLET BY MOUTH DAILY 02/17/18   Josue Hector, MD  camphor-menthol [facility]) lotion Apply 1 application topically as needed for itching. 01/08/19   Tasia Catchings, Amy V, PA-C  candesartan (ATACAND) 16 MG tablet TAKE 1 TABLET BY MOUTH DAILY 06/18/18   Josue Hector, MD  carvedilol (COREG) 6.25 MG tablet TAKE 1 TABLET BY MOUTH TWICE DAILY WITH MEALS 02/17/18   Josue Hector, MD  Cholecalciferol (VITAMIN D3) 2000 units capsule Take 2,000 Units by mouth daily.    [provider]  diclofenac sodium (VOLTAREN) 1 % GEL Apply 4 g topically 4 (four) times daily as needed. 03/24/18   Hilts, Legrand Como, MD  furosemide (LASIX) 20 MG tablet Take 0.5 tablets (10 mg total) by mouth daily. 04/20/18   Josue Hector, MD  hydrALAZINE (APRESOLINE) 25 MG tablet TAKE 1 TABLET BY MOUTH TWICE DAILY 02/17/18   Josue Hector, MD  hydrOXYzine (ATARAX/VISTARIL) 25 MG tablet Take 1 tablet (25 mg total) by mouth every 6 (six) hours. 01/08/19   Tasia Catchings, Amy V, PA-C  meloxicam (MOBIC) 7.5 MG tablet TAKE 1 TABLET BY MOUTH TWICE DAILY AS NEEDED 06/26/17   Leandrew Koyanagi, MD  Multiple Vitamin (MULTIVITAMIN) capsule Take 1 capsule by mouth daily.      [provider]  predniSONE (DELTASONE) 20 MG tablet Take 1 tablet (20 mg total) by mouth daily. 01/08/19   Tasia Catchings, Amy V, PA-C  triamcinolone (KENALOG) 0.025 % ointment Apply 1 application topically at  bedtime as needed. 01/08/19   Tasia Catchings, Amy V, PA-C  triamcinolone cream (KENALOG) 0.1 % Apply 1 application topically 2 (two) times daily. 01/08/19   Ok Edwards, PA-C    Family History Family History  Problem Relation Age of Onset  . Hypertension Mother   . Breast cancer Neg Hx     Social History Social History   Tobacco Use  . Smoking status: Former Smoker    Quit date: 02/19/1984    Years since quitting: 34.9  . Smokeless tobacco: Never Used  Substance Use Topics  . Alcohol use: Not Currently  . Drug use: Never     Allergies   Sulfa antibiotics and Codeine   Review of Systems Review of Systems  Reason unable to  perform ROS: See HPI as above.     Physical Exam Triage Vital Signs ED Triage Vitals [01/08/19 1932]  Enc Vitals Group     BP (!) 148/98     Pulse Rate 75     Resp 18     Temp 99.2 F (37.3 C)     Temp Source Oral     SpO2 97 %     Weight      Height      Head Circumference      Peak Flow      Pain Score 0     Pain Loc      Pain Edu?      Excl. in [place]?    No data found.  Updated Vital Signs BP (!) 148/98 (BP Location: Left Arm)   Pulse 75   Temp 99.2 F (37.3 C) (Oral)   Resp 18   SpO2 97%   Visual Acuity Right Eye Distance:   Left Eye Distance:   Bilateral Distance:    Right Eye Near:   Left Eye Near:    Bilateral Near:     Physical Exam Constitutional:      General: She is not in acute distress.    Appearance: She is well-developed. She is not diaphoretic.  HENT:     Head: Normocephalic and atraumatic.  Eyes:     Conjunctiva/sclera: Conjunctivae normal.     Pupils: Pupils are equal, round, and reactive to light.  Pulmonary:     Effort: Pulmonary effort is normal. No respiratory distress.  Skin:    General: Skin is warm and dry.     Comments: No swelling noted.  Large contusion to the left lateral elbow, approximately 5 cm x 10 cm.  No tenderness to palpation.  Few isolated raised erythematous area with excoriation of the skin surrounding contusion.  Area is warm to palpation, without pain.  No other rashes seen.  Patient continues to scratch area throughout exam.  Neurological:     Mental Status: She is alert and oriented to person, place, and time.    UC Treatments / Results  Labs (all labs ordered are listed, but only abnormal results are displayed) Labs Reviewed - No data to display  EKG   Radiology No results found.  Procedures Procedures (including critical care time)  Medications Ordered in UC Medications - No data to display  Initial Impression / Assessment and Plan / UC Course  I have reviewed the triage vital signs and the  nursing notes.  Pertinent labs & imaging results that were available during my care of the patient were reviewed by me and considered in my medical decision making (see chart for details).    Discussed raised rash could  be caused by scratching. At this time, will try to control itching. Hydroxyzine and triamcinolone as directed. Patient with history of palpitations with oral prednisone, if needed, can try low dose 20mg  daily for 3 days. Otherwise, can also try SARNA lotion. Ice compress. Return precautions given. Patient expresses understanding and agrees to plan.  Final Clinical Impressions(s) / UC Diagnoses   Final diagnoses:  Rash   ED Prescriptions    Medication Sig Dispense Auth. Provider   hydrOXYzine (ATARAX/VISTARIL) 25 MG tablet Take 1 tablet (25 mg total) by mouth every 6 (six) hours. 12 tablet Yu, Amy V, PA-C   triamcinolone cream (KENALOG) 0.1 % Apply 1 application topically 2 (two) times daily. 30 g Yu, Amy V, PA-C   triamcinolone (KENALOG) 0.025 % ointment Apply 1 application topically at bedtime as needed. 30 g Yu, Amy V, PA-C   predniSONE (DELTASONE) 20 MG tablet Take 1 tablet (20 mg total) by mouth daily. 3 tablet Yu, Amy V, PA-C   camphor-menthol [facility]) lotion Apply 1 application topically as needed for itching. 222 mL Ok Edwards, PA-C     PDMP not reviewed this encounter.   Ok Edwards, PA-C 01/09/19 1104

## 2019-03-10 ENCOUNTER — Inpatient Hospital Stay: Payer: 59 | Attending: Hematology | Admitting: Hematology

## 2019-03-10 ENCOUNTER — Inpatient Hospital Stay: Payer: 59

## 2019-03-10 ENCOUNTER — Other Ambulatory Visit: Payer: Self-pay

## 2019-03-10 ENCOUNTER — Other Ambulatory Visit: Payer: Self-pay | Admitting: *Deleted

## 2019-03-10 VITALS — BP 150/80 | HR 80 | Temp 97.8°F | Resp 16 | Ht 66.0 in | Wt 194.0 lb

## 2019-03-10 DIAGNOSIS — R233 Spontaneous ecchymoses: Secondary | ICD-10-CM | POA: Insufficient documentation

## 2019-03-10 DIAGNOSIS — D469 Myelodysplastic syndrome, unspecified: Secondary | ICD-10-CM

## 2019-03-10 DIAGNOSIS — D61818 Other pancytopenia: Secondary | ICD-10-CM

## 2019-03-10 DIAGNOSIS — Z87891 Personal history of nicotine dependence: Secondary | ICD-10-CM | POA: Insufficient documentation

## 2019-03-10 DIAGNOSIS — Z79899 Other long term (current) drug therapy: Secondary | ICD-10-CM | POA: Diagnosis not present

## 2019-03-10 LAB — CBC WITH DIFFERENTIAL (CANCER CENTER ONLY)
Abs Immature Granulocytes: 0 10*3/uL (ref 0.00–0.07)
Basophils Absolute: 0 10*3/uL (ref 0.0–0.1)
Basophils Relative: 0 %
Eosinophils Absolute: 0 10*3/uL (ref 0.0–0.5)
Eosinophils Relative: 0 %
HCT: 33.4 % — ABNORMAL LOW (ref 36.0–46.0)
Hemoglobin: 10.6 g/dL — ABNORMAL LOW (ref 12.0–15.0)
Immature Granulocytes: 0 %
Lymphocytes Relative: 57 %
Lymphs Abs: 1.8 10*3/uL (ref 0.7–4.0)
MCH: 30.2 pg (ref 26.0–34.0)
MCHC: 31.7 g/dL (ref 30.0–36.0)
MCV: 95.2 fL (ref 80.0–100.0)
Monocytes Absolute: 0.4 10*3/uL (ref 0.1–1.0)
Monocytes Relative: 13 %
Neutro Abs: 0.9 10*3/uL — ABNORMAL LOW (ref 1.7–7.7)
Neutrophils Relative %: 30 %
Platelet Count: 85 10*3/uL — ABNORMAL LOW (ref 150–400)
RBC: 3.51 MIL/uL — ABNORMAL LOW (ref 3.87–5.11)
RDW: 17.7 % — ABNORMAL HIGH (ref 11.5–15.5)
WBC Count: 3.2 10*3/uL — ABNORMAL LOW (ref 4.0–10.5)
nRBC: 1.6 % — ABNORMAL HIGH (ref 0.0–0.2)

## 2019-03-10 LAB — CMP (CANCER CENTER ONLY)
ALT: 12 U/L (ref 0–44)
AST: 14 U/L — ABNORMAL LOW (ref 15–41)
Albumin: 4.1 g/dL (ref 3.5–5.0)
Alkaline Phosphatase: 85 U/L (ref 38–126)
Anion gap: 9 (ref 5–15)
BUN: 17 mg/dL (ref 8–23)
CO2: 23 mmol/L (ref 22–32)
Calcium: 8.9 mg/dL (ref 8.9–10.3)
Chloride: 110 mmol/L (ref 98–111)
Creatinine: 1.18 mg/dL — ABNORMAL HIGH (ref 0.44–1.00)
GFR, Est AFR Am: 57 mL/min — ABNORMAL LOW (ref >60)
GFR, Estimated: 49 mL/min — ABNORMAL LOW (ref >60)
Glucose, Bld: 81 mg/dL (ref 70–99)
Potassium: 4.1 mmol/L (ref 3.5–5.1)
Sodium: 142 mmol/L (ref 135–145)
Total Bilirubin: 0.5 mg/dL (ref 0.3–1.2)
Total Protein: 7.5 g/dL (ref 6.5–8.1)

## 2019-03-10 NOTE — Progress Notes (Signed)
HEMATOLOGY/ONCOLOGY CLINIC NOTE  Date of Service: 03/10/2019  Patient Care Team: Carol Ada, MD as PCP - General (Family Medicine) Josue Hector, MD as PCP - Cardiology (Cardiology)  CHIEF COMPLAINTS/PURPOSE OF CONSULTATION:  Spontaneous bruising  Pancytopenia  HISTORY OF PRESENTING ILLNESS:   Karen Waller is a wonderful 64 y.o. female who has been previously followed by my colleague Dr. Grace Isaac for evaluation and management of Spontaneous bruising. The pt reports that she is doing well overall.   The pt reports that she has continued to have spontaneous bruising, without raised lumps. She first noticed this in the last two years and takes Meloxicam and Amlodipine regularly, and aspirin very rarely. She also notes that she does not always bruise in response to bumping into objects.   She denies any GI bleeding, large muscle hematoma, bleeding into her joints. She notes that she did not have heavy periods in the past. She has had knee surgery and a tonsillectomy without bleeding complications.   Most recent lab results (06/17/17) of CBC w/diff and CMP is as follows: all values are WNL except for RDW at 17.0, PLT at 118k.  On review of systems, pt reports bruising easily, and denies fevers, chills, night sweats, abdominal pains, leg swelling, and any other symptoms.   On PMHx the pt reports tonsillectomy and knee surgery. On Family Hx the pt denies blood disorders, clotting disorders.  Interval History:   Karen Waller returns today for management and evaluation of easy bruisability. The patient's last visit with [place] was on 12/01/2018. The pt reports that she is doing well overall.  The pt reports that she has been having more loose/watery stools in the morning. Pt typically has between 3-4 bowel movements before 11 am. She has also had some abdominal discomfort and jitteriness. Pt was placed on short-course Prednisone twice a few months back. Once for hip pain and  once for an insect bite. She is also still bruising quite a bit but denies any bleeding concerns. She is no longer taking Meloxicam and has not had any recent Gout flares. Pt takes OTC Tylenol to treat pain and has been using Voltaren in place of Meloxicam  Pt has an appointment with her PCP on 03/12/2019 and another upcoming appointment with her Cardiologist. She has had heart palpitations for sometime but they were improved when her HTN began being managed. Pt is not currently smoking, she stopped about 30 years ago.   Lab results today (03/10/19) of CBC w/diff and CMP is as follows: all values are WNL except for WBC at 3.2K, RBC at 3.51, Hgb at 10.6, HCT at 33.4, RDW at 17.7, PLT at 85K, nRBC Rel at 1.6, Neutro Abs at 0.9K, Creatinine at 1.18, AST at 14, GFR Est Af Am at 57. 03/10/19 Ferritin is in progress 03/10/19 K/L light chains is in progress  On review of systems, pt reports loose/watery stools, abdominal discomfort, jitters, bruising and denies gum bleeds, nose bleeds, hematuria, bloody/black stools, mouth sores, itching skin, fevers, chills, skin rashes, leg swelling and any other symptoms.    MEDICAL HISTORY:  Past Medical History:  Diagnosis Date  . Cardiomyopathy    EF 30-35% diagnosed 02/2008  . Central obesity   . Gout    left knee  . HTN (hypertension)     SURGICAL HISTORY: Past Surgical History:  Procedure Laterality Date  . KNEE SURGERY    . TONSILLECTOMY      SOCIAL HISTORY: Social History  Socioeconomic History  . Marital status: Married    Spouse name: Not on file  . Number of children: 2  . Years of education: Not on file  . Highest education level: Not on file  Occupational History  . Occupation: [employer]  Tobacco Use  . Smoking status: Former Smoker    Quit date: 02/19/1984    Years since quitting: 35.0  . Smokeless tobacco: Never Used  Substance and Sexual Activity  . Alcohol use: Not Currently  . Drug use: Never  . Sexual activity: Not on file   Other Topics Concern  . Not on file  Social History Narrative  . Not on file   Social Determinants of Health   Financial Resource Strain:   . Difficulty of Paying Living Expenses: Not on file  Food Insecurity:   . Worried About Charity fundraiser in the Last Year: Not on file  . Ran Out of Food in the Last Year: Not on file  Transportation Needs:   . Lack of Transportation (Medical): Not on file  . Lack of Transportation (Non-Medical): Not on file  Physical Activity:   . Days of Exercise per Week: Not on file  . Minutes of Exercise per Session: Not on file  Stress:   . Feeling of Stress : Not on file  Social Connections:   . Frequency of Communication with Friends and Family: Not on file  . Frequency of Social Gatherings with Friends and Family: Not on file  . Attends Religious Services: Not on file  . Active Member of Clubs or Organizations: Not on file  . Attends Archivist Meetings: Not on file  . Marital Status: Not on file  Intimate Partner Violence:   . Fear of Current or Ex-Partner: Not on file  . Emotionally Abused: Not on file  . Physically Abused: Not on file  . Sexually Abused: Not on file    FAMILY HISTORY: Family History  Problem Relation Age of Onset  . Hypertension Mother   . Breast cancer Neg Hx     ALLERGIES:  is allergic to sulfa antibiotics and codeine.  MEDICATIONS:  Current Outpatient Medications  Medication Sig Dispense Refill  . amLODipine (NORVASC) 10 MG tablet TAKE 1 TABLET BY MOUTH DAILY 30 tablet 11  . camphor-menthol (SARNA) lotion Apply 1 application topically as needed for itching. 222 mL 0  . candesartan (ATACAND) 16 MG tablet TAKE 1 TABLET BY MOUTH DAILY 90 tablet 2  . carvedilol (COREG) 6.25 MG tablet TAKE 1 TABLET BY MOUTH TWICE DAILY WITH MEALS 180 tablet 3  . Cholecalciferol (VITAMIN D3) 2000 units capsule Take 2,000 Units by mouth daily.    . diclofenac sodium (VOLTAREN) 1 % GEL Apply 4 g topically 4 (four) times  daily as needed. 500 g 6  . furosemide (LASIX) 20 MG tablet Take 0.5 tablets (10 mg total) by mouth daily. 15 tablet 9  . hydrALAZINE (APRESOLINE) 25 MG tablet TAKE 1 TABLET BY MOUTH TWICE DAILY 180 tablet 3  . hydrOXYzine (ATARAX/VISTARIL) 25 MG tablet Take 1 tablet (25 mg total) by mouth every 6 (six) hours. 12 tablet 0  . meloxicam (MOBIC) 7.5 MG tablet TAKE 1 TABLET BY MOUTH TWICE DAILY AS NEEDED 60 tablet 0  . Multiple Vitamin (MULTIVITAMIN) capsule Take 1 capsule by mouth daily.      . predniSONE (DELTASONE) 20 MG tablet Take 1 tablet (20 mg total) by mouth daily. 3 tablet 0  . triamcinolone (KENALOG) 0.025 % ointment  Apply 1 application topically at bedtime as needed. 30 g 0  . triamcinolone cream (KENALOG) 0.1 % Apply 1 application topically 2 (two) times daily. 30 g 0   No current facility-administered medications for this visit.    REVIEW OF SYSTEMS:   A 10+ POINT REVIEW OF SYSTEMS WAS OBTAINED including neurology, dermatology, psychiatry, cardiac, respiratory, lymph, extremities, GI, GU, Musculoskeletal, constitutional, breasts, reproductive, HEENT.  All pertinent positives are noted in the HPI.  All others are negative.   PHYSICAL EXAMINATION:  Vitals:   03/10/19 1512  BP: (!) 150/80  Pulse: 80  Resp: 16  Temp: 97.8 F (36.6 C)  SpO2: 97%   Filed Weights   03/10/19 1512  Weight: 194 lb (88 kg)   .Body mass index is 31.31 kg/m.   GENERAL:alert, in no acute distress and comfortable SKIN: no acute rashes, no significant lesions EYES: conjunctiva are pink and non-injected, sclera anicteric OROPHARYNX: MMM, no exudates, no oropharyngeal erythema or ulceration NECK: supple, no JVD LYMPH:  no palpable lymphadenopathy in the cervical, axillary or inguinal regions LUNGS: clear to auscultation b/l with normal respiratory effort HEART: regular rate & rhythm ABDOMEN:  normoactive bowel sounds , non tender, not distended. No palpable hepatosplenomegaly.  Extremity: no pedal  edema PSYCH: alert & oriented x 3 with fluent speech NEURO: no focal motor/sensory deficits  LABORATORY DATA:  I have reviewed the data as listed  . CBC Latest Ref Rng & Units 03/10/2019 11/26/2018 11/02/2018  WBC 4.0 - 10.5 K/uL 3.2(L) 3.1(L) 3.1(L)  Hemoglobin 12.0 - 15.0 g/dL 10.6(L) 9.9(L) 10.4(L)  Hematocrit 36.0 - 46.0 % 33.4(L) 31.5(L) 32.1(L)  Platelets 150 - 400 K/uL 85(L) 80(L) 87(L)    . CMP Latest Ref Rng & Units 03/10/2019 11/26/2018 11/02/2018  Glucose 70 - 99 mg/dL 81 115(H) 147(H)  BUN 8 - 23 mg/dL 17 16 20   Creatinine 0.44 - 1.00 mg/dL 1.18(H) 0.85 1.13(H)  Sodium 135 - 145 mmol/L 142 138 142  Potassium 3.5 - 5.1 mmol/L 4.1 3.8 3.8  Chloride 98 - 111 mmol/L 110 110 109  CO2 22 - 32 mmol/L 23 19(L) 24  Calcium 8.9 - 10.3 mg/dL 8.9 8.9 9.1  Total Protein 6.5 - 8.1 g/dL 7.5 - 7.2  Total Bilirubin 0.3 - 1.2 mg/dL 0.5 - 0.4  Alkaline Phos 38 - 126 U/L 85 - 79  AST 15 - 41 U/L 14(L) - 13(L)  ALT 0 - 44 U/L 12 - 12   Component     Latest Ref Rng & Units 06/17/2017  Coagulation Factor VIII     57 - 163 % 128  Ristocetin Co-factor, Plasma     50 - 200 % 117  Von Willebrand Antigen, Plasma     50 - 200 % 126  Prothrombin Time     11.4 - 15.2 seconds 12.9  INR      0.98  PFA Interpretation        Collagen / Epinephrine     0 - 193 seconds 192  Fibrinogen     210 - 475 mg/dL 291  APTT     24 - 36 seconds 31  LDH     125 - 245 U/L 230   Component     Latest Ref Rng & Units 01/08/2018  WBC     4.0 - 10.5 K/uL 3.1 (L)  RBC     3.87 - 5.11 MIL/uL 3.71 (L)  Hemoglobin     12.0 - 15.0 g/dL 11.1 (L)  HCT  36.0 - 46.0 % 35.4 (L)  MCV     80.0 - 100.0 fL 95.4  MCH     26.0 - 34.0 pg 29.9  MCHC     30.0 - 36.0 g/dL 31.4  RDW     11.5 - 15.5 % 16.4 (H)  Platelets     150 - 400 K/uL 104 (L)  nRBC     0.0 - 0.2 % 0.0  Neutrophils     % 37  NEUT#     1.7 - 7.7 K/uL 1.2 (L)  Lymphocytes     % 51  Lymphocyte #     0.7 - 4.0 K/uL 1.6  Monocytes  Relative     % 12  Monocyte #     0.1 - 1.0 K/uL 0.4  Eosinophil     % 0  Eosinophils Absolute     0.0 - 0.5 K/uL 0.0  Basophil     % 0  Basophils Absolute     0.0 - 0.1 K/uL 0.0  Immature Granulocytes     % 0  Abs Immature Granulocytes     0.00 - 0.07 K/uL 0.00  Sodium     135 - 145 mmol/L 145  Potassium     3.5 - 5.1 mmol/L 4.0  Chloride     98 - 111 mmol/L 111  CO2     22 - 32 mmol/L 25  Glucose     70 - 99 mg/dL 117 (H)  BUN     8 - 23 mg/dL 12  Creatinine     0.44 - 1.00 mg/dL 1.10 (H)  Calcium     8.9 - 10.3 mg/dL 9.3  Total Protein     6.5 - 8.1 g/dL 7.2  Albumin     3.5 - 5.0 g/dL 3.8  AST     15 - 41 U/L 15  ALT     0 - 44 U/L 13  Alkaline Phosphatase     38 - 126 U/L 85  Total Bilirubin     0.3 - 1.2 mg/dL 0.5  GFR, Est Non African American     >60 mL/min 53 (L)  GFR, Est African American     >60 mL/min >60  Anion gap     5 - 15 9  Prothrombin Time     11.4 - 15.2 seconds 12.7  INR      0.96  APTT     24 - 36 seconds 30  Fibrinogen     210 - 475 mg/dL 272   11/26/2018 BM Flow Pathology Report    11/26/2018 BM Bx Report    11/26/2018 BM Cytogenetics:    RADIOGRAPHIC STUDIES: I have personally reviewed the radiological images as listed and agreed with the findings in the report. No results found.  ASSESSMENT & PLAN:   64 y.o. female with  1. Minor Spontaneous?/undetected trauma causing bruising No ecchymosis with bruising  Clotting parameters normal from 06/17/17 with normal PT, aPTT and VWD panel and platelet function testing 01/08/18 PT and aPTT, fibrinogen WNL  2. Thrombocytopenia Pt's PLT in 10/19/14 were normal at 215k when she was having abnormal bruising Labwork from 12/25/17; mild anemia with HGB at 11.5, ANC at 1.2k, PLT at [facility]  11/16/2018 US Abdomen ([id number]) revealed "Cholelithiasis. No evidence for acute abnormality." 11/26/2018 BM Bx [id number]) revealed "-Hypercellular bone marrow for age with trilineage  hematopoiesis and fibrosis. Pancytopenia" 11/26/2018 BM Flow Pathology Report [id number]) revealed  "-No significant blastic population identified. 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 Neutropenia ANC 1.1k  PLAN: -Discussed pt labwork today, 03/10/19; PLT are slightly increased, other blood counts are steady, blood chemistries are stable  -Discussed 03/10/19 Ferritin is in progress -Discussed 03/10/19 K/L light chains is in progress -Still no clear explanation for her increased bruising, at PLT >50k there is also not explanation of ease of bruising. 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 -Pt likely has MDS and/or MPN - would like to get a clearer picture -Will attempt to get heme foundation one on BM -Pt will continue to monitor for skin rashes and raised bruises -Recommend continuing daily multivitamin -Minimize NSAIDs -Will continue to monitor with labs and clinical visits -Will see back in 3 months with labs   FOLLOW UP: RTC with Dr. Irene Limbo in 3 months with labs   The total time spent in the appt was 30 minutes and more than 50% was on counseling and direct patient cares.  All of the patient's questions were answered with apparent satisfaction. The patient knows to call the clinic with any problems, questions or concerns.    Sullivan Lone MD [place] AAHIVMS [facility] Hematology/Oncology Physician [facility]  (Office):       [phone] (Work cell):  [phone] (Fax):           [phone]  03/10/2019 4:58 PM  I, Yevette Edwards, am acting as a scribe for Dr. Sullivan Lone.    .I have reviewed the above documentation for accuracy and completeness, and I agree with the above. Brunetta Genera MD

## 2019-03-11 ENCOUNTER — Other Ambulatory Visit: Payer: Self-pay | Admitting: Cardiovascular Disease

## 2019-03-11 ENCOUNTER — Telehealth: Payer: Self-pay | Admitting: Hematology

## 2019-03-11 LAB — KAPPA/LAMBDA LIGHT CHAINS
Kappa free light chain: 31.7 mg/L — ABNORMAL HIGH (ref 3.3–19.4)
Kappa, lambda light chain ratio: 1.57 (ref 0.26–1.65)
Lambda free light chains: 20.2 mg/L (ref 5.7–26.3)

## 2019-03-11 LAB — FERRITIN: Ferritin: 64 ng/mL (ref 11–307)

## 2019-03-11 MED ORDER — FUROSEMIDE 20 MG PO TABS: 10.0000 mg | ORAL_TABLET | Freq: Every day | ORAL | 6 refills | Status: DC

## 2019-03-11 NOTE — Telephone Encounter (Signed)
Scheduled appt per 1/20 los.  Sent a message to HIM pool to get a calendar mailed out. 

## 2019-03-23 ENCOUNTER — Other Ambulatory Visit: Payer: Self-pay | Admitting: Cardiovascular Disease

## 2019-03-23 NOTE — Progress Notes (Signed)
Patient ID: Karen Waller, female   DOB: 09-09-1955, 64 y.o.   MRN: [id number]     Karen Waller is seen today for DCM, HTN palpitations and elevated lipids.. She denies syncope, SSCP, dyspnea or edema.. Her EF had been as low as 35%. She is tolerating BB and ARB with no palpitations. Work at [employer] is [employer] well. She is seeing a dietician for initial Rx of her elevated lipids. Compliant with meds  Diuretics can cause flairs in gout especially HCTZ Atacand worked better than Cozaar.   EF 47% by MRI 01/13/13.  No scar.    TTE reviewed 07/16/18 EF 55-60% normal valves GLS -16.6  Monitor June 2020 no PAF some atrial and ventricular ectopy but less than 1% beats and did not correlate With symptoms   She is watching her salt but still overweight and sedentary. Walks with her [place] a few times/week    Some fatigue Some bruising has seen a hematologist for this May have low grade MDS Labs 03/10/19 showed increase in Kappa free light chain and pancytopenia with WBC 3.2 PLT 85 and Hct 33.4    ROS: Denies fever, malais, weight loss, blurry vision, decreased visual acuity, cough, sputum, SOB, hemoptysis, pleuritic pain, palpitaitons, heartburn, abdominal pain, melena, lower extremity edema, claudication, or rash.  All other systems reviewed and negative  General: BP 132/88   Pulse 70   Ht 5\' 6"  (1.676 m)   Wt 196 lb 12.8 oz (89.3 kg)   SpO2 98%   BMI 31.76 kg/m  Affect appropriate Healthy:  appears stated age 10: normal Neck supple with no adenopathy JVP normal no bruits no thyromegaly Lungs clear with no wheezing and good diaphragmatic motion Heart:  S1/S2 no murmur, no rub, gallop or click PMI normal Abdomen: benighn, BS positve, no tenderness, no AAA no bruit.  No HSM or HJR Distal pulses intact with no bruits No edema Neuro non-focal Skin some bruising left arm and hand  No muscular weakness     Current Outpatient Medications  Medication Sig Dispense Refill  .  amLODipine (NORVASC) 10 MG tablet Take 1 tablet (10 mg total) by mouth daily. Please keep upcoming appt for refills. Thank you 90 tablet 0  . camphor-menthol (SARNA) lotion Apply 1 application topically as needed for itching. 222 mL 0  . candesartan (ATACAND) 16 MG tablet TAKE 1 TABLET BY MOUTH DAILY 90 tablet 2  . carvedilol (COREG) 6.25 MG tablet Take 1 tablet (6.25 mg total) by mouth 2 (two) times daily with a meal. Please keep upcoming appt for refills. Thank you 180 tablet 0  . Cholecalciferol (VITAMIN D3) 2000 units capsule Take 2,000 Units by mouth daily.    . diclofenac sodium (VOLTAREN) 1 % GEL Apply 4 g topically 4 (four) times daily as needed. 500 g 6  . furosemide (LASIX) 20 MG tablet Take 0.5 tablets (10 mg total) by mouth daily. Please keep upcoming appt with Dr. Johnsie Cancel in February for future refills. Thank you 15 tablet 6  . hydrALAZINE (APRESOLINE) 25 MG tablet Take 1 tablet (25 mg total) by mouth 2 (two) times daily. Please keep upcoming appt for refills. Thank you 180 tablet 0  . hydrOXYzine (ATARAX/VISTARIL) 25 MG tablet Take 1 tablet (25 mg total) by mouth every 6 (six) hours. 12 tablet 0  . meloxicam (MOBIC) 7.5 MG tablet TAKE 1 TABLET BY MOUTH TWICE DAILY AS NEEDED 60 tablet 0  . Multiple Vitamin (MULTIVITAMIN) capsule Take 1 capsule by mouth daily.      Marland Kitchen  predniSONE (DELTASONE) 20 MG tablet Take 1 tablet (20 mg total) by mouth daily. 3 tablet 0  . triamcinolone (KENALOG) 0.025 % ointment Apply 1 application topically at bedtime as needed. 30 g 0  . triamcinolone cream (KENALOG) 0.1 % Apply 1 application topically 2 (two) times daily. 30 g 0   No current facility-administered medications for this visit.    Allergies  Sulfa antibiotics and Codeine  Electrocardiogram:   01/08/17 SR rate 65 normal 03/31/19 SR rate 70 normal   Assessment and Plan  HTN: Well controlled.  Continue current medications and low sodium Dash type diet.    Palpitations:  Continue beta blocker no  serious arrhythmia's on monitor June 2020   DCM:  Euvolemic continue current meds EF normal by TTE 07/16/18 no need for Entresto  Bruising:  F/u hematology PLT 85 03/10/19 seeing Juleen China hematology clinic possible low grade MDS   Jenkins Rouge

## 2019-03-31 ENCOUNTER — Ambulatory Visit (INDEPENDENT_AMBULATORY_CARE_PROVIDER_SITE_OTHER): Payer: 59 | Admitting: Cardiovascular Disease

## 2019-03-31 ENCOUNTER — Other Ambulatory Visit: Payer: Self-pay

## 2019-03-31 ENCOUNTER — Encounter: Payer: Self-pay | Admitting: Cardiovascular Disease

## 2019-03-31 VITALS — BP 132/88 | HR 70 | Ht 66.0 in | Wt 196.8 lb

## 2019-03-31 DIAGNOSIS — I420 Dilated cardiomyopathy: Secondary | ICD-10-CM | POA: Diagnosis not present

## 2019-03-31 NOTE — Patient Instructions (Signed)
Medication Instructions:   *If you need a refill on your cardiac medications before your next appointment, please call your pharmacy*  Lab Work:  If you have labs (blood work) drawn today and your tests are completely normal, you will receive your results only by: Marland Kitchen MyChart Message (if you have MyChart) OR . A paper copy in the mail If you have any lab test that is abnormal or we need to change your treatment, we will call you to review the results.  Follow-Up: At [facility], you and your health needs are our priority.  As part of our continuing mission to provide you with exceptional heart care, we have created designated Provider Care Teams.  These Care Teams include your primary Cardiologist (physician) and Advanced Practice Providers (APPs -  Physician Assistants and Nurse Practitioners) who all work together to provide you with the care you need, when you need it.  Your next appointment:   1 year(s)  The format for your next appointment:   In Person  Provider:   You may see Jenkins Rouge, MD or one of the following Advanced Practice Providers on your designated Care Team:    Truitt Merle, NP  Cecilie Kicks, NP  Kathyrn Drown, NP

## 2019-04-15 ENCOUNTER — Other Ambulatory Visit: Payer: Self-pay | Admitting: Cardiovascular Disease

## 2019-06-10 ENCOUNTER — Other Ambulatory Visit: Payer: Self-pay | Admitting: *Deleted

## 2019-06-10 DIAGNOSIS — D469 Myelodysplastic syndrome, unspecified: Secondary | ICD-10-CM

## 2019-06-10 DIAGNOSIS — D61818 Other pancytopenia: Secondary | ICD-10-CM

## 2019-06-14 ENCOUNTER — Inpatient Hospital Stay: Payer: 59

## 2019-06-14 ENCOUNTER — Inpatient Hospital Stay: Payer: 59 | Attending: Hematology | Admitting: Hematology

## 2019-06-14 ENCOUNTER — Other Ambulatory Visit: Payer: Self-pay

## 2019-06-14 VITALS — BP 134/58 | HR 73 | Temp 98.2°F | Resp 18 | Ht 66.0 in | Wt 196.2 lb

## 2019-06-14 DIAGNOSIS — Z87891 Personal history of nicotine dependence: Secondary | ICD-10-CM | POA: Insufficient documentation

## 2019-06-14 DIAGNOSIS — R233 Spontaneous ecchymoses: Secondary | ICD-10-CM | POA: Insufficient documentation

## 2019-06-14 DIAGNOSIS — I10 Essential (primary) hypertension: Secondary | ICD-10-CM | POA: Insufficient documentation

## 2019-06-14 DIAGNOSIS — D61818 Other pancytopenia: Secondary | ICD-10-CM

## 2019-06-14 DIAGNOSIS — D469 Myelodysplastic syndrome, unspecified: Secondary | ICD-10-CM

## 2019-06-14 DIAGNOSIS — Z79899 Other long term (current) drug therapy: Secondary | ICD-10-CM | POA: Insufficient documentation

## 2019-06-14 LAB — CBC WITH DIFFERENTIAL (CANCER CENTER ONLY)
Abs Immature Granulocytes: 0 10*3/uL (ref 0.00–0.07)
Basophils Absolute: 0 10*3/uL (ref 0.0–0.1)
Basophils Relative: 0 %
Eosinophils Absolute: 0 10*3/uL (ref 0.0–0.5)
Eosinophils Relative: 0 %
HCT: 32.4 % — ABNORMAL LOW (ref 36.0–46.0)
Hemoglobin: 10.3 g/dL — ABNORMAL LOW (ref 12.0–15.0)
Immature Granulocytes: 0 %
Lymphocytes Relative: 58 %
Lymphs Abs: 1.6 10*3/uL (ref 0.7–4.0)
MCH: 30.1 pg (ref 26.0–34.0)
MCHC: 31.8 g/dL (ref 30.0–36.0)
MCV: 94.7 fL (ref 80.0–100.0)
Monocytes Absolute: 0.3 10*3/uL (ref 0.1–1.0)
Monocytes Relative: 12 %
Neutro Abs: 0.8 10*3/uL — ABNORMAL LOW (ref 1.7–7.7)
Neutrophils Relative %: 30 %
Platelet Count: 73 10*3/uL — ABNORMAL LOW (ref 150–400)
RBC: 3.42 MIL/uL — ABNORMAL LOW (ref 3.87–5.11)
RDW: 17.5 % — ABNORMAL HIGH (ref 11.5–15.5)
WBC Count: 2.8 10*3/uL — ABNORMAL LOW (ref 4.0–10.5)
nRBC: 0.7 % — ABNORMAL HIGH (ref 0.0–0.2)

## 2019-06-14 LAB — CMP (CANCER CENTER ONLY)
ALT: 13 U/L (ref 0–44)
AST: 14 U/L — ABNORMAL LOW (ref 15–41)
Albumin: 3.8 g/dL (ref 3.5–5.0)
Alkaline Phosphatase: 77 U/L (ref 38–126)
Anion gap: 8 (ref 5–15)
BUN: 15 mg/dL (ref 8–23)
CO2: 24 mmol/L (ref 22–32)
Calcium: 9.2 mg/dL (ref 8.9–10.3)
Chloride: 111 mmol/L (ref 98–111)
Creatinine: 1.03 mg/dL — ABNORMAL HIGH (ref 0.44–1.00)
GFR, Est AFR Am: >60 mL/min (ref >60)
GFR, Estimated: 58 mL/min — ABNORMAL LOW (ref >60)
Glucose, Bld: 124 mg/dL — ABNORMAL HIGH (ref 70–99)
Potassium: 4.2 mmol/L (ref 3.5–5.1)
Sodium: 143 mmol/L (ref 135–145)
Total Bilirubin: 0.5 mg/dL (ref 0.3–1.2)
Total Protein: 7.1 g/dL (ref 6.5–8.1)

## 2019-06-14 LAB — FERRITIN: Ferritin: 54 ng/mL (ref 11–307)

## 2019-06-14 NOTE — Progress Notes (Signed)
HEMATOLOGY/ONCOLOGY CLINIC NOTE  Date of Service: 06/14/2019  Patient Care Team: Carol Ada, MD as PCP - General (Family Medicine) Josue Hector, MD as PCP - Cardiology (Cardiology)  CHIEF COMPLAINTS/PURPOSE OF CONSULTATION:  Spontaneous bruising  Pancytopenia  HISTORY OF PRESENTING ILLNESS:   Karen Waller is a wonderful 64 y.o. female who has been previously followed by my colleague Dr. Grace Isaac for evaluation and management of Spontaneous bruising. The pt reports that she is doing well overall.   The pt reports that she has continued to have spontaneous bruising, without raised lumps. She first noticed this in the last two years and takes Meloxicam and Amlodipine regularly, and aspirin very rarely. She also notes that she does not always bruise in response to bumping into objects.   She denies any GI bleeding, large muscle hematoma, bleeding into her joints. She notes that she did not have heavy periods in the past. She has had knee surgery and a tonsillectomy without bleeding complications.   Most recent lab results (06/17/17) of CBC w/diff and CMP is as follows: all values are WNL except for RDW at 17.0, PLT at 118k.  On review of systems, pt reports bruising easily, and denies fevers, chills, night sweats, abdominal pains, leg swelling, and any other symptoms.   On PMHx the pt reports tonsillectomy and knee surgery. On Family Hx the pt denies blood disorders, clotting disorders.  Interval History:   Karen Waller returns today for management and evaluation of easy bruisability. The patient's last visit with [place] was on 03/10/2019. The pt reports that she is doing well overall.  The pt reports that she does occasionally take Tylenol for her hip pain and has not taken Meloxicam since 2020. Pt was given Voltaren cream but has not started using it yet. She denies any fatigue, SOB, or any infection symptoms. Pt has been recently having pain in her left calf area.  She has also been experiencing brittle fingernails and denies any chemical exposure at work.     Lab results today (06/14/19) of CBC w/diff and CMP is as follows: all values are WNL except for WBC at 2.8K, RBC at 3.42, Hgb at 10.3, HCT at 32.4, RDW at 17.5, PLT at 73K, nRC Rel at 0.7, Neutro Abs at 0.8K, Glucose at 124, Creatinine at 1.03, AST at 14. 06/14/2019 Ferritin at 54 06/14/2019 K/L light chains are in progress  On review of systems, pt reports LLE pain, bruising and denies SOB, fatigue, fevers, chills, night sweats, abdominal pain, left calf swelling and any other symptoms.    MEDICAL HISTORY:  Past Medical History:  Diagnosis Date  . Cardiomyopathy    EF 30-35% diagnosed 02/2008  . Central obesity   . Gout    left knee  . HTN (hypertension)     SURGICAL HISTORY: Past Surgical History:  Procedure Laterality Date  . KNEE SURGERY    . TONSILLECTOMY      SOCIAL HISTORY: Social History   Socioeconomic History  . Marital status: Married    Spouse name: Not on file  . Number of children: 2  . Years of education: Not on file  . Highest education level: Not on file  Occupational History  . Occupation: [employer]  Tobacco Use  . Smoking status: Former Smoker    Quit date: 02/19/1984    Years since quitting: 35.3  . Smokeless tobacco: Never Used  Substance and Sexual Activity  . Alcohol use: Not Currently  . Drug  use: Never  . Sexual activity: Not on file  Other Topics Concern  . Not on file  Social History Narrative  . Not on file   Social Determinants of Health   Financial Resource Strain:   . Difficulty of Paying Living Expenses:   Food Insecurity:   . Worried About Charity fundraiser in the Last Year:   . Arboriculturist in the Last Year:   Transportation Needs:   . Film/video editor (Medical):   Marland Kitchen Lack of Transportation (Non-Medical):   Physical Activity:   . Days of Exercise per Week:   . Minutes of Exercise per Session:   Stress:   . Feeling  of Stress :   Social Connections:   . Frequency of Communication with Friends and Family:   . Frequency of Social Gatherings with Friends and Family:   . Attends Religious Services:   . Active Member of Clubs or Organizations:   . Attends Archivist Meetings:   Marland Kitchen Marital Status:   Intimate Partner Violence:   . Fear of Current or Ex-Partner:   . Emotionally Abused:   Marland Kitchen Physically Abused:   . Sexually Abused:     FAMILY HISTORY: Family History  Problem Relation Age of Onset  . Hypertension Mother   . Breast cancer Neg Hx     ALLERGIES:  is allergic to sulfa antibiotics and codeine.  MEDICATIONS:  Current Outpatient Medications  Medication Sig Dispense Refill  . amLODipine (NORVASC) 10 MG tablet Take 1 tablet (10 mg total) by mouth daily. Please keep upcoming appt for refills. Thank you 90 tablet 0  . camphor-menthol (SARNA) lotion Apply 1 application topically as needed for itching. 222 mL 0  . candesartan (ATACAND) 16 MG tablet TAKE 1 TABLET BY MOUTH DAILY 90 tablet 3  . carvedilol (COREG) 6.25 MG tablet Take 1 tablet (6.25 mg total) by mouth 2 (two) times daily with a meal. Please keep upcoming appt for refills. Thank you 180 tablet 0  . Cholecalciferol (VITAMIN D3) 2000 units capsule Take 2,000 Units by mouth daily.    . diclofenac sodium (VOLTAREN) 1 % GEL Apply 4 g topically 4 (four) times daily as needed. 500 g 6  . furosemide (LASIX) 20 MG tablet TAKE 1/2 TABLET BY MOUTH DAILY 45 tablet 3  . hydrALAZINE (APRESOLINE) 25 MG tablet Take 1 tablet (25 mg total) by mouth 2 (two) times daily. Please keep upcoming appt for refills. Thank you 180 tablet 0  . hydrOXYzine (ATARAX/VISTARIL) 25 MG tablet Take 1 tablet (25 mg total) by mouth every 6 (six) hours. 12 tablet 0  . Multiple Vitamin (MULTIVITAMIN) capsule Take 1 capsule by mouth daily.      Marland Kitchen triamcinolone cream (KENALOG) 0.1 % Apply 1 application topically 2 (two) times daily. 30 g 0   No current  facility-administered medications for this visit.    REVIEW OF SYSTEMS:   A 10+ POINT REVIEW OF SYSTEMS WAS OBTAINED including neurology, dermatology, psychiatry, cardiac, respiratory, lymph, extremities, GI, GU, Musculoskeletal, constitutional, breasts, reproductive, HEENT.  All pertinent positives are noted in the HPI.  All others are negative.   PHYSICAL EXAMINATION:  Vitals:   06/14/19 1412  BP: (!) 134/58  Pulse: 73  Resp: 18  Temp: 98.2 F (36.8 C)  SpO2: 98%   Filed Weights   06/14/19 1412  Weight: 196 lb 3.2 oz (89 kg)   .Body mass index is 31.67 kg/m.   GENERAL:alert, in no acute distress and  comfortable SKIN: no acute rashes, no significant lesions EYES: conjunctiva are pink and non-injected, sclera anicteric OROPHARYNX: MMM, no exudates, no oropharyngeal erythema or ulceration NECK: supple, no JVD LYMPH:  no palpable lymphadenopathy in the cervical, axillary or inguinal regions LUNGS: clear to auscultation b/l with normal respiratory effort HEART: regular rate & rhythm ABDOMEN:  normoactive bowel sounds , non tender, not distended. No palpable hepatosplenomegaly.  Extremity: no pedal edema PSYCH: alert & oriented x 3 with fluent speech NEURO: no focal motor/sensory deficits  LABORATORY DATA:  I have reviewed the data as listed  . CBC Latest Ref Rng & Units 06/14/2019 03/10/2019 11/26/2018  WBC 4.0 - 10.5 K/uL 2.8(L) 3.2(L) 3.1(L)  Hemoglobin 12.0 - 15.0 g/dL 10.3(L) 10.6(L) 9.9(L)  Hematocrit 36.0 - 46.0 % 32.4(L) 33.4(L) 31.5(L)  Platelets 150 - 400 K/uL 73(L) 85(L) 80(L)    . CMP Latest Ref Rng & Units 06/14/2019 03/10/2019 11/26/2018  Glucose 70 - 99 mg/dL 124(H) 81 115(H)  BUN 8 - 23 mg/dL 15 17 16   Creatinine 0.44 - 1.00 mg/dL 1.03(H) 1.18(H) 0.85  Sodium 135 - 145 mmol/L 143 142 138  Potassium 3.5 - 5.1 mmol/L 4.2 4.1 3.8  Chloride 98 - 111 mmol/L 111 110 110  CO2 22 - 32 mmol/L 24 23 19(L)  Calcium 8.9 - 10.3 mg/dL 9.2 8.9 8.9  Total Protein 6.5 -  8.1 g/dL 7.1 7.5 -  Total Bilirubin 0.3 - 1.2 mg/dL 0.5 0.5 -  Alkaline Phos 38 - 126 U/L 77 85 -  AST 15 - 41 U/L 14(L) 14(L) -  ALT 0 - 44 U/L 13 12 -   Component     Latest Ref Rng & Units 06/17/2017  Coagulation Factor VIII     57 - 163 % 128  Ristocetin Co-factor, Plasma     50 - 200 % 117  Von Willebrand Antigen, Plasma     50 - 200 % 126  Prothrombin Time     11.4 - 15.2 seconds 12.9  INR      0.98  PFA Interpretation        Collagen / Epinephrine     0 - 193 seconds 192  Fibrinogen     210 - 475 mg/dL 291  APTT     24 - 36 seconds 31  LDH     125 - 245 U/L 230   Component     Latest Ref Rng & Units 01/08/2018  WBC     4.0 - 10.5 K/uL 3.1 (L)  RBC     3.87 - 5.11 MIL/uL 3.71 (L)  Hemoglobin     12.0 - 15.0 g/dL 11.1 (L)  HCT     36.0 - 46.0 % 35.4 (L)  MCV     80.0 - 100.0 fL 95.4  MCH     26.0 - 34.0 pg 29.9  MCHC     30.0 - 36.0 g/dL 31.4  RDW     11.5 - 15.5 % 16.4 (H)  Platelets     150 - 400 K/uL 104 (L)  nRBC     0.0 - 0.2 % 0.0  Neutrophils     % 37  NEUT#     1.7 - 7.7 K/uL 1.2 (L)  Lymphocytes     % 51  Lymphocyte #     0.7 - 4.0 K/uL 1.6  Monocytes Relative     % 12  Monocyte #     0.1 - 1.0 K/uL 0.4  Eosinophil     %  0  Eosinophils Absolute     0.0 - 0.5 K/uL 0.0  Basophil     % 0  Basophils Absolute     0.0 - 0.1 K/uL 0.0  Immature Granulocytes     % 0  Abs Immature Granulocytes     0.00 - 0.07 K/uL 0.00  Sodium     135 - 145 mmol/L 145  Potassium     3.5 - 5.1 mmol/L 4.0  Chloride     98 - 111 mmol/L 111  CO2     22 - 32 mmol/L 25  Glucose     70 - 99 mg/dL 117 (H)  BUN     8 - 23 mg/dL 12  Creatinine     0.44 - 1.00 mg/dL 1.10 (H)  Calcium     8.9 - 10.3 mg/dL 9.3  Total Protein     6.5 - 8.1 g/dL 7.2  Albumin     3.5 - 5.0 g/dL 3.8  AST     15 - 41 U/L 15  ALT     0 - 44 U/L 13  Alkaline Phosphatase     38 - 126 U/L 85  Total Bilirubin     0.3 - 1.2 mg/dL 0.5  GFR, Est Non African American      >60 mL/min 53 (L)  GFR, Est African American     >60 mL/min >60  Anion gap     5 - 15 9  Prothrombin Time     11.4 - 15.2 seconds 12.7  INR      0.96  APTT     24 - 36 seconds 30  Fibrinogen     210 - 475 mg/dL 272   11/26/2018 BM Flow Pathology Report    11/26/2018 BM Bx Report    11/26/2018 BM Cytogenetics:    RADIOGRAPHIC STUDIES: I have personally reviewed the radiological images as listed and agreed with the findings in the report. No results found.  ASSESSMENT & PLAN:   64 y.o. female with  1. Minor Spontaneous?/undetected trauma causing bruising No ecchymosis with bruising  Clotting parameters normal from 06/17/17 with normal PT, aPTT and VWD panel and platelet function testing 01/08/18 PT and aPTT, fibrinogen WNL  2. Thrombocytopenia Pt's PLT in 10/19/14 were normal at 215k when she was having abnormal bruising Labwork from 12/25/17; mild anemia with HGB at 11.5, ANC at 1.2k, PLT at [facility]  11/16/2018 US Abdomen ([id number]) revealed "Cholelithiasis. No evidence for acute abnormality." 11/26/2018 BM Bx [id number]) revealed "-Hypercellular bone marrow for age with trilineage hematopoiesis and fibrosis. Pancytopenia" 11/26/2018 BM Flow Pathology Report [id number]) revealed  "-No significant blastic population identified. 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 Neutropenia ANC 1.1k  PLAN: -Discussed pt labwork today, 06/14/19; Hgb is stable, WBC and PLT are low, blood chemistries are stable, Ferritin is WNL at 54 -Discussed 06/14/2019 K/L light chains are in progress -Still no clear explanation for her increased bruising as PLT >50K. -No overt evidence of significant bleeding disorder clinically or based on screening labs -Due to continued anemia would recommend more aggressive iron replacement - would prefer to replace IV -Advised pt of the  possibility of an allergic reaction caused by IV Iron  -Recommend pt use warm compress for LLE discomfort, which appears muscular in nature -Recommend pt avoid OTC pain medication, as it could cause a drop in blood counts  -Recommend  pt take OTC B-complex vitamin -Will give IV Injectafer in 1-2 weeks   -Will see back in 4 months with labs    FOLLOW UP: IV Injectafer in 1-2 weeks x 1 dose RTC with Dr Irene Limbo with labs in 4 months   The total time spent in the appt was 20 minutes and more than 50% was on counseling and direct patient cares.  All of the patient's questions were answered with apparent satisfaction. The patient knows to call the clinic with any problems, questions or concerns.    Sullivan Lone MD [place] AAHIVMS [facility] Hematology/Oncology Physician [facility]  (Office):       [phone] (Work cell):  [phone] (Fax):           [phone]  06/14/2019 4:17 PM  I, Yevette Edwards, am acting as a scribe for Dr. Sullivan Lone.   .I have reviewed the above documentation for accuracy and completeness, and I agree with the above. Brunetta Genera MD

## 2019-06-15 LAB — KAPPA/LAMBDA LIGHT CHAINS
Kappa free light chain: 28.8 mg/L — ABNORMAL HIGH (ref 3.3–19.4)
Kappa, lambda light chain ratio: 1.58 (ref 0.26–1.65)
Lambda free light chains: 18.2 mg/L (ref 5.7–26.3)

## 2019-06-17 ENCOUNTER — Other Ambulatory Visit: Payer: Self-pay

## 2019-06-17 ENCOUNTER — Ambulatory Visit (INDEPENDENT_AMBULATORY_CARE_PROVIDER_SITE_OTHER): Payer: 59 | Admitting: Physician Assistant

## 2019-06-17 ENCOUNTER — Ambulatory Visit: Payer: Self-pay

## 2019-06-17 ENCOUNTER — Encounter: Payer: Self-pay | Admitting: Orthopedic Surgery

## 2019-06-17 VITALS — Ht 66.0 in | Wt 196.0 lb

## 2019-06-17 DIAGNOSIS — M25562 Pain in left knee: Secondary | ICD-10-CM | POA: Diagnosis not present

## 2019-06-17 NOTE — Progress Notes (Signed)
Office Visit Note   Patient: Karen Waller           Date of Birth: 02-12-1956           MRN: [id number] Visit Date: 06/17/2019              Requested by: Carol Ada, MD [place],  [place] [id number] PCP: Carol Ada, MD  Chief Complaint  Patient presents with  . Left Knee - Pain      HPI: This is a pleasant 64 year old woman with a 4-day history of left lateral knee pain.  She denies any injuries but states that she did used to play a lot of basketball.  She worked all day and noticed pain on the lateral side of her knee associated with a little bit of swelling.  She thought this might be gout which she had years ago but when she went to bed that night her symptoms significantly decreased.  Since then they have come and gone but the for the most part including today she really does not have any pain she denies any instability except earlier in the week when she had pain.  Assessment & Plan: Visit Diagnoses:  1. Acute pain of left knee     Plan: She does have some mechanical symptoms and some early degenerative changes on x-ray.  I cannot completely rule out a lateral chondrocalcinosis.  I have offered her an injection today or for her mechanical symptoms if she wants to go to an MRI I think that would be reasonable.  She really does not want to do anything right now as she is asymptomatic.  I did recommend close chain quadriceps strengthening which I demonstrated to her as well as Voltaren gel she understands she may follow-up at any time for an injection  Follow-Up Instructions: No follow-ups on file.   Ortho Exam  Patient is alert, oriented, no adenopathy, well-dressed, normal affect, normal respiratory effort. Left knee: No effusion no cellulitis.  She is focally tender on the lateral joint line.  Some increased pain with terminal extension and flexion.  No instability.  Very little motion of the patellofemoral joint although she is not tender  here  Imaging: No results found. No images are attached to the encounter.  Labs: Lab Results  Component Value Date   ESRSEDRATE 26 (H) 11/02/2018   ESRSEDRATE 27 (H) 10/10/2014     Lab Results  Component Value Date   ALBUMIN 3.8 06/14/2019   ALBUMIN 4.1 03/10/2019   ALBUMIN 4.0 11/02/2018    Lab Results  Component Value Date   MG 2.1 06/06/2008   No results found for: VD25OH  No results found for: PREALBUMIN CBC EXTENDED Latest Ref Rng & Units 06/14/2019 03/10/2019 11/26/2018  WBC 4.0 - 10.5 K/uL 2.8(L) 3.2(L) 3.1(L)  RBC 3.87 - 5.11 MIL/uL 3.42(L) 3.51(L) 3.24(L)  HGB 12.0 - 15.0 g/dL 10.3(L) 10.6(L) 9.9(L)  HCT 36.0 - 46.0 % 32.4(L) 33.4(L) 31.5(L)  PLT 150 - 400 K/uL 73(L) 85(L) 80(L)  NEUTROABS 1.7 - 7.7 K/uL 0.8(L) 0.9(L) 0.9(L)  LYMPHSABS 0.7 - 4.0 K/uL 1.6 1.8 1.8     Body mass index is 31.64 kg/m.  Orders:  Orders Placed This Encounter  Procedures  . XR Knee 1-2 Views Left   No orders of the defined types were placed in this encounter.    Procedures: No procedures performed  Clinical Data: No additional findings.  ROS:  All other systems negative, except as  noted in the HPI. Review of Systems  Objective: Vital Signs: Ht 5\' 6"  (1.676 m)   Wt 196 lb (88.9 kg)   BMI 31.64 kg/m   Specialty Comments:  No specialty comments available.  PMFS History: Patient Active Problem List   Diagnosis Date Noted  . Pain in left hip 03/24/2018  . Bruising, spontaneous 07/06/2017  . Left Achilles tendinitis 11/08/2016  . Conductive hearing loss, bilateral 08/08/2016  . Elevated lipids 12/16/2012  . HTN (hypertension) 01/05/2011  . CHF (congestive heart failure) ([place]) 10/30/2010  . Edema 10/30/2010  . Gouty arthropathy 03/17/2008  . OVERWEIGHT 03/17/2008  . ESSENTIAL HYPERTENSION, BENIGN 03/17/2008  . Non-ischemic cardiomyopathy ([place]) 03/17/2008  . PALPITATIONS, OCCASIONAL 03/17/2008   Past Medical History:  Diagnosis Date  . Cardiomyopathy     EF 30-35% diagnosed 02/2008  . Central obesity   . Gout    left knee  . HTN (hypertension)     Family History  Problem Relation Age of Onset  . Hypertension Mother   . Breast cancer Neg Hx     Past Surgical History:  Procedure Laterality Date  . KNEE SURGERY    . TONSILLECTOMY     Social History   Occupational History  . Occupation: [employer]  Tobacco Use  . Smoking status: Former Smoker    Quit date: 02/19/1984    Years since quitting: 35.3  . Smokeless tobacco: Never Used  Substance and Sexual Activity  . Alcohol use: Not Currently  . Drug use: Never  . Sexual activity: Not on file

## 2019-06-21 ENCOUNTER — Other Ambulatory Visit: Payer: Self-pay | Admitting: Cardiovascular Disease

## 2019-06-21 ENCOUNTER — Inpatient Hospital Stay: Payer: 59

## 2019-06-22 ENCOUNTER — Other Ambulatory Visit: Payer: Self-pay | Admitting: Hematology

## 2019-06-22 DIAGNOSIS — D509 Iron deficiency anemia, unspecified: Secondary | ICD-10-CM

## 2019-06-23 ENCOUNTER — Inpatient Hospital Stay: Payer: 59 | Attending: Hematology

## 2019-06-23 ENCOUNTER — Other Ambulatory Visit: Payer: Self-pay

## 2019-06-23 VITALS — BP 123/73 | HR 69 | Temp 97.9°F | Resp 18

## 2019-06-23 DIAGNOSIS — D61818 Other pancytopenia: Secondary | ICD-10-CM | POA: Insufficient documentation

## 2019-06-23 DIAGNOSIS — D509 Iron deficiency anemia, unspecified: Secondary | ICD-10-CM

## 2019-06-23 MED ORDER — SODIUM CHLORIDE 0.9 % IV SOLN
Freq: Once | INTRAVENOUS | Status: AC
  Filled 2019-06-23: qty 250

## 2019-06-23 MED ORDER — LORATADINE 10 MG PO TABS
ORAL_TABLET | ORAL | Status: AC
  Filled 2019-06-23: qty 1

## 2019-06-23 MED ORDER — ACETAMINOPHEN 325 MG PO TABS
ORAL_TABLET | ORAL | Status: AC
  Filled 2019-06-23: qty 2

## 2019-06-23 MED ORDER — ACETAMINOPHEN 325 MG PO TABS
650.0000 mg | ORAL_TABLET | Freq: Once | ORAL | Status: AC
  Administered 2019-06-23: 650 mg via ORAL

## 2019-06-23 MED ORDER — SODIUM CHLORIDE 0.9 % IV SOLN
200.0000 mg | Freq: Once | INTRAVENOUS | Status: AC
  Administered 2019-06-23: 200 mg via INTRAVENOUS
  Filled 2019-06-23: qty 10

## 2019-06-23 MED ORDER — LORATADINE 10 MG PO TABS
10.0000 mg | ORAL_TABLET | Freq: Once | ORAL | Status: AC
  Administered 2019-06-23: 10 mg via ORAL

## 2019-06-23 NOTE — Patient Instructions (Signed)

## 2019-06-24 ENCOUNTER — Telehealth: Payer: Self-pay | Admitting: Hematology

## 2019-06-24 NOTE — Telephone Encounter (Signed)
Called patient regarding upcoming appointments, patient will be notified per My chart.

## 2019-09-14 ENCOUNTER — Other Ambulatory Visit: Payer: Self-pay | Admitting: Family Medicine

## 2019-09-14 ENCOUNTER — Other Ambulatory Visit (HOSPITAL_COMMUNITY)
Admission: RE | Admit: 2019-09-14 | Discharge: 2019-09-14 | Disposition: A | Discharge status: Home / Self Care | Payer: 59 | Source: Ambulatory Visit | Attending: Family Medicine | Admitting: Family Medicine

## 2019-09-14 DIAGNOSIS — Z124 Encounter for screening for malignant neoplasm of cervix: Secondary | ICD-10-CM | POA: Insufficient documentation

## 2019-09-16 LAB — CYTOLOGY - PAP
Comment: NEGATIVE
Diagnosis: NEGATIVE
High risk HPV: NEGATIVE

## 2019-10-13 ENCOUNTER — Inpatient Hospital Stay: Payer: 59

## 2019-10-13 ENCOUNTER — Inpatient Hospital Stay: Payer: 59 | Admitting: Hematology

## 2019-10-15 ENCOUNTER — Encounter: Payer: Self-pay | Admitting: Physician Assistant

## 2019-10-15 ENCOUNTER — Ambulatory Visit (INDEPENDENT_AMBULATORY_CARE_PROVIDER_SITE_OTHER): Payer: 59 | Admitting: Physician Assistant

## 2019-10-15 VITALS — Ht 66.0 in | Wt 196.0 lb

## 2019-10-15 DIAGNOSIS — M25511 Pain in right shoulder: Secondary | ICD-10-CM | POA: Diagnosis not present

## 2019-10-15 MED ORDER — METHYLPREDNISOLONE ACETATE 40 MG/ML IJ SUSP
40.0000 mg | INTRAMUSCULAR | Status: AC | PRN
  Administered 2019-10-15: 40 mg via INTRA_ARTICULAR

## 2019-10-15 MED ORDER — LIDOCAINE HCL 1 % IJ SOLN
5.0000 mL | INTRAMUSCULAR | Status: AC | PRN
  Administered 2019-10-15: 5 mL

## 2019-10-15 NOTE — Progress Notes (Signed)
Office Visit Note   Patient: Karen Waller           Date of Birth: [birth year]           MRN: [id number] Visit Date: 10/15/2019              Requested by: Carol Ada, MD [place],  [place] [address] PCP: Carol Ada, MD  Chief Complaint  Patient presents with  . Right Shoulder - Pain      HPI: Patient comes in today with a chief complaint of right shoulder pain. She does deny any injury but has been doing some repetitive work and lifting. Denies any paresthesias running down her arm. She was seen evaluated last night at an urgent Ortho she was given some Duexis. She does have low platelets so she is concerned about staying on an anti-inflammatory  Assessment & Plan: Visit Diagnoses: No diagnosis found.  Plan: Patient will follow up in 3 weeks.  Follow-Up Instructions: No follow-ups on file.   Ortho Exam  Patient is alert, oriented, no adenopathy, well-dressed, normal affect, normal respiratory effort. Focused examination of her right shoulder demonstrates limited internal rotation behind her back. She has full forward flexion and some pain with resisted abduction. No evidence of a frozen shoulder. No cellulitis.   Imaging: No results found. No images are attached to the encounter.  Labs: Lab Results  Component Value Date   ESRSEDRATE 26 (H) 11/02/2018   ESRSEDRATE 27 (H) 10/10/2014     Lab Results  Component Value Date   ALBUMIN 3.8 06/14/2019   ALBUMIN 4.1 03/10/2019   ALBUMIN 4.0 11/02/2018    Lab Results  Component Value Date   MG 2.1 06/06/2008   No results found for: VD25OH  No results found for: PREALBUMIN CBC EXTENDED Latest Ref Rng & Units 06/14/2019 03/10/2019 11/26/2018  WBC 4.0 - 10.5 K/uL 2.8(L) 3.2(L) 3.1(L)  RBC 3.87 - 5.11 MIL/uL 3.42(L) 3.51(L) 3.24(L)  HGB 12.0 - 15.0 g/dL 10.3(L) 10.6(L) 9.9(L)  HCT 36 - 46 % 32.4(L) 33.4(L) 31.5(L)  PLT 150 - 400 K/uL 73(L) 85(L) 80(L)  NEUTROABS 1.7 - 7.7 K/uL  0.8(L) 0.9(L) 0.9(L)  LYMPHSABS 0.7 - 4.0 K/uL 1.6 1.8 1.8     Body mass index is 31.64 kg/m.  Orders:  No orders of the defined types were placed in this encounter.  No orders of the defined types were placed in this encounter.    Procedures: Large Joint Inj: R subacromial bursa on 10/15/2019 3:08 PM Indications: diagnostic evaluation and pain Details: 22 G 1.5 in needle, posterior approach  Arthrogram: No  Medications: 5 mL lidocaine 1 %; 40 mg methylPREDNISolone acetate 40 MG/ML Outcome: tolerated well, no immediate complications Procedure, treatment alternatives, risks and benefits explained, specific risks discussed. Consent was given by the patient.      Clinical Data: No additional findings.  ROS:  All other systems negative, except as noted in the HPI. Review of Systems  Objective: Vital Signs: Ht 5\' 6"  (1.676 m)   Wt 196 lb (88.9 kg)   BMI 31.64 kg/m   Specialty Comments:  No specialty comments available.  PMFS History: Patient Active Problem List   Diagnosis Date Noted  . Iron deficiency anemia 06/22/2019  . Pain in left hip 03/24/2018  . Bruising, spontaneous 07/06/2017  . Left Achilles tendinitis 11/08/2016  . Conductive hearing loss, bilateral 08/08/2016  . Elevated lipids 12/16/2012  . HTN (hypertension) 01/05/2011  . CHF (congestive heart  failure) ([place]) 10/30/2010  . Edema 10/30/2010  . Gouty arthropathy 03/17/2008  . OVERWEIGHT 03/17/2008  . ESSENTIAL HYPERTENSION, BENIGN 03/17/2008  . Non-ischemic cardiomyopathy ([place]) 03/17/2008  . PALPITATIONS, OCCASIONAL 03/17/2008   Past Medical History:  Diagnosis Date  . Cardiomyopathy    EF 30-35% diagnosed 02/2008  . Central obesity   . Gout    left knee  . HTN (hypertension)     Family History  Problem Relation Age of Onset  . Hypertension Mother   . Breast cancer Neg Hx     Past Surgical History:  Procedure Laterality Date  . KNEE SURGERY    . TONSILLECTOMY     Social History    Occupational History  . Occupation: [employer]  Tobacco Use  . Smoking status: Former Smoker    Quit date: 02/19/1984    Years since quitting: 35.6  . Smokeless tobacco: Never Used  Vaping Use  . Vaping Use: Never used  Substance and Sexual Activity  . Alcohol use: Not Currently  . Drug use: Never  . Sexual activity: Not on file

## 2019-10-18 ENCOUNTER — Inpatient Hospital Stay (HOSPITAL_BASED_OUTPATIENT_CLINIC_OR_DEPARTMENT_OTHER): Payer: 59 | Admitting: Hematology

## 2019-10-18 ENCOUNTER — Telehealth: Payer: Self-pay | Admitting: Physician Assistant

## 2019-10-18 ENCOUNTER — Other Ambulatory Visit: Payer: Self-pay

## 2019-10-18 ENCOUNTER — Inpatient Hospital Stay: Payer: 59 | Attending: Hematology

## 2019-10-18 VITALS — BP 146/84 | HR 73 | Temp 97.6°F | Resp 18 | Ht 66.0 in | Wt 190.3 lb

## 2019-10-18 DIAGNOSIS — D61818 Other pancytopenia: Secondary | ICD-10-CM

## 2019-10-18 DIAGNOSIS — R233 Spontaneous ecchymoses: Secondary | ICD-10-CM

## 2019-10-18 DIAGNOSIS — D469 Myelodysplastic syndrome, unspecified: Secondary | ICD-10-CM

## 2019-10-18 DIAGNOSIS — R238 Other skin changes: Secondary | ICD-10-CM

## 2019-10-18 DIAGNOSIS — Z87891 Personal history of nicotine dependence: Secondary | ICD-10-CM | POA: Diagnosis not present

## 2019-10-18 DIAGNOSIS — I10 Essential (primary) hypertension: Secondary | ICD-10-CM | POA: Diagnosis not present

## 2019-10-18 DIAGNOSIS — Z79899 Other long term (current) drug therapy: Secondary | ICD-10-CM | POA: Diagnosis not present

## 2019-10-18 LAB — CMP (CANCER CENTER ONLY)
ALT: 22 U/L (ref 0–44)
AST: 18 U/L (ref 15–41)
Albumin: 3.9 g/dL (ref 3.5–5.0)
Alkaline Phosphatase: 79 U/L (ref 38–126)
Anion gap: 8 (ref 5–15)
BUN: 26 mg/dL — ABNORMAL HIGH (ref 8–23)
CO2: 24 mmol/L (ref 22–32)
Calcium: 10 mg/dL (ref 8.9–10.3)
Chloride: 106 mmol/L (ref 98–111)
Creatinine: 1 mg/dL (ref 0.44–1.00)
GFR, Est AFR Am: >60 mL/min (ref >60)
GFR, Estimated: 60 mL/min — ABNORMAL LOW (ref >60)
Glucose, Bld: 89 mg/dL (ref 70–99)
Potassium: 4 mmol/L (ref 3.5–5.1)
Sodium: 138 mmol/L (ref 135–145)
Total Bilirubin: 0.4 mg/dL (ref 0.3–1.2)
Total Protein: 7.6 g/dL (ref 6.5–8.1)

## 2019-10-18 LAB — CBC WITH DIFFERENTIAL/PLATELET
Abs Immature Granulocytes: 0.01 10*3/uL (ref 0.00–0.07)
Basophils Absolute: 0 10*3/uL (ref 0.0–0.1)
Basophils Relative: 0 %
Eosinophils Absolute: 0 10*3/uL (ref 0.0–0.5)
Eosinophils Relative: 0 %
HCT: 34.5 % — ABNORMAL LOW (ref 36.0–46.0)
Hemoglobin: 11 g/dL — ABNORMAL LOW (ref 12.0–15.0)
Immature Granulocytes: 0 %
Lymphocytes Relative: 51 %
Lymphs Abs: 1.7 10*3/uL (ref 0.7–4.0)
MCH: 30.6 pg (ref 26.0–34.0)
MCHC: 31.9 g/dL (ref 30.0–36.0)
MCV: 96.1 fL (ref 80.0–100.0)
Monocytes Absolute: 0.5 10*3/uL (ref 0.1–1.0)
Monocytes Relative: 14 %
Neutro Abs: 1.2 10*3/uL — ABNORMAL LOW (ref 1.7–7.7)
Neutrophils Relative %: 35 %
Platelets: 96 10*3/uL — ABNORMAL LOW (ref 150–400)
RBC: 3.59 MIL/uL — ABNORMAL LOW (ref 3.87–5.11)
RDW: 16.8 % — ABNORMAL HIGH (ref 11.5–15.5)
WBC: 3.4 10*3/uL — ABNORMAL LOW (ref 4.0–10.5)
nRBC: 0.9 % — ABNORMAL HIGH (ref 0.0–0.2)

## 2019-10-18 LAB — VITAMIN B12: Vitamin B-12: 3334 pg/mL — ABNORMAL HIGH (ref 180–914)

## 2019-10-18 LAB — FERRITIN: Ferritin: 106 ng/mL (ref 11–307)

## 2019-10-18 NOTE — Telephone Encounter (Signed)
I called and sw pt she is s/p an injection in her shoulder Friday and said that she feels some improvement but not % and wants to know if she should give it more time. I advised that she should give it a few more days to receive that maximum benefit and if need be we can always move her appt up. Will call and advise on wednesday.

## 2019-10-18 NOTE — Telephone Encounter (Signed)
Patient called asking PA Persons give her a call. Patient stated she wanted to know if she need a sooner appt. Please call patient at [phone].

## 2019-10-18 NOTE — Progress Notes (Signed)
HEMATOLOGY/ONCOLOGY CLINIC NOTE  Date of Service: 10/18/2019  Patient Care Team: Carol Ada, MD as PCP - General (Family Medicine) Josue Hector, MD as PCP - Cardiology (Cardiology)  CHIEF COMPLAINTS/PURPOSE OF CONSULTATION:  Spontaneous bruising  Pancytopenia  HISTORY OF PRESENTING ILLNESS:   Karen Waller is a wonderful 64 y.o. female who has been previously followed by my colleague Dr. Grace Isaac for evaluation and management of Spontaneous bruising. The pt reports that she is doing well overall.   The pt reports that she has continued to have spontaneous bruising, without raised lumps. She first noticed this in the last two years and takes Meloxicam and Amlodipine regularly, and aspirin very rarely. She also notes that she does not always bruise in response to bumping into objects.   She denies any GI bleeding, large muscle hematoma, bleeding into her joints. She notes that she did not have heavy periods in the past. She has had knee surgery and a tonsillectomy without bleeding complications.   Most recent lab results (06/17/17) of CBC w/diff and CMP is as follows: all values are WNL except for RDW at 17.0, PLT at 118k.  On review of systems, pt reports bruising easily, and denies fevers, chills, night sweats, abdominal pains, leg swelling, and any other symptoms.   On PMHx the pt reports tonsillectomy and knee surgery. On Family Hx the pt denies blood disorders, clotting disorders.  Interval History:   Karen Waller returns today for management and evaluation of easy bruisability and pancytopenia. The patient's last visit with [place] was on 06/14/2019. The pt reports that she is doing well overall.  The pt reports that she had no issues tolerating IV Iron. She has noticed an increase in energy since receiving the IV Iron and taking a daily B-complex vitamin.   Pt hurt her right shoulder and had to take 800 mg Ibuprofen to help with the pain. Pt has noticed  increased bruising since using Ibuprofen. She was given a Cortisone injection in her shoulder on Friday. The pain continues to improve.   Lab results today (10/18/19) of CBC w/diff and CMP is as follows: all values are WNL except for WBC at 3.4, RBC at 3.59, Hgb at 11.0, HCT at 34.5, RDW at 16.8, PLT at 96K, nRBC at 0.9, Neutro Abs at 1.2K, BUN at 26. 10/18/2019 Ferritin at 106 10/18/2019 Vitamin B12 is in progress 10/18/2019 K/L light chains are in progress  On review of systems, pt reports bruising, right shoulder pain and denies nose bleeds, gum bleeds, bloody/black stools, fevers, chills, night sweats, unexpected weight loss, new/worsening fatigue and any other symptoms.   MEDICAL HISTORY:  Past Medical History:  Diagnosis Date  . Cardiomyopathy    EF 30-35% diagnosed 02/2008  . Central obesity   . Gout    left knee  . HTN (hypertension)     SURGICAL HISTORY: Past Surgical History:  Procedure Laterality Date  . KNEE SURGERY    . TONSILLECTOMY      SOCIAL HISTORY: Social History   Socioeconomic History  . Marital status: Married    Spouse name: Not on file  . Number of children: 2  . Years of education: Not on file  . Highest education level: Not on file  Occupational History  . Occupation: [employer]  Tobacco Use  . Smoking status: Former Smoker    Quit date: 02/19/1984    Years since quitting: 35.6  . Smokeless tobacco: Never Used  Vaping Use  .  Vaping Use: Never used  Substance and Sexual Activity  . Alcohol use: Not Currently  . Drug use: Never  . Sexual activity: Not on file  Other Topics Concern  . Not on file  Social History Narrative  . Not on file   Social Determinants of Health   Financial Resource Strain:   . Difficulty of Paying Living Expenses: Not on file  Food Insecurity:   . Worried About Charity fundraiser in the Last Year: Not on file  . Ran Out of Food in the Last Year: Not on file  Transportation Needs:   . Lack of Transportation  (Medical): Not on file  . Lack of Transportation (Non-Medical): Not on file  Physical Activity:   . Days of Exercise per Week: Not on file  . Minutes of Exercise per Session: Not on file  Stress:   . Feeling of Stress : Not on file  Social Connections:   . Frequency of Communication with Friends and Family: Not on file  . Frequency of Social Gatherings with Friends and Family: Not on file  . Attends Religious Services: Not on file  . Active Member of Clubs or Organizations: Not on file  . Attends Archivist Meetings: Not on file  . Marital Status: Not on file  Intimate Partner Violence:   . Fear of Current or Ex-Partner: Not on file  . Emotionally Abused: Not on file  . Physically Abused: Not on file  . Sexually Abused: Not on file    FAMILY HISTORY: Family History  Problem Relation Age of Onset  . Hypertension Mother   . Breast cancer Neg Hx     ALLERGIES:  is allergic to sulfa antibiotics and codeine.  MEDICATIONS:  Current Outpatient Medications  Medication Sig Dispense Refill  . amLODipine (NORVASC) 10 MG tablet Take 1 tablet (10 mg total) by mouth daily. 90 tablet 3  . B Complex Vitamins (B COMPLEX 100 PO)     . camphor-menthol (SARNA) lotion Apply 1 application topically as needed for itching. 222 mL 0  . candesartan (ATACAND) 16 MG tablet TAKE 1 TABLET BY MOUTH DAILY 90 tablet 3  . carvedilol (COREG) 6.25 MG tablet TAKE 1 TABLET BY MOUTH TWICE DAILY WITH A MEAL 180 tablet 3  . Cholecalciferol (VITAMIN D3) 2000 units capsule Take 2,000 Units by mouth daily.    . diclofenac sodium (VOLTAREN) 1 % GEL Apply 4 g topically 4 (four) times daily as needed. 500 g 6  . furosemide (LASIX) 20 MG tablet TAKE 1/2 TABLET BY MOUTH DAILY 45 tablet 3  . hydrALAZINE (APRESOLINE) 25 MG tablet TAKE 1 TABLET BY MOUTH TWICE DAILY 180 tablet 3  . hydrOXYzine (ATARAX/VISTARIL) 25 MG tablet Take 1 tablet (25 mg total) by mouth every 6 (six) hours. 12 tablet 0  . Multiple Vitamin  (MULTIVITAMIN) capsule Take 1 capsule by mouth daily.      Marland Kitchen triamcinolone cream (KENALOG) 0.1 % Apply 1 application topically 2 (two) times daily. 30 g 0   No current facility-administered medications for this visit.    REVIEW OF SYSTEMS:   A 10+ POINT REVIEW OF SYSTEMS WAS OBTAINED including neurology, dermatology, psychiatry, cardiac, respiratory, lymph, extremities, GI, GU, Musculoskeletal, constitutional, breasts, reproductive, HEENT.  All pertinent positives are noted in the HPI.  All others are negative.   PHYSICAL EXAMINATION:  Vitals:   10/18/19 1330  BP: (!) 146/84  Pulse: 73  Resp: 18  Temp: 97.6 F (36.4 C)  SpO2: 98%  Filed Weights   10/18/19 1330  Weight: 190 lb 4.8 oz (86.3 kg)   .Body mass index is 30.72 kg/m.   GENERAL:alert, in no acute distress and comfortable SKIN: no acute rashes, no significant lesions EYES: conjunctiva are pink and non-injected, sclera anicteric OROPHARYNX: MMM, no exudates, no oropharyngeal erythema or ulceration NECK: supple, no JVD LYMPH:  no palpable lymphadenopathy in the cervical, axillary or inguinal regions LUNGS: clear to auscultation b/l with normal respiratory effort HEART: regular rate & rhythm ABDOMEN:  normoactive bowel sounds , non tender, not distended. No palpable hepatosplenomegaly.  Extremity: no pedal edema PSYCH: alert & oriented x 3 with fluent speech NEURO: no focal motor/sensory deficits  LABORATORY DATA:  I have reviewed the data as listed  . CBC Latest Ref Rng & Units 10/18/2019 06/14/2019 03/10/2019  WBC 4.0 - 10.5 K/uL 3.4(L) 2.8(L) 3.2(L)  Hemoglobin 12.0 - 15.0 g/dL 11.0(L) 10.3(L) 10.6(L)  Hematocrit 36 - 46 % 34.5(L) 32.4(L) 33.4(L)  Platelets 150 - 400 K/uL 96(L) 73(L) 85(L)    . CMP Latest Ref Rng & Units 10/18/2019 06/14/2019 03/10/2019  Glucose 70 - 99 mg/dL 89 124(H) 81  BUN 8 - 23 mg/dL 26(H) 15 17  Creatinine 0.44 - 1.00 mg/dL 1.00 1.03(H) 1.18(H)  Sodium 135 - 145 mmol/L 138 143 142   Potassium 3.5 - 5.1 mmol/L 4.0 4.2 4.1  Chloride 98 - 111 mmol/L 106 111 110  CO2 22 - 32 mmol/L 24 24 23   Calcium 8.9 - 10.3 mg/dL 10.0 9.2 8.9  Total Protein 6.5 - 8.1 g/dL 7.6 7.1 7.5  Total Bilirubin 0.3 - 1.2 mg/dL 0.4 0.5 0.5  Alkaline Phos 38 - 126 U/L 79 77 85  AST 15 - 41 U/L 18 14(L) 14(L)  ALT 0 - 44 U/L 22 13 12    Component     Latest Ref Rng & Units 06/17/2017  Coagulation Factor VIII     57 - 163 % 128  Ristocetin Co-factor, Plasma     50 - 200 % 117  Von Willebrand Antigen, Plasma     50 - 200 % 126  Prothrombin Time     11.4 - 15.2 seconds 12.9  INR      0.98  PFA Interpretation        Collagen / Epinephrine     0 - 193 seconds 192  Fibrinogen     210 - 475 mg/dL 291  APTT     24 - 36 seconds 31  LDH     125 - 245 U/L 230   Component     Latest Ref Rng & Units 01/08/2018  WBC     4.0 - 10.5 K/uL 3.1 (L)  RBC     3.87 - 5.11 MIL/uL 3.71 (L)  Hemoglobin     12.0 - 15.0 g/dL 11.1 (L)  HCT     36.0 - 46.0 % 35.4 (L)  MCV     80.0 - 100.0 fL 95.4  MCH     26.0 - 34.0 pg 29.9  MCHC     30.0 - 36.0 g/dL 31.4  RDW     11.5 - 15.5 % 16.4 (H)  Platelets     150 - 400 K/uL 104 (L)  nRBC     0.0 - 0.2 % 0.0  Neutrophils     % 37  NEUT#     1.7 - 7.7 K/uL 1.2 (L)  Lymphocytes     % 51  Lymphocyte #  0.7 - 4.0 K/uL 1.6  Monocytes Relative     % 12  Monocyte #     0.1 - 1.0 K/uL 0.4  Eosinophil     % 0  Eosinophils Absolute     0.0 - 0.5 K/uL 0.0  Basophil     % 0  Basophils Absolute     0.0 - 0.1 K/uL 0.0  Immature Granulocytes     % 0  Abs Immature Granulocytes     0.00 - 0.07 K/uL 0.00  Sodium     135 - 145 mmol/L 145  Potassium     3.5 - 5.1 mmol/L 4.0  Chloride     98 - 111 mmol/L 111  CO2     22 - 32 mmol/L 25  Glucose     70 - 99 mg/dL 117 (H)  BUN     8 - 23 mg/dL 12  Creatinine     0.44 - 1.00 mg/dL 1.10 (H)  Calcium     8.9 - 10.3 mg/dL 9.3  Total Protein     6.5 - 8.1 g/dL 7.2  Albumin     3.5 - 5.0 g/dL  3.8  AST     15 - 41 U/L 15  ALT     0 - 44 U/L 13  Alkaline Phosphatase     38 - 126 U/L 85  Total Bilirubin     0.3 - 1.2 mg/dL 0.5  GFR, Est Non African American     >60 mL/min 53 (L)  GFR, Est African American     >60 mL/min >60  Anion gap     5 - 15 9  Prothrombin Time     11.4 - 15.2 seconds 12.7  INR      0.96  APTT     24 - 36 seconds 30  Fibrinogen     210 - 475 mg/dL 272   11/26/2018 BM Flow Pathology Report    11/26/2018 BM Bx Report    Surgical Pathology  CASE: [id number]  PATIENT: Karen Waller  Bone Marrow Report    Clinical History: pancytopenia, paraproteinemia, right iliac bone      DIAGNOSIS:   BONE MARROW, ASPIRATE, CLOT, CORE:  -Hypercellular bone marrow for age with trilineage hematopoiesis and  fibrosis.  See comment   PERIPHERAL BLOOD:  -Pancytopenia   COMMENT:   The aspirate, touch imprints, and clot sections are suboptimal which  hinders cytomorphologic evaluation. The core biopsy is optimal and  shows a hypercellular bone marrow (90%) with a mixture of myeloid cell  lines. This is associated with reticulin fibrosis. A significant  CD34-positive blast population is not seen. The myeloid changes are not  considered specific, and hence, it is unclear whether the findings  represent a primary myeloid neoplasm or represent secondary changes.  Correlation with cytogenetic studies is recommended. There is no  evidence of lymphoproliferative process or diagnostic plasma cell  neoplasm.    11/26/2018 BM Cytogenetics:    RADIOGRAPHIC STUDIES: I have personally reviewed the radiological images as listed and agreed with the findings in the report. No results found.  ASSESSMENT & PLAN:   64 y.o. female with  1. Minor Spontaneous?/undetected trauma causing bruising No ecchymosis with bruising  Clotting parameters normal from 06/17/17 with normal PT, aPTT and VWD panel and platelet function testing 01/08/18 PT and aPTT,  fibrinogen WNL  2. Thrombocytopenia Pt's PLT in 10/19/14 were normal at 215k when she was having abnormal bruising Now with mild thrombocytopenia  11/16/2018 US Abdomen ([id number]) revealed "Cholelithiasis. No evidence for acute abnormality." 11/26/2018 BM Bx ([id number]) revealed "-Hypercellular bone marrow for age with trilineage hematopoiesis and fibrosis. Pancytopenia" 11/26/2018 BM Flow Pathology Report [id number]) revealed  "-No significant blastic population identified. 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 Pancytopenia -- BM Bx - hypercellular bone marrow with some reticulin fibrosis. 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 Counts improved today.   PLAN: -Discussed pt labwork today, 10/18/19; anemia, thrombocytopenia, neutropenia, Ferritin is good, B12 & K/L light chains are in progress. -Still no clear explanation for her increased bruising as PLT >50K. Should improve when pt abstains from using Ibuprofen & other NSAIDs. -Advised pt that short-term Ibuprofen use can affect PLT function and long-term Ibuprofen use can also affect PLT counts.  -Mild anemia, thromobocytopenia, and neutropenia - all improved with B-complex supplementation. Could be due to Vitamin B deficiencies. -Pt has a 20q deletion - pancytopenia could also be due to low-risk MDS.  -Will also complete JAK2 mutation analysis in the setting of reticulin fibrosis -Continue B-complex daily  -Will see back in 6 months with labs    FOLLOW UP: RTC with Dr Irene Limbo with labs in 6 months   The total time spent in the appt was 20 minutes and more than 50% was on counseling and direct patient cares.  All of the patient's questions were answered with apparent satisfaction. The patient knows to call the clinic with any problems, questions or concerns.    Sullivan Lone MD [place]  AAHIVMS [facility] Hematology/Oncology Physician [facility]  (Office):       [phone] (Work cell):  [phone] (Fax):           [phone]  10/18/2019 4:56 PM  I, Yevette Edwards, am acting as a scribe for Dr. Sullivan Lone.   .I have reviewed the above documentation for accuracy and completeness, and I agree with the above. Brunetta Genera MD

## 2019-10-19 LAB — KAPPA/LAMBDA LIGHT CHAINS
Kappa free light chain: 28 mg/L — ABNORMAL HIGH (ref 3.3–19.4)
Kappa, lambda light chain ratio: 2 — ABNORMAL HIGH (ref 0.26–1.65)
Lambda free light chains: 14 mg/L (ref 5.7–26.3)

## 2019-11-05 ENCOUNTER — Ambulatory Visit (INDEPENDENT_AMBULATORY_CARE_PROVIDER_SITE_OTHER): Payer: 59 | Admitting: Physician Assistant

## 2019-11-05 ENCOUNTER — Encounter: Payer: Self-pay | Admitting: Physician Assistant

## 2019-11-05 DIAGNOSIS — M25511 Pain in right shoulder: Secondary | ICD-10-CM

## 2019-11-05 NOTE — Progress Notes (Signed)
Office Visit Note   Patient: Karen Waller           Date of Birth: [birth year]           MRN: [id number] Visit Date: 11/05/2019              Requested by: Carol Ada, MD [place],  [place] [address] PCP: Carol Ada, MD  Chief Complaint  Patient presents with  . Right Shoulder - Follow-up    10/15/19 f/u cortisone injection       HPI: The patient is a pleasant 64 year old woman who is 3 weeks status post right shoulder injection.  She reports feeling significantly better.  She can lift more at work.  She does sometimes get pain with certain movements Assessment & Plan: Visit Diagnoses: No diagnosis found.  Plan: We discussed her options at this point.  Certainly I would not advocate another injection today.  If she had the return of pain in a month or so I would do a repeat injection.  If she has a return of pain she can call the office and also ask for an MRI and I will order this.  We discussed physical therapy which she does not really have time to do right now but if she feels she like to do this she may contact the office as well  Follow-Up Instructions: No follow-ups on file.   Ortho Exam  Patient is alert, oriented, no adenopathy, well-dressed, normal affect, normal respiratory effort. Right shoulder: She has full forward elevation without pain internal rotation to almost up to her bra strap with just a small amount of pain excellent strength with resisted abduction resisted external rotation.  Imaging: No results found. No images are attached to the encounter.  Labs: Lab Results  Component Value Date   ESRSEDRATE 26 (H) 11/02/2018   ESRSEDRATE 27 (H) 10/10/2014     Lab Results  Component Value Date   ALBUMIN 3.9 10/18/2019   ALBUMIN 3.8 06/14/2019   ALBUMIN 4.1 03/10/2019    Lab Results  Component Value Date   MG 2.1 06/06/2008   No results found for: VD25OH  No results found for: PREALBUMIN CBC EXTENDED Latest Ref  Rng & Units 10/18/2019 06/14/2019 03/10/2019  WBC 4.0 - 10.5 K/uL 3.4(L) 2.8(L) 3.2(L)  RBC 3.87 - 5.11 MIL/uL 3.59(L) 3.42(L) 3.51(L)  HGB 12.0 - 15.0 g/dL 11.0(L) 10.3(L) 10.6(L)  HCT 36 - 46 % 34.5(L) 32.4(L) 33.4(L)  PLT 150 - 400 K/uL 96(L) 73(L) 85(L)  NEUTROABS 1.7 - 7.7 K/uL 1.2(L) 0.8(L) 0.9(L)  LYMPHSABS 0.7 - 4.0 K/uL 1.7 1.6 1.8     There is no height or weight on file to calculate BMI.  Orders:  No orders of the defined types were placed in this encounter.  No orders of the defined types were placed in this encounter.    Procedures: No procedures performed  Clinical Data: No additional findings.  ROS:  All other systems negative, except as noted in the HPI. Review of Systems  Objective: Vital Signs: There were no vitals taken for this visit.  Specialty Comments:  No specialty comments available.  PMFS History: Patient Active Problem List   Diagnosis Date Noted  . Iron deficiency anemia 06/22/2019  . Pain in left hip 03/24/2018  . Bruising, spontaneous 07/06/2017  . Left Achilles tendinitis 11/08/2016  . Conductive hearing loss, bilateral 08/08/2016  . Elevated lipids 12/16/2012  . HTN (hypertension) 01/05/2011  . CHF (congestive heart  failure) ([place]) 10/30/2010  . Edema 10/30/2010  . Gouty arthropathy 03/17/2008  . OVERWEIGHT 03/17/2008  . ESSENTIAL HYPERTENSION, BENIGN 03/17/2008  . Non-ischemic cardiomyopathy ([place]) 03/17/2008  . PALPITATIONS, OCCASIONAL 03/17/2008   Past Medical History:  Diagnosis Date  . Cardiomyopathy    EF 30-35% diagnosed 02/2008  . Central obesity   . Gout    left knee  . HTN (hypertension)     Family History  Problem Relation Age of Onset  . Hypertension Mother   . Breast cancer Neg Hx     Past Surgical History:  Procedure Laterality Date  . KNEE SURGERY    . TONSILLECTOMY     Social History   Occupational History  . Occupation: [employer]  Tobacco Use  . Smoking status: Former Smoker    Quit date: 02/19/1984     Years since quitting: 35.7  . Smokeless tobacco: Never Used  Vaping Use  . Vaping Use: Never used  Substance and Sexual Activity  . Alcohol use: Not Currently  . Drug use: Never  . Sexual activity: Not on file

## 2019-11-16 ENCOUNTER — Telehealth: Payer: Self-pay | Admitting: Cardiovascular Disease

## 2019-11-16 NOTE — Telephone Encounter (Signed)
New message:     Patient calling stating that she feels weak when moving around. patient states she is not having any SOB.

## 2019-11-16 NOTE — Telephone Encounter (Signed)
That BP is fine

## 2019-11-16 NOTE — Telephone Encounter (Signed)
Called Karen Waller about her message. Karen Waller complaining of feeling lightheaded and just feeling weak. Karen Waller stated she saw her ENT doctor today to get her ears cleaned out to see if that would help. Karen Waller stated it did not help, and the ENT doctor stated it might be her BP, which was 135/78 HR 72. Karen Waller stated her BP usually runs below 140/90. Karen Waller denies SOB or chest pain. Karen Waller stated she has palpitations, but this is not new. Encouraged Karen Waller to call her PCP that something else might be going on since her BP is not bad. Suggest that Karen Waller could see our HTN clinic if needed. Will forward to Dr. Johnsie Cancel for any further advisement.

## 2020-02-09 ENCOUNTER — Ambulatory Visit
Admission: EM | Admit: 2020-02-09 | Discharge: 2020-02-09 | Disposition: A | Discharge status: Home / Self Care | Payer: 59 | Attending: Emergency Medicine | Admitting: Emergency Medicine

## 2020-02-09 ENCOUNTER — Other Ambulatory Visit: Payer: Self-pay

## 2020-02-09 DIAGNOSIS — M7918 Myalgia, other site: Secondary | ICD-10-CM

## 2020-02-09 HISTORY — DX: Other pancytopenia: D61.818

## 2020-02-09 MED ORDER — TIZANIDINE HCL 2 MG PO TABS: 2.0000 mg | ORAL_TABLET | Freq: Four times a day (QID) | ORAL | 0 refills | Status: AC | PRN

## 2020-02-09 NOTE — ED Triage Notes (Signed)
Pt c/o rt mid back pain since Sunday. Denies injury but lifts heavy boxes at work.

## 2020-02-09 NOTE — Discharge Instructions (Addendum)
1-2 tablets per dose (6 hours)

## 2020-02-09 NOTE — ED Provider Notes (Signed)
[facility]    CSN: [id number] Arrival date & time: 02/09/20  1702      History   Chief Complaint Chief Complaint  Patient presents with  . Back Pain    HPI Karen Waller is a 64 y.o. female  presents for right mid back pain since Sunday.  States pain is constant, tight, achy.  Has tried Tylenol without relief.  Denies trauma/injury to the affected area and does not recall an inciting event, though does admit to lifting heavy boxes frequently at work.  Denies fever, saddle area anesthesia, lower extremity numbness/weakness, urinary retention, fecal incontinence.  Past Medical History:  Diagnosis Date  . Cardiomyopathy    EF 30-35% diagnosed 02/2008  . Central obesity   . Gout    left knee  . HTN (hypertension)   . Pancytopenia [facility])     Patient Active Problem List   Diagnosis Date Noted  . Iron deficiency anemia 06/22/2019  . Pain in left hip 03/24/2018  . Bruising, spontaneous 07/06/2017  . Left Achilles tendinitis 11/08/2016  . Conductive hearing loss, bilateral 08/08/2016  . Elevated lipids 12/16/2012  . HTN (hypertension) 01/05/2011  . CHF (congestive heart failure) (HCC) 10/30/2010  . Edema 10/30/2010  . Gouty arthropathy 03/17/2008  . OVERWEIGHT 03/17/2008  . ESSENTIAL HYPERTENSION, BENIGN 03/17/2008  . Non-ischemic cardiomyopathy (HCC) 03/17/2008  . PALPITATIONS, OCCASIONAL 03/17/2008    Past Surgical History:  Procedure Laterality Date  . KNEE SURGERY    . TONSILLECTOMY      OB History   No obstetric history on file.      Home Medications    Prior to Admission medications   Medication Sig Start Date End Date Taking? Authorizing Provider  amLODipine (NORVASC) 10 MG tablet Take 1 tablet (10 mg total) by mouth daily. 06/22/19   Wendall Stade, MD  B Complex Vitamins (B COMPLEX 100 PO)     [provider]  camphor-menthol Wynelle Fanny) lotion Apply 1 application topically as needed for itching. 01/08/19   Cathie Hoops, Amy V, PA-C   candesartan (ATACAND) 16 MG tablet TAKE 1 TABLET BY MOUTH DAILY 04/16/19   Wendall Stade, MD  carvedilol (COREG) 6.25 MG tablet TAKE 1 TABLET BY MOUTH TWICE DAILY WITH A MEAL 06/22/19   Wendall Stade, MD  Cholecalciferol (VITAMIN D3) 2000 units capsule Take 2,000 Units by mouth daily.    [provider]  diclofenac sodium (VOLTAREN) 1 % GEL Apply 4 g topically 4 (four) times daily as needed. 03/24/18   Hilts, Casimiro Needle, MD  furosemide (LASIX) 20 MG tablet TAKE 1/2 TABLET BY MOUTH DAILY 04/16/19   Wendall Stade, MD  hydrALAZINE (APRESOLINE) 25 MG tablet TAKE 1 TABLET BY MOUTH TWICE DAILY 06/22/19   Wendall Stade, MD  hydrOXYzine (ATARAX/VISTARIL) 25 MG tablet Take 1 tablet (25 mg total) by mouth every 6 (six) hours. 01/08/19   Cathie Hoops, Amy V, PA-C  Multiple Vitamin (MULTIVITAMIN) capsule Take 1 capsule by mouth daily.      [provider]  tiZANidine (ZANAFLEX) 2 MG tablet Take 1 tablet (2 mg total) by mouth every 6 (six) hours as needed for up to 5 days for muscle spasms. 02/09/20 02/14/20  Hall-Potvin, [place], PA-C  triamcinolone cream (KENALOG) 0.1 % Apply 1 application topically 2 (two) times daily. 01/08/19   Belinda Fisher, PA-C    Family History Family History  Problem Relation Age of Onset  . Hypertension Mother   . Breast cancer Neg Hx  Social History Social History   Tobacco Use  . Smoking status: Former Smoker    Quit date: 02/19/1984    Years since quitting: 36.0  . Smokeless tobacco: Never Used  Vaping Use  . Vaping Use: Never used  Substance Use Topics  . Alcohol use: Not Currently  . Drug use: Never     Allergies   Sulfa antibiotics and Codeine   Review of Systems Review of Systems  Constitutional: Negative for fatigue and fever.  HENT: Negative for ear pain, sinus pain, sore throat and voice change.   Eyes: Negative for pain, redness and visual disturbance.  Respiratory: Negative for cough and shortness of breath.   Cardiovascular: Negative for  chest pain and palpitations.  Gastrointestinal: Negative for abdominal pain, diarrhea and vomiting.  Musculoskeletal: Positive for back pain. Negative for arthralgias and myalgias.  Skin: Negative for rash and wound.  Neurological: Negative for syncope and headaches.     Physical Exam Triage Vital Signs ED Triage Vitals  Enc Vitals Group     BP 02/09/20 1755 (!) 164/83     Pulse --      Resp 02/09/20 1755 18     Temp 02/09/20 1755 98 F (36.7 C)     Temp Source 02/09/20 1755 Oral     SpO2 02/09/20 1755 98 %     Weight --      Height --      Head Circumference --      Peak Flow --      Pain Score 02/09/20 1815 8     Pain Loc --      Pain Edu? --      Excl. in [place]? --    No data found.  Updated Vital Signs BP (!) 164/83   Temp 98 F (36.7 C) (Oral)   Resp 18   SpO2 98%   Visual Acuity Right Eye Distance:   Left Eye Distance:   Bilateral Distance:    Right Eye Near:   Left Eye Near:    Bilateral Near:     Physical Exam Constitutional:      General: She is not in acute distress. HENT:     Head: Normocephalic and atraumatic.  Eyes:     General: No scleral icterus.    Pupils: Pupils are equal, round, and reactive to light.  Cardiovascular:     Rate and Rhythm: Normal rate.  Pulmonary:     Effort: Pulmonary effort is normal.  Musculoskeletal:        General: Tenderness present. No swelling.     Comments: Bilateral (R>L) thoracic tenderness is paraspinous process, scapula.  Full active ROM of upper extremities, though endorsing pain when doing so.  Skin:    Coloration: Skin is not jaundiced or pale.  Neurological:     Mental Status: She is alert and oriented to person, place, and time.      UC Treatments / Results  Labs (all labs ordered are listed, but only abnormal results are displayed) Labs Reviewed - No data to display  EKG   Radiology No results found.  Procedures Procedures (including critical care time)  Medications Ordered in  UC Medications - No data to display  Initial Impression / Assessment and Plan / UC Course  I have reviewed the triage vital signs and the nursing notes.  Pertinent labs & imaging results that were available during my care of the patient were reviewed by me ,and considered in my medical decision making (see chart  for details).     H&P consistent with musculoskeletal pain, likely due to overuse from work.  Provided work note, advised rice and will add muscle relaxers adjuvant.  Return precautions discussed, pt verbalized understanding and is agreeable to plan. Final Clinical Impressions(s) / UC Diagnoses   Final diagnoses:  Musculoskeletal pain     Discharge Instructions     1-2 tablets per dose (6 hours)    ED Prescriptions    Medication Sig Dispense Auth. Provider   tiZANidine (ZANAFLEX) 2 MG tablet Take 1 tablet (2 mg total) by mouth every 6 (six) hours as needed for up to 5 days for muscle spasms. 20 tablet Hall-Potvin, [place], PA-C     I have reviewed the PDMP during this encounter.   Hall-Potvin, [place], [place] 02/10/20 [phone]

## 2020-02-10 ENCOUNTER — Encounter: Payer: Self-pay | Admitting: Emergency Medicine

## 2020-02-14 ENCOUNTER — Telehealth: Payer: Self-pay | Admitting: Hematology

## 2020-02-14 NOTE — Telephone Encounter (Signed)
Rescheduled follow-up appointment per 12/27 schedule message. Patient is aware of changes. 

## 2020-02-15 ENCOUNTER — Encounter: Payer: Self-pay | Admitting: Orthopaedic Surgery

## 2020-02-15 ENCOUNTER — Ambulatory Visit: Payer: Self-pay

## 2020-02-15 ENCOUNTER — Other Ambulatory Visit: Payer: Self-pay

## 2020-02-15 ENCOUNTER — Ambulatory Visit (INDEPENDENT_AMBULATORY_CARE_PROVIDER_SITE_OTHER): Payer: 59 | Admitting: Orthopaedic Surgery

## 2020-02-15 DIAGNOSIS — M940 Chondrocostal junction syndrome [Tietze]: Secondary | ICD-10-CM

## 2020-02-15 DIAGNOSIS — M545 Low back pain, unspecified: Secondary | ICD-10-CM

## 2020-02-15 MED ORDER — METHYLPREDNISOLONE 4 MG PO TBPK: ORAL_TABLET | ORAL | 0 refills | Status: DC

## 2020-02-15 NOTE — Progress Notes (Signed)
Office Visit Note   Patient: Karen Waller           Date of Birth: 10-03-1955           MRN: [id number] Visit Date: 02/15/2020              Requested by: Carol Ada, MD [address],  [place] [id number] PCP: Carol Ada, MD   Assessment & Plan: Visit Diagnoses:  1. Acute right-sided low back pain without sciatica   2. Costochondritis     Plan: Impression is chronic right-sided lower back pain and costochondritis.  I recommended rest, heat and NSAIDs, but the patient does note that she has underlying bleeding disorder and is unable to take NSAIDs.  She does have Voltaren gel which she will start to use.  I will also call in a steroid to take as long as her hematologist approves.  I have tried contacting them without an answer so the patient will make all efforts at doing this her self.  She will follow up with [place] as needed.  Follow-Up Instructions: Return if symptoms worsen or fail to improve.   Orders:  Orders Placed This Encounter  Procedures  . XR Lumbar Spine 2-3 Views   Meds ordered this encounter  Medications  . methylPREDNISolone (MEDROL DOSEPAK) 4 MG TBPK tablet    Sig: Take as directed    Dispense:  21 tablet    Refill:  0      Procedures: No procedures performed   Clinical Data: No additional findings.   Subjective: Chief Complaint  Patient presents with  . Lower Back - Pain    HPI patient is a pleasant 64 year old female who comes in today with right lateral mid to lower back pain for the past week.  No known injury or change in activity.  No recent virus.  She does note that she frequently lifts heavy boxes while at work.  The pain she has is primarily with coughing, sneezing or taking a deep breath although this has improved over the past few days.  She also notes a pulling type pain across her lower back that is worse when lying supine or with any other activity.  She has been taking Tylenol and muscle relaxers which do  seem to alleviate her symptoms.  She has noted 1 instance where she had numbness to the anterior thigh.  No other occurrences of this.  No bowel or bladder change and no saddle paresthesias.  Review of Systems as detailed in HPI.  All others reviewed and are negative.   Objective: Vital Signs: There were no vitals taken for this visit.  Physical Exam well-developed well-nourished female no acute distress.  Alert oriented x3.  Ortho Exam lumbar spine exam shows no spinous tenderness.  She does have pinpoint tenderness along lateral ribs around T12.  Minimal pain with range of motion of the lumbar spine.  Negative straight leg raise.  No focal weakness.  She is neurovascular intact distally.  Specialty Comments:  No specialty comments available.  Imaging: XR Lumbar Spine 2-3 Views  Result Date: 02/15/2020 Moderate degenerative changes worse at L4-5 and L5-S1 with a retrolisthesis at L5    PMFS History: Patient Active Problem List   Diagnosis Date Noted  . Iron deficiency anemia 06/22/2019  . Pain in left hip 03/24/2018  . Bruising, spontaneous 07/06/2017  . Left Achilles tendinitis 11/08/2016  . Conductive hearing loss, bilateral 08/08/2016  . Elevated lipids 12/16/2012  .  HTN (hypertension) 01/05/2011  . CHF (congestive heart failure) (HCC) 10/30/2010  . Edema 10/30/2010  . Gouty arthropathy 03/17/2008  . OVERWEIGHT 03/17/2008  . ESSENTIAL HYPERTENSION, BENIGN 03/17/2008  . Non-ischemic cardiomyopathy (HCC) 03/17/2008  . PALPITATIONS, OCCASIONAL 03/17/2008   Past Medical History:  Diagnosis Date  . Cardiomyopathy    EF 30-35% diagnosed 02/2008  . Central obesity   . Gout    left knee  . HTN (hypertension)   . Pancytopenia (HCC)     Family History  Problem Relation Age of Onset  . Hypertension Mother   . Breast cancer Neg Hx     Past Surgical History:  Procedure Laterality Date  . KNEE SURGERY    . TONSILLECTOMY     Social History   Occupational History  .  Occupation: [employer]  Tobacco Use  . Smoking status: Former Smoker    Quit date: 02/19/1984    Years since quitting: 36.0  . Smokeless tobacco: Never Used  Vaping Use  . Vaping Use: Never used  Substance and Sexual Activity  . Alcohol use: Not Currently  . Drug use: Never  . Sexual activity: Not on file

## 2020-02-16 ENCOUNTER — Other Ambulatory Visit: Payer: Self-pay | Admitting: *Deleted

## 2020-02-16 DIAGNOSIS — D61818 Other pancytopenia: Secondary | ICD-10-CM

## 2020-02-16 DIAGNOSIS — D469 Myelodysplastic syndrome, unspecified: Secondary | ICD-10-CM

## 2020-02-22 ENCOUNTER — Other Ambulatory Visit: Payer: Self-pay | Admitting: Family Medicine

## 2020-02-22 DIAGNOSIS — Z1231 Encounter for screening mammogram for malignant neoplasm of breast: Secondary | ICD-10-CM

## 2020-02-25 ENCOUNTER — Other Ambulatory Visit: Payer: Self-pay

## 2020-02-25 ENCOUNTER — Ambulatory Visit
Admission: RE | Admit: 2020-02-25 | Discharge: 2020-02-25 | Disposition: A | Discharge status: Home / Self Care | Payer: 59 | Source: Ambulatory Visit | Attending: Family Medicine | Admitting: Family Medicine

## 2020-02-25 DIAGNOSIS — Z1231 Encounter for screening mammogram for malignant neoplasm of breast: Secondary | ICD-10-CM

## 2020-03-03 ENCOUNTER — Telehealth: Payer: Self-pay | Admitting: Hematology

## 2020-03-03 NOTE — Telephone Encounter (Signed)
Called patient regarding upcoming appointments, left a voicemail. 

## 2020-03-12 NOTE — Progress Notes (Signed)
HEMATOLOGY/ONCOLOGY CLINIC NOTE  Date of Service: 03/12/2020  Patient Care Team: Carol Ada, MD as PCP - General (Family Medicine) Josue Hector, MD as PCP - Cardiology (Cardiology)  CHIEF COMPLAINTS/PURPOSE OF CONSULTATION:  Spontaneous bruising  Pancytopenia  HISTORY OF PRESENTING ILLNESS:   Karen Waller is a wonderful 65 y.o. female who has been previously followed by my colleague Dr. Grace Isaac for evaluation and management of Spontaneous bruising. The pt reports that she is doing well overall.   The pt reports that she has continued to have spontaneous bruising, without raised lumps. She first noticed this in the last two years and takes Meloxicam and Amlodipine regularly, and aspirin very rarely. She also notes that she does not always bruise in response to bumping into objects.   She denies any GI bleeding, large muscle hematoma, bleeding into her joints. She notes that she did not have heavy periods in the past. She has had knee surgery and a tonsillectomy without bleeding complications.   Most recent lab results (06/17/17) of CBC w/diff and CMP is as follows: all values are WNL except for RDW at 17.0, PLT at 118k.  On review of systems, pt reports bruising easily, and denies fevers, chills, night sweats, abdominal pains, leg swelling, and any other symptoms.   On PMHx the pt reports tonsillectomy and knee surgery. On Family Hx the pt denies blood disorders, clotting disorders.  Interval History:   LASHAE WOLLENBERG returns today for management and evaluation of easy bruisability and pancytopenia. The patient's last visit with [place] was on 10/18/2019. The pt reports that she is doing well overall.  The pt reports that she has been experiencing some spontaneous bruising recently in her left arm in mid-December. This has improved since, clearing around mid-January. The pt denies any recent trauma or injuries. The pt was only taking Tylenol for pain. She is still on  Norvasc.  The pt notes that she has severe gum bleeding and clots from getting her teeth cleaned for two days following.  The pt admits she was on Prednisone following the bruising after injuring her back at work.   The pt received her COVID booster. This was prior to the bruising, but was on her left arm. She experienced lymph nodes the day after.  The pt notes that she will get her flu vaccine at next visit with PCP.  Lab results today 03/13/2020 of CBC w/diff and CMP is as follows: all values are WNL except for WBC at 2.8K, RBC at 3.25, Hgb at 9.9, HCT at 31.5, RDW at 17.5, Plt at 74K, nRBC at 0.7, Neutro Abs at 0.9K, Glucose at 164, Creatinine at 1.11, GFR est at 56. 03/13/2020 LDH at 218. 03/13/2020 Ferritin is in progress.  03/13/2020 Vitamin B12 in progress. 03/13/2020 JAK2 in progress.  On review of systems, pt reports bruising and denies blood in stools/urine, infection issues, fatigue, abdominal pain, leg swelling, back pain, and any other symptoms.   MEDICAL HISTORY:  Past Medical History:  Diagnosis Date  . Cardiomyopathy    EF 30-35% diagnosed 02/2008  . Central obesity   . Gout    left knee  . HTN (hypertension)   . Pancytopenia ([place])     SURGICAL HISTORY: Past Surgical History:  Procedure Laterality Date  . KNEE SURGERY    . TONSILLECTOMY      SOCIAL HISTORY: Social History   Socioeconomic History  . Marital status: Married    Spouse name: Not on file  .  Number of children: 2  . Years of education: Not on file  . Highest education level: Not on file  Occupational History  . Occupation: [employer]  Tobacco Use  . Smoking status: Former Smoker    Quit date: 02/19/1984    Years since quitting: 36.0  . Smokeless tobacco: Never Used  Vaping Use  . Vaping Use: Never used  Substance and Sexual Activity  . Alcohol use: Not Currently  . Drug use: Never  . Sexual activity: Not on file  Other Topics Concern  . Not on file  Social History Narrative  . Not  on file   Social Determinants of Health   Financial Resource Strain: Not on file  Food Insecurity: Not on file  Transportation Needs: Not on file  Physical Activity: Not on file  Stress: Not on file  Social Connections: Not on file  Intimate Partner Violence: Not on file    FAMILY HISTORY: Family History  Problem Relation Age of Onset  . Hypertension Mother   . Breast cancer Neg Hx     ALLERGIES:  is allergic to sulfa antibiotics and codeine.  MEDICATIONS:  Current Outpatient Medications  Medication Sig Dispense Refill  . amLODipine (NORVASC) 10 MG tablet Take 1 tablet (10 mg total) by mouth daily. 90 tablet 3  . B Complex Vitamins (B COMPLEX 100 PO)     . camphor-menthol (SARNA) lotion Apply 1 application topically as needed for itching. 222 mL 0  . candesartan (ATACAND) 16 MG tablet TAKE 1 TABLET BY MOUTH DAILY 90 tablet 3  . carvedilol (COREG) 6.25 MG tablet TAKE 1 TABLET BY MOUTH TWICE DAILY WITH A MEAL 180 tablet 3  . Cholecalciferol (VITAMIN D3) 2000 units capsule Take 2,000 Units by mouth daily.    . diclofenac sodium (VOLTAREN) 1 % GEL Apply 4 g topically 4 (four) times daily as needed. 500 g 6  . furosemide (LASIX) 20 MG tablet TAKE 1/2 TABLET BY MOUTH DAILY 45 tablet 3  . hydrALAZINE (APRESOLINE) 25 MG tablet TAKE 1 TABLET BY MOUTH TWICE DAILY 180 tablet 3  . hydrOXYzine (ATARAX/VISTARIL) 25 MG tablet Take 1 tablet (25 mg total) by mouth every 6 (six) hours. 12 tablet 0  . methylPREDNISolone (MEDROL DOSEPAK) 4 MG TBPK tablet Take as directed 21 tablet 0  . Multiple Vitamin (MULTIVITAMIN) capsule Take 1 capsule by mouth daily.      Marland Kitchen triamcinolone cream (KENALOG) 0.1 % Apply 1 application topically 2 (two) times daily. 30 g 0   No current facility-administered medications for this visit.    REVIEW OF SYSTEMS:   10 Point review of Systems was done is negative except as noted above.   PHYSICAL EXAMINATION:  Vitals:   03/13/20 1433  BP: 131/67  Pulse: 82   Resp: 20  Temp: 98.2 F (36.8 C)  SpO2: 97%   Filed Weights   03/13/20 1433  Weight: 192 lb (87.1 kg)   .Body mass index is 30.99 kg/m.    GENERAL:alert, in no acute distress and comfortable SKIN: no acute rashes, no significant lesions EYES: conjunctiva are pink and non-injected, sclera anicteric OROPHARYNX: MMM, no exudates, no oropharyngeal erythema or ulceration NECK: supple, no JVD LYMPH:  no palpable lymphadenopathy in the cervical, axillary or inguinal regions LUNGS: clear to auscultation b/l with normal respiratory effort HEART: regular rate & rhythm ABDOMEN:  normoactive bowel sounds , non tender, not distended. Extremity: no pedal edema PSYCH: alert & oriented x 3 with fluent speech NEURO: no focal  motor/sensory deficits    LABORATORY DATA:  I have reviewed the data as listed  . CBC Latest Ref Rng & Units 03/13/2020 10/18/2019 06/14/2019  WBC 4.0 - 10.5 K/uL 2.8(L) 3.4(L) 2.8(L)  Hemoglobin 12.0 - 15.0 g/dL 9.9(L) 11.0(L) 10.3(L)  Hematocrit 36.0 - 46.0 % 31.5(L) 34.5(L) 32.4(L)  Platelets 150 - 400 K/uL 74(L) 96(L) 73(L)    . CMP Latest Ref Rng & Units 03/13/2020 10/18/2019 06/14/2019  Glucose 70 - 99 mg/dL 164(H) 89 124(H)  BUN 8 - 23 mg/dL 17 26(H) 15  Creatinine 0.44 - 1.00 mg/dL 1.11(H) 1.00 1.03(H)  Sodium 135 - 145 mmol/L 141 138 143  Potassium 3.5 - 5.1 mmol/L 3.8 4.0 4.2  Chloride 98 - 111 mmol/L 109 106 111  CO2 22 - 32 mmol/L 24 24 24   Calcium 8.9 - 10.3 mg/dL 9.0 10.0 9.2  Total Protein 6.5 - 8.1 g/dL 7.1 7.6 7.1  Total Bilirubin 0.3 - 1.2 mg/dL 0.6 0.4 0.5  Alkaline Phos 38 - 126 U/L 75 79 77  AST 15 - 41 U/L 15 18 14(L)  ALT 0 - 44 U/L 13 22 13    Component     Latest Ref Rng & Units 06/17/2017  Coagulation Factor VIII     57 - 163 % 128  Ristocetin Co-factor, Plasma     50 - 200 % 117  Von Willebrand Antigen, Plasma     50 - 200 % 126  Prothrombin Time     11.4 - 15.2 seconds 12.9  INR      0.98  PFA Interpretation         Collagen / Epinephrine     0 - 193 seconds 192  Fibrinogen     210 - 475 mg/dL 291  APTT     24 - 36 seconds 31  LDH     125 - 245 U/L 230   Component     Latest Ref Rng & Units 01/08/2018  WBC     4.0 - 10.5 K/uL 3.1 (L)  RBC     3.87 - 5.11 MIL/uL 3.71 (L)  Hemoglobin     12.0 - 15.0 g/dL 11.1 (L)  HCT     36.0 - 46.0 % 35.4 (L)  MCV     80.0 - 100.0 fL 95.4  MCH     26.0 - 34.0 pg 29.9  MCHC     30.0 - 36.0 g/dL 31.4  RDW     11.5 - 15.5 % 16.4 (H)  Platelets     150 - 400 K/uL 104 (L)  nRBC     0.0 - 0.2 % 0.0  Neutrophils     % 37  NEUT#     1.7 - 7.7 K/uL 1.2 (L)  Lymphocytes     % 51  Lymphocyte #     0.7 - 4.0 K/uL 1.6  Monocytes Relative     % 12  Monocyte #     0.1 - 1.0 K/uL 0.4  Eosinophil     % 0  Eosinophils Absolute     0.0 - 0.5 K/uL 0.0  Basophil     % 0  Basophils Absolute     0.0 - 0.1 K/uL 0.0  Immature Granulocytes     % 0  Abs Immature Granulocytes     0.00 - 0.07 K/uL 0.00  Sodium     135 - 145 mmol/L 145  Potassium     3.5 - 5.1 mmol/L 4.0  Chloride  98 - 111 mmol/L 111  CO2     22 - 32 mmol/L 25  Glucose     70 - 99 mg/dL 117 (H)  BUN     8 - 23 mg/dL 12  Creatinine     0.44 - 1.00 mg/dL 1.10 (H)  Calcium     8.9 - 10.3 mg/dL 9.3  Total Protein     6.5 - 8.1 g/dL 7.2  Albumin     3.5 - 5.0 g/dL 3.8  AST     15 - 41 U/L 15  ALT     0 - 44 U/L 13  Alkaline Phosphatase     38 - 126 U/L 85  Total Bilirubin     0.3 - 1.2 mg/dL 0.5  GFR, Est Non African American     >60 mL/min 53 (L)  GFR, Est African American     >60 mL/min >60  Anion gap     5 - 15 9  Prothrombin Time     11.4 - 15.2 seconds 12.7  INR      0.96  APTT     24 - 36 seconds 30  Fibrinogen     210 - 475 mg/dL 272   11/26/2018 BM Flow Pathology Report    11/26/2018 BM Bx Report    Surgical Pathology  CASE: [id number]  PATIENT: Ilee Luber  Bone Marrow Report    Clinical History: pancytopenia, paraproteinemia, right  iliac bone      DIAGNOSIS:   BONE MARROW, ASPIRATE, CLOT, CORE:  -Hypercellular bone marrow for age with trilineage hematopoiesis and  fibrosis.  See comment   PERIPHERAL BLOOD:  -Pancytopenia   COMMENT:   The aspirate, touch imprints, and clot sections are suboptimal which  hinders cytomorphologic evaluation. The core biopsy is optimal and  shows a hypercellular bone marrow (90%) with a mixture of myeloid cell  lines. This is associated with reticulin fibrosis. A significant  CD34-positive blast population is not seen. The myeloid changes are not  considered specific, and hence, it is unclear whether the findings  represent a primary myeloid neoplasm or represent secondary changes.  Correlation with cytogenetic studies is recommended. There is no  evidence of lymphoproliferative process or diagnostic plasma cell  neoplasm.    11/26/2018 BM Cytogenetics:    RADIOGRAPHIC STUDIES: I have personally reviewed the radiological images as listed and agreed with the findings in the report. MM 3D SCREEN BREAST BILATERAL  Result Date: 02/25/2020 CLINICAL DATA:  Screening. EXAM: DIGITAL SCREENING BILATERAL MAMMOGRAM WITH TOMO AND CAD COMPARISON:  Previous exam(s). ACR Breast Density Category b: There are scattered areas of fibroglandular density. FINDINGS: There are no findings suspicious for malignancy. Images were processed with CAD. IMPRESSION: No mammographic evidence of malignancy. A result letter of this screening mammogram will be mailed directly to the patient. RECOMMENDATION: Screening mammogram in one year. (Code:SM-B-01Y) BI-RADS CATEGORY  1: Negative. Electronically Signed   By: Marin Olp M.D.   On: 02/25/2020 10:22   XR Lumbar Spine 2-3 Views  Result Date: 02/15/2020 Moderate degenerative changes worse at L4-5 and L5-S1 with a retrolisthesis at L5   ASSESSMENT & PLAN:   65 y.o. female with  1. Minor Spontaneous?/undetected trauma causing bruising No  ecchymosis with bruising  Clotting parameters normal from 06/17/17 with normal PT, aPTT and VWD panel and platelet function testing 01/08/18 PT and aPTT, fibrinogen WNL  2. Thrombocytopenia Pt's PLT in 10/19/14 were normal at 215k when she was having abnormal bruising Now  with mild thrombocytopenia  11/16/2018 US Abdomen ([id number]) revealed "Cholelithiasis. No evidence for acute abnormality." 11/26/2018 BM Bx [id number]) revealed "-Hypercellular bone marrow for age with trilineage hematopoiesis and fibrosis. Pancytopenia" 11/26/2018 BM Flow Pathology Report [id number]) revealed  "-No significant blastic population identified. 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 Pancytopenia -- BM Bx - hypercellular bone marrow with some reticulin fibrosis. 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 Counts improved today.   PLAN: -Discussed pt labwork today, 03/13/2020; no major changes, WBC improved, Hgb stable, Plt stable from last year, other labs in progress. -Advised pt we will continue to monitor her other labs. -Recommend pt keep skin well-moisturized to reduce bruising. -Advised pt regarding her 20q deletion. Will observe JAK2 results. -If increasingly bothersome bruising, discussed potential for Lysteda to allow clots to stabilize clots. Will continue to monitor intermittent skin bruising prior to starting this. -Recommend pt stay up-to-date with annual Flu vaccine, Pneumovax vaccines, and Shingrix vaccine.  -Recommend use of pressure sleeves if engaging in activity that makes swinging arm motion. -Recommend pt to discuss w PCP regarding alternative to Amlodipine. The pt will f/u with PCP this Thursday regarding this matter. -Will continue with supportive treatment at this time. -Continue Vitamin B-complex daily. -Will see back in 6 months with  labs.   FOLLOW UP: RTC with Dr Irene Limbo with labs in 6 months    The total time spent in the appointment was 20 minutes and more than 50% was on counseling and direct patient cares.   All of the patient's questions were answered with apparent satisfaction. The patient knows to call the clinic with any problems, questions or concerns.    Sullivan Lone MD [place] AAHIVMS [facility] Hematology/Oncology Physician [facility]  (Office):       [phone] (Work cell):  [phone] (Fax):           [phone]  03/12/2020 9:44 AM  I, Reinaldo Raddle, am acting as scribe for Dr. Sullivan Lone, MD.    .I have reviewed the above documentation for accuracy and completeness, and I agree with the above. Brunetta Genera MD

## 2020-03-13 ENCOUNTER — Other Ambulatory Visit: Payer: Self-pay

## 2020-03-13 ENCOUNTER — Inpatient Hospital Stay (HOSPITAL_BASED_OUTPATIENT_CLINIC_OR_DEPARTMENT_OTHER): Payer: 59 | Admitting: Hematology

## 2020-03-13 ENCOUNTER — Inpatient Hospital Stay: Payer: 59 | Attending: Hematology

## 2020-03-13 VITALS — BP 131/67 | HR 82 | Temp 98.2°F | Resp 20 | Ht 66.0 in | Wt 192.0 lb

## 2020-03-13 DIAGNOSIS — I10 Essential (primary) hypertension: Secondary | ICD-10-CM | POA: Insufficient documentation

## 2020-03-13 DIAGNOSIS — Z8249 Family history of ischemic heart disease and other diseases of the circulatory system: Secondary | ICD-10-CM | POA: Diagnosis not present

## 2020-03-13 DIAGNOSIS — Z79899 Other long term (current) drug therapy: Secondary | ICD-10-CM | POA: Diagnosis not present

## 2020-03-13 DIAGNOSIS — D61818 Other pancytopenia: Secondary | ICD-10-CM | POA: Diagnosis not present

## 2020-03-13 DIAGNOSIS — D469 Myelodysplastic syndrome, unspecified: Secondary | ICD-10-CM

## 2020-03-13 DIAGNOSIS — R233 Spontaneous ecchymoses: Secondary | ICD-10-CM | POA: Insufficient documentation

## 2020-03-13 DIAGNOSIS — Z87891 Personal history of nicotine dependence: Secondary | ICD-10-CM | POA: Insufficient documentation

## 2020-03-13 LAB — CBC WITH DIFFERENTIAL (CANCER CENTER ONLY)
Abs Immature Granulocytes: 0.06 10*3/uL (ref 0.00–0.07)
Basophils Absolute: 0 10*3/uL (ref 0.0–0.1)
Basophils Relative: 0 %
Eosinophils Absolute: 0 10*3/uL (ref 0.0–0.5)
Eosinophils Relative: 0 %
HCT: 31.5 % — ABNORMAL LOW (ref 36.0–46.0)
Hemoglobin: 9.9 g/dL — ABNORMAL LOW (ref 12.0–15.0)
Immature Granulocytes: 2 %
Lymphocytes Relative: 53 %
Lymphs Abs: 1.5 10*3/uL (ref 0.7–4.0)
MCH: 30.5 pg (ref 26.0–34.0)
MCHC: 31.4 g/dL (ref 30.0–36.0)
MCV: 96.9 fL (ref 80.0–100.0)
Monocytes Absolute: 0.4 10*3/uL (ref 0.1–1.0)
Monocytes Relative: 13 %
Neutro Abs: 0.9 10*3/uL — ABNORMAL LOW (ref 1.7–7.7)
Neutrophils Relative %: 32 %
Platelet Count: 74 10*3/uL — ABNORMAL LOW (ref 150–400)
RBC: 3.25 MIL/uL — ABNORMAL LOW (ref 3.87–5.11)
RDW: 17.5 % — ABNORMAL HIGH (ref 11.5–15.5)
WBC Count: 2.8 10*3/uL — ABNORMAL LOW (ref 4.0–10.5)
nRBC: 0.7 % — ABNORMAL HIGH (ref 0.0–0.2)

## 2020-03-13 LAB — CMP (CANCER CENTER ONLY)
ALT: 13 U/L (ref 0–44)
AST: 15 U/L (ref 15–41)
Albumin: 3.8 g/dL (ref 3.5–5.0)
Alkaline Phosphatase: 75 U/L (ref 38–126)
Anion gap: 8 (ref 5–15)
BUN: 17 mg/dL (ref 8–23)
CO2: 24 mmol/L (ref 22–32)
Calcium: 9 mg/dL (ref 8.9–10.3)
Chloride: 109 mmol/L (ref 98–111)
Creatinine: 1.11 mg/dL — ABNORMAL HIGH (ref 0.44–1.00)
GFR, Estimated: 56 mL/min — ABNORMAL LOW (ref >60)
Glucose, Bld: 164 mg/dL — ABNORMAL HIGH (ref 70–99)
Potassium: 3.8 mmol/L (ref 3.5–5.1)
Sodium: 141 mmol/L (ref 135–145)
Total Bilirubin: 0.6 mg/dL (ref 0.3–1.2)
Total Protein: 7.1 g/dL (ref 6.5–8.1)

## 2020-03-13 LAB — LACTATE DEHYDROGENASE: LDH: 218 U/L — ABNORMAL HIGH (ref 98–192)

## 2020-03-13 LAB — FERRITIN: Ferritin: 83 ng/mL (ref 11–307)

## 2020-03-13 LAB — SAMPLE TO BLOOD BANK

## 2020-03-13 LAB — VITAMIN B12: Vitamin B-12: 3102 pg/mL — ABNORMAL HIGH (ref 180–914)

## 2020-03-20 LAB — JAK2 (INCLUDING V617F AND EXON 12), MPL,& CALR-NEXT GEN SEQ

## 2020-03-20 NOTE — Progress Notes (Signed)
Patient ID: Karen Waller, female   DOB: 05-05-1955, 65 y.o.   MRN: [id number]     Karen Waller is seen today for DCM, HTN palpitations and elevated lipids.. She denies syncope, SSCP, dyspnea or edema.. Her EF had been as low as 35%. She is tolerating BB and ARB with no palpitations. Work at [employer] is [employer] well. She is seeing a dietician for initial Rx of her elevated lipids. Compliant with meds  Diuretics can cause flairs in gout especially HCTZ Atacand worked better than Cozaar.   EF 47% by MRI 01/13/13.  No scar.    TTE reviewed 07/16/18 EF 55-60% normal valves GLS -16.6  Monitor June 2020 no PAF some atrial and ventricular ectopy but less than 1% beats and did not correlate With symptoms   She is watching her salt but still overweight and sedentary. Walking her shepherd Titan less as she Hurt her shoulder and he weighs 90 lbs   Sees hematology regularly for pancytopenia. BM aspirate 11/26/18 hypercellular No blastic activity som fibrosis ? MDS   She wishes she could take a shot to improve her platelets    ROS: Denies fever, malais, weight loss, blurry vision, decreased visual acuity, cough, sputum, SOB, hemoptysis, pleuritic pain, palpitaitons, heartburn, abdominal pain, melena, lower extremity edema, claudication, or rash.  All other systems reviewed and negative  General: BP 116/76   Pulse 72   Ht 5\' 6"  (1.676 m)   Wt 86.6 kg   SpO2 97%   BMI 30.83 kg/m  Affect appropriate Healthy:  appears stated age 65: normal Neck supple with no adenopathy JVP normal no bruits no thyromegaly Lungs clear with no wheezing and good diaphragmatic motion Heart:  S1/S2 no murmur, no rub, gallop or click PMI normal Abdomen: benighn, BS positve, no tenderness, no AAA no bruit.  No HSM or HJR Distal pulses intact with no bruits No edema Neuro non-focal Skin some bruising left arm and hand  No muscular weakness     Current Outpatient Medications  Medication Sig Dispense Refill  .  amLODipine (NORVASC) 10 MG tablet Take 1 tablet (10 mg total) by mouth daily. 90 tablet 3  . B Complex Vitamins (B COMPLEX 100 PO)     . candesartan (ATACAND) 16 MG tablet TAKE 1 TABLET BY MOUTH DAILY 90 tablet 3  . carvedilol (COREG) 6.25 MG tablet TAKE 1 TABLET BY MOUTH TWICE DAILY WITH A MEAL 180 tablet 3  . Cholecalciferol (VITAMIN D3) 2000 units capsule Take 2,000 Units by mouth daily.    . diclofenac sodium (VOLTAREN) 1 % GEL Apply 4 g topically 4 (four) times daily as needed. 500 g 6  . furosemide (LASIX) 20 MG tablet TAKE 1/2 TABLET BY MOUTH DAILY 45 tablet 3  . hydrALAZINE (APRESOLINE) 25 MG tablet TAKE 1 TABLET BY MOUTH TWICE DAILY 180 tablet 3  . methylPREDNISolone (MEDROL DOSEPAK) 4 MG TBPK tablet Take as directed 21 tablet 0  . Multiple Vitamin (MULTIVITAMIN) capsule Take 1 capsule by mouth daily.     No current facility-administered medications for this visit.    Allergies  Sulfa antibiotics and Codeine  Electrocardiogram:   01/08/17 SR rate 65 normal 03/31/19 SR rate 70 normal   Assessment and Plan  HTN: Well controlled.  Continue current medications and low sodium Dash type diet.    Palpitations:  Continue beta blocker no serious arrhythmia's on monitor June 2020   DCM:  Euvolemic continue current meds EF normal by TTE 07/16/18 no need for [facility]  Bruising:  F/u hematology: Pancytopenia with WBC 2.8 Hct 31.5 and PLT 74 BM aspirate worrisome for early MDS f/u Dr Irene Limbo   F/U in a year   Jenkins Rouge

## 2020-03-29 ENCOUNTER — Encounter: Payer: Self-pay | Admitting: Cardiovascular Disease

## 2020-03-29 ENCOUNTER — Ambulatory Visit (INDEPENDENT_AMBULATORY_CARE_PROVIDER_SITE_OTHER): Payer: 59 | Admitting: Cardiovascular Disease

## 2020-03-29 ENCOUNTER — Other Ambulatory Visit: Payer: Self-pay

## 2020-03-29 VITALS — BP 116/76 | HR 72 | Ht 66.0 in | Wt 191.0 lb

## 2020-03-29 DIAGNOSIS — R002 Palpitations: Secondary | ICD-10-CM

## 2020-03-29 NOTE — Patient Instructions (Signed)

## 2020-04-17 ENCOUNTER — Ambulatory Visit: Payer: 59 | Admitting: Hematology

## 2020-04-17 ENCOUNTER — Other Ambulatory Visit: Payer: 59

## 2020-05-13 ENCOUNTER — Other Ambulatory Visit: Payer: Self-pay | Admitting: Cardiovascular Disease

## 2020-06-21 ENCOUNTER — Encounter: Payer: Self-pay | Admitting: Orthopaedic Surgery

## 2020-06-21 ENCOUNTER — Ambulatory Visit: Payer: Self-pay

## 2020-06-21 ENCOUNTER — Other Ambulatory Visit: Payer: Self-pay

## 2020-06-21 ENCOUNTER — Ambulatory Visit (INDEPENDENT_AMBULATORY_CARE_PROVIDER_SITE_OTHER): Payer: 59 | Admitting: Orthopaedic Surgery

## 2020-06-21 DIAGNOSIS — M25551 Pain in right hip: Secondary | ICD-10-CM | POA: Diagnosis not present

## 2020-06-21 DIAGNOSIS — M25552 Pain in left hip: Secondary | ICD-10-CM

## 2020-06-21 DIAGNOSIS — M25511 Pain in right shoulder: Secondary | ICD-10-CM

## 2020-06-21 MED ORDER — METHOCARBAMOL 500 MG PO TABS: 500.0000 mg | ORAL_TABLET | Freq: Two times a day (BID) | ORAL | 0 refills | Status: DC | PRN

## 2020-06-21 MED ORDER — PREDNISONE 5 MG (21) PO TBPK: ORAL_TABLET | ORAL | 0 refills | Status: DC

## 2020-06-21 NOTE — Progress Notes (Signed)
Office Visit Note   Patient: Karen Waller           Date of Birth: [birth year]           MRN: [id number] Visit Date: 06/21/2020              Requested by: Carol Ada, MD [place],  [place] [address] PCP: Carol Ada, MD   Assessment & Plan: Visit Diagnoses:  1. Bilateral hip pain   2. Acute pain of right shoulder     Plan: Impression is right-sided cervical spine radiculopathy and groin pain likely referred from her back but could potentially be from her hips as well.  We have discussed starting her on a steroid taper and muscle relaxers as well as a course of physical therapy.  Should her hip pain continue or worsen, she will follow-up with Dr. Junius Roads for ultrasound-guided cortisone injection.  Otherwise, follow-up with [place] as needed.  Follow-Up Instructions: Return if symptoms worsen or fail to improve.   Orders:  Orders Placed This Encounter  Procedures  . XR Cervical Spine 2 or 3 views  . XR Shoulder Right  . XR Pelvis 1-2 Views  . Ambulatory referral to Physical Therapy   Meds ordered this encounter  Medications  . methocarbamol (ROBAXIN) 500 MG tablet    Sig: Take 1 tablet (500 mg total) by mouth 2 (two) times daily as needed.    Dispense:  20 tablet    Refill:  0  . predniSONE (STERAPRED UNI-PAK 21 TAB) 5 MG (21) TBPK tablet    Sig: Take as directed    Dispense:  21 tablet    Refill:  0      Procedures: No procedures performed   Clinical Data: No additional findings.   Subjective: Chief Complaint  Patient presents with  . Neck - Pain  . Lower Back - Pain    HPI patient is a pleasant 65 year old female who comes in today with bilateral hip pain as well as right shoulder pain.  In regards to her hips, the pain is primarily worse on the right.  The pain is generally located to the groin and began about 1 week ago after starting water aerobics.  The pain is worse with walking or lifting her legs to put on close as well as  standing for a period of time.  She has been taking Tylenol without significant relief.  She denies any paresthesias to either lower extremity.  She does not have a history of hip pathology or previous injection.  She does have a history of back pain in the past.  In regards to her shoulder, the pain she has is to the top of the shoulder.  This began last night while trying to sleep.  She does note that she was doing a lot of repetitive and awkward lifting yesterday.  The pain is worse with turning her neck to the left as well as any movement of the shoulder.  She does note occasional paresthesias to her right lower extremity.  Review of Systems as detailed in HPI.  All others reviewed and are negative.   Objective: Vital Signs: There were no vitals taken for this visit.  Physical Exam well-developed well-nourished female no acute distress.  Alert and oriented x3.  Ortho Exam right shoulder exam reveals full active range of motion all planes.  Negative empty can and negative cross body adduction test.  No pain to the [facility] joint.  She  does have moderate tenderness to the right parascapular region.  No spinous tenderness.  She does have increased pain with rotation to the left.  No focal weakness.  She is neurovascular intact distally.  Specialty Comments:  No specialty comments available.  Imaging: XR Cervical Spine 2 or 3 views  Result Date: 06/21/2020 Mild multilevel degenerative changes were seen 4-5 and C5-6  XR Pelvis 1-2 Views  Result Date: 06/21/2020 Mild degenerative changes  XR Shoulder Right  Result Date: 06/21/2020 No acute or structural abnormalities.    PMFS History: Patient Active Problem List   Diagnosis Date Noted  . Iron deficiency anemia 06/22/2019  . Pain in left hip 03/24/2018  . Bruising, spontaneous 07/06/2017  . Left Achilles tendinitis 11/08/2016  . Conductive hearing loss, bilateral 08/08/2016  . Elevated lipids 12/16/2012  . HTN (hypertension) 01/05/2011  .  CHF (congestive heart failure) ([place]) 10/30/2010  . Edema 10/30/2010  . Gouty arthropathy 03/17/2008  . OVERWEIGHT 03/17/2008  . ESSENTIAL HYPERTENSION, BENIGN 03/17/2008  . Non-ischemic cardiomyopathy ([place]) 03/17/2008  . PALPITATIONS, OCCASIONAL 03/17/2008   Past Medical History:  Diagnosis Date  . Cardiomyopathy    EF 30-35% diagnosed 02/2008  . Central obesity   . Gout    left knee  . HTN (hypertension)   . Pancytopenia ([place])     Family History  Problem Relation Age of Onset  . Hypertension Mother   . Breast cancer Neg Hx     Past Surgical History:  Procedure Laterality Date  . KNEE SURGERY    . TONSILLECTOMY     Social History   Occupational History  . Occupation: [employer]  Tobacco Use  . Smoking status: Former Smoker    Quit date: 02/19/1984    Years since quitting: 36.3  . Smokeless tobacco: Never Used  Vaping Use  . Vaping Use: Never used  Substance and Sexual Activity  . Alcohol use: Not Currently  . Drug use: Never  . Sexual activity: Not on file

## 2020-07-10 ENCOUNTER — Other Ambulatory Visit: Payer: Self-pay | Admitting: Cardiovascular Disease

## 2020-07-12 ENCOUNTER — Other Ambulatory Visit: Payer: Self-pay | Admitting: Cardiovascular Disease

## 2020-07-14 ENCOUNTER — Other Ambulatory Visit: Payer: Self-pay

## 2020-07-14 ENCOUNTER — Ambulatory Visit: Payer: 59 | Attending: Physician Assistant

## 2020-07-14 ENCOUNTER — Emergency Department (HOSPITAL_COMMUNITY): Payer: 59

## 2020-07-14 ENCOUNTER — Ambulatory Visit: Payer: 59

## 2020-07-14 ENCOUNTER — Emergency Department (HOSPITAL_COMMUNITY)
Admission: EM | Admit: 2020-07-14 | Discharge: 2020-07-14 | Disposition: A | Discharge status: Home / Self Care | Payer: 59 | Attending: Emergency Medicine | Admitting: Emergency Medicine

## 2020-07-14 DIAGNOSIS — X500XXA Overexertion from strenuous movement or load, initial encounter: Secondary | ICD-10-CM | POA: Insufficient documentation

## 2020-07-14 DIAGNOSIS — Y93F2 Activity, caregiving, lifting: Secondary | ICD-10-CM | POA: Insufficient documentation

## 2020-07-14 DIAGNOSIS — Y9263 Factory as the place of occurrence of the external cause: Secondary | ICD-10-CM | POA: Diagnosis not present

## 2020-07-14 DIAGNOSIS — I509 Heart failure, unspecified: Secondary | ICD-10-CM | POA: Diagnosis not present

## 2020-07-14 DIAGNOSIS — Y990 Civilian activity done for income or pay: Secondary | ICD-10-CM | POA: Diagnosis not present

## 2020-07-14 DIAGNOSIS — I110 Hypertensive heart disease with heart failure: Secondary | ICD-10-CM | POA: Insufficient documentation

## 2020-07-14 DIAGNOSIS — M545 Low back pain, unspecified: Secondary | ICD-10-CM | POA: Insufficient documentation

## 2020-07-14 DIAGNOSIS — Z79899 Other long term (current) drug therapy: Secondary | ICD-10-CM | POA: Insufficient documentation

## 2020-07-14 DIAGNOSIS — M25551 Pain in right hip: Secondary | ICD-10-CM | POA: Insufficient documentation

## 2020-07-14 DIAGNOSIS — M5442 Lumbago with sciatica, left side: Secondary | ICD-10-CM | POA: Diagnosis not present

## 2020-07-14 DIAGNOSIS — Z87891 Personal history of nicotine dependence: Secondary | ICD-10-CM | POA: Diagnosis not present

## 2020-07-14 DIAGNOSIS — G8929 Other chronic pain: Secondary | ICD-10-CM | POA: Insufficient documentation

## 2020-07-14 DIAGNOSIS — M25552 Pain in left hip: Secondary | ICD-10-CM | POA: Insufficient documentation

## 2020-07-14 DIAGNOSIS — M5441 Lumbago with sciatica, right side: Secondary | ICD-10-CM | POA: Insufficient documentation

## 2020-07-14 NOTE — Discharge Instructions (Addendum)
Call your primary care doctor or specialist as discussed in the next 2-3 days.   Return immediately back to the ER if:  Your symptoms worsen within the next 12-24 hours. You develop new symptoms such as new fevers, persistent vomiting, new pain, shortness of breath, or new weakness or numbness, or if you have any other concerns.  

## 2020-07-14 NOTE — Patient Instructions (Signed)
Pt has s/s concerning for potential cauda equina syndrome, most notably N/T and sharp shooting pain in the saddle region along with N/T in the RLE and Low back pain. She does not report change in bowel or bladder function, which lowers the concern. Out of due diligence, please evaluate pt to rule in or out the presence of cauda equina syndrome.  Vanessa [place], PT, SPT

## 2020-07-14 NOTE — Therapy (Addendum)
[place], [place], [address] Phone: [phone]   Fax:  [phone]  Physical Therapy Evaluation/ Discharge Summary  Patient Details  Name: Karen Waller MRN: [id number] Date of Birth: 1956/01/01 Referring Provider (PT): Aundra Dubin, [place]   Encounter Date: 07/14/2020   PT End of Session - 07/14/20 0907     Visit Number 1    PT Start Time 0825             Past Medical History:  Diagnosis Date   Cardiomyopathy    EF 30-35% diagnosed 02/2008   Central obesity    Gout    left knee   HTN (hypertension)    Pancytopenia ([place])     Past Surgical History:  Procedure Laterality Date   KNEE SURGERY     TONSILLECTOMY      There were no vitals filed for this visit.    Subjective Assessment - 07/14/20 0825     Subjective Pt reports primary c/o BIL anterior hip pain that started 2-3 months ago after lifting heavy machinery at work. She states that she altered her lifting technique due to having shoulder pain at the time. She states she was trying to lift with her legs.  She had an appointment with her orthopedic doctor 2 weeks ago, and her doctor indicated she thinks her pain may be associated with low back pathology. She states that heavy lifting and walking > 30 minutes exacerbate her pain and Tylenol and muscle relaxers improve her pain. She reports experiencing N/T down her R LE extending down her thigh, lower leg, and her foot; this occured for the first time this past Wednesday morning. These symptoms last for about 1 hour. She also reports experiencing these symptoms currently. She also states that she was doing spin classes in 2018-2019 but had to stop because of increased lumbar pain with associated N/T in the RLE. Pt reports tingling in her saddle region. She denies any bowel or bladder changes or incontinence. She denies any sharp or stabbing pain in the LE. Cancer screening negative.     Pertinent History HTN, arthritis, knee scope Rt, low back pain, bil hip bursitis    Limitations Walking;Lifting    How long can you sit comfortably? Unlimited    How long can you stand comfortably? Unlimited    How long can you walk comfortably? 30 minutes    Diagnostic tests xrays - Moderate joint space narrowing with evidence of bilateral hip dysplasia     Currently in Pain? 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    Multiple Pain Sites Yes    Pain Score 5    Pain Location Back    Pain Orientation Medial;Lower    Pain Descriptors / Indicators Aching;Dull    Pain Type Chronic pain    Pain Onset More than a month ago    Pain Frequency Intermittent    Aggravating Factors  walking >1mnutes, lifting 15-20 pound cassettes at work.    Pain Relieving Factors Tylenol, muscle relaxers    Effect of Pain on Daily Activities Difficulty lifting objects at work                [facility]       Assessment   Medical Diagnosis Bilateral hip pain (M25.551, M25.552), Acute pain of right shoulder (M25.511)    Referring Provider (PT) SAundra Dubin PA-C    Onset Date/Surgical Date 04/16/20    Hand Dominance Right    Next MD Visit Not scheduled    Prior Therapy None      Precautions   Precautions None      Restrictions   Weight Bearing Restrictions No      Balance Screen   Has the patient fallen in the past 6 months No    Has the patient had a decrease in activity level because of a fear of falling?  No    Is the  patient reluctant to leave their home because of a fear of falling?  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                            Plan - 07/14/20 0902     Clinical Impression Statement Pt has s/s concerning for potential cauda equina syndrome, most notably N/T and sharp shooting pain in the saddle region "private parts" along with N/T in the RLE and low back pain. These s/s first occurred about 3 days ago, although she has had BIL hip pain for 2-3 months. She does not report changes in bowel or bladder function, which lowers the concern. However, due to acute nature and symptom presentation concerning for cauda equina, pt instructed to go immediately to the ED to rule in or out cauda equina syndrome.  Once cleared, plan to resume evaluation at a later date.             Patient will benefit from skilled therapeutic intervention in order to improve the following deficits and impairments:     Visit Diagnosis: Pain in left hip  Pain in right hip  Chronic midline low back pain, unspecified whether sciatica present     Problem List Patient Active Problem List   Diagnosis  Date Noted   Iron deficiency anemia 06/22/2019   Pain in left hip 03/24/2018  Bruising, spontaneous 07/06/2017   Left Achilles tendinitis 11/08/2016   Conductive hearing loss, bilateral 08/08/2016   Elevated lipids 12/16/2012   HTN (hypertension) 01/05/2011   CHF (congestive heart failure) ([facility]) 10/30/2010   Edema 10/30/2010   Gouty arthropathy 03/17/2008   OVERWEIGHT 03/17/2008   ESSENTIAL HYPERTENSION, BENIGN 03/17/2008   Non-ischemic cardiomyopathy ([place]) 03/17/2008   PALPITATIONS, OCCASIONAL 03/17/2008      [place], [place], [address] Phone: [phone]   Fax:  [phone]  Name: Karen Waller MRN: [id number] Date of Birth: Jul 19, 1955  PHYSICAL THERAPY DISCHARGE SUMMARY  Visits from Start of Care: 1  Current functional level related to goals / functional outcomes: See Evaluation note   Remaining deficits: See evaluation note   Education / Equipment: Pt provided an HEP.   Patient agrees to discharge. Patient goals were not met. Patient is being discharged due to not returning since the last visit.

## 2020-07-14 NOTE — ED Triage Notes (Signed)
65 yo female c/o low back pain radiating to groin and down right leg with associated numbness and tingling down right leg. Pt states she has had mild low back pain x2 months but it increased in severity yesterday status post doing heavy lifting at her job. Pt states she works in [employer]. Pt also states she was supposed to start therapy today for  hip and low back pain. During intake this  Morning pt was told to come to ER to rule out any other disease process prior to therapy start. Pt denies any urinary complaints, pt denies any bowel abnormalities. No other complaints at this time.

## 2020-07-14 NOTE — ED Provider Notes (Signed)
[facility] DEPT Provider Note   CSN: [id number] Arrival date & time: 07/14/20  9371     History Chief Complaint  Patient presents with  . Back Pain    Back pain radiating to groin and down right leg     Bea LACONYA CLERE is a 65 y.o. female.  Patient complaining of lower back pain and bilateral hip/pelvis pain.  Patient states the symptoms been going on for about 2 months.  They get exacerbated when she lifts heavy things for some time.  She was lifting something heavy at work and it exacerbated her pain.  She was scheduled to go to physical therapy.  When she went to physical therapy appointment, they advised her to get evaluated in the ER as well.  Patient denies any new falls.  No fever no cough no vomiting no diarrhea.  No new numbness or weakness.  No bowel or bladder dysfunction.  She currently has no pain while laying in the bed, she states her pain is brought on by any attempted heavy lifting.        Past Medical History:  Diagnosis Date  . Cardiomyopathy    EF 30-35% diagnosed 02/2008  . Central obesity   . Gout    left knee  . HTN (hypertension)   . Pancytopenia [facility])     Patient Active Problem List   Diagnosis Date Noted  . Iron deficiency anemia 06/22/2019  . Pain in left hip 03/24/2018  . Bruising, spontaneous 07/06/2017  . Left Achilles tendinitis 11/08/2016  . Conductive hearing loss, bilateral 08/08/2016  . Elevated lipids 12/16/2012  . HTN (hypertension) 01/05/2011  . CHF (congestive heart failure) ([place]) 10/30/2010  . Edema 10/30/2010  . Gouty arthropathy 03/17/2008  . OVERWEIGHT 03/17/2008  . ESSENTIAL HYPERTENSION, BENIGN 03/17/2008  . Non-ischemic cardiomyopathy ([place]) 03/17/2008  . PALPITATIONS, OCCASIONAL 03/17/2008    Past Surgical History:  Procedure Laterality Date  . KNEE SURGERY    . TONSILLECTOMY       OB History   No obstetric history on file.     Family History  Problem Relation Age of Onset  .  Hypertension Mother   . Breast cancer Neg Hx     Social History   Tobacco Use  . Smoking status: Former Smoker    Quit date: 02/19/1984    Years since quitting: 36.4  . Smokeless tobacco: Never Used  Vaping Use  . Vaping Use: Never used  Substance Use Topics  . Alcohol use: Not Currently  . Drug use: Never    Home Medications Prior to Admission medications   Medication Sig Start Date End Date Taking? Authorizing Provider  amLODipine (NORVASC) 10 MG tablet TAKE 1 TABLET(10 MG) BY MOUTH DAILY 07/12/20   Josue Hector, MD  B Complex Vitamins (B COMPLEX 100 PO)     [provider]  candesartan (ATACAND) 16 MG tablet TAKE 1 TABLET BY MOUTH DAILY 07/11/20   Josue Hector, MD  carvedilol (COREG) 6.25 MG tablet TAKE 1 TABLET BY MOUTH TWICE DAILY WITH A MEAL 07/12/20   Josue Hector, MD  Cholecalciferol (VITAMIN D3) 2000 units capsule Take 2,000 Units by mouth daily.    [provider]  diclofenac sodium (VOLTAREN) 1 % GEL Apply 4 g topically 4 (four) times daily as needed. 03/24/18   Hilts, Legrand Como, MD  furosemide (LASIX) 20 MG tablet TAKE 1/2 TABLET BY MOUTH DAILY 05/15/20   Josue Hector, MD  hydrALAZINE (APRESOLINE) 25 MG  tablet TAKE 1 TABLET BY MOUTH TWICE DAILY 07/12/20   Josue Hector, MD  methocarbamol (ROBAXIN) 500 MG tablet Take 1 tablet (500 mg total) by mouth 2 (two) times daily as needed. 06/21/20   Aundra Dubin, PA-C  methylPREDNISolone (MEDROL DOSEPAK) 4 MG TBPK tablet Take as directed 02/15/20   Aundra Dubin, PA-C  Multiple Vitamin (MULTIVITAMIN) capsule Take 1 capsule by mouth daily.    [provider]  predniSONE (STERAPRED UNI-PAK 21 TAB) 5 MG (21) TBPK tablet Take as directed 06/21/20   Aundra Dubin, PA-C  TIZANIDINE HCL PO Take by mouth. Unknown dosage    [provider]    Allergies    Sulfa antibiotics and Codeine  Review of Systems   Review of Systems  Constitutional: Negative for fever.  HENT: Negative for ear  pain.   Eyes: Negative for pain.  Respiratory: Negative for cough.   Cardiovascular: Negative for chest pain.  Gastrointestinal: Negative for abdominal pain.  Genitourinary: Negative for flank pain.  Musculoskeletal: Positive for back pain.  Skin: Negative for rash.  Neurological: Negative for headaches.    Physical Exam Updated Vital Signs BP 136/74   Pulse 76   Temp 97.8 F (36.6 C) (Oral)   Resp 16   Ht 5\' 6"  (1.676 m)   Wt 86.2 kg   SpO2 98%   BMI 30.67 kg/m   Physical Exam Constitutional:      General: She is not in acute distress.    Appearance: Normal appearance.  HENT:     Head: Normocephalic.     Nose: Nose normal.  Eyes:     Extraocular Movements: Extraocular movements intact.  Cardiovascular:     Rate and Rhythm: Normal rate.  Pulmonary:     Effort: Pulmonary effort is normal.  Musculoskeletal:        General: Normal range of motion.     Cervical back: Normal range of motion.     Comments: No C or T or L-spine midline step-offs or tenderness noted.    Neurological:     General: No focal deficit present.     Mental Status: She is alert and oriented to person, place, and time. Mental status is at baseline.     Cranial Nerves: No cranial nerve deficit.     Motor: No weakness.     Gait: Gait normal.     ED Results / Procedures / Treatments   Labs (all labs ordered are listed, but only abnormal results are displayed) Labs Reviewed - No data to display  EKG None  Radiology DG Pelvis 1-2 Views  Result Date: 07/14/2020 CLINICAL DATA:  Bilateral pelvic pain EXAM: PELVIS - 1-2 VIEW COMPARISON:  06/21/2020 FINDINGS: Chronic sclerosis and irregularity at the symphysis pubis. The hips are both located and intact. Symmetric sacroiliac joints. No acute fracture. Lumbar spine degeneration. IMPRESSION: No acute finding. Osteitis pubis. Electronically Signed   By: Monte Fantasia M.D.   On: 07/14/2020 11:15    Procedures Procedures   Medications Ordered  in ED Medications - No data to display  ED Course  I have reviewed the triage vital signs and the nursing notes.  Pertinent labs & imaging results that were available during my care of the patient were reviewed by me and considered in my medical decision making (see chart for details).    MDM Rules/Calculators/A&P  X-ray image of the pelvis is unremarkable.  Physical exam is benign.  Pulses are intact bilateral lower extremities which are both warm and well-perfused.  She is amatory without any pain or discomfort at this time.  Will be discharged home in stable condition, advised immediate return for worsening symptoms or any additional concerns, otherwise clinically continue with physical therapy.   Final Clinical Impression(s) / ED Diagnoses Final diagnoses:  Midline low back pain with bilateral sciatica, unspecified chronicity    Rx / DC Orders ED Discharge Orders    None       Luna Fuse, MD 07/14/20 1242

## 2020-08-04 ENCOUNTER — Other Ambulatory Visit: Payer: Self-pay | Admitting: Family Medicine

## 2020-08-04 DIAGNOSIS — R102 Pelvic and perineal pain: Secondary | ICD-10-CM

## 2020-08-04 DIAGNOSIS — R10819 Abdominal tenderness, unspecified site: Secondary | ICD-10-CM

## 2020-08-07 ENCOUNTER — Ambulatory Visit
Admission: RE | Admit: 2020-08-07 | Discharge: 2020-08-07 | Disposition: A | Discharge status: Home / Self Care | Payer: 59 | Source: Ambulatory Visit | Attending: Family Medicine | Admitting: Family Medicine

## 2020-08-07 DIAGNOSIS — R10819 Abdominal tenderness, unspecified site: Secondary | ICD-10-CM

## 2020-08-07 DIAGNOSIS — R102 Pelvic and perineal pain: Secondary | ICD-10-CM

## 2020-09-04 ENCOUNTER — Other Ambulatory Visit: Payer: Self-pay | Admitting: Cardiovascular Disease

## 2020-09-09 NOTE — Progress Notes (Signed)
HEMATOLOGY/ONCOLOGY CLINIC NOTE  Date of Service: 09/09/2020  Patient Care Team: Carol Ada, MD as PCP - General (Family Medicine) Josue Hector, MD as PCP - Cardiology (Cardiology)  CHIEF COMPLAINTS/PURPOSE OF CONSULTATION:  Spontaneous bruising  Pancytopenia  HISTORY OF PRESENTING ILLNESS:   Karen Waller is a wonderful 65 y.o. female who has been previously followed by my colleague Dr. Grace Isaac for evaluation and management of Spontaneous bruising. The pt reports that she is doing well overall.   The pt reports that she has continued to have spontaneous bruising, without raised lumps. She first noticed this in the last two years and takes Meloxicam and Amlodipine regularly, and aspirin very rarely. She also notes that she does not always bruise in response to bumping into objects.   She denies any GI bleeding, large muscle hematoma, bleeding into her joints. She notes that she did not have heavy periods in the past. She has had knee surgery and a tonsillectomy without bleeding complications.   Most recent lab results (06/17/17) of CBC w/diff and CMP is as follows: all values are WNL except for RDW at 17.0, PLT at 118k.  On review of systems, pt reports bruising easily, and denies fevers, chills, night sweats, abdominal pains, leg swelling, and any other symptoms.   On PMHx the pt reports tonsillectomy and knee surgery. On Family Hx the pt denies blood disorders, clotting disorders.  Interval History:   Karen Waller returns today for management and evaluation of easy bruisability and pancytopenia related to MDS. The patient's last visit with [place] was on 03/13/2020. The pt reports that she is doing well overall.  The pt reports no acute new symptoms currently.  She notes she has had some issues with perforation of her eardrum for which she is seeing ENT.  Also notes that she has had some issues with back pain with bilateral sciatica which she is following up with  her primary doctor for.  Had previously seen Dr. Johnsie Cancel in cardiology for palpitations.  Lab results today 09/11/2020 of CBC w/diff shows a hemoglobin of 9.9 which is quite stable over the last 6 months, WBC count of 2.6k with an ANC of 0.7k which is unchanged from last time.  Platelet counts of 80k up from 74k on the previous visit. 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  These are typically seen in mixed myelodysplastic/myeloproliferative neoplasm such as CMML  On review of systems, pt reports no significant fatigue, new infection issues or overt significant bleeding.  Still has easy bruisability.  MEDICAL HISTORY:  Past Medical History:  Diagnosis Date   Cardiomyopathy    EF 30-35% diagnosed 02/2008   Central obesity    Gout    left knee   HTN (hypertension)    Pancytopenia (HCC)     SURGICAL HISTORY: Past Surgical History:  Procedure Laterality Date   KNEE SURGERY     TONSILLECTOMY      SOCIAL HISTORY: Social History   Socioeconomic History   Marital status: Married    Spouse name: Not on file   Number of children: 2   Years of education: Not on file   Highest education level: Not on file  Occupational History   Occupation: [employer]  Tobacco Use   Smoking status: Former    Types: Cigarettes    Quit date: 02/19/1984    Years since quitting: 36.5   Smokeless tobacco: Never  Vaping Use  Vaping Use: Never used  Substance and Sexual Activity   Alcohol use: Not Currently   Drug use: Never   Sexual activity: Not on file  Other Topics Concern   Not on file  Social History Narrative   Not on file   Social Determinants of Health   Financial Resource Strain: Not on file  Food Insecurity: Not on file  Transportation Needs: Not on file  Physical Activity: Not on file  Stress: Not on file  Social Connections: Not on file  Intimate Partner Violence: Not on file    FAMILY HISTORY: Family History  Problem Relation  Age of Onset   Hypertension Mother    Breast cancer Neg Hx     ALLERGIES:  is allergic to sulfa antibiotics and codeine.  MEDICATIONS:  Current Outpatient Medications  Medication Sig Dispense Refill   amLODipine (NORVASC) 10 MG tablet TAKE 1 TABLET(10 MG) BY MOUTH DAILY 90 tablet 3   B Complex Vitamins (B COMPLEX 100 PO)      candesartan (ATACAND) 16 MG tablet TAKE 1 TABLET BY MOUTH DAILY 90 tablet 2   carvedilol (COREG) 6.25 MG tablet TAKE 1 TABLET BY MOUTH TWICE DAILY WITH A MEAL 180 tablet 3   Cholecalciferol (VITAMIN D3) 2000 units capsule Take 2,000 Units by mouth daily.     diclofenac sodium (VOLTAREN) 1 % GEL Apply 4 g topically 4 (four) times daily as needed. 500 g 6   furosemide (LASIX) 20 MG tablet TAKE 1/2 TABLET BY MOUTH DAILY 45 tablet 3   hydrALAZINE (APRESOLINE) 25 MG tablet TAKE 1 TABLET BY MOUTH TWICE DAILY 180 tablet 3   methocarbamol (ROBAXIN) 500 MG tablet Take 1 tablet (500 mg total) by mouth 2 (two) times daily as needed. 20 tablet 0   methylPREDNISolone (MEDROL DOSEPAK) 4 MG TBPK tablet Take as directed 21 tablet 0   Multiple Vitamin (MULTIVITAMIN) capsule Take 1 capsule by mouth daily.     predniSONE (STERAPRED UNI-PAK 21 TAB) 5 MG (21) TBPK tablet Take as directed 21 tablet 0   TIZANIDINE HCL PO Take by mouth. Unknown dosage     No current facility-administered medications for this visit.    REVIEW OF SYSTEMS:   10 Point review of Systems was done is negative except as noted above.   PHYSICAL EXAMINATION:  There were no vitals filed for this visit.  There were no vitals filed for this visit.  .There is no height or weight on file to calculate BMI.   NAD GENERAL:alert, in no acute distress and comfortable SKIN: no acute rashes, no significant lesions EYES: conjunctiva are pink and non-injected, sclera anicteric OROPHARYNX: MMM, no exudates, no oropharyngeal erythema or ulceration NECK: supple, no JVD LYMPH:  no palpable lymphadenopathy in the  cervical, axillary or inguinal regions LUNGS: clear to auscultation b/l with normal respiratory effort HEART: regular rate & rhythm ABDOMEN:  normoactive bowel sounds , non tender, not distended. Extremity: no pedal edema PSYCH: alert & oriented x 3 with fluent speech NEURO: no focal motor/sensory deficits   LABORATORY DATA:  I have reviewed the data as listed  . CBC Latest Ref Rng & Units 09/11/2020 09/11/2020 03/13/2020  WBC 4.0 - 10.5 K/uL 2.6(L) 2.5(L) 2.8(L)  Hemoglobin 12.0 - 15.0 g/dL 9.9(L) 9.8(L) 9.9(L)  Hematocrit 36.0 - 46.0 % 30.9(L) 30.8(L) 31.5(L)  Platelets 150 - 400 K/uL 80(L) PLATELET CLUMPS NOTED ON SMEAR, UNABLE TO ESTIMATE 74(L)    . CMP Latest Ref Rng & Units 09/11/2020 03/13/2020 10/18/2019  Glucose  70 - 99 mg/dL 180(H) 164(H) 89  BUN 8 - 23 mg/dL 25(H) 17 26(H)  Creatinine 0.44 - 1.00 mg/dL 1.17(H) 1.11(H) 1.00  Sodium 135 - 145 mmol/L 140 141 138  Potassium 3.5 - 5.1 mmol/L 3.9 3.8 4.0  Chloride 98 - 111 mmol/L 107 109 106  CO2 22 - 32 mmol/L '23 24 24  '$ Calcium 8.9 - 10.3 mg/dL 9.3 9.0 10.0  Total Protein 6.5 - 8.1 g/dL 7.3 7.1 7.6  Total Bilirubin 0.3 - 1.2 mg/dL 0.5 0.6 0.4  Alkaline Phos 38 - 126 U/L 74 75 79  AST 15 - 41 U/L 13(L) 15 18  ALT 0 - 44 U/L '14 13 22   '$ Component     Latest Ref Rng & Units 06/17/2017  Coagulation Factor VIII     57 - 163 % 128  Ristocetin Co-factor, Plasma     50 - 200 % 117  Von Willebrand Antigen, Plasma     50 - 200 % 126  Prothrombin Time     11.4 - 15.2 seconds 12.9  INR      0.98  PFA Interpretation        Collagen / Epinephrine     0 - 193 seconds 192  Fibrinogen     210 - 475 mg/dL 291  APTT     24 - 36 seconds 31  LDH     125 - 245 U/L 230   Component     Latest Ref Rng & Units 01/08/2018  WBC     4.0 - 10.5 K/uL 3.1 (L)  RBC     3.87 - 5.11 MIL/uL 3.71 (L)  Hemoglobin     12.0 - 15.0 g/dL 11.1 (L)  HCT     36.0 - 46.0 % 35.4 (L)  MCV     80.0 - 100.0 fL 95.4  MCH     26.0 - 34.0 pg 29.9   MCHC     30.0 - 36.0 g/dL 31.4  RDW     11.5 - 15.5 % 16.4 (H)  Platelets     150 - 400 K/uL 104 (L)  nRBC     0.0 - 0.2 % 0.0  Neutrophils     % 37  NEUT#     1.7 - 7.7 K/uL 1.2 (L)  Lymphocytes     % 51  Lymphocyte #     0.7 - 4.0 K/uL 1.6  Monocytes Relative     % 12  Monocyte #     0.1 - 1.0 K/uL 0.4  Eosinophil     % 0  Eosinophils Absolute     0.0 - 0.5 K/uL 0.0  Basophil     % 0  Basophils Absolute     0.0 - 0.1 K/uL 0.0  Immature Granulocytes     % 0  Abs Immature Granulocytes     0.00 - 0.07 K/uL 0.00  Sodium     135 - 145 mmol/L 145  Potassium     3.5 - 5.1 mmol/L 4.0  Chloride     98 - 111 mmol/L 111  CO2     22 - 32 mmol/L 25  Glucose     70 - 99 mg/dL 117 (H)  BUN     8 - 23 mg/dL 12  Creatinine     0.44 - 1.00 mg/dL 1.10 (H)  Calcium     8.9 - 10.3 mg/dL 9.3  Total Protein     6.5 - 8.1 g/dL  7.2  Albumin     3.5 - 5.0 g/dL 3.8  AST     15 - 41 U/L 15  ALT     0 - 44 U/L 13  Alkaline Phosphatase     38 - 126 U/L 85  Total Bilirubin     0.3 - 1.2 mg/dL 0.5  GFR, Est Non African American     >60 mL/min 53 (L)  GFR, Est African American     >60 mL/min >60  Anion gap     5 - 15 9  Prothrombin Time     11.4 - 15.2 seconds 12.7  INR      0.96  APTT     24 - 36 seconds 30  Fibrinogen     210 - 475 mg/dL 272   11/26/2018 BM Flow Pathology Report    11/26/2018 BM Bx Report    Surgical Pathology  CASE: [id number]  PATIENT: Tiane Sindelar  Bone Marrow Report    Clinical History: pancytopenia, paraproteinemia, right iliac bone      DIAGNOSIS:   BONE MARROW, ASPIRATE, CLOT, CORE:  -Hypercellular bone marrow for age with trilineage hematopoiesis and  fibrosis.  See comment   PERIPHERAL BLOOD:  -Pancytopenia   COMMENT:   The aspirate, touch imprints, and clot sections are suboptimal which  hinders cytomorphologic evaluation.  The core biopsy is optimal and  shows a hypercellular bone marrow (90%) with a  mixture of myeloid cell  lines.  This is associated with reticulin fibrosis.  A significant  CD34-positive blast population is not seen.  The myeloid changes are not  considered specific, and hence, it is unclear whether the findings  represent a primary myeloid neoplasm or represent secondary changes.  Correlation with cytogenetic studies is recommended.  There is no  evidence of lymphoproliferative process or diagnostic plasma cell  neoplasm.    11/26/2018 BM Cytogenetics:    RADIOGRAPHIC STUDIES: I have personally reviewed the radiological images as listed and agreed with the findings in the report. No results found.   ASSESSMENT & PLAN:   65 y.o. female with  1. Minor Spontaneous?/undetected trauma causing bruising No ecchymosis with bruising  Clotting parameters normal from 06/17/17 with normal PT, aPTT and VWD panel and platelet function testing 01/08/18 PT and aPTT, fibrinogen WNL  2. Thrombocytopenia Pt's PLT in 10/19/14 were normal at 215k when she was having abnormal bruising Now with mild thrombocytopenia  11/16/2018 US Abdomen ([id number]) revealed "Cholelithiasis. No evidence for acute abnormality." 11/26/2018 BM Bx [id number]) revealed "-Hypercellular bone marrow for age with trilineage hematopoiesis and fibrosis. Pancytopenia" 11/26/2018 BM Flow Pathology Report [id number]) revealed  "-No significant blastic population identified. 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 Pancytopenia -- BM Bx - hypercellular bone marrow with some reticulin fibrosis. 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 EPO levels not significantly elevated and might be  useful.(19.6) -Recommend pt stay up-to-date with annual Flu vaccine, Pneumovax vaccines, and Shingrix vaccine.  -Recommend use of pressure sleeves if engaging in activity that makes swinging arm motion. -Will continue with supportive treatment at this time. -Continue Vitamin B-complex daily. -We discussed referral to Dr. Terrace Arabia for transplant consideration. -Discussed and placed referral for LV showed  FOLLOW UP:  Additional labs today RTC with Dr Irene Limbo with labs in 6 months Referral to Terrace Arabia for MDS transplant consideration Evusheld referral    The total time spent in the appointment was 30 minutes and more than 50% was on counseling and direct patient cares.   All of the patient's questions were answered with apparent satisfaction. The patient knows to call the clinic with any problems, questions or concerns.    Sullivan Lone MD [place] AAHIVMS [facility] Hematology/Oncology Physician [facility]  (Office):       [phone] (Work cell):  [phone] (Fax):           [phone]  09/09/2020 12:02 PM  I, Reinaldo Raddle, am acting as scribe for Dr. Sullivan Lone, MD.   .I have reviewed the above documentation for accuracy and completeness, and I agree with the above. Brunetta Genera MD

## 2020-09-11 ENCOUNTER — Other Ambulatory Visit: Payer: Self-pay

## 2020-09-11 ENCOUNTER — Inpatient Hospital Stay (HOSPITAL_BASED_OUTPATIENT_CLINIC_OR_DEPARTMENT_OTHER): Payer: 59 | Admitting: Hematology

## 2020-09-11 ENCOUNTER — Inpatient Hospital Stay: Payer: 59 | Attending: Hematology

## 2020-09-11 VITALS — BP 137/74 | HR 67 | Temp 98.2°F | Resp 17 | Wt 195.8 lb

## 2020-09-11 DIAGNOSIS — D469 Myelodysplastic syndrome, unspecified: Secondary | ICD-10-CM

## 2020-09-11 DIAGNOSIS — D72819 Decreased white blood cell count, unspecified: Secondary | ICD-10-CM | POA: Insufficient documentation

## 2020-09-11 DIAGNOSIS — D696 Thrombocytopenia, unspecified: Secondary | ICD-10-CM | POA: Insufficient documentation

## 2020-09-11 DIAGNOSIS — D61818 Other pancytopenia: Secondary | ICD-10-CM

## 2020-09-11 LAB — CMP (CANCER CENTER ONLY)
ALT: 14 U/L (ref 0–44)
AST: 13 U/L — ABNORMAL LOW (ref 15–41)
Albumin: 3.9 g/dL (ref 3.5–5.0)
Alkaline Phosphatase: 74 U/L (ref 38–126)
Anion gap: 10 (ref 5–15)
BUN: 25 mg/dL — ABNORMAL HIGH (ref 8–23)
CO2: 23 mmol/L (ref 22–32)
Calcium: 9.3 mg/dL (ref 8.9–10.3)
Chloride: 107 mmol/L (ref 98–111)
Creatinine: 1.17 mg/dL — ABNORMAL HIGH (ref 0.44–1.00)
GFR, Estimated: 52 mL/min — ABNORMAL LOW (ref >60)
Glucose, Bld: 180 mg/dL — ABNORMAL HIGH (ref 70–99)
Potassium: 3.9 mmol/L (ref 3.5–5.1)
Sodium: 140 mmol/L (ref 135–145)
Total Bilirubin: 0.5 mg/dL (ref 0.3–1.2)
Total Protein: 7.3 g/dL (ref 6.5–8.1)

## 2020-09-11 LAB — CBC WITH DIFFERENTIAL (CANCER CENTER ONLY)
Abs Immature Granulocytes: 0.02 10*3/uL (ref 0.00–0.07)
Basophils Absolute: 0 10*3/uL (ref 0.0–0.1)
Basophils Relative: 0 %
Eosinophils Absolute: 0 10*3/uL (ref 0.0–0.5)
Eosinophils Relative: 0 %
HCT: 30.9 % — ABNORMAL LOW (ref 36.0–46.0)
Hemoglobin: 9.9 g/dL — ABNORMAL LOW (ref 12.0–15.0)
Immature Granulocytes: 1 %
Lymphocytes Relative: 57 %
Lymphs Abs: 1.5 10*3/uL (ref 0.7–4.0)
MCH: 30.6 pg (ref 26.0–34.0)
MCHC: 32 g/dL (ref 30.0–36.0)
MCV: 95.4 fL (ref 80.0–100.0)
Monocytes Absolute: 0.4 10*3/uL (ref 0.1–1.0)
Monocytes Relative: 15 %
Neutro Abs: 0.7 10*3/uL — ABNORMAL LOW (ref 1.7–7.7)
Neutrophils Relative %: 27 %
Platelet Count: 80 10*3/uL — ABNORMAL LOW (ref 150–400)
RBC: 3.24 MIL/uL — ABNORMAL LOW (ref 3.87–5.11)
RDW: 17.6 % — ABNORMAL HIGH (ref 11.5–15.5)
WBC Count: 2.6 10*3/uL — ABNORMAL LOW (ref 4.0–10.5)
nRBC: 1.2 % — ABNORMAL HIGH (ref 0.0–0.2)

## 2020-09-11 LAB — PLATELET BY CITRATE

## 2020-09-11 LAB — CBC WITH DIFFERENTIAL/PLATELET
Abs Immature Granulocytes: 0.01 10*3/uL (ref 0.00–0.07)
Basophils Absolute: 0 10*3/uL (ref 0.0–0.1)
Basophils Relative: 0 %
Eosinophils Absolute: 0.1 10*3/uL (ref 0.0–0.5)
Eosinophils Relative: 3 %
HCT: 30.8 % — ABNORMAL LOW (ref 36.0–46.0)
Hemoglobin: 9.8 g/dL — ABNORMAL LOW (ref 12.0–15.0)
Immature Granulocytes: 0 %
Lymphocytes Relative: 60 %
Lymphs Abs: 1.5 10*3/uL (ref 0.7–4.0)
MCH: 30.2 pg (ref 26.0–34.0)
MCHC: 31.8 g/dL (ref 30.0–36.0)
MCV: 95.1 fL (ref 80.0–100.0)
Monocytes Absolute: 0.3 10*3/uL (ref 0.1–1.0)
Monocytes Relative: 10 %
Neutro Abs: 0.7 10*3/uL — ABNORMAL LOW (ref 1.7–7.7)
Neutrophils Relative %: 27 %
Platelets: UNDETERMINED 10*3/uL (ref 150–400)
RBC: 3.24 MIL/uL — ABNORMAL LOW (ref 3.87–5.11)
RDW: 17.3 % — ABNORMAL HIGH (ref 11.5–15.5)
WBC: 2.5 10*3/uL — ABNORMAL LOW (ref 4.0–10.5)
nRBC: 1.2 % — ABNORMAL HIGH (ref 0.0–0.2)

## 2020-09-11 LAB — LACTATE DEHYDROGENASE: LDH: 196 U/L — ABNORMAL HIGH (ref 98–192)

## 2020-09-11 LAB — FERRITIN: Ferritin: 78 ng/mL (ref 11–307)

## 2020-09-11 LAB — IMMATURE PLATELET FRACTION: Immature Platelet Fraction: 16.7 % — ABNORMAL HIGH (ref 1.2–8.6)

## 2020-09-11 LAB — VITAMIN B12: Vitamin B-12: 3177 pg/mL — ABNORMAL HIGH (ref 180–914)

## 2020-09-12 ENCOUNTER — Telehealth: Payer: Self-pay | Admitting: Hematology

## 2020-09-12 LAB — ERYTHROPOIETIN: Erythropoietin: 19.6 m[IU]/mL — ABNORMAL HIGH (ref 2.6–18.5)

## 2020-09-12 NOTE — Telephone Encounter (Signed)
Left message with follow-up appointment per 7/25 los. 

## 2020-09-17 ENCOUNTER — Encounter: Payer: Self-pay | Admitting: Hematology

## 2020-09-17 MED ORDER — DIPHENHYDRAMINE HCL 50 MG/ML IJ SOLN: 50.0000 mg | Freq: Once | INTRAMUSCULAR | Status: DC | PRN

## 2020-09-17 MED ORDER — EPINEPHRINE 0.3 MG/0.3ML IJ SOAJ: 0.3000 mg | Freq: Once | INTRAMUSCULAR | Status: AC | PRN

## 2020-09-17 MED ORDER — ALBUTEROL SULFATE HFA 108 (90 BASE) MCG/ACT IN AERS: 2.0000 | INHALATION_SPRAY | Freq: Once | RESPIRATORY_TRACT | Status: AC | PRN

## 2020-09-17 MED ORDER — TIXAGEVIMAB (PART OF EVUSHELD) INJECTION: 300.0000 mg | Freq: Once | INTRAMUSCULAR | Status: DC

## 2020-09-17 MED ORDER — METHYLPREDNISOLONE SODIUM SUCC 125 MG IJ SOLR: 125.0000 mg | Freq: Once | INTRAMUSCULAR | Status: AC | PRN

## 2020-09-17 MED ORDER — CILGAVIMAB (PART OF EVUSHELD) INJECTION: 300.0000 mg | Freq: Once | INTRAMUSCULAR | Status: DC

## 2020-09-21 LAB — JAK2 (INCLUDING V617F AND EXON 12), MPL,& CALR-NEXT GEN SEQ

## 2020-09-27 ENCOUNTER — Telehealth: Payer: Self-pay | Admitting: Hematology

## 2020-09-27 NOTE — Telephone Encounter (Signed)
Called pt to sch appt per 8/10 sch msg. Pt stated she was unaware appt for the evusheld injection was to be scheduled. She asked to speak to the nurse, I transferred her to desk nurse.

## 2020-09-27 NOTE — Telephone Encounter (Signed)
Scheduled appt per 8/10 sch msg. Pt aware.  

## 2020-09-27 NOTE — Progress Notes (Signed)
Referral faxed per Dr Irene Limbo to [facility]/ Dr Berton Bon fax # [phone]

## 2020-09-27 NOTE — Progress Notes (Signed)
Per Dr Irene Limbo Karen Waller needs to be scheduled for Evusheld, if she wants. She was called by scheduling but did not have a good understanding of actually what Evusheld is. Educated Karen Waller on the process and evusheld. Karen Waller verbalized understanding. Karen Waller is  now  ready to get scheduled.  Marland Kitchen

## 2020-10-13 ENCOUNTER — Inpatient Hospital Stay: Payer: 59 | Attending: Hematology

## 2020-10-13 ENCOUNTER — Other Ambulatory Visit: Payer: Self-pay

## 2020-10-13 ENCOUNTER — Other Ambulatory Visit: Payer: Self-pay | Admitting: Hematology

## 2020-10-13 VITALS — BP 121/69 | HR 61 | Temp 98.2°F | Resp 16

## 2020-10-13 DIAGNOSIS — D61818 Other pancytopenia: Secondary | ICD-10-CM

## 2020-10-13 DIAGNOSIS — Z298 Encounter for other specified prophylactic measures: Secondary | ICD-10-CM | POA: Insufficient documentation

## 2020-10-13 DIAGNOSIS — D469 Myelodysplastic syndrome, unspecified: Secondary | ICD-10-CM | POA: Diagnosis present

## 2020-10-13 MED ORDER — CILGAVIMAB (PART OF EVUSHELD) INJECTION
300.0000 mg | Freq: Once | INTRAMUSCULAR | Status: AC
Start: 2020-10-13 — End: 2020-10-13
  Administered 2020-10-13: 300 mg via INTRAMUSCULAR
  Filled 2020-10-13: qty 3

## 2020-10-13 MED ORDER — TIXAGEVIMAB & CILGAVIMAB 150 & 150 MG/1.5ML IM SOLN
300.0000 mg | Freq: Once | INTRAMUSCULAR | Status: DC
  Filled 2020-10-13: qty 2

## 2020-10-13 MED ORDER — TIXAGEVIMAB (PART OF EVUSHELD) INJECTION
300.0000 mg | Freq: Once | INTRAMUSCULAR | Status: AC
  Administered 2020-10-13: 300 mg via INTRAMUSCULAR
  Filled 2020-10-13: qty 3

## 2020-10-13 NOTE — Patient Instructions (Signed)
Tixagevimab; Cilgavimab Solutions for Injection What is this medication? TIXAGEVIMAB; CILGAVIMAB (tix a jev i mab; sil gav i mab) reduces the risk of getting COVID-19 in persons with immune system problems who may not respond properly to the COVID-19 vaccine or persons who can't receive the COVID-19 vaccine. It belongs to a group of medications called monoclonal antibodies. It may decrease the risk of developing severe symptoms of COVID-19. It may also decrease the chance of going to the hospital. This medication is not approved by the FDA. The FDA has authorized emergency use of this medication during the COVID-19 pandemic. This medicine may be used for other purposes; ask your health care provider or pharmacist if you have questions. COMMON BRAND NAME(S): EVUSHELD What should I tell my care team before I take this medication? They need to know if you have any of these conditions: any allergies any serious illness bleeding disorder have received a COVID-19 vaccine heart disease an unusual or allergic reaction to tixagevimab, cilgavimab, other medications, foods, dyes, or preservatives pregnant or trying to get pregnant breast-feeding How should I use this medication? This medication is injected into a muscle. It is given by your care team in a hospital or clinic setting. Talk to your care team about the use of this medication in children. While it may be given to children as young as 12 years, precautions do apply. Overdosage: If you think you have taken too much of this medicine contact a poison control center or emergency room at once. NOTE: This medicine is only for you. Do not share this medicine with others. What if I miss a dose? Keep appointments for follow-up doses. It is important not to miss your dose. Call your care team if you are unable to keep an appointment. What may interact with this medication? COVID-19 vaccines This list may not describe all possible interactions. Give  your health care provider a list of all the medicines, herbs, non-prescription drugs, or dietary supplements you use. Also tell them if you smoke, drink alcohol, or use illegal drugs. Some items may interact with your medicine. What should I watch for while using this medication? Your condition will be monitored carefully while you are receiving this medication. Visit your care team for regular checks on your progress. Tell your care team if your symptoms do not start to get better or if they get worse. After receiving this medication, wait at least 14 days before getting the COVID-19 vaccine. What side effects may I notice from receiving this medication? Side effects that you should report to your care team as soon as possible: Allergic reactions-skin rash, itching, hives, swelling of the face, lips, tongue, or throat Heart attack-pain or tightness in the chest, shoulders, arms, or jaw, nausea, shortness of breath, cold or clammy skin, feeling faint or lightheaded Heart failure-shortness of breath, swelling of the ankles, feet, or hands, sudden weight gain, unusual weakness or fatigue Heart rhythm changes-fast or irregular heartbeat, dizziness, feeling faint or lightheaded, chest pain, trouble breathing Side effects that usually do not require medical attention (report these to your care team if they continue or are bothersome): Cough Fatigue Headache Pain, redness, or irritation at site where injected This list may not describe all possible side effects. Call your doctor for medical advice about side effects. You may report side effects to FDA at 1-800-FDA-1088. Where should I keep my medication? This medication is given in a hospital or clinic. It will not be stored at home. NOTE: This sheet is  a summary. It may not cover all possible information. If you have questions about this medicine, talk to your doctor, pharmacist, or health care provider.  2022 Elsevier/Gold Standard (2020-01-27  16:22:16)

## 2020-10-13 NOTE — Progress Notes (Signed)
Verbal order for evushield from Dr. Irene Limbo received.

## 2020-10-13 NOTE — Progress Notes (Signed)
Patient was monitored for 1 hour post evusheld injection with no adverse reactions. Vital signs stable and patient in no distress upon leaving infusion clinic.

## 2020-12-20 ENCOUNTER — Other Ambulatory Visit: Payer: Self-pay

## 2020-12-20 ENCOUNTER — Ambulatory Visit (INDEPENDENT_AMBULATORY_CARE_PROVIDER_SITE_OTHER): Payer: 59 | Admitting: Orthopaedic Surgery

## 2020-12-20 ENCOUNTER — Encounter: Payer: Self-pay | Admitting: Orthopaedic Surgery

## 2020-12-20 DIAGNOSIS — G8929 Other chronic pain: Secondary | ICD-10-CM

## 2020-12-20 DIAGNOSIS — M25511 Pain in right shoulder: Secondary | ICD-10-CM

## 2020-12-20 NOTE — Progress Notes (Signed)
   Office Visit Note   Patient: Karen Waller           Date of Birth: 10-Apr-1955           MRN: [id number] Visit Date: 12/20/2020              Requested by: Carol Ada, MD [address],  [place] [id number] PCP: Carol Ada, MD   Assessment & Plan: Visit Diagnoses:  1. Chronic right shoulder pain     Plan: Impression is chronic right shoulder pain without relief from conservative management.  I am concerned that she has a SLAP tear as well as some rotator cuff tendinopathy.  At this point we will obtain an MR arthrogram to assess for the structural abnormalities.  Follow-up after the MRI.  Follow-Up Instructions: No follow-ups on file.   Orders:  No orders of the defined types were placed in this encounter.  No orders of the defined types were placed in this encounter.     Procedures: No procedures performed   Clinical Data: No additional findings.   Subjective: Chief Complaint  Patient presents with   Right Shoulder - Pain    HPI  Karen Waller comes in today for chronic right shoulder pain with recent worsening.  Overall we have been seeing her for the last 6 months for chronic right shoulder pain.  This has gotten slightly better by not lifting at work.  Review of Systems   Objective: Vital Signs: There were no vitals taken for this visit.  Physical Exam  Ortho Exam  Right shoulder shows full range of motion with mild pain.  Positive Hawkins and Neer.  Rotator cuff strength is generally intact with some slight weakness mainly from pain.  She has fairly positive O'Brien sign.  Specialty Comments:  No specialty comments available.  Imaging: No results found.   PMFS History: Patient Active Problem List   Diagnosis Date Noted   Iron deficiency anemia 06/22/2019   Pain in left hip 03/24/2018   Bruising, spontaneous 07/06/2017   Left Achilles tendinitis 11/08/2016   Conductive hearing loss, bilateral 08/08/2016   Elevated  lipids 12/16/2012   HTN (hypertension) 01/05/2011   CHF (congestive heart failure) ([place]) 10/30/2010   Edema 10/30/2010   Gouty arthropathy 03/17/2008   OVERWEIGHT 03/17/2008   ESSENTIAL HYPERTENSION, BENIGN 03/17/2008   Non-ischemic cardiomyopathy ([place]) 03/17/2008   PALPITATIONS, OCCASIONAL 03/17/2008   Past Medical History:  Diagnosis Date   Cardiomyopathy    EF 30-35% diagnosed 02/2008   Central obesity    Gout    left knee   HTN (hypertension)    Pancytopenia ([place])     Family History  Problem Relation Age of Onset   Hypertension Mother    Breast cancer Neg Hx     Past Surgical History:  Procedure Laterality Date   KNEE SURGERY     TONSILLECTOMY     Social History   Occupational History   Occupation: [employer]  Tobacco Use   Smoking status: Former    Types: Cigarettes    Quit date: 02/19/1984    Years since quitting: 36.8   Smokeless tobacco: Never  Vaping Use   Vaping Use: Never used  Substance and Sexual Activity   Alcohol use: Not Currently   Drug use: Never   Sexual activity: Not on file

## 2021-01-15 ENCOUNTER — Other Ambulatory Visit: Payer: 59

## 2021-01-15 ENCOUNTER — Inpatient Hospital Stay: Admission: RE | Admit: 2021-01-15 | Payer: 59 | Source: Ambulatory Visit

## 2021-01-18 ENCOUNTER — Telehealth: Payer: Self-pay | Admitting: Cardiovascular Disease

## 2021-01-18 ENCOUNTER — Ambulatory Visit: Payer: 59 | Admitting: Orthopaedic Surgery

## 2021-01-18 DIAGNOSIS — R55 Syncope and collapse: Secondary | ICD-10-CM

## 2021-01-18 NOTE — Telephone Encounter (Signed)
I spoke with patient. She reports she was on a cruise last Saturday and passed out.  Had not eaten that day but blood sugar was elevated. She was given IV fluids and EKG was done. EKG was OK.  Patient was diagnosed with Covid at that time. She was seen at [facility] walk in clinic yesterday due to hearing whooshing sound in her ears and feeling heart beat in her ears. EKG was OK and she was instructed to follow up with cardiology.  She was prescribed Paxlovid yesterday.  Today she feels like she has a head cold. Swooshing in her ears is improving. I told patient I would make Dr Johnsie Cancel aware and we would let her know if testing needed and when he would like to see her in the office.

## 2021-01-18 NOTE — Telephone Encounter (Signed)
Josue Hector, MD to Me     11:17 AM Does not sound cardiac can order echo f/u for syncope and see me or PA next available   Patient notified. Echo ordered and patient scheduled to see Ermalinda Barrios, PA on April 18, 2021 at 9:15.

## 2021-01-18 NOTE — Telephone Encounter (Signed)
  Pt c/o Syncope: STAT if syncope occurred within 30 minutes and pt complains of lightheadedness High Priority if episode of passing out, completely, today or in last 24 hours   Did you pass out today? No   When is the last time you passed out? Last Saturday  Has this occurred multiple times? No   Did you have any symptoms prior to passing out? Lightheaded and dizziness   Pt said last Saturday while she was in vacation she passed out, since she got home this week she's not been feeling well, she can hear whooshing sound on her ears can hear her heart beat and feel it on her head. She went to a walk in clinic yesterday and they did EKG, was told her EKG was fine but recommended her to see her heart doctor.

## 2021-01-24 ENCOUNTER — Other Ambulatory Visit: Payer: Self-pay

## 2021-01-24 ENCOUNTER — Emergency Department (HOSPITAL_COMMUNITY)
Admission: EM | Admit: 2021-01-24 | Discharge: 2021-01-24 | Disposition: A | Discharge status: Home / Self Care | Payer: 59 | Attending: Emergency Medicine | Admitting: Emergency Medicine

## 2021-01-24 ENCOUNTER — Emergency Department (HOSPITAL_BASED_OUTPATIENT_CLINIC_OR_DEPARTMENT_OTHER): Payer: 59

## 2021-01-24 ENCOUNTER — Emergency Department (HOSPITAL_COMMUNITY): Payer: 59

## 2021-01-24 ENCOUNTER — Encounter (HOSPITAL_COMMUNITY): Payer: Self-pay

## 2021-01-24 DIAGNOSIS — M79605 Pain in left leg: Secondary | ICD-10-CM

## 2021-01-24 DIAGNOSIS — Z87891 Personal history of nicotine dependence: Secondary | ICD-10-CM | POA: Diagnosis not present

## 2021-01-24 DIAGNOSIS — Z79899 Other long term (current) drug therapy: Secondary | ICD-10-CM | POA: Insufficient documentation

## 2021-01-24 DIAGNOSIS — M7989 Other specified soft tissue disorders: Secondary | ICD-10-CM | POA: Diagnosis not present

## 2021-01-24 DIAGNOSIS — M109 Gout, unspecified: Secondary | ICD-10-CM | POA: Insufficient documentation

## 2021-01-24 DIAGNOSIS — Z8616 Personal history of COVID-19: Secondary | ICD-10-CM | POA: Insufficient documentation

## 2021-01-24 DIAGNOSIS — I110 Hypertensive heart disease with heart failure: Secondary | ICD-10-CM | POA: Insufficient documentation

## 2021-01-24 DIAGNOSIS — I509 Heart failure, unspecified: Secondary | ICD-10-CM | POA: Insufficient documentation

## 2021-01-24 LAB — CBC WITH DIFFERENTIAL/PLATELET
Abs Immature Granulocytes: 0.09 10*3/uL — ABNORMAL HIGH (ref 0.00–0.07)
Basophils Absolute: 0 10*3/uL (ref 0.0–0.1)
Basophils Relative: 0 %
Eosinophils Absolute: 0 10*3/uL (ref 0.0–0.5)
Eosinophils Relative: 0 %
HCT: 32.2 % — ABNORMAL LOW (ref 36.0–46.0)
Hemoglobin: 10.3 g/dL — ABNORMAL LOW (ref 12.0–15.0)
Immature Granulocytes: 3 %
Lymphocytes Relative: 34 %
Lymphs Abs: 1.2 10*3/uL (ref 0.7–4.0)
MCH: 29.9 pg (ref 26.0–34.0)
MCHC: 32 g/dL (ref 30.0–36.0)
MCV: 93.6 fL (ref 80.0–100.0)
Monocytes Absolute: 0.3 10*3/uL (ref 0.1–1.0)
Monocytes Relative: 8 %
Neutro Abs: 2 10*3/uL (ref 1.7–7.7)
Neutrophils Relative %: 55 %
Platelets: UNDETERMINED 10*3/uL (ref 150–400)
RBC: 3.44 MIL/uL — ABNORMAL LOW (ref 3.87–5.11)
RDW: 17 % — ABNORMAL HIGH (ref 11.5–15.5)
WBC: 3.7 10*3/uL — ABNORMAL LOW (ref 4.0–10.5)
nRBC: 1.4 % — ABNORMAL HIGH (ref 0.0–0.2)

## 2021-01-24 LAB — COMPREHENSIVE METABOLIC PANEL
ALT: 22 U/L (ref 0–44)
AST: 22 U/L (ref 15–41)
Albumin: 4.1 g/dL (ref 3.5–5.0)
Alkaline Phosphatase: 71 U/L (ref 38–126)
Anion gap: 10 (ref 5–15)
BUN: 22 mg/dL (ref 8–23)
CO2: 20 mmol/L — ABNORMAL LOW (ref 22–32)
Calcium: 9.5 mg/dL (ref 8.9–10.3)
Chloride: 109 mmol/L (ref 98–111)
Creatinine, Ser: 0.81 mg/dL (ref 0.44–1.00)
GFR, Estimated: >60 mL/min (ref >60)
Glucose, Bld: 115 mg/dL — ABNORMAL HIGH (ref 70–99)
Potassium: 3.8 mmol/L (ref 3.5–5.1)
Sodium: 139 mmol/L (ref 135–145)
Total Bilirubin: 0.7 mg/dL (ref 0.3–1.2)
Total Protein: 7.5 g/dL (ref 6.5–8.1)

## 2021-01-24 MED ORDER — OXYCODONE-ACETAMINOPHEN 5-325 MG PO TABS
1.0000 | ORAL_TABLET | Freq: Once | ORAL | Status: AC
  Administered 2021-01-24: 1 via ORAL
  Filled 2021-01-24: qty 1

## 2021-01-24 MED ORDER — OXYCODONE-ACETAMINOPHEN 5-325 MG PO TABS: 1.0000 | ORAL_TABLET | Freq: Four times a day (QID) | ORAL | 0 refills | Status: DC | PRN

## 2021-01-24 NOTE — Progress Notes (Signed)
LLE venous duplex has been completed.  Preliminary results given to Domenic Moras, Ramer and [id number].  Results can be found under chart review under CV PROC. 01/24/2021 12:28 PM Virgil Slinger RVT, RDMS

## 2021-01-24 NOTE — ED Triage Notes (Signed)
Pt reports with left leg pain and left knee swelling. Pt states that she was dx with gout a few days ago and given medications but it is not working. Pt reports having a pain in the back of her knee and left thigh.

## 2021-01-24 NOTE — Discharge Instructions (Addendum)
You have been evaluated for your left knee and left foot pain.  This is likely due to gout.  Fortunately no evidence of blood clot in your left leg.  Continue taking prednisone as previously prescribed.  You may also take Percocet as needed for pain but please be aware that it can cause drowsiness so therefore avoid driving or operating heavy machinery while on the medication.  Follow-up with your doctor for further care.  Keep your leg elevated while at rest.

## 2021-01-24 NOTE — ED Provider Notes (Signed)
[facility] DEPT Provider Note   CSN: [id number] Arrival date & time: 01/24/21  0602     History Chief Complaint  Patient presents with   Joint Swelling   Leg Pain    Karen Waller is a 65 y.o. female.  The history is provided by the patient and medical records. No language interpreter was used.  Leg Pain  65 year old female recently diagnosed with COVID presenting complaining of left leg pain.  Patient report 4 days ago she developed pain to her left knee and her left great toe.  She was seen at an was diagnosed with having gout.  She was prescribed prednisone and Tylenol.  She has been taking the medication and although she noticed some improvement of pain to left great toe she is noticing increasing pain to her left knee.  Pain is sharp achy throbbing with associated swelling.  She did reach out to her doctor who recommend patient to come to ER for further assessment.  She denies any specific injury denies chest pain or shortness of breath no fever no hip pain.  Pain is worse with palpation.  No prior history of blood clots.  Past Medical History:  Diagnosis Date   Cardiomyopathy    EF 30-35% diagnosed 02/2008   Central obesity    Gout    left knee   HTN (hypertension)    Pancytopenia ([place])     Patient Active Problem List   Diagnosis Date Noted   Iron deficiency anemia 06/22/2019   Pain in left hip 03/24/2018   Bruising, spontaneous 07/06/2017   Left Achilles tendinitis 11/08/2016   Conductive hearing loss, bilateral 08/08/2016   Elevated lipids 12/16/2012   HTN (hypertension) 01/05/2011   CHF (congestive heart failure) ([place]) 10/30/2010   Edema 10/30/2010   Gouty arthropathy 03/17/2008   OVERWEIGHT 03/17/2008   ESSENTIAL HYPERTENSION, BENIGN 03/17/2008   Non-ischemic cardiomyopathy ([place]) 03/17/2008   PALPITATIONS, OCCASIONAL 03/17/2008    Past Surgical History:  Procedure Laterality Date   KNEE SURGERY     TONSILLECTOMY        OB History   No obstetric history on file.     Family History  Problem Relation Age of Onset   Hypertension Mother    Breast cancer Neg Hx     Social History   Tobacco Use   Smoking status: Former    Types: Cigarettes    Quit date: 02/19/1984    Years since quitting: 36.9   Smokeless tobacco: Never  Vaping Use   Vaping Use: Never used  Substance Use Topics   Alcohol use: Not Currently   Drug use: Never    Home Medications Prior to Admission medications   Medication Sig Start Date End Date Taking? Authorizing Provider  amLODipine (NORVASC) 10 MG tablet TAKE 1 TABLET(10 MG) BY MOUTH DAILY 07/12/20   Josue Hector, MD  B Complex Vitamins (B COMPLEX 100 PO)     [provider]  candesartan (ATACAND) 16 MG tablet TAKE 1 TABLET BY MOUTH DAILY 07/11/20   Josue Hector, MD  carvedilol (COREG) 6.25 MG tablet TAKE 1 TABLET BY MOUTH TWICE DAILY WITH A MEAL 07/12/20   Josue Hector, MD  Cholecalciferol (VITAMIN D3) 2000 units capsule Take 2,000 Units by mouth daily.    [provider]  diclofenac sodium (VOLTAREN) 1 % GEL Apply 4 g topically 4 (four) times daily as needed. 03/24/18   Hilts, Legrand Como, MD  furosemide (LASIX) 20 MG tablet TAKE 1/2  TABLET BY MOUTH DAILY 05/15/20   Josue Hector, MD  hydrALAZINE (APRESOLINE) 25 MG tablet TAKE 1 TABLET BY MOUTH TWICE DAILY 09/05/20   Josue Hector, MD  methocarbamol (ROBAXIN) 500 MG tablet Take 1 tablet (500 mg total) by mouth 2 (two) times daily as needed. 06/21/20   Aundra Dubin, PA-C  methylPREDNISolone (MEDROL DOSEPAK) 4 MG TBPK tablet Take as directed 02/15/20   Aundra Dubin, PA-C  Multiple Vitamin (MULTIVITAMIN) capsule Take 1 capsule by mouth daily.    [provider]  predniSONE (STERAPRED UNI-PAK 21 TAB) 5 MG (21) TBPK tablet Take as directed 06/21/20   Aundra Dubin, PA-C  TIZANIDINE HCL PO Take by mouth. Unknown dosage    [provider]    Allergies    Sulfa antibiotics and  Codeine  Review of Systems   Review of Systems  All other systems reviewed and are negative.  Physical Exam Updated Vital Signs BP 132/74   Pulse (!) 56   Temp 97.6 F (36.4 C) (Oral)   Resp 17   SpO2 100%   Physical Exam Vitals and nursing note reviewed.  Constitutional:      General: She is not in acute distress.    Appearance: She is well-developed.  HENT:     Head: Atraumatic.  Eyes:     Conjunctiva/sclera: Conjunctivae normal.  Cardiovascular:     Rate and Rhythm: Normal rate and regular rhythm.     Pulses: Normal pulses.     Heart sounds: Normal heart sounds.  Pulmonary:     Effort: Pulmonary effort is normal.  Abdominal:     Palpations: Abdomen is soft.  Musculoskeletal:        General: Tenderness (Left lower extremity: Tenderness about the left knee left calf and left foot with associated 1+ edema compared to right lower extremity.  Dorsalis pedis pulse palpable.  Leg compartments soft.) present.     Cervical back: Neck supple.  Skin:    Findings: No rash.  Neurological:     Mental Status: She is alert.  Psychiatric:        Mood and Affect: Mood normal.    ED Results / Procedures / Treatments   Labs (all labs ordered are listed, but only abnormal results are displayed) Labs Reviewed  CBC WITH DIFFERENTIAL/PLATELET - Abnormal; Notable for the following components:      Result Value   WBC 3.7 (*)    RBC 3.44 (*)    Hemoglobin 10.3 (*)    HCT 32.2 (*)    RDW 17.0 (*)    nRBC 1.4 (*)    Abs Immature Granulocytes 0.09 (*)    All other components within normal limits  COMPREHENSIVE METABOLIC PANEL - Abnormal; Notable for the following components:   CO2 20 (*)    Glucose, Bld 115 (*)    All other components within normal limits    EKG None  Radiology DG Knee Complete 4 Views Left  Result Date: 01/24/2021 CLINICAL DATA:  65 year old female with history of knee swelling. EXAM: LEFT KNEE - COMPLETE 4+ VIEW COMPARISON:  06/17/2019. FINDINGS: Four  views of the left knee demonstrate no acute displaced fracture, subluxation or dislocation. There is joint space narrowing, subchondral sclerosis, subchondral cyst formation and osteophyte formation in a tricompartmental distribution, most severe in the patellofemoral compartment, indicative of osteoarthritis. Chondrocalcinosis is noted in the knee joint. Numerous vascular calcifications are identified. IMPRESSION: 1. No acute radiographic abnormality of the left knee. 2. Tricompartmental osteoarthritis,  most severe in the patellofemoral compartment. 3. Chondrocalcinosis. Electronically Signed   By: Vinnie Langton M.D.   On: 01/24/2021 07:12    Procedures Procedures   Medications Ordered in ED Medications  oxyCODONE-acetaminophen (PERCOCET/ROXICET) 5-325 MG per tablet 1 tablet (1 tablet Oral Given 01/24/21 0730)    ED Course  I have reviewed the triage vital signs and the nursing notes.  Pertinent labs & imaging results that were available during my care of the patient were reviewed by me and considered in my medical decision making (see chart for details).    MDM Rules/Calculators/A&P                           BP 132/74   Pulse (!) 56   Temp 97.6 F (36.4 C) (Oral)   Resp 17   SpO2 100%   Final Clinical Impression(s) / ED Diagnoses Final diagnoses:  Acute gout of left knee, unspecified cause    Rx / DC Orders ED Discharge Orders          Ordered    oxyCODONE-acetaminophen (PERCOCET) 5-325 MG tablet  Every 6 hours PRN        01/24/21 1025           7:32 AM Patient here with atraumatic left lower extremity pain and swelling.  Was prescribed prednisone for suspected gout but because of increasing left knee pain patient presenting here for further assessment.  She recently had COVID last month.  Plan to obtain venous Doppler ultrasound to rule out DVT involving the left lower extremity.  Pain medication given  10:13 AM Labs are reassuring, x-ray of the left knee  demonstrate tricompartmental osteoarthritis, most severe to the patellofemoral compartment.  Venous Doppler ultrasound study obtained without any evidence of DVT.  Suspect symptoms still could be related to gouty flare.  She is currently on steroid however she may benefit from a short course of opiate pain medication for better management of her condition as she has been taking over-the-counter medication without relief.  I have considered NSAIDs however due to significant history of hypertension and anemia, I felt opiate will be a better option.   Domenic Moras, PA-C 01/24/21 1027    Fredia Sorrow, MD 01/25/21 1123

## 2021-02-06 ENCOUNTER — Other Ambulatory Visit: Payer: Self-pay

## 2021-02-06 ENCOUNTER — Ambulatory Visit (INDEPENDENT_AMBULATORY_CARE_PROVIDER_SITE_OTHER): Payer: 59 | Admitting: Orthopaedic Surgery

## 2021-02-06 DIAGNOSIS — M25562 Pain in left knee: Secondary | ICD-10-CM | POA: Diagnosis not present

## 2021-02-06 MED ORDER — BUPIVACAINE HCL 0.5 % IJ SOLN
2.0000 mL | INTRAMUSCULAR | Status: AC | PRN
  Administered 2021-02-06: 12:00:00 2 mL via INTRA_ARTICULAR

## 2021-02-06 MED ORDER — LIDOCAINE HCL 1 % IJ SOLN
2.0000 mL | INTRAMUSCULAR | Status: AC | PRN
  Administered 2021-02-06: 12:00:00 2 mL

## 2021-02-06 MED ORDER — METHYLPREDNISOLONE ACETATE 40 MG/ML IJ SUSP
40.0000 mg | INTRAMUSCULAR | Status: AC | PRN
  Administered 2021-02-06: 12:00:00 40 mg via INTRA_ARTICULAR

## 2021-02-06 NOTE — Progress Notes (Signed)
Office Visit Note   Patient: Karen Waller           Date of Birth: 26-Feb-1955           MRN: [id number] Visit Date: 02/06/2021              Requested by: Carol Ada, MD [place],  [place] [id number] PCP: Carol Ada, MD   Assessment & Plan: Visit Diagnoses:  1. Acute pain of left knee     Plan: Impression is pseudogout flareup of left knee.  X-rays from the ER shows chondrocalcinosis.  Cortisone injection performed today.  Patient tolerated well.  Follow-up as needed.  Follow-Up Instructions: No follow-ups on file.   Orders:  No orders of the defined types were placed in this encounter.  No orders of the defined types were placed in this encounter.     Procedures: Large Joint Inj: L knee on 02/06/2021 11:55 AM Details: 22 G needle Medications: 2 mL bupivacaine 0.5 %; 2 mL lidocaine 1 %; 40 mg methylPREDNISolone acetate 40 MG/ML Outcome: tolerated well, no immediate complications Patient was prepped and draped in the usual sterile fashion.      Clinical Data: No additional findings.   Subjective: Chief Complaint  Patient presents with   Left Knee - Pain    Karen Waller comes in today for evaluation of acute left knee pain for about 2 weeks.  Denies any injuries.  She went to the ER had ultrasound which was negative for DVT.  She has constant pain.  She has had previous bouts of gout.  Currently taking Tylenol arthritis extra strength.  Has difficulty flexing her knee all the way.   Review of Systems  Constitutional: Negative.   HENT: Negative.    Eyes: Negative.   Respiratory: Negative.    Cardiovascular: Negative.   Endocrine: Negative.   Musculoskeletal: Negative.   Neurological: Negative.   Hematological: Negative.   Psychiatric/Behavioral: Negative.    All other systems reviewed and are negative.   Objective: Vital Signs: There were no vitals taken for this visit.  Physical Exam Vitals and nursing note reviewed.   Constitutional:      Appearance: She is well-developed.  Pulmonary:     Effort: Pulmonary effort is normal.  Skin:    General: Skin is warm.     Capillary Refill: Capillary refill takes less than 2 seconds.  Neurological:     Mental Status: She is alert and oriented to person, place, and time.  Psychiatric:        Behavior: Behavior normal.        Thought Content: Thought content normal.        Judgment: Judgment normal.    Ortho Exam  Left knee shows trace effusion.  Pain with flexion past 90 degrees.  Close and cruciates are stable.  No joint line tenderness.  Specialty Comments:  No specialty comments available.  Imaging: No results found.   PMFS History: Patient Active Problem List   Diagnosis Date Noted   Iron deficiency anemia 06/22/2019   Pain in left hip 03/24/2018   Bruising, spontaneous 07/06/2017   Left Achilles tendinitis 11/08/2016   Conductive hearing loss, bilateral 08/08/2016   Elevated lipids 12/16/2012   HTN (hypertension) 01/05/2011   CHF (congestive heart failure) ([place]) 10/30/2010   Edema 10/30/2010   Gouty arthropathy 03/17/2008   OVERWEIGHT 03/17/2008   ESSENTIAL HYPERTENSION, BENIGN 03/17/2008   Non-ischemic cardiomyopathy ([place]) 03/17/2008   PALPITATIONS, OCCASIONAL 03/17/2008  Past Medical History:  Diagnosis Date   Cardiomyopathy    EF 30-35% diagnosed 02/2008   Central obesity    Gout    left knee   HTN (hypertension)    Pancytopenia ([facility])     Family History  Problem Relation Age of Onset   Hypertension Mother    Breast cancer Neg Hx     Past Surgical History:  Procedure Laterality Date   KNEE SURGERY     TONSILLECTOMY     Social History   Occupational History   Occupation: [employer]  Tobacco Use   Smoking status: Former    Types: Cigarettes    Quit date: 02/19/1984    Years since quitting: 36.9   Smokeless tobacco: Never  Vaping Use   Vaping Use: Never used  Substance and Sexual Activity   Alcohol use: Not  Currently   Drug use: Never   Sexual activity: Not on file

## 2021-02-07 ENCOUNTER — Telehealth: Payer: Self-pay | Admitting: Orthopaedic Surgery

## 2021-02-07 NOTE — Telephone Encounter (Signed)
Please advise work status. Thanks.  Forms have been received from [employer].

## 2021-02-07 NOTE — Telephone Encounter (Signed)
Out of work 2 weeks

## 2021-02-07 NOTE — Telephone Encounter (Signed)
Note made.  

## 2021-02-08 ENCOUNTER — Encounter: Payer: Self-pay | Admitting: Hematology

## 2021-02-08 NOTE — Telephone Encounter (Signed)
Noted for [employer] ?

## 2021-02-13 ENCOUNTER — Other Ambulatory Visit: Payer: Self-pay

## 2021-02-13 ENCOUNTER — Ambulatory Visit (HOSPITAL_COMMUNITY): Payer: 59 | Attending: Cardiovascular Disease

## 2021-02-13 DIAGNOSIS — R55 Syncope and collapse: Secondary | ICD-10-CM

## 2021-02-13 LAB — ECHOCARDIOGRAM COMPLETE
Area-P 1/2: 3.31 cm2
Calc EF: 49.9 %
S' Lateral: 3.6 cm
Single Plane A2C EF: 52.1 %
Single Plane A4C EF: 50.4 %

## 2021-02-14 ENCOUNTER — Telehealth: Payer: Self-pay | Admitting: Orthopaedic Surgery

## 2021-02-14 NOTE — Telephone Encounter (Signed)
Pt submitted medical release form and $25.00 check to [employer]. Please call pt to confirm short term disability forms was faxed. Accepted 02/14/21. Pt phone number is [phone]

## 2021-02-20 ENCOUNTER — Ambulatory Visit: Payer: 59 | Admitting: Physical Therapy

## 2021-02-23 NOTE — Telephone Encounter (Signed)
Pt called back to check on this paperwork and would like a CB to confirm it has been filled out and faxed back. Pt states the disability office will close the account if this isn't sent back as quickly as possible.   [phone]

## 2021-03-14 ENCOUNTER — Other Ambulatory Visit: Payer: Self-pay

## 2021-03-14 ENCOUNTER — Inpatient Hospital Stay: Payer: 59

## 2021-03-14 ENCOUNTER — Inpatient Hospital Stay: Payer: 59 | Attending: Hematology | Admitting: Hematology

## 2021-03-14 VITALS — BP 140/73 | HR 90 | Temp 98.2°F | Resp 16 | Ht 66.0 in | Wt 197.3 lb

## 2021-03-14 DIAGNOSIS — D696 Thrombocytopenia, unspecified: Secondary | ICD-10-CM | POA: Diagnosis present

## 2021-03-14 DIAGNOSIS — D469 Myelodysplastic syndrome, unspecified: Secondary | ICD-10-CM | POA: Diagnosis not present

## 2021-03-14 DIAGNOSIS — D46Z Other myelodysplastic syndromes: Secondary | ICD-10-CM | POA: Insufficient documentation

## 2021-03-14 DIAGNOSIS — D61818 Other pancytopenia: Secondary | ICD-10-CM | POA: Diagnosis not present

## 2021-03-20 ENCOUNTER — Encounter: Payer: Self-pay | Admitting: Hematology

## 2021-03-20 NOTE — Progress Notes (Addendum)
HEMATOLOGY/ONCOLOGY CLINIC NOTE  Date of Service: 04/01/2021  Patient Care Team: Carol Ada, MD as PCP - General (Family Medicine) Josue Hector, MD as PCP - Cardiology (Cardiology)  CHIEF COMPLAINTS/PURPOSE OF CONSULTATION:  Follow-up for continued evaluation and management of MDS  HISTORY OF PRESENTING ILLNESS:   Karen Waller is a wonderful 66 y.o. female who has been previously followed by my colleague Dr. Grace Isaac for evaluation and management of Spontaneous bruising. The pt reports that she is doing well overall.   The pt reports that she has continued to have spontaneous bruising, without raised lumps. She first noticed this in the last two years and takes Meloxicam and Amlodipine regularly, and aspirin very rarely. She also notes that she does not always bruise in response to bumping into objects.   She denies any GI bleeding, large muscle hematoma, bleeding into her joints. She notes that she did not have heavy periods in the past. She has had knee surgery and a tonsillectomy without bleeding complications.   Most recent lab results (06/17/17) of CBC w/diff and CMP is as follows: all values are WNL except for RDW at 17.0, PLT at 118k.  On review of systems, pt reports bruising easily, and denies fevers, chills, night sweats, abdominal pains, leg swelling, and any other symptoms.   On PMHx the pt reports tonsillectomy and knee surgery. On Family Hx the pt denies blood disorders, clotting disorders.  Interval History:   Karen Waller is here for continued evaluation and management of her easy bruisability and thrombocytopenia evolving slowly from at least 2019. 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  No increase in blasts cytogenetics with deletion 20 q. NGS with only CBL mutation.  Patient developed some progressive cytopenias and was referred to Dr. Junius Finner and is being  evaluated with a repeat bone marrow biopsy to evaluate for any new changes over the last couple of years.  Patient had delay in her clinic follow-up with [place] after her last clinic visit in July last year.  She notes that she had COVID infection in November 2022 while she was on a cruise ship and had passed out.  Thought to be heatstroke.  Subsequently tested positive for COVID and took Paxlovid for 3 days but stopped due to gout issues.  Labs done 03/12/2021 reviewed showed hemoglobin of 10.8, WBC count of 3.3k with ANC 900 and platelets of 92k CMP stable except potassium of 3.3  Patient is scheduled for bone marrow biopsy on 03/22/2021 [facility]. MEDICAL HISTORY:  Past Medical History:  Diagnosis Date   Cardiomyopathy    EF 30-35% diagnosed 02/2008   Central obesity    Gout    left knee   HTN (hypertension)    Pancytopenia ([place])     SURGICAL HISTORY: Past Surgical History:  Procedure Laterality Date   KNEE SURGERY     TONSILLECTOMY      SOCIAL HISTORY: Social History   Socioeconomic History   Marital status: Married    Spouse name: Not on file   Number of children: 2   Years of education: Not on file   Highest education level: Not on file  Occupational History   Occupation: [employer]  Tobacco Use   Smoking status: Former    Types: Cigarettes    Quit date: 02/19/1984    Years since quitting: 37.1   Smokeless tobacco: Never  Vaping Use  Vaping Use: Never used  Substance and Sexual Activity   Alcohol use: Not Currently   Drug use: Never   Sexual activity: Not on file  Other Topics Concern   Not on file  Social History Narrative   Not on file   Social Determinants of Health   Financial Resource Strain: Not on file  Food Insecurity: Not on file  Transportation Needs: Not on file  Physical Activity: Not on file  Stress: Not on file  Social Connections: Not on file  Intimate Partner Violence: Not on file    FAMILY HISTORY: Family History  Problem  Relation Age of Onset   Hypertension Mother    Breast cancer Neg Hx     ALLERGIES:  is allergic to sulfa antibiotics and codeine.  MEDICATIONS:  Current Outpatient Medications  Medication Sig Dispense Refill   amLODipine (NORVASC) 10 MG tablet TAKE 1 TABLET(10 MG) BY MOUTH DAILY 90 tablet 3   B Complex Vitamins (B COMPLEX 100 PO)      candesartan (ATACAND) 16 MG tablet TAKE 1 TABLET BY MOUTH DAILY 90 tablet 2   carvedilol (COREG) 6.25 MG tablet TAKE 1 TABLET BY MOUTH TWICE DAILY WITH A MEAL 180 tablet 3   Cholecalciferol (VITAMIN D3) 2000 units capsule Take 2,000 Units by mouth daily.     diclofenac sodium (VOLTAREN) 1 % GEL Apply 4 g topically 4 (four) times daily as needed. 500 g 6   furosemide (LASIX) 20 MG tablet TAKE 1/2 TABLET BY MOUTH DAILY 45 tablet 3   hydrALAZINE (APRESOLINE) 25 MG tablet TAKE 1 TABLET BY MOUTH TWICE DAILY 180 tablet 3   methocarbamol (ROBAXIN) 500 MG tablet Take 1 tablet (500 mg total) by mouth 2 (two) times daily as needed. 20 tablet 0   methylPREDNISolone (MEDROL DOSEPAK) 4 MG TBPK tablet Take as directed 21 tablet 0   Multiple Vitamin (MULTIVITAMIN) capsule Take 1 capsule by mouth daily.     oxyCODONE-acetaminophen (PERCOCET) 5-325 MG tablet Take 1 tablet by mouth every 6 (six) hours as needed for moderate pain or severe pain. 10 tablet 0   predniSONE (STERAPRED UNI-PAK 21 TAB) 10 MG (21) TBPK tablet Take as directed 21 tablet 0   TIZANIDINE HCL PO Take by mouth. Unknown dosage     traMADol (ULTRAM) 50 MG tablet Take 1-2 tablets (50-100 mg total) by mouth daily as needed. 20 tablet 0   No current facility-administered medications for this visit.    REVIEW OF SYSTEMS:   .10 Point review of Systems was done is negative except as noted above.  PHYSICAL EXAMINATION:  Vitals:   03/14/21 1500  BP: 140/73  Pulse: 90  Resp: 16  Temp: 98.2 F (36.8 C)  SpO2: 98%    Filed Weights   03/14/21 1500  Weight: 197 lb 4.8 oz (89.5 kg)    .Body mass index  is 31.85 kg/m.   NAD GENERAL:alert, in no acute distress and comfortable SKIN: no acute rashes, no significant lesions EYES: conjunctiva are pink and non-injected, sclera anicteric OROPHARYNX: MMM, no exudates, no oropharyngeal erythema or ulceration NECK: supple, no JVD LYMPH:  no palpable lymphadenopathy in the cervical, axillary or inguinal regions LUNGS: clear to auscultation b/l with normal respiratory effort HEART: regular rate & rhythm ABDOMEN:  normoactive bowel sounds , non tender, not distended. Extremity: no pedal edema PSYCH: alert & oriented x 3 with fluent speech NEURO: no focal motor/sensory deficits    LABORATORY DATA:  I have reviewed the data as listed  .  important suggestion  View Detailed Reports    important suggestion  View Condensed Results Report    Contains abnormal data CBC and Differential Order: [id number]  suggestion  Information displayed in this report may not trend or trigger automated decision support.    Ref Range & Units 2 wk ago  WBC 4.4 - 11.0 x 10*3/uL 3.3 Low    RBC 4.10 - 5.10 x 10*6/uL 3.54 Low    Hemoglobin 12.3 - 15.3 G/DL 10.8 Low    Hematocrit 35.9 - 44.6 % 33.0 Low    MCV 80.0 - 96.0 FL 93.0   MCH 27.5 - 33.2 PG 30.4   MCHC 33.0 - 37.0 G/DL 32.7 Low    RDW 12.3 - 17.0 % 19.8 High    Platelets 150 - 450 X 10*3/uL 92 Low    MPV 6.8 - 10.2 FL 10.6 High    Neutrophil % % 28   Lymphocyte % % 59   Monocyte % % 10   Eosinophil % % 3   Basophil % % 0   Seg Absolute 1.8 - 7.8 x 10*3/uL 0.9 Low    Lymphocyte Absolute 1.0 - 4.8 x 10*3/uL 2.0   Monocyte Absolute 0.0 - 0.8 x 10*3/uL 0.3   Eosinophil Absolute 0.0 - 0.5 x 10*3/uL 0.1   Basophil Absolute 0.0 - 0.2 x 10*3/uL 0.0   NRBC Manual <=0 / 100 WBC 1 High    Ovalocytes  1+   Polychromasia  1+   Schistocytes  1+   Tear Drop  1+   Platelet Morphology  1   Comment: Platelet Morphology Appear Normal  WBC MORPHOLOGY  1   Comment: Manual Diff Performed  Specimen Collected:  03/12/21 10:07 Last Resulted: 03/12/21 11:02  Received From: [facility]  Result Received: 03/13/21 14:07   View Encounter     Received Information  Contains abnormal data Comprehensive Metabolic Panel Order: [id number]  suggestion  Information displayed in this report may not trend or trigger automated decision support.    Ref Range & Units 2 wk ago  Sodium 135 - 146 MMOL/L 140   Potassium 3.5 - 5.3 MMOL/L 3.4 Low    Comment: NO VISIBLE HEMOLYSIS  Chloride 98 - 110 MMOL/L 106   CO2 23 - 30 MMOL/L 26   BUN 8 - 24 MG/DL 19   Glucose 70 - 99 MG/DL 133 High    Comment: Patients taking eltrombopag at doses >/= 100 mg daily may show falsely elevated values of 10% or greater.  Creatinine 0.50 - 1.50 MG/DL 0.93   Calcium 8.5 - 10.5 MG/DL 9.2   Total Protein 6.0 - 8.3 G/DL 6.7   Comment: Patients taking eltrombopag at doses >/= 100 mg daily may show falsely elevated values of 10% or greater.  Albumin  3.5 - 5.0 G/DL 3.8   Total Bilirubin 0.1 - 1.2 MG/DL 0.6   Comment: Patients taking eltrombopag at doses >/= 100 mg daily may show falsely elevated values of 10% or greater.  Alkaline Phosphatase 34 - 104 IU/L or U/L 69   AST (SGOT) 5 - 40 IU/L or U/L 10   ALT (SGPT) 5 - 50 IU/L or U/L 14   Anion Gap 4 - 14 MMOL/L 8   Est. GFR >=60 ML/MIN/1.73 M*2 69   Comment: GFR estimated by CKD-EPI equations(NKF 2021).   "Recommend confirmation of Cr-based eGFR by using Cys-based eGFR and other filtration markers (if applicable) in complex cases and clinical decision-making, as needed."  Specimen Collected: 03/12/21 10:07 Last Resulted: 03/12/21 10:35  Received From: [facility]  Result Received: 03/13/21 14:07   View Encounter     Received Information Magnesium Order: [id number]  suggestion  Information displayed in this report may not trend or trigger automated decision support.    Ref Range & Units 2 wk ago  Magnesium 1.8 - 2.4 MG/DL 1.9   Comment:  Patients taking eltrombopag at doses >/= 100 mg daily may show falsely elevated values of 10% or greater.  Specimen Collected: 03/12/21 10:07 Last Resulted: 03/12/21 10:35  Received From: [facility]  Result Received: 03/13/21 14:07   View Encounter     Received Information CBC Latest Ref Rng & Units 01/24/2021 09/11/2020 09/11/2020  WBC 4.0 - 10.5 K/uL 3.7(L) 2.6(L) 2.5(L)  Hemoglobin 12.0 - 15.0 g/dL 10.3(L) 9.9(L) 9.8(L)  Hematocrit 36.0 - 46.0 % 32.2(L) 30.9(L) 30.8(L)  Platelets 150 - 400 K/uL PLATELET CLUMPS NOTED ON SMEAR, UNABLE TO ESTIMATE 80(L) PLATELET CLUMPS NOTED ON SMEAR, UNABLE TO ESTIMATE    . CMP Latest Ref Rng & Units 01/24/2021 09/11/2020 03/13/2020  Glucose 70 - 99 mg/dL 115(H) 180(H) 164(H)  BUN 8 - 23 mg/dL 22 25(H) 17  Creatinine 0.44 - 1.00 mg/dL 0.81 1.17(H) 1.11(H)  Sodium 135 - 145 mmol/L 139 140 141  Potassium 3.5 - 5.1 mmol/L 3.8 3.9 3.8  Chloride 98 - 111 mmol/L 109 107 109  CO2 22 - 32 mmol/L 20(L) 23 24  Calcium 8.9 - 10.3 mg/dL 9.5 9.3 9.0  Total Protein 6.5 - 8.1 g/dL 7.5 7.3 7.1  Total Bilirubin 0.3 - 1.2 mg/dL 0.7 0.5 0.6  Alkaline Phos 38 - 126 U/L 71 74 75  AST 15 - 41 U/L 22 13(L) 15  ALT 0 - 44 U/L 22 14 13    Component     Latest Ref Rng & Units 06/17/2017  Coagulation Factor VIII     57 - 163 % 128  Ristocetin Co-factor, Plasma     50 - 200 % 117  Von Willebrand Antigen, Plasma     50 - 200 % 126  Prothrombin Time     11.4 - 15.2 seconds 12.9  INR      0.98  PFA Interpretation        Collagen / Epinephrine     0 - 193 seconds 192  Fibrinogen     210 - 475 mg/dL 291  APTT     24 - 36 seconds 31  LDH     125 - 245 U/L 230    11/26/2018 BM Flow Pathology Report    11/26/2018 BM Bx Report    Surgical Pathology  CASE: [id number]  PATIENT: Karen Waller  Bone Marrow Report    Clinical History: pancytopenia, paraproteinemia, right iliac bone      DIAGNOSIS:   BONE MARROW, ASPIRATE, CLOT, CORE:   -Hypercellular bone marrow for age with trilineage hematopoiesis and  fibrosis.  See comment   PERIPHERAL BLOOD:  -Pancytopenia   COMMENT:   The aspirate, touch imprints, and clot sections are suboptimal which  hinders cytomorphologic evaluation.  The core biopsy is optimal and  shows a hypercellular bone marrow (90%) with a mixture of myeloid cell  lines.  This is associated with reticulin fibrosis.  A significant  CD34-positive blast population is not seen.  The myeloid changes are not  considered specific, and hence, it is unclear whether the findings  represent a primary myeloid neoplasm or represent  secondary changes.  Correlation with cytogenetic studies is recommended.  There is no  evidence of lymphoproliferative process or diagnostic plasma cell  neoplasm.    11/26/2018 BM Cytogenetics:    RADIOGRAPHIC STUDIES: I have personally reviewed the radiological images as listed and agreed with the findings in the report. XR KNEE 3 VIEW LEFT  Result Date: 03/29/2021 Moderate degenerative changes primarily to the medial and patellofemoral compartments with evidence of medial and lateral compartment chondrocalcinosis.  She also has osteophyte formation of the superior aspect of the patella.  No evidence of patella baja or alta.    ASSESSMENT & PLAN:   66 y.o. female with  1. Minor Spontaneous?/undetected trauma causing bruising No ecchymosis with bruising  Clotting parameters normal from 06/17/17 with normal PT, aPTT and VWD panel and platelet function testing 01/08/18 PT and aPTT, fibrinogen WNL  2. Thrombocytopenia Pt's PLT in 10/19/14 were normal at 215k when she was having abnormal bruising Now with mild thrombocytopenia PLT t92k on 03/12/21  11/16/2018 US Abdomen ([id number]) revealed "Cholelithiasis. No evidence for acute abnormality." 11/26/2018 BM Bx ([id number]) revealed "-Hypercellular bone marrow for age with trilineage hematopoiesis and fibrosis.  Pancytopenia" 11/26/2018 BM Flow Pathology Report [id number]) revealed  "-No significant blastic population identified. 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 MDS/MPN with fibrosis -- causing Pancytopenia --   Oct 2020 BM Bx - hypercellular bone marrow with some reticulin fibrosis. 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 NGS molecular testing showed CBL mutation   PLAN: -Labs done 03/12/2021 reviewed showed hemoglobin of 10.8, WBC count of 3.3k with ANC 900 and platelets of 92k CMP stable except potassium of 3.3 -Patient does follow with Dr. Dante Gang at [facility] and is scheduled for repeat bone marrow biopsy on 03/22/2021.  This might shed more light on her definitive diagnosis with potential progressive changes in her bone marrow over the last 2 to 3 years. -We will continue supportive treatments at this time. -Further treatment considerations based on repeat bone marrow biopsy results. -Continue Vitamin B-complex daily. -Appreciate input from Dr. Terrace Arabia -Patient had COVID infection on a cruise in November 2022 but notes no other issues with recurrent infections. -Still some mild easy bruisability but no significant GI CNS or other bleeding manifestations. -Mild fatigue but this has not changed significantly. -No indication for transfusion support at this time. -  FOLLOW UP: Monthly labs x 3 starting in 1 month RTC with Dr Irene Limbo with labs in 3 months  All of the patient's questions were answered with apparent satisfaction. The patient knows to call the clinic with any problems, questions or concerns.    Sullivan Lone MD MS AAHIVMS [facility] Hematology/Oncology Physician [facility]       .Marland Kitchen

## 2021-03-22 ENCOUNTER — Telehealth: Payer: Self-pay | Admitting: Hematology

## 2021-03-22 NOTE — Telephone Encounter (Signed)
Scheduled per 01/25 los, patient has been called and notified of upcoming appointments. °

## 2021-03-23 ENCOUNTER — Telehealth: Payer: Self-pay | Admitting: Orthopaedic Surgery

## 2021-03-23 MED ORDER — TRAMADOL HCL 50 MG PO TABS: 50.0000 mg | ORAL_TABLET | Freq: Every day | ORAL | 0 refills | Status: DC | PRN

## 2021-03-23 NOTE — Telephone Encounter (Signed)
Patient called asked if she can get something called into her pharmacy for the pain in her left knee. Patient uses [facility] on [address]. The number to contact patient is [phone]

## 2021-03-23 NOTE — Telephone Encounter (Signed)
done

## 2021-03-28 ENCOUNTER — Encounter: Payer: Self-pay | Admitting: Physician Assistant

## 2021-03-28 ENCOUNTER — Other Ambulatory Visit: Payer: Self-pay

## 2021-03-28 ENCOUNTER — Ambulatory Visit (INDEPENDENT_AMBULATORY_CARE_PROVIDER_SITE_OTHER): Payer: 59

## 2021-03-28 ENCOUNTER — Ambulatory Visit (INDEPENDENT_AMBULATORY_CARE_PROVIDER_SITE_OTHER): Payer: 59 | Admitting: Orthopaedic Surgery

## 2021-03-28 DIAGNOSIS — M25562 Pain in left knee: Secondary | ICD-10-CM | POA: Diagnosis not present

## 2021-03-28 DIAGNOSIS — G8929 Other chronic pain: Secondary | ICD-10-CM

## 2021-03-28 MED ORDER — METHOCARBAMOL 500 MG PO TABS: 500.0000 mg | ORAL_TABLET | Freq: Two times a day (BID) | ORAL | 0 refills | Status: DC | PRN

## 2021-03-28 MED ORDER — PREDNISONE 10 MG (21) PO TBPK: ORAL_TABLET | ORAL | 0 refills | Status: DC

## 2021-03-28 NOTE — Progress Notes (Signed)
Office Visit Note   Patient: Karen Waller           Date of Birth: December 18, 1955           MRN: [id number] Visit Date: 03/28/2021              Requested by: Carol Ada, MD [address],  [place] [address] PCP: Carol Ada, MD   Assessment & Plan: Visit Diagnoses:  1. Chronic pain of left knee     Plan: Impression is chronic left knee pain with underlying osteoarthritis and chondrocalcinosis.  It is hard to tell exactly where the patient's symptoms are coming from, but I am not concerned for infection.  I would like to try and settle things down with a steroid taper and muscle relaxer.  She will follow-up with [place] next week for repeat evaluation if her symptoms have not improved.  She will call with any concerns or questions in the meantime.  Follow-Up Instructions: Return in about 1 week (around 04/04/2021).   Orders:  Orders Placed This Encounter  Procedures   XR KNEE 3 VIEW LEFT   No orders of the defined types were placed in this encounter.     Procedures: No procedures performed   Clinical Data: No additional findings.   Subjective: Chief Complaint  Patient presents with   Left Knee - Pain    HPI patient is a pleasant 66 year old female with a history of underlying osteoarthritis and chondrocalcinosis who comes in today with recurrent left knee pain.  She was seen in our office back in December where a left knee cortisone injection was performed which helped quite a bit.  She notes that her pain returned approximately a week ago around the same time as undergoing a bone marrow biopsy.  She denies any actual injury.  The pain she has having is to the superior aspect of the patella as well as the back of the thigh.  Pain is worse when trying to fully extend her knee as well as when she is sitting on the left thigh.  She has been taking medicine for pain which has not provided adequate relief.  She denies any fevers or chills.  She has not  recently been on fluoroquinolone antibiotics.  Review of Systems as detailed in HPI.  All others reviewed and are negative.   Objective: Vital Signs: There were no vitals taken for this visit.  Physical Exam well-developed well-nourished female no acute distress.  Alert and oriented x3.  Ortho Exam left knee exam shows a small effusion.  Range of motion 30- 100 degrees.  I can get her to full extension without pain.  No joint line tenderness.  No warmth or skin changes.  She does have exquisite tenderness and slight swelling to the superior aspect of the patella.  She is unable to straight leg raise.  She is neurovascularly intact distally.  Specialty Comments:  No specialty comments available.  Imaging: XR KNEE 3 VIEW LEFT  Result Date: 03/28/2021 Moderate degenerative changes primarily to the medial and patellofemoral compartments with evidence of medial and lateral compartment chondrocalcinosis.  She also has osteophyte formation of the superior aspect of the patella.  No evidence of patella baja or alta.    PMFS History: Patient Active Problem List   Diagnosis Date Noted   Iron deficiency anemia 06/22/2019   Pain in left hip 03/24/2018   Bruising, spontaneous 07/06/2017   Left Achilles tendinitis 11/08/2016   Conductive hearing  loss, bilateral 08/08/2016   Elevated lipids 12/16/2012   HTN (hypertension) 01/05/2011   CHF (congestive heart failure) ([place]) 10/30/2010   Edema 10/30/2010   Gouty arthropathy 03/17/2008   OVERWEIGHT 03/17/2008   ESSENTIAL HYPERTENSION, BENIGN 03/17/2008   Non-ischemic cardiomyopathy ([place]) 03/17/2008   PALPITATIONS, OCCASIONAL 03/17/2008   Past Medical History:  Diagnosis Date   Cardiomyopathy    EF 30-35% diagnosed 02/2008   Central obesity    Gout    left knee   HTN (hypertension)    Pancytopenia ([facility])     Family History  Problem Relation Age of Onset   Hypertension Mother    Breast cancer Neg Hx     Past Surgical History:  Procedure  Laterality Date   KNEE SURGERY     TONSILLECTOMY     Social History   Occupational History   Occupation: [employer]  Tobacco Use   Smoking status: Former    Types: Cigarettes    Quit date: 02/19/1984    Years since quitting: 37.1   Smokeless tobacco: Never  Vaping Use   Vaping Use: Never used  Substance and Sexual Activity   Alcohol use: Not Currently   Drug use: Never   Sexual activity: Not on file

## 2021-04-06 ENCOUNTER — Other Ambulatory Visit: Payer: Self-pay | Admitting: Cardiovascular Disease

## 2021-04-06 ENCOUNTER — Encounter: Payer: Self-pay | Admitting: Orthopaedic Surgery

## 2021-04-06 ENCOUNTER — Other Ambulatory Visit: Payer: Self-pay

## 2021-04-06 ENCOUNTER — Ambulatory Visit (INDEPENDENT_AMBULATORY_CARE_PROVIDER_SITE_OTHER): Payer: 59 | Admitting: Orthopaedic Surgery

## 2021-04-06 DIAGNOSIS — M25562 Pain in left knee: Secondary | ICD-10-CM | POA: Diagnosis not present

## 2021-04-06 DIAGNOSIS — G8929 Other chronic pain: Secondary | ICD-10-CM | POA: Diagnosis not present

## 2021-04-06 NOTE — Progress Notes (Signed)
° °  Office Visit Note   Patient: Karen Waller           Date of Birth: 10/06/1955           MRN: [id number] Visit Date: 04/06/2021              Requested by: Karen Ada, MD Karen Waller,  [place] [address] PCP: Karen Ada, MD   Assessment & Plan: Visit Diagnoses:  1. Chronic pain of left knee     Plan: Karen Waller returns today for continued left knee pain.  The pain is still localized to the superior aspect of the patella and distal quadriceps.  It is very tender to palpation.  The pain is worse with using steps.  Examination of the left knee shows exquisite tenderness to the distal quadriceps insertion.  I do not appreciate an effusion.  Gentle range of motion is well-tolerated.  She has reproducible pain with resisted knee extension.  Impression is quadriceps tendinopathy.  Her x-rays demonstrate a significant enthesophyte and multiple ossicles within the distal quadriceps tendon.  I recommend relative rest, Voltaren gel, avoidance of steps and inclines.  She will use a knee sleeve during activity.  She cannot take NSAIDs and will continue to take Tylenol and tramadol as needed.  We will see her back as needed.  Follow-Up Instructions: No follow-ups on file.   Orders:  No orders of the defined types were placed in this encounter.  No orders of the defined types were placed in this encounter.     Procedures: No procedures performed   Clinical Data: No additional findings.   Subjective: Chief Complaint  Patient presents with   Left Knee - Pain, Follow-up    HPI  Review of Systems   Objective: Vital Signs: There were no vitals taken for this visit.  Physical Exam  Ortho Exam  Specialty Comments:  No specialty comments available.  Imaging: No results found.   PMFS History: Patient Active Problem List   Diagnosis Date Noted   Iron deficiency anemia 06/22/2019   Pain in left hip 03/24/2018   Bruising, spontaneous 07/06/2017    Left Achilles tendinitis 11/08/2016   Conductive hearing loss, bilateral 08/08/2016   Elevated lipids 12/16/2012   HTN (hypertension) 01/05/2011   CHF (congestive heart failure) ([place]) 10/30/2010   Edema 10/30/2010   Gouty arthropathy 03/17/2008   OVERWEIGHT 03/17/2008   ESSENTIAL HYPERTENSION, BENIGN 03/17/2008   Non-ischemic cardiomyopathy ([place]) 03/17/2008   PALPITATIONS, OCCASIONAL 03/17/2008   Past Medical History:  Diagnosis Date   Cardiomyopathy    EF 30-35% diagnosed 02/2008   Central obesity    Gout    left knee   HTN (hypertension)    Pancytopenia ([place])     Family History  Problem Relation Age of Onset   Hypertension Mother    Breast cancer Neg Hx     Past Surgical History:  Procedure Laterality Date   KNEE SURGERY     TONSILLECTOMY     Social History   Occupational History   Occupation: [employer]  Tobacco Use   Smoking status: Former    Types: Cigarettes    Quit date: 02/19/1984    Years since quitting: 37.1   Smokeless tobacco: Never  Vaping Use   Vaping Use: Never used  Substance and Sexual Activity   Alcohol use: Not Currently   Drug use: Never   Sexual activity: Not on file

## 2021-04-10 ENCOUNTER — Other Ambulatory Visit: Payer: Self-pay

## 2021-04-10 DIAGNOSIS — D469 Myelodysplastic syndrome, unspecified: Secondary | ICD-10-CM

## 2021-04-10 NOTE — Progress Notes (Unsigned)
Cardiology Office Note    Date:  04/10/2021   ID:  Karen, Waller [birth year], MRN [id number]   PCP:  Carol Ada, [place]  Cardiologist:  Jenkins Rouge, MD *** Advanced Practice Provider:  No care team member to display Electrophysiologist:  None   [phone]   No chief complaint on file.   History of Present Illness:  Karen Waller is a 66 y.o. female with history of dilated cardiomyopathy ejection fraction as low as 35% but last echo 06/2018 EF 55 to 60% normal valves, palpitations with PACs and PVCs on monitor, hypertension, hyperlipidemia.  Patient called in 01/18/2021 to report she had an episode of syncope in the setting of COVID and dehydration while on a cruise.  She had not eaten all day and blood sugar was elevated.  She was given IV fluids and Paxlovid.  EKG was done and she was told it was okay. Dr. Johnsie Cancel ordered an echo 02/13/21 which showed low normal EF 50-55% no changes made.    Past Medical History:  Diagnosis Date   Cardiomyopathy    EF 30-35% diagnosed 02/2008   Central obesity    Gout    left knee   HTN (hypertension)    Pancytopenia ([place])     Past Surgical History:  Procedure Laterality Date   KNEE SURGERY     TONSILLECTOMY      Current Medications: No outpatient medications have been marked as taking for the 04/18/21 encounter (Appointment) with Imogene Burn, PA-C.     Allergies:   Sulfa antibiotics and Codeine   Social History   Socioeconomic History   Marital status: Married    Spouse name: Not on file   Number of children: 2   Years of education: Not on file   Highest education level: Not on file  Occupational History   Occupation: [employer]  Tobacco Use   Smoking status: Former    Types: Cigarettes    Quit date: 02/19/1984    Years since quitting: 37.1   Smokeless tobacco: Never  Vaping Use   Vaping Use: Never used  Substance and Sexual Activity   Alcohol use: Not Currently   Drug  use: Never   Sexual activity: Not on file  Other Topics Concern   Not on file  Social History Narrative   Not on file   Social Determinants of Health   Financial Resource Strain: Not on file  Food Insecurity: Not on file  Transportation Needs: Not on file  Physical Activity: Not on file  Stress: Not on file  Social Connections: Not on file     Family History:  The patient's ***family history includes Hypertension in her mother.   ROS:   Please see the history of present illness.    ROS All other systems reviewed and are negative.   PHYSICAL EXAM:   VS:  There were no vitals taken for this visit.  Physical Exam  GEN: Well nourished, well developed, in no acute distress  HEENT: normal  Neck: no JVD, carotid bruits, or masses Cardiac:RRR; no murmurs, rubs, or gallops  Respiratory:  clear to auscultation bilaterally, normal work of breathing GI: soft, nontender, nondistended, + BS Ext: without cyanosis, clubbing, or edema, Good distal pulses bilaterally MS: no deformity or atrophy  Skin: warm and dry, no rash Neuro:  Alert and Oriented x 3, Strength and sensation are intact Psych: euthymic mood, full affect  Wt Readings from Last 3  Encounters:  03/14/21 197 lb 4.8 oz (89.5 kg)  09/11/20 195 lb 12.8 oz (88.8 kg)  07/14/20 190 lb (86.2 kg)      Studies/Labs Reviewed:   EKG:  EKG is*** ordered today.  The ekg ordered today demonstrates ***  Recent Labs: 01/24/2021: ALT 22; BUN 22; Creatinine, Ser 0.81; Hemoglobin 10.3; Platelets PLATELET CLUMPS NOTED ON SMEAR, UNABLE TO ESTIMATE; Potassium 3.8; Sodium 139   Lipid Panel No results found for: CHOL, TRIG, HDL, CHOLHDL, VLDL, LDLCALC, LDLDIRECT  Additional studies/ records that were reviewed today include:  2D echo 02/13/2021  IMPRESSIONS     1. Mild anterior and basal to mid anteroseptal hypokinesis. Left  ventricular ejection fraction, by estimation, is 50 to 55%. The left  ventricle has low normal function. The  left ventricle demonstrates  regional wall motion abnormalities (see scoring  diagram/findings for description). Left ventricular diastolic parameters  are consistent with Grade I diastolic dysfunction (impaired relaxation).  The average left ventricular global longitudinal strain is -18.4 %. The  global longitudinal strain is  normal.   2. Right ventricular systolic function is normal. The right ventricular  size is normal. There is normal pulmonary artery systolic pressure.   3. The mitral valve is normal in structure. Trivial mitral valve  regurgitation. No evidence of mitral stenosis.   4. The aortic valve is tricuspid. Aortic valve regurgitation is not  visualized. No aortic stenosis is present.   5. The inferior vena cava is normal in size with greater than 50%  respiratory variability, suggesting right atrial pressure of 3 mmHg.   Echo 07/16/18 IMPRESSIONS     1. The left ventricle has normal systolic function, with an ejection  fraction of 55-60%. The cavity size was normal. Left ventricular diastolic  Doppler parameters are consistent with pseudonormalization.   2. The right ventricle has normal systolic function. The cavity was  normal. There is no increase in right ventricular wall thickness.   3. No evidence of mitral valve stenosis.   4. No stenosis of the aortic valve.   5. The aortic root and ascending aorta are normal in size and structure.   6. The average left ventricular global longitudinal strain is -16.6 %.   7. The interatrial septum was not assessed.   FINDINGS   Left Ventricle: The left ventricle has normal systolic function, with an  ejection fraction of 55-60%. The cavity size was normal. There is no  increase in left ventricular wall thickness. Left ventricular diastolic  Doppler parameters are consistent with  pseudonormalization. The average left ventricular global longitudinal  strain is -16.6 %.   Right Ventricle: The right ventricle has normal  systolic function. The  cavity was normal. There is no increase in right ventricular wall  thickness.   Left Atrium: Left atrial size was normal in size.   Right Atrium: Right atrial size was normal in size. Right atrial pressure  is estimated at 10 mmHg.   Interatrial Septum: The interatrial septum was not assessed.   Pericardium: There is no evidence of pericardial effusion.   Mitral Valve: The mitral valve is normal in structure. Mitral valve  regurgitation is trivial by color flow Doppler. No evidence of mitral  valve stenosis.   Tricuspid Valve: The tricuspid valve is normal in structure. Tricuspid  valve regurgitation is mild by color flow Doppler.   Aortic Valve: The aortic valve is normal in structure. Aortic valve  regurgitation was not visualized by color flow Doppler. There is No  stenosis  of the aortic valve.   Pulmonic Valve: The pulmonic valve was grossly normal. Pulmonic valve  regurgitation is not visualized by color flow Doppler.   Aorta: The aortic root and ascending aorta are normal in size and  structure.   Risk Assessment/Calculations:   {Does this patient have ATRIAL FIBRILLATION?[phone]}     ASSESSMENT:    1. NICM (nonischemic cardiomyopathy) ([place])   2. Syncope, unspecified syncope type   3. Essential hypertension, benign   4. Palpitations      PLAN:  In order of problems listed above:  Dilated cardiomyopathy ejection fraction normalized on echo 2020 follow-up echo 02/13/2021 LVEF 50 to 55% with mild anterior and basal to mid anterior septal hypokinesis grade 1 DD.  Syncope 01/2021 in the setting of dehydration and COVID while on a cruise treated with IV fluids and Paxlovid.  Follow-up echo stable  Hypertension  Palpitations  Shared Decision Making/Informed Consent   {Are you ordering a CV Procedure (e.g. stress test, cath, DCCV, TEE, etc)?   Press F2        :[id number]}    Medication Adjustments/Labs and Tests Ordered: Current  medicines are reviewed at length with the patient today.  Concerns regarding medicines are outlined above.  Medication changes, Labs and Tests ordered today are listed in the Patient Instructions below. There are no Patient Instructions on file for this visit.   Sumner Boast, PA-C  04/10/2021 2:48 PM    [facility] [address], [place], [place]  [address] Phone: [phone]; Fax: [phone]

## 2021-04-11 ENCOUNTER — Inpatient Hospital Stay: Payer: 59 | Attending: Hematology

## 2021-04-11 ENCOUNTER — Other Ambulatory Visit: Payer: Self-pay

## 2021-04-11 DIAGNOSIS — D469 Myelodysplastic syndrome, unspecified: Secondary | ICD-10-CM | POA: Diagnosis not present

## 2021-04-11 DIAGNOSIS — D696 Thrombocytopenia, unspecified: Secondary | ICD-10-CM | POA: Insufficient documentation

## 2021-04-11 DIAGNOSIS — D61818 Other pancytopenia: Secondary | ICD-10-CM | POA: Insufficient documentation

## 2021-04-11 LAB — CMP (CANCER CENTER ONLY)
ALT: 19 U/L (ref 0–44)
AST: 14 U/L — ABNORMAL LOW (ref 15–41)
Albumin: 4.1 g/dL (ref 3.5–5.0)
Alkaline Phosphatase: 75 U/L (ref 38–126)
Anion gap: 7 (ref 5–15)
BUN: 15 mg/dL (ref 8–23)
CO2: 26 mmol/L (ref 22–32)
Calcium: 9.4 mg/dL (ref 8.9–10.3)
Chloride: 108 mmol/L (ref 98–111)
Creatinine: 1.05 mg/dL — ABNORMAL HIGH (ref 0.44–1.00)
GFR, Estimated: 59 mL/min — ABNORMAL LOW (ref >60)
Glucose, Bld: 117 mg/dL — ABNORMAL HIGH (ref 70–99)
Potassium: 3.9 mmol/L (ref 3.5–5.1)
Sodium: 141 mmol/L (ref 135–145)
Total Bilirubin: 0.6 mg/dL (ref 0.3–1.2)
Total Protein: 7.1 g/dL (ref 6.5–8.1)

## 2021-04-11 LAB — CBC WITH DIFFERENTIAL (CANCER CENTER ONLY)
Abs Immature Granulocytes: 0.01 10*3/uL (ref 0.00–0.07)
Basophils Absolute: 0 10*3/uL (ref 0.0–0.1)
Basophils Relative: 0 %
Eosinophils Absolute: 0 10*3/uL (ref 0.0–0.5)
Eosinophils Relative: 0 %
HCT: 32.2 % — ABNORMAL LOW (ref 36.0–46.0)
Hemoglobin: 10.3 g/dL — ABNORMAL LOW (ref 12.0–15.0)
Immature Granulocytes: 0 %
Lymphocytes Relative: 57 %
Lymphs Abs: 1.3 10*3/uL (ref 0.7–4.0)
MCH: 31 pg (ref 26.0–34.0)
MCHC: 32 g/dL (ref 30.0–36.0)
MCV: 97 fL (ref 80.0–100.0)
Monocytes Absolute: 0.3 10*3/uL (ref 0.1–1.0)
Monocytes Relative: 11 %
Neutro Abs: 0.7 10*3/uL — ABNORMAL LOW (ref 1.7–7.7)
Neutrophils Relative %: 32 %
Platelet Count: 65 10*3/uL — ABNORMAL LOW (ref 150–400)
RBC: 3.32 MIL/uL — ABNORMAL LOW (ref 3.87–5.11)
RDW: 17.4 % — ABNORMAL HIGH (ref 11.5–15.5)
WBC Count: 2.3 10*3/uL — ABNORMAL LOW (ref 4.0–10.5)
nRBC: 0.9 % — ABNORMAL HIGH (ref 0.0–0.2)

## 2021-04-11 LAB — LACTATE DEHYDROGENASE: LDH: 181 U/L (ref 98–192)

## 2021-04-11 LAB — VITAMIN B12: Vitamin B-12: 3296 pg/mL — ABNORMAL HIGH (ref 180–914)

## 2021-04-11 LAB — FERRITIN: Ferritin: 82 ng/mL (ref 11–307)

## 2021-04-17 LAB — JAK2 (INCLUDING V617F AND EXON 12), MPL,& CALR-NEXT GEN SEQ

## 2021-04-18 ENCOUNTER — Encounter: Payer: Self-pay | Admitting: Physician Assistant

## 2021-04-18 ENCOUNTER — Other Ambulatory Visit: Payer: Self-pay

## 2021-04-18 ENCOUNTER — Ambulatory Visit: Payer: 59 | Admitting: Physician Assistant

## 2021-04-18 ENCOUNTER — Ambulatory Visit (INDEPENDENT_AMBULATORY_CARE_PROVIDER_SITE_OTHER): Payer: 59 | Admitting: Orthopaedic Surgery

## 2021-04-18 DIAGNOSIS — I10 Essential (primary) hypertension: Secondary | ICD-10-CM

## 2021-04-18 DIAGNOSIS — I428 Other cardiomyopathies: Secondary | ICD-10-CM

## 2021-04-18 DIAGNOSIS — G8929 Other chronic pain: Secondary | ICD-10-CM | POA: Diagnosis not present

## 2021-04-18 DIAGNOSIS — R55 Syncope and collapse: Secondary | ICD-10-CM

## 2021-04-18 DIAGNOSIS — M76899 Other specified enthesopathies of unspecified lower limb, excluding foot: Secondary | ICD-10-CM | POA: Diagnosis not present

## 2021-04-18 DIAGNOSIS — R002 Palpitations: Secondary | ICD-10-CM

## 2021-04-18 DIAGNOSIS — M25562 Pain in left knee: Secondary | ICD-10-CM

## 2021-04-18 NOTE — Progress Notes (Signed)
? ?Office Visit Note ?  ?Patient: Karen Waller           ?Date of Birth: Jun 29, 1955           ?MRN: [id number] ?Visit Date: 04/18/2021 ?             ?Requested by: Carol Ada, MD ?[address] ?[address] ?[place],  [place] [address] ?PCP: Carol Ada, MD ? ? ?Assessment & Plan: ?Visit Diagnoses:  ?1. Quadriceps tendinitis   ? ? ?Plan: Impression is left knee quadriceps tendinitis.  We have discussed referral to physical therapy in addition to obtaining an MRI to further assess for structural abnormalities.  We have also provided her with a work note to be out for the next 3 weeks.  Follow-up with [place] after the MRI. ? ?Follow-Up Instructions: Return for f/u after MRI.  ? ?Orders:  ?No orders of the defined types were placed in this encounter. ? ?No orders of the defined types were placed in this encounter. ? ? ? ? Procedures: ?No procedures performed ? ? ?Clinical Data: ?No additional findings. ? ? ?Subjective: ?Chief Complaint  ?Patient presents with  ? Left Foot - Pain  ? Left Knee - Pain  ? ? ?HPI patient is a pleasant 66 year old female who comes in today with continued left knee pain.  She has seen [place] 2 previous times where she was diagnosed with quadriceps tendinitis.  She has been unable to take NSAIDs due to neutropenia and has not been able to take time off work where she has a relatively physical job.  She has been using topical NSAIDs without relief. ? ?Review of Systems as detailed in HPI.  All others reviewed and are negative. ? ? ?Objective: ?Vital Signs: There were no vitals taken for this visit. ? ?Physical Exam well-developed well-nourished female no acute distress.  Alert and oriented x3. ? ?Ortho Exam unchanged left knee exam ? ?Specialty Comments:  ?No specialty comments available. ? ?Imaging: ?No new imaging ? ? ?PMFS History: ?Patient Active Problem List  ? Diagnosis Date Noted  ? Iron deficiency anemia 06/22/2019  ? Pain in left hip 03/24/2018  ? Bruising, spontaneous 07/06/2017  ?  Left Achilles tendinitis 11/08/2016  ? Conductive hearing loss, bilateral 08/08/2016  ? Elevated lipids 12/16/2012  ? HTN (hypertension) 01/05/2011  ? CHF (congestive heart failure) ([place]) 10/30/2010  ? Edema 10/30/2010  ? Gouty arthropathy 03/17/2008  ? OVERWEIGHT 03/17/2008  ? ESSENTIAL HYPERTENSION, BENIGN 03/17/2008  ? Non-ischemic cardiomyopathy ([place]) 03/17/2008  ? PALPITATIONS, OCCASIONAL 03/17/2008  ? ?Past Medical History:  ?Diagnosis Date  ? Cardiomyopathy   ? EF 30-35% diagnosed 02/2008  ? Central obesity   ? Gout   ? left knee  ? HTN (hypertension)   ? Pancytopenia ([place])   ?  ?Family History  ?Problem Relation Age of Onset  ? Hypertension Mother   ? Breast cancer Neg Hx   ?  ?Past Surgical History:  ?Procedure Laterality Date  ? KNEE SURGERY    ? TONSILLECTOMY    ? ?Social History  ? ?Occupational History  ? Occupation: [employer]  ?Tobacco Use  ? Smoking status: Former  ?  Types: Cigarettes  ?  Quit date: 02/19/1984  ?  Years since quitting: 37.1  ? Smokeless tobacco: Never  ?Vaping Use  ? Vaping Use: Never used  ?Substance and Sexual Activity  ? Alcohol use: Not Currently  ? Drug use: Never  ? Sexual activity: Not on file  ? ? ? ? ? ? ?

## 2021-04-20 NOTE — Progress Notes (Signed)
? ?Office Visit  ?  ?Patient Name: Karen Waller ?Date of Encounter: 04/24/2021 ? ?PCP:  Carol Ada, MD ?  ?[place]  ?Cardiologist:  Jenkins Rouge, MD  ?Advanced Practice Provider:  No care team member to display ?Electrophysiologist:  None  ? ?HPI  ?  ?Karen Waller is a 66 y.o. female with a hx of palpitations, hypertension, DCM, and hyperlipidemia presents today for annual follow-up appointment. ? ?Patient was last seen February 2022 and at that time she was tolerating her beta-blocker and ARB with no palpitations.  She was compliant with her medications.  She had an MRI back on 01/13/2013 with an EF of 47%, no scar.  TTE was performed 06/2018 with an EF of 55 to 60%, normal valves.  June 2020 monitor showed no PAF some atrial and ventricular ectopy but less than 1% beats and did not correlate with any of her symptoms.  She sees her hematologist regularly for pancytopenia.  At that time her blood pressure was well controlled as well as her palpitations.  She appeared euvolemic on exam.  Since her EF was normal no need for Entresto.  Most recent echocardiogram 01/2021 with EF 50 to 55%. ? ?Today, she states that she is having some fluttering in her chest which is also associated with a sharp fleeting pain in her chest.  It is not associated with exercise.  She also feels her heartbeat in her ear and it is sometimes accompanied by a whooshing sound.  This is happening every day and has been occurring since December.  She also states she cannot lay on her left side because she gets a headache.  Other than that she has been doing well since we have last seen her about a year ago. ? ? ?Reports no shortness of breath nor dyspnea on exertion. Reports no chest pain, pressure, or tightness with exertion. No edema, orthopnea, PND.  ? ?Past Medical History  ?  ?Past Medical History:  ?Diagnosis Date  ? Cardiomyopathy   ? EF 30-35% diagnosed 02/2008  ? Central obesity   ? Gout   ? left knee   ? HTN (hypertension)   ? Pancytopenia ([place])   ? ?Past Surgical History:  ?Procedure Laterality Date  ? KNEE SURGERY    ? TONSILLECTOMY    ? ? ?Allergies ? ?Allergies  ?Allergen Reactions  ? Sulfa Antibiotics Shortness Of Breath  ? Codeine Other (See Comments)  ? ? ? ?EKGs/Labs/Other Studies Reviewed:  ? ?The following studies were reviewed today: ? ?Echocardiogram 02/13/2021 ?IMPRESSIONS  ? ? ? 1. Mild anterior and basal to mid anteroseptal hypokinesis. Left  ?ventricular ejection fraction, by estimation, is 50 to 55%. The left  ?ventricle has low normal function. The left ventricle demonstrates  ?regional wall motion abnormalities (see scoring  ?diagram/findings for description). Left ventricular diastolic parameters  ?are consistent with Grade I diastolic dysfunction (impaired relaxation).  ?The average left ventricular global longitudinal strain is -18.4 %. The  ?global longitudinal strain is  ?normal.  ? 2. Right ventricular systolic function is normal. The right ventricular  ?size is normal. There is normal pulmonary artery systolic pressure.  ? 3. The mitral valve is normal in structure. Trivial mitral valve  ?regurgitation. No evidence of mitral stenosis.  ? 4. The aortic valve is tricuspid. Aortic valve regurgitation is not  ?visualized. No aortic stenosis is present.  ? 5. The inferior vena cava is normal in size with greater than 50%  ?respiratory variability,  suggesting right atrial pressure of 3 mmHg.  ? ?EKG:  EKG is  ordered today.  The ekg ordered today demonstrates NSR rate 77 bpm ? ?Recent Labs: ?04/11/2021: ALT 19; BUN 15; Creatinine 1.05; Hemoglobin 10.3; Platelet Count 65; Potassium 3.9; Sodium 141  ?Recent Lipid Panel ?No results found for: CHOL, TRIG, HDL, CHOLHDL, VLDL, LDLCALC, LDLDIRECT ? ? ?Home Medications  ? ?Current Meds  ?Medication Sig  ? Acetaminophen (TYLENOL ARTHRITIS PAIN PO) Take by mouth as needed.  ? acetaminophen (TYLENOL) 500 MG tablet Take by mouth.  ? B Complex Vitamins (B  COMPLEX 100 PO)   ? candesartan (ATACAND) 16 MG tablet TAKE 1 TABLET BY MOUTH DAILY  ? Cholecalciferol (VITAMIN D3) 2000 units capsule Take 2,000 Units by mouth daily.  ? Multiple Vitamin (MULTIVITAMIN) capsule Take 1 capsule by mouth daily.  ? TIZANIDINE HCL PO Take by mouth. Unknown dosage  ? traMADol (ULTRAM) 50 MG tablet Take 1-2 tablets (50-100 mg total) by mouth daily as needed.  ? [DISCONTINUED] amLODipine (NORVASC) 10 MG tablet TAKE 1 TABLET(10 MG) BY MOUTH DAILY  ? [DISCONTINUED] carvedilol (COREG) 6.25 MG tablet TAKE 1 TABLET BY MOUTH TWICE DAILY WITH A MEAL  ? [DISCONTINUED] furosemide (LASIX) 20 MG tablet TAKE 1/2 TABLET BY MOUTH DAILY  ? [DISCONTINUED] hydrALAZINE (APRESOLINE) 25 MG tablet TAKE 1 TABLET BY MOUTH TWICE DAILY  ?  ? ?Review of Systems  ?    ?All other systems reviewed and are otherwise negative except as noted above. ? ?Physical Exam  ?  ?VS:  BP 110/70   Pulse 77   Ht 5\' 6"  (1.676 m)   Wt 197 lb 6.4 oz (89.5 kg)   SpO2 98%   BMI 31.86 kg/m?  , BMI Body mass index is 31.86 kg/m?. ? ?Wt Readings from Last 3 Encounters:  ?04/24/21 197 lb 6.4 oz (89.5 kg)  ?03/14/21 197 lb 4.8 oz (89.5 kg)  ?09/11/20 195 lb 12.8 oz (88.8 kg)  ?  ? ?GEN: Well nourished, well developed, in no acute distress. ?HEENT: normal. ?Neck: Supple, no JVD, carotid bruits, or masses. ?Cardiac: RRR, + 1/6 systolic murmur, rubs, or gallops. No clubbing, cyanosis, edema.  Radials/PT 2+ and equal bilaterally.  ?Respiratory:  Respirations regular and unlabored, clear to auscultation bilaterally. ?GI: Soft, nontender, nondistended. ?MS: No deformity or atrophy. ?Skin: Warm and dry, no rash. ?Neuro:  Strength and sensation are intact. ?Psych: Normal affect. ? ?Assessment & Plan  ?  ?Hypertension ?-BP well controlled today in the clinic 110/70 ?-Continue to monitor your blood pressure at home ?-Continue GDMT: Coreg, Norvasc, Lasix, and hydralazine ? ?Palpitations ?-Last monitor was 2020 ?-Continues to have fluttering in her  chest frequently and associated sharp pain in her chest. ?-We will order a 14-day ZIO monitor to be placed next week due to upcoming MRI on Friday ?-TSH ordered today ? ?Dilated cardiomyopathy ?-Recent echocardiogram December 2022 showed LVEF 50 to 55%.  No significant valvular disease. ?-Due to systolic murmur on exam today and patient's symptoms we will order an echocardiogram in 3-4 weeks after ZIO monitor ?-carotids without bruit ? ?Pancytopenia with bruising ?-We will try and limit lab work due to thrombocytopenia ? ?Hyperlipidemia ?-Recent LDL 98, HDL 39, triglycerides 97. ?-Repeat lipid panel Aug 2023 ? ?Disposition: Follow up 2-3 months with Jenkins Rouge, MD or APP. ? ?Signed, ?Elgie Collard, PA-C ?04/24/2021, 10:07 AM ?[place]  ?

## 2021-04-24 ENCOUNTER — Other Ambulatory Visit: Payer: Self-pay | Admitting: Family Medicine

## 2021-04-24 ENCOUNTER — Ambulatory Visit (INDEPENDENT_AMBULATORY_CARE_PROVIDER_SITE_OTHER): Payer: 59 | Admitting: Physician Assistant

## 2021-04-24 ENCOUNTER — Encounter: Payer: Self-pay | Admitting: Physician Assistant

## 2021-04-24 ENCOUNTER — Ambulatory Visit (INDEPENDENT_AMBULATORY_CARE_PROVIDER_SITE_OTHER): Payer: 59

## 2021-04-24 ENCOUNTER — Other Ambulatory Visit: Payer: Self-pay

## 2021-04-24 VITALS — BP 110/70 | HR 77 | Ht 66.0 in | Wt 197.4 lb

## 2021-04-24 DIAGNOSIS — R011 Cardiac murmur, unspecified: Secondary | ICD-10-CM | POA: Diagnosis not present

## 2021-04-24 DIAGNOSIS — R55 Syncope and collapse: Secondary | ICD-10-CM

## 2021-04-24 DIAGNOSIS — R002 Palpitations: Secondary | ICD-10-CM

## 2021-04-24 DIAGNOSIS — Z1231 Encounter for screening mammogram for malignant neoplasm of breast: Secondary | ICD-10-CM

## 2021-04-24 DIAGNOSIS — I420 Dilated cardiomyopathy: Secondary | ICD-10-CM

## 2021-04-24 DIAGNOSIS — D61818 Other pancytopenia: Secondary | ICD-10-CM

## 2021-04-24 DIAGNOSIS — E782 Mixed hyperlipidemia: Secondary | ICD-10-CM | POA: Diagnosis not present

## 2021-04-24 DIAGNOSIS — I10 Essential (primary) hypertension: Secondary | ICD-10-CM

## 2021-04-24 LAB — TSH: TSH: 2.14 u[IU]/mL (ref 0.450–4.500)

## 2021-04-24 MED ORDER — HYDRALAZINE HCL 25 MG PO TABS: 25.0000 mg | ORAL_TABLET | Freq: Two times a day (BID) | ORAL | 3 refills | Status: DC

## 2021-04-24 MED ORDER — FUROSEMIDE 20 MG PO TABS: 10.0000 mg | ORAL_TABLET | Freq: Every day | ORAL | 3 refills | Status: DC

## 2021-04-24 MED ORDER — CARVEDILOL 6.25 MG PO TABS: 6.2500 mg | ORAL_TABLET | Freq: Two times a day (BID) | ORAL | 3 refills | Status: DC

## 2021-04-24 MED ORDER — AMLODIPINE BESYLATE 10 MG PO TABS: ORAL_TABLET | ORAL | 3 refills | Status: DC

## 2021-04-24 NOTE — Patient Instructions (Signed)
Medication Instructions:  ?Your physician recommends that you continue on your current medications as directed. Please refer to the Current Medication list given to you today.  ?*If you need a refill on your cardiac medications before your next appointment, please call your pharmacy* ? ? ?Lab Work: ?TSH today ?If you have labs (blood work) drawn today and your tests are completely normal, you will receive your results only by: ?MyChart Message (if you have MyChart) OR ?A paper copy in the mail ?If you have any lab test that is abnormal or we need to change your treatment, we will call you to review the results. ? ? ?Testing/Procedures: ?Your physician has requested that you have an echocardiogram mid April. Echocardiography is a painless test that uses sound waves to create images of your heart. It provides your doctor with information about the size and shape of your heart and how well your heart?s chambers and valves are working. This procedure takes approximately one hour. There are no restrictions for this procedure.  ? ?ZIO XT- Long Term Monitor Instructions ? ?Your physician has requested you wear a ZIO patch monitor for 14 days.  ?This is a single patch monitor. [employer] supplies one patch monitor per enrollment. Additional ?stickers are not available. Please do not apply patch if you will be having a Nuclear Stress Test,  ?Echocardiogram, Cardiac CT, MRI, or Chest Xray during the period you would be wearing the  ?monitor. The patch cannot be worn during these tests. You cannot remove and re-apply the  ?ZIO XT patch monitor.  ?Your ZIO patch monitor will be mailed 3 day USPS to your address on file. It may take 3-5 days  ?to receive your monitor after you have been enrolled.  ?Once you have received your monitor, please review the enclosed instructions. Your monitor  ?has already been registered assigning a specific monitor serial # to you. ? ?Billing and Patient Assistance Program Information ? ?We have  supplied [employer] with any of your insurance information on file for billing purposes. ?[employer] offers a sliding scale Patient Assistance Program for patients that do not have  ?insurance, or whose insurance does not completely cover the cost of the ZIO monitor.  ?You must apply for the Patient Assistance Program to qualify for this discounted rate.  ?To apply, please call [employer] at [phone], select option 4, select option 2, ask to apply for  ?Patient Assistance Program. Theodore Demark will ask your household income, and how many people  ?are in your household. They will quote your out-of-pocket cost based on that information.  ?[employer] will also be able to set up a 47-month interest-free payment plan if needed. ? ?Applying the monitor ?  ?Shave hair from upper left chest.  ?Hold abrader disc by orange tab. Rub abrader in 40 strokes over the upper left chest as  ?indicated in your monitor instructions.  ?Clean area with 4 enclosed alcohol pads. Let dry.  ?Apply patch as indicated in monitor instructions. Patch will be placed under collarbone on left  ?side of chest with arrow pointing upward.  ?Rub patch adhesive wings for 2 minutes. Remove white label marked "1". Remove the white  ?label marked "2". Rub patch adhesive wings for 2 additional minutes.  ?While looking in a mirror, press and release button in center of patch. A small green light will  ?flash 3-4 times. This will be your only indicator that the monitor has been turned on.  ?Do not shower for the first 24 hours. You may  shower after the first 24 hours.  ?Press the button if you feel a symptom. You will hear a small click. Record Date, Time and  ?Symptom in the Patient Logbook.  ?When you are ready to remove the patch, follow instructions on the last 2 pages of Patient  ?Logbook. Stick patch monitor onto the last page of Patient Logbook.  ?Place Patient Logbook in the blue and white box. Use locking tab on box and tape box closed  ?securely. The blue and  white box has prepaid postage on it. Please place it in the mailbox as  ?soon as possible. Your physician should have your test results approximately 7 days after the  ?monitor has been mailed back to [facility].  ?Call [facility] at [phone] if you have questions regarding  ?your ZIO XT patch monitor. Call them immediately if you see an orange light blinking on your  ?monitor.  ?If your monitor falls off in less than 4 days, contact our Monitor department at [phone].  ?If your monitor becomes loose or falls off after 4 days call [employer] at [phone] for  ?suggestions on securing your monitor ? ? ? ?Follow-Up: ?At [facility], you and your health needs are our priority.  As part of our continuing mission to provide you with exceptional heart care, we have created designated Provider Care Teams.  These Care Teams include your primary Cardiologist (physician) and Advanced Practice Providers (APPs -  Physician Assistants and Nurse Practitioners) who all work together to provide you with the care you need, when you need it. ? ? ?Your next appointment:   ?2 month(s) ? ?The format for your next appointment:   ?In Person ? ?Provider:   ?Jenkins Rouge, MD  or an APP ?

## 2021-04-24 NOTE — Progress Notes (Unsigned)
Enrolled for [employer] to mail a ZIO XT long term holter monitor after 04/27/2021, to the patients address on file.  ?

## 2021-04-25 ENCOUNTER — Telehealth: Payer: Self-pay | Admitting: Orthopaedic Surgery

## 2021-04-25 NOTE — Telephone Encounter (Signed)
Pt submitted medical release forms, and $25.00 cash payment to [employer]. Pt states short term forms was faxed. Accepted 04/25/21 ?

## 2021-04-26 ENCOUNTER — Ambulatory Visit
Admission: RE | Admit: 2021-04-26 | Discharge: 2021-04-26 | Disposition: A | Discharge status: Home / Self Care | Payer: 59 | Source: Ambulatory Visit | Attending: Family Medicine | Admitting: Family Medicine

## 2021-04-26 ENCOUNTER — Other Ambulatory Visit: Payer: Self-pay

## 2021-04-26 DIAGNOSIS — Z1231 Encounter for screening mammogram for malignant neoplasm of breast: Secondary | ICD-10-CM

## 2021-04-27 ENCOUNTER — Telehealth: Payer: Self-pay | Admitting: Orthopaedic Surgery

## 2021-04-27 ENCOUNTER — Ambulatory Visit
Admission: RE | Admit: 2021-04-27 | Discharge: 2021-04-27 | Disposition: A | Discharge status: Home / Self Care | Payer: 59 | Source: Ambulatory Visit | Attending: Physician Assistant | Admitting: Physician Assistant

## 2021-04-27 DIAGNOSIS — R011 Cardiac murmur, unspecified: Secondary | ICD-10-CM | POA: Diagnosis not present

## 2021-04-27 DIAGNOSIS — R55 Syncope and collapse: Secondary | ICD-10-CM | POA: Diagnosis not present

## 2021-04-27 DIAGNOSIS — G8929 Other chronic pain: Secondary | ICD-10-CM

## 2021-04-27 DIAGNOSIS — R002 Palpitations: Secondary | ICD-10-CM | POA: Diagnosis not present

## 2021-04-27 NOTE — Telephone Encounter (Signed)
[employer] forms received. To [employer]. 

## 2021-04-30 NOTE — Therapy (Incomplete)
OUTPATIENT PHYSICAL THERAPY LOWER EXTREMITY EVALUATION   Patient Name: Karen Waller MRN: [id number] DOB:October 03, 1955, 66 y.o., female Today's Date: 04/30/2021    Past Medical History:  Diagnosis Date   Cardiomyopathy    EF 30-35% diagnosed 02/2008   Central obesity    Gout    left knee   HTN (hypertension)    Pancytopenia ([place])    Past Surgical History:  Procedure Laterality Date   KNEE SURGERY     TONSILLECTOMY     Patient Active Problem List   Diagnosis Date Noted   Iron deficiency anemia 06/22/2019   Pain in left hip 03/24/2018   Bruising, spontaneous 07/06/2017   Left Achilles tendinitis 11/08/2016   Conductive hearing loss, bilateral 08/08/2016   Elevated lipids 12/16/2012   HTN (hypertension) 01/05/2011   CHF (congestive heart failure) ([place]) 10/30/2010   Edema 10/30/2010   Gouty arthropathy 03/17/2008   OVERWEIGHT 03/17/2008   ESSENTIAL HYPERTENSION, BENIGN 03/17/2008   Non-ischemic cardiomyopathy ([place]) 03/17/2008   PALPITATIONS, OCCASIONAL 03/17/2008    PCP: Carol Ada, MD  REFERRING PROVIDER: Aundra Dubin, PA-C  REFERRING DIAG: ***  THERAPY DIAG:  No diagnosis found.  ONSET DATE: ***  SUBJECTIVE:   SUBJECTIVE STATEMENT: ***  PERTINENT HISTORY: HTN, history of cardiomyopathy, gout  PAIN:  Are you having pain? {OPRCPAIN:27236}  PRECAUTIONS: None  WEIGHT BEARING RESTRICTIONS No  FALLS:  Has patient fallen in last 6 months? No, Number of falls: 0  LIVING ENVIRONMENT: Lives with: {OPRC lives with:25569::"lives with their family"} Lives in: {Lives in:25570} Stairs: {yes/no:20286}; {Stairs:24000} Has following equipment at home: {Assistive devices:23999}  OCCUPATION: ***  PLOF: {PLOF:24004}  PATIENT GOALS ***   OBJECTIVE:   DIAGNOSTIC FINDINGS: ***  PATIENT SURVEYS:  {rehab surveys:24030}  COGNITION:  Overall cognitive status: {cognition:24006}     SENSATION: {sensation:27233}  MUSCLE LENGTH: Hamstrings: Right  *** deg; Left *** deg Thomas test: Right *** deg; Left *** deg  POSTURE:  ***  PALPATION: ***  LE ROM:  {AROM/PROM:27142} ROM Right 04/30/2021 Left 04/30/2021  Hip flexion    Hip extension    Hip abduction    Hip adduction    Hip internal rotation    Hip external rotation    Knee flexion    Knee extension    Ankle dorsiflexion    Ankle plantarflexion    Ankle inversion    Ankle eversion     (Blank rows = not tested)  LE MMT:  MMT Right 04/30/2021 Left 04/30/2021  Hip flexion    Hip extension    Hip abduction    Hip adduction    Hip internal rotation    Hip external rotation    Knee flexion    Knee extension    Ankle dorsiflexion    Ankle plantarflexion    Ankle inversion    Ankle eversion     (Blank rows = not tested)  LOWER EXTREMITY SPECIAL TESTS:  {LEspecialtests:26242}  FUNCTIONAL TESTS:  {Functional tests:24029}  GAIT: Distance walked: *** Assistive device utilized: {Assistive devices:23999} Level of assistance: {Levels of assistance:24026} Comments: ***    TODAY'S TREATMENT: ***   PATIENT EDUCATION:  Education details: PT POC, HEP Person educated: Patient Education method: Consulting civil engineer, Demonstration, Tactile cues, Verbal cues, and Handouts Education comprehension: verbalized understanding   HOME EXERCISE PROGRAM: ***  ASSESSMENT:  CLINICAL IMPRESSION:Patient is a 66 y.o. female who comes to clinic with complaints of left knee pain with mobility, strength and movement coordination deficits that impair their ability to perform usual daily and  recreational functional activities without increase difficulty/symptoms at this time.  Patient to benefit from skilled PT services to address impairments and limitations to improve to previous level of function without restriction secondary to condition.     OBJECTIVE IMPAIRMENTS {opptimpairments:25111}.   ACTIVITY LIMITATIONS {activity limitations:25113}.   PERSONAL FACTORS {Personal  factors:25162} are also affecting patient's functional outcome.    REHAB POTENTIAL: {rehabpotential:25112}  CLINICAL DECISION MAKING: {clinical decision making:25114}  EVALUATION COMPLEXITY: {Evaluation complexity:25115}   GOALS: Goals reviewed with patient? Yes   Short term PT Goals (target date for Short term goals are *** weeks ***) Patient will demonstrate independent use of home exercise program to maintain progress from in clinic treatments. Goal status: New   Long term PT goals (target dates for all long term goals are *** weeks  *** ) Patient will demonstrate/report pain at worst less than or equal to 2/10 to facilitate minimal limitation in daily activity secondary to pain symptoms. Goal status: New  Patient will demonstrate independent use of home exercise program to facilitate ability to maintain/progress functional gains from skilled physical therapy services. Goal status: New  Patient will demonstrate FOTO outcome > or = *** % to indicate reduced disability due to condition. Goal status: New  *** Goal status: New      5.  *** Goal status: New  PLAN: PT FREQUENCY: {rehab frequency:25116}  PT DURATION: {rehab duration:25117}  PLANNED INTERVENTIONS:  Therapeutic exercises, Therapeutic activity, Neuro Muscular re-education, Balance training, Gait training, Patient/Family education, Joint mobilization, Stair training, DME instructions, Dry Needling, Electrical stimulation, Cryotherapy, Moist heat, Taping, Ultrasound, Ionotophoresis '4mg'$ /ml Dexamethasone, and Manual therapy.  All included unless contraindicated  PLAN FOR NEXT SESSION: ***   Oretha Caprice, PT, MPT 04/30/2021, 4:33 PM

## 2021-04-30 NOTE — Progress Notes (Signed)
F/u to discuss

## 2021-05-01 ENCOUNTER — Ambulatory Visit (INDEPENDENT_AMBULATORY_CARE_PROVIDER_SITE_OTHER): Payer: 59

## 2021-05-01 ENCOUNTER — Other Ambulatory Visit: Payer: Self-pay

## 2021-05-01 ENCOUNTER — Encounter: Payer: Self-pay | Admitting: Physical Therapy

## 2021-05-01 ENCOUNTER — Ambulatory Visit (INDEPENDENT_AMBULATORY_CARE_PROVIDER_SITE_OTHER): Payer: 59 | Admitting: Physical Therapy

## 2021-05-01 ENCOUNTER — Ambulatory Visit (INDEPENDENT_AMBULATORY_CARE_PROVIDER_SITE_OTHER): Payer: 59 | Admitting: Orthopaedic Surgery

## 2021-05-01 ENCOUNTER — Encounter: Payer: Self-pay | Admitting: Orthopaedic Surgery

## 2021-05-01 DIAGNOSIS — M25572 Pain in left ankle and joints of left foot: Secondary | ICD-10-CM

## 2021-05-01 DIAGNOSIS — R262 Difficulty in walking, not elsewhere classified: Secondary | ICD-10-CM | POA: Diagnosis not present

## 2021-05-01 DIAGNOSIS — M25562 Pain in left knee: Secondary | ICD-10-CM | POA: Diagnosis not present

## 2021-05-01 DIAGNOSIS — M79672 Pain in left foot: Secondary | ICD-10-CM | POA: Diagnosis not present

## 2021-05-01 DIAGNOSIS — G8929 Other chronic pain: Secondary | ICD-10-CM

## 2021-05-01 DIAGNOSIS — R600 Localized edema: Secondary | ICD-10-CM | POA: Diagnosis not present

## 2021-05-01 MED ORDER — PREDNISONE 5 MG (21) PO TBPK: ORAL_TABLET | ORAL | 0 refills | Status: DC

## 2021-05-01 NOTE — Addendum Note (Signed)
Addended by: Precious Bard on: 05/01/2021 12:49 PM ? ? Modules accepted: Orders ? ?

## 2021-05-01 NOTE — Progress Notes (Signed)
? ?Office Visit Note ?  ?Patient: Karen Waller           ?Date of Birth: [birth year]           ?MRN: [id number] ?Visit Date: 05/01/2021 ?             ?Requested by: Carol Ada, MD ?[address] ?[address] ?[place],  [place] [address] ?PCP: Carol Ada, MD ? ? ?Assessment & Plan: ?Visit Diagnoses:  ?1. Pain in left foot   ?2. Chronic pain of left knee   ? ? ?Plan: Impression is right knee quadriceps tendinosis and left foot pain.  In regards to her knee, she is scheduled to start physical therapy today.  I am hopeful that this will somewhat help.  In regards to the foot, I would like to start her on another steroid pack.  I have also discussed with her that she needs to follow-up with her primary care doctor to discuss daily prophylactic medicine for this.  She will follow-up with [place] as needed. ? ?Follow-Up Instructions: Return if symptoms worsen or fail to improve.  ? ?Orders:  ?Orders Placed This Encounter  ?Procedures  ? XR Foot Complete Left  ? ?No orders of the defined types were placed in this encounter. ? ? ? ? Procedures: ?No procedures performed ? ? ?Clinical Data: ?No additional findings. ? ? ?Subjective: ?Chief Complaint  ?Patient presents with  ? Left Knee - Pain  ? Left Foot - Pain  ? ? ?HPI patient is a pleasant 66 year old female who comes in today to discuss MRI results of the left knee.  She has been dealing with distal quadriceps pain for a while.  At her request an MRI was ordered which showed distal quadriceps tendinosis, mild patellofemoral osteoarthritis and small medial lateral meniscal tears.  She does note her pain has improved but is only localized to the distal quad.  She is scheduled to start physical therapy today.  The other issue she brings up today is left foot pain since the end of February.  She notes around this time, she had a gout attack and her dog also stepped on that foot.  She has had intermittent pain since which slowly occurs when she is wearing shoes or when she  is working.  She has associated slight swelling and erythema.  She denies any fevers or chills.  She has been taking Tylenol. ? ?Review of Systems as detailed in HPI.  All others reviewed and are negative. ? ? ?Objective: ?Vital Signs: There were no vitals taken for this visit. ? ?Physical Exam well-developed well-nourished female in no acute distress.  Alert and oriented x3. ? ?Ortho Exam left foot exam shows mild swelling and erythema.  Moderate tenderness throughout the ball of the foot and across the dorsum of the forefoot.  She has no pain with range of motion of the foot or ankle.  She is neurovascular intact distally. ? ?Specialty Comments:  ?No specialty comments available. ? ?Imaging: ?XR Foot Complete Left ? ?Result Date: 05/01/2021 ?No acute or structural abnormalities  ? ? ?PMFS History: ?Patient Active Problem List  ? Diagnosis Date Noted  ? Iron deficiency anemia 06/22/2019  ? Pain in left hip 03/24/2018  ? Bruising, spontaneous 07/06/2017  ? Left Achilles tendinitis 11/08/2016  ? Conductive hearing loss, bilateral 08/08/2016  ? Elevated lipids 12/16/2012  ? HTN (hypertension) 01/05/2011  ? CHF (congestive heart failure) ([place]) 10/30/2010  ? Edema 10/30/2010  ? Gouty arthropathy 03/17/2008  ?  OVERWEIGHT 03/17/2008  ? ESSENTIAL HYPERTENSION, BENIGN 03/17/2008  ? Non-ischemic cardiomyopathy ([place]) 03/17/2008  ? PALPITATIONS, OCCASIONAL 03/17/2008  ? ?Past Medical History:  ?Diagnosis Date  ? Cardiomyopathy   ? EF 30-35% diagnosed 02/2008  ? Central obesity   ? Gout   ? left knee  ? HTN (hypertension)   ? Pancytopenia ([place])   ?  ?Family History  ?Problem Relation Age of Onset  ? Hypertension Mother   ? Breast cancer Neg Hx   ?  ?Past Surgical History:  ?Procedure Laterality Date  ? KNEE SURGERY    ? TONSILLECTOMY    ? ?Social History  ? ?Occupational History  ? Occupation: [employer]  ?Tobacco Use  ? Smoking status: Former  ?  Types: Cigarettes  ?  Quit date: 02/19/1984  ?  Years since quitting: 37.2  ? Smokeless  tobacco: Never  ?Vaping Use  ? Vaping Use: Never used  ?Substance and Sexual Activity  ? Alcohol use: Not Currently  ? Drug use: Never  ? Sexual activity: Not on file  ? ? ? ? ? ? ?

## 2021-05-02 ENCOUNTER — Telehealth: Payer: Self-pay | Admitting: Orthopaedic Surgery

## 2021-05-02 ENCOUNTER — Encounter: Payer: Self-pay | Admitting: Hematology

## 2021-05-02 LAB — RHEUMATOID FACTOR: Rheumatoid fact SerPl-aCnc: <14 IU/mL (ref <14)

## 2021-05-02 LAB — ANA: Anti Nuclear Antibody (ANA): NEGATIVE

## 2021-05-02 LAB — SEDIMENTATION RATE: Sed Rate: 31 mm/h — ABNORMAL HIGH (ref 0–30)

## 2021-05-02 LAB — URIC ACID: Uric Acid, Serum: 8.7 mg/dL — ABNORMAL HIGH (ref 2.5–7.0)

## 2021-05-02 NOTE — Telephone Encounter (Signed)
Pt submitted medical release form, Intermittent leave forms, and $25.00 cash payment to [employer]. Accepted 05/02/21 ?

## 2021-05-02 NOTE — Progress Notes (Signed)
See results

## 2021-05-03 ENCOUNTER — Encounter: Payer: Self-pay | Admitting: Physical Therapy

## 2021-05-03 ENCOUNTER — Other Ambulatory Visit: Payer: Self-pay

## 2021-05-03 ENCOUNTER — Ambulatory Visit (INDEPENDENT_AMBULATORY_CARE_PROVIDER_SITE_OTHER): Payer: 59 | Admitting: Physical Therapy

## 2021-05-03 DIAGNOSIS — M25572 Pain in left ankle and joints of left foot: Secondary | ICD-10-CM | POA: Diagnosis not present

## 2021-05-03 DIAGNOSIS — R262 Difficulty in walking, not elsewhere classified: Secondary | ICD-10-CM | POA: Diagnosis not present

## 2021-05-03 DIAGNOSIS — G8929 Other chronic pain: Secondary | ICD-10-CM

## 2021-05-03 DIAGNOSIS — M25562 Pain in left knee: Secondary | ICD-10-CM

## 2021-05-03 DIAGNOSIS — R600 Localized edema: Secondary | ICD-10-CM

## 2021-05-03 NOTE — Progress Notes (Signed)
Can you let her know uric acid level elevated which indicates gout like we thought

## 2021-05-03 NOTE — Therapy (Signed)
?OUTPATIENT PHYSICAL THERAPY TREATMENT NOTE ? ? ?Patient Name: Karen Waller ?MRN: [id number] ?DOB:[birth year], 66 y.o., female ?Today's Date: 05/03/2021 ? ?PCP: Carol Ada, MD ?REFERRING PROVIDER: Aundra Dubin, PA-C ? ? PT End of Session - 05/03/21 1542   ? ? Visit Number 2   ? Number of Visits 13   ? Date for PT Re-Evaluation 05/23/21   ? PT Start Time 9357   ? PT Stop Time 0177   ? PT Time Calculation (min) 41 min   ? Activity Tolerance Patient tolerated treatment well   ? Behavior During Therapy [facility] for tasks assessed/performed   ? ?  ?  ? ?  ? ? ?Past Medical History:  ?Diagnosis Date  ? Cardiomyopathy   ? EF 30-35% diagnosed 02/2008  ? Central obesity   ? Gout   ? left knee  ? HTN (hypertension)   ? Pancytopenia ([place])   ? ?Past Surgical History:  ?Procedure Laterality Date  ? KNEE SURGERY    ? TONSILLECTOMY    ? ?Patient Active Problem List  ? Diagnosis Date Noted  ? Iron deficiency anemia 06/22/2019  ? Pain in left hip 03/24/2018  ? Bruising, spontaneous 07/06/2017  ? Left Achilles tendinitis 11/08/2016  ? Conductive hearing loss, bilateral 08/08/2016  ? Elevated lipids 12/16/2012  ? HTN (hypertension) 01/05/2011  ? CHF (congestive heart failure) ([place]) 10/30/2010  ? Edema 10/30/2010  ? Gouty arthropathy 03/17/2008  ? OVERWEIGHT 03/17/2008  ? ESSENTIAL HYPERTENSION, BENIGN 03/17/2008  ? Non-ischemic cardiomyopathy ([place]) 03/17/2008  ? PALPITATIONS, OCCASIONAL 03/17/2008  ? ? ?REFERRING DIAG: M25.562,G89.29 (ICD-10-CM) - Chronic pain of left knee ? ?THERAPY DIAG:  ?Chronic pain of left knee ? ?Pain in left ankle and joints of left foot ? ?Difficulty in walking, not elsewhere classified ? ?Localized edema ? ?PERTINENT HISTORY: HTN, history of cardiomyopathy, gout ? ?PRECAUTIONS: none ? ?SUBJECTIVE: knee is doing pretty well; her Lt foot is more painful at this time (due to gout), no pain in the knee ? ?PAIN:  ?Are you having pain? Yes: NPRS scale: 0-6/10 ?Pain location: Lt foot ?Pain description:  throbbing; acute ?Aggravating factors: lifting foot up ?Relieving factors: avoiding provoking movements ? ? ?OBJECTIVE:  ?  ?PATIENT SURVEYS:  ?FOTO 05/01/2021: 63%, Predicted 74% ?  ?   EDEMA: 05/01/2021  Rt : 42 centimeters, Left 45 centimeters ?  ?  ?LE ROM: ?  ?Active ROM Right ?05/01/2021 Left ?05/01/2021 Left ?05/03/21  ?Hip flexion       ?Hip extension       ?Hip abduction       ?Hip adduction       ?Hip internal rotation       ?Hip external rotation       ?Knee flexion 125 105 128  ?Knee extension 0 0   ?Ankle dorsiflexion 10 5   ?Ankle plantarflexion 20 15   ?Ankle inversion 28 20   ?Ankle eversion 34 25   ? (Blank rows = not tested) ?  ?LE MMT: ?  ?MMT Right ?05/01/2021 Left ?05/01/2021  ?Hip flexion 4-/5 4-/5  ?Hip extension      ?Hip abduction 4-/5 4-/5  ?Hip adduction 4-/5 4-/5  ?Hip internal rotation      ?Hip external rotation      ?Knee flexion 5/5 4-/5  ?Knee extension 4+/5 4-/5  ?Ankle dorsiflexion 5/5 3/5  ?Ankle plantarflexion 5/5 3/5  ?Ankle inversion 5/5 3/5  ?Ankle eversion 5/5 3/5  ? (Blank rows = not tested) ?  ?LOWER  EXTREMITY SPECIAL TESTS:  ?Knee special tests: Anterior drawer test: negative ?  ?FUNCTIONAL TESTS:  ?5 times sit to stand: 23 seconds ?  ?GAIT: ?Distance walked: 25 feet ?Assistive device utilized: None ?Level of assistance: Complete Independence ?Comments: antalgic gait, unable to wear tennis shoe on the left, pt amb with bedroom slipper on left and tennis shoe on right, due to swelling on left ankle ?  ?  ?  ?TODAY'S TREATMENT: ?05/03/21 ?Therex ?    Aerobic: NuStep L6 x 8 min ?    Supine: ? Bridges x 10 reps ? AA heel slide x 10 reps on Lt ? Lt quad sets x 10 reps ? Hooklying single limb clamshell with L3 band 2x10 bil ?    Sitting: ? Lt LAQ x 10 reps (muscle shaking noted) ? Lt SLR x10 reps, 2 sec hold ? Lt ankle alphabet A-Z ? Lt ankle PF and Eversion L3 band x 20 reps ?    Sidelying: ? Lt hip abduction 2x10; mod cues to decrease substitution ? ?05/01/2021 ?HEP  instruction/performance c cues for techniques, handout provided.  Trial set performed of each for comprehension and symptom assessment.  See below for exercise list. ?  ?  ?PATIENT EDUCATION:  ?Education details: PT POC, HEP ?Person educated: Patient ?Education method: Explanation, Demonstration, Tactile cues, Verbal cues, and Handouts ?Education comprehension: verbalized understanding ?  ?  ?HOME EXERCISE PROGRAM: ?Access Code: [email or web address] ?URL: [email or web address] ?Date: 05/01/2021 ?Prepared by: Kearney Hard ?  ?Exercises ?Supine Bridge - 2 x daily - 7 x weekly - 2 sets - 10 reps - 5 seconds hold ?Supine Quad Set on Towel Roll - 2 x daily - 7 x weekly - 2 sets - 10 reps - 5 seconds hold ?Supine Heel Slide with Strap - 2 x daily - 7 x weekly - 10 reps - 5 seconds hold ?Seated Knee Flexion Extension AROM - 2 x daily - 7 x weekly - 2 sets - 10 reps ?Seated Ankle Alphabet - 2 x daily - 7 x weekly - 1 reps ?  ?  ?ASSESSMENT: ?  ?CLINICAL IMPRESSION: ?Pt tolerated session well today demonstrating improvement in Lt knee AROM back to normal flexion.  Will continue to benefit from PT to maximize function.  Added hip abduction exercises today as well. ? ?  ?OBJECTIVE IMPAIRMENTS Abnormal gait, decreased activity tolerance, decreased balance, decreased mobility, difficulty walking, decreased ROM, decreased strength, increased edema, impaired flexibility, and pain.  ?  ?ACTIVITY LIMITATIONS cleaning, community activity, and occupation.  ?  ?PERSONAL FACTORS HTN, history of cardiomyopathy, gout are also affecting patient's functional outcome.  ?  ?  ?REHAB POTENTIAL: Good ?  ?CLINICAL DECISION MAKING: Stable/uncomplicated ?  ?EVALUATION COMPLEXITY: Low ?  ?  ?GOALS: ?Goals reviewed with patient? Yes ?  ?  ?Short term PT Goals (target date for Short term goals are 3 weeks 05/23/2021) ?Patient will demonstrate independent use of home exercise program to maintain progress from in clinic treatments. ?Goal status:  New ?  ?Long term PT goals (target dates for all long term goals are 6 weeks  06/15/2021 ) ?Patient will demonstrate/report pain at worst less than or equal to 2/10 to facilitate minimal limitation in daily activity secondary to pain symptoms. ?Goal status: New ?  ?Patient will demonstrate independent use of home exercise program to facilitate ability to maintain/progress functional gains from skilled physical therapy services. ?Goal status: New ?  ?Patient will demonstrate FOTO outcome > or = 74 % to indicate reduced disability  due to condition. ?Goal status: New ?  ?Pt will improve left knee flexion to >/= 120 degrees for improved functional mobility.  ?Goal status: New ?  ?    5.  Pt will be able to amb 1000 feet community surfaces and able to navigate curb step with </= 2/10 pain with step through gait pattern with no device.  ?Goal status: New ?  ?PLAN: ?PT FREQUENCY: 2x/week ?  ?PT DURATION: 6 weeks ?  ?PLANNED INTERVENTIONS:  ?Therapeutic exercises, Therapeutic activity, Neuro Muscular re-education, Balance training, Gait training, Patient/Family education, Joint mobilization, Stair training, DME instructions, Dry Needling, Electrical stimulation, Cryotherapy, Moist heat, Taping, Ultrasound, Ionotophoresis '4mg'$ /ml Dexamethasone, and Manual therapy.  All included unless contraindicated ?  ?PLAN FOR NEXT SESSION: continue general LLE strengthening, NuStep  ? ? ?Laureen Abrahams, PT, DPT ?05/03/21 3:57 PM ? ? ?  ? ?

## 2021-05-04 NOTE — Progress Notes (Signed)
Patient aware.

## 2021-05-07 ENCOUNTER — Other Ambulatory Visit: Payer: Self-pay

## 2021-05-07 DIAGNOSIS — D469 Myelodysplastic syndrome, unspecified: Secondary | ICD-10-CM

## 2021-05-08 ENCOUNTER — Other Ambulatory Visit: Payer: Self-pay

## 2021-05-08 ENCOUNTER — Encounter: Payer: Self-pay | Admitting: Physical Therapy

## 2021-05-08 ENCOUNTER — Ambulatory Visit (INDEPENDENT_AMBULATORY_CARE_PROVIDER_SITE_OTHER): Payer: 59 | Admitting: Physical Therapy

## 2021-05-08 DIAGNOSIS — M25572 Pain in left ankle and joints of left foot: Secondary | ICD-10-CM

## 2021-05-08 DIAGNOSIS — G8929 Other chronic pain: Secondary | ICD-10-CM

## 2021-05-08 DIAGNOSIS — R600 Localized edema: Secondary | ICD-10-CM

## 2021-05-08 DIAGNOSIS — M25562 Pain in left knee: Secondary | ICD-10-CM | POA: Diagnosis not present

## 2021-05-08 DIAGNOSIS — R262 Difficulty in walking, not elsewhere classified: Secondary | ICD-10-CM

## 2021-05-08 NOTE — Therapy (Signed)
?OUTPATIENT PHYSICAL THERAPY TREATMENT NOTE ? ? ?Patient Name: Karen Waller ?MRN: [id number] ?DOB:03/23/1955, 66 y.o., female ?Today's Date: 05/08/2021 ? ?PCP: Carol Ada, MD ?REFERRING PROVIDER: Aundra Dubin, PA-C ? ? PT End of Session - 05/08/21 0831   ? ? Visit Number 3   ? Number of Visits 13   ? Date for PT Re-Evaluation 05/23/21   ? PT Start Time 0800   ? PT Stop Time 0840   ? PT Time Calculation (min) 40 min   ? Activity Tolerance Patient tolerated treatment well   ? Behavior During Therapy [facility] for tasks assessed/performed   ? ?  ?  ? ?  ? ? ? ?Past Medical History:  ?Diagnosis Date  ? Cardiomyopathy   ? EF 30-35% diagnosed 02/2008  ? Central obesity   ? Gout   ? left knee  ? HTN (hypertension)   ? Pancytopenia ([place])   ? ?Past Surgical History:  ?Procedure Laterality Date  ? KNEE SURGERY    ? TONSILLECTOMY    ? ?Patient Active Problem List  ? Diagnosis Date Noted  ? Iron deficiency anemia 06/22/2019  ? Pain in left hip 03/24/2018  ? Bruising, spontaneous 07/06/2017  ? Left Achilles tendinitis 11/08/2016  ? Conductive hearing loss, bilateral 08/08/2016  ? Elevated lipids 12/16/2012  ? HTN (hypertension) 01/05/2011  ? CHF (congestive heart failure) ([place]) 10/30/2010  ? Edema 10/30/2010  ? Gouty arthropathy 03/17/2008  ? OVERWEIGHT 03/17/2008  ? ESSENTIAL HYPERTENSION, BENIGN 03/17/2008  ? Non-ischemic cardiomyopathy ([place]) 03/17/2008  ? PALPITATIONS, OCCASIONAL 03/17/2008  ? ? ?REFERRING DIAG: M25.562,G89.29 (ICD-10-CM) - Chronic pain of left knee ? ?THERAPY DIAG:  ?Chronic pain of left knee ? ?Pain in left ankle and joints of left foot ? ?Difficulty in walking, not elsewhere classified ? ?Localized edema ? ?PERTINENT HISTORY: HTN, history of cardiomyopathy, gout ? ?PRECAUTIONS: none ? ?SUBJECTIVE: overall feeling better; knee is still a little tender.  Foot is better, able to wear a shoe now  ? ?PAIN:  ?Are you having pain? No ? ? ?OBJECTIVE:  ?  ?PATIENT SURVEYS:  ?FOTO 05/01/2021: 63%, Predicted 74% ?   ?   EDEMA: 05/01/2021  Rt : 42 centimeters, Left 45 centimeters ?  ?  ?LE ROM: ?  ?Active ROM Right ?05/01/2021 Left ?05/01/2021 Left ?05/03/21  ?Hip flexion       ?Hip extension       ?Hip abduction       ?Hip adduction       ?Hip internal rotation       ?Hip external rotation       ?Knee flexion 125 105 128  ?Knee extension 0 0   ?Ankle dorsiflexion 10 5   ?Ankle plantarflexion 20 15   ?Ankle inversion 28 20   ?Ankle eversion 34 25   ? (Blank rows = not tested) ?  ?LE MMT: ?  ?MMT Right ?05/01/2021 Left ?05/01/2021  ?Hip flexion 4-/5 4-/5  ?Hip extension      ?Hip abduction 4-/5 4-/5  ?Hip adduction 4-/5 4-/5  ?Hip internal rotation      ?Hip external rotation      ?Knee flexion 5/5 4-/5  ?Knee extension 4+/5 4-/5  ?Ankle dorsiflexion 5/5 3/5  ?Ankle plantarflexion 5/5 3/5  ?Ankle inversion 5/5 3/5  ?Ankle eversion 5/5 3/5  ? (Blank rows = not tested) ?  ?LOWER EXTREMITY SPECIAL TESTS:  ?Knee special tests: Anterior drawer test: negative ?  ?FUNCTIONAL TESTS:  ?5 times sit to stand: 23  seconds ?  ?GAIT: ?Distance walked: 25 feet ?Assistive device utilized: None ?Level of assistance: Complete Independence ?Comments: antalgic gait, unable to wear tennis shoe on the left, pt amb with bedroom slipper on left and tennis shoe on right, due to swelling on left ankle ?  ?  ?  ?TODAY'S TREATMENT: ?05/08/21 ?Self Care: ? Discussed gout and provided up-to-date handout for lifestyle modifications ?Therapeutic Activities: ? Floor transfers with supervision and mod cues for technique, utilized chair to assist with standing ?Therex: ? Lt LAQ 2x10; 5# with 3 sec hold ? Sit to/from stand without UE support 2x10 ? NuStep L6 x 8 min ? LLE Leg Press 31# 2x10 ? Calf raises x 20 reps ? Standing hip abdct x 10 reps each, bil UE support ? Standing hip ext x 10 reps each, bil UE support ?  ? ?05/03/21 ?Therex ?    Aerobic: NuStep L6 x 8 min ?    Supine: ? Bridges x 10 reps ? AA heel slide x 10 reps on Lt ? Lt quad sets x 10 reps ? Hooklying  single limb clamshell with L3 band 2x10 bil ?    Sitting: ? Lt LAQ x 10 reps (muscle shaking noted) ? Lt SLR x10 reps, 2 sec hold ? Lt ankle alphabet A-Z ? Lt ankle PF and Eversion L3 band x 20 reps ?    Sidelying: ? Lt hip abduction 2x10; mod cues to decrease substitution ? ?05/01/2021 ?HEP instruction/performance c cues for techniques, handout provided.  Trial set performed of each for comprehension and symptom assessment.  See below for exercise list. ?  ?  ?PATIENT EDUCATION:  ?Education details: PT POC, HEP ?Person educated: Patient ?Education method: Explanation, Demonstration, Tactile cues, Verbal cues, and Handouts ?Education comprehension: verbalized understanding ?  ?  ?HOME EXERCISE PROGRAM: ?Access Code: [email or web address] ?URL: [email or web address] ?Date: 05/01/2021 ?Prepared by: Kearney Hard ?  ?Exercises ?Supine Bridge - 2 x daily - 7 x weekly - 2 sets - 10 reps - 5 seconds hold ?Supine Quad Set on Towel Roll - 2 x daily - 7 x weekly - 2 sets - 10 reps - 5 seconds hold ?Supine Heel Slide with Strap - 2 x daily - 7 x weekly - 10 reps - 5 seconds hold ?Seated Knee Flexion Extension AROM - 2 x daily - 7 x weekly - 2 sets - 10 reps ?Seated Ankle Alphabet - 2 x daily - 7 x weekly - 1 reps ?  ?  ?ASSESSMENT: ?  ?CLINICAL IMPRESSION: ?Pt demonstrating improvement in activity tolerance and functional mobility.  Feel pain is largely associated with gout flare, and once pt better able to manage these symptoms and follow up with MD for additional recs she will feel much better.  Will continue to benefit from PT to maximize function. ?  ?OBJECTIVE IMPAIRMENTS Abnormal gait, decreased activity tolerance, decreased balance, decreased mobility, difficulty walking, decreased ROM, decreased strength, increased edema, impaired flexibility, and pain.  ?  ?ACTIVITY LIMITATIONS cleaning, community activity, and occupation.  ?  ?PERSONAL FACTORS HTN, history of cardiomyopathy, gout are also affecting patient's  functional outcome.  ?  ?  ?REHAB POTENTIAL: Good ?  ?CLINICAL DECISION MAKING: Stable/uncomplicated ?  ?EVALUATION COMPLEXITY: Low ?  ?  ?GOALS: ?Goals reviewed with patient? Yes ?  ?  ?Short term PT Goals (target date for Short term goals are 3 weeks 05/23/2021) ?Patient will demonstrate independent use of home exercise program to maintain progress from in clinic treatments. ?Goal status: New ?  ?  Long term PT goals (target dates for all long term goals are 6 weeks  06/15/2021 ) ?Patient will demonstrate/report pain at worst less than or equal to 2/10 to facilitate minimal limitation in daily activity secondary to pain symptoms. ?Goal status: New ?  ?Patient will demonstrate independent use of home exercise program to facilitate ability to maintain/progress functional gains from skilled physical therapy services. ?Goal status: New ?  ?Patient will demonstrate FOTO outcome > or = 74 % to indicate reduced disability due to condition. ?Goal status: New ?  ?Pt will improve left knee flexion to >/= 120 degrees for improved functional mobility.  ?Goal status: New ?  ?    5.  Pt will be able to amb 1000 feet community surfaces and able to navigate curb step with </= 2/10 pain with step through gait pattern with no device.  ?Goal status: New ?  ?PLAN: ?PT FREQUENCY: 2x/week ?  ?PT DURATION: 6 weeks ?  ?PLANNED INTERVENTIONS:  ?Therapeutic exercises, Therapeutic activity, Neuro Muscular re-education, Balance training, Gait training, Patient/Family education, Joint mobilization, Stair training, DME instructions, Dry Needling, Electrical stimulation, Cryotherapy, Moist heat, Taping, Ultrasound, Ionotophoresis '4mg'$ /ml Dexamethasone, and Manual therapy.  All included unless contraindicated ?  ?PLAN FOR NEXT SESSION: continue general LLE strengthening, NuStep (may be able to d/c early) ? ? ?Laureen Abrahams, PT, DPT ?05/08/21 8:42 AM ? ? ?  ? ?

## 2021-05-09 ENCOUNTER — Telehealth: Payer: Self-pay | Admitting: Orthopaedic Surgery

## 2021-05-09 ENCOUNTER — Inpatient Hospital Stay: Payer: 59 | Attending: Hematology

## 2021-05-09 DIAGNOSIS — D696 Thrombocytopenia, unspecified: Secondary | ICD-10-CM | POA: Diagnosis present

## 2021-05-09 DIAGNOSIS — D469 Myelodysplastic syndrome, unspecified: Secondary | ICD-10-CM | POA: Diagnosis present

## 2021-05-09 LAB — CMP (CANCER CENTER ONLY)
ALT: 20 U/L (ref 0–44)
AST: 13 U/L — ABNORMAL LOW (ref 15–41)
Albumin: 4.1 g/dL (ref 3.5–5.0)
Alkaline Phosphatase: 70 U/L (ref 38–126)
Anion gap: 7 (ref 5–15)
BUN: 23 mg/dL (ref 8–23)
CO2: 27 mmol/L (ref 22–32)
Calcium: 9.5 mg/dL (ref 8.9–10.3)
Chloride: 107 mmol/L (ref 98–111)
Creatinine: 1.05 mg/dL — ABNORMAL HIGH (ref 0.44–1.00)
GFR, Estimated: 59 mL/min — ABNORMAL LOW (ref >60)
Glucose, Bld: 89 mg/dL (ref 70–99)
Potassium: 4.2 mmol/L (ref 3.5–5.1)
Sodium: 141 mmol/L (ref 135–145)
Total Bilirubin: 0.5 mg/dL (ref 0.3–1.2)
Total Protein: 6.9 g/dL (ref 6.5–8.1)

## 2021-05-09 LAB — CBC WITH DIFFERENTIAL (CANCER CENTER ONLY)
Abs Immature Granulocytes: 0.01 10*3/uL (ref 0.00–0.07)
Basophils Absolute: 0 10*3/uL (ref 0.0–0.1)
Basophils Relative: 0 %
Eosinophils Absolute: 0 10*3/uL (ref 0.0–0.5)
Eosinophils Relative: 0 %
HCT: 32.9 % — ABNORMAL LOW (ref 36.0–46.0)
Hemoglobin: 10.4 g/dL — ABNORMAL LOW (ref 12.0–15.0)
Immature Granulocytes: 0 %
Lymphocytes Relative: 67 %
Lymphs Abs: 2.4 10*3/uL (ref 0.7–4.0)
MCH: 30.5 pg (ref 26.0–34.0)
MCHC: 31.6 g/dL (ref 30.0–36.0)
MCV: 96.5 fL (ref 80.0–100.0)
Monocytes Absolute: 0.5 10*3/uL (ref 0.1–1.0)
Monocytes Relative: 14 %
Neutro Abs: 0.7 10*3/uL — ABNORMAL LOW (ref 1.7–7.7)
Neutrophils Relative %: 19 %
Platelet Count: UNDETERMINED 10*3/uL (ref 150–400)
RBC: 3.41 MIL/uL — ABNORMAL LOW (ref 3.87–5.11)
RDW: 17 % — ABNORMAL HIGH (ref 11.5–15.5)
WBC Count: 3.7 10*3/uL — ABNORMAL LOW (ref 4.0–10.5)
nRBC: 1.1 % — ABNORMAL HIGH (ref 0.0–0.2)

## 2021-05-09 LAB — VITAMIN B12: Vitamin B-12: 3402 pg/mL — ABNORMAL HIGH (ref 180–914)

## 2021-05-09 LAB — FERRITIN: Ferritin: 61 ng/mL (ref 11–307)

## 2021-05-09 NOTE — Telephone Encounter (Signed)
Form ready for pick up. Patient aware. ? ?

## 2021-05-09 NOTE — Telephone Encounter (Signed)
Pt called and states she is suppose to be picking up a light duty back to work note.  ? ?CB [phone]  ?

## 2021-05-14 ENCOUNTER — Encounter: Payer: Self-pay | Admitting: Hematology

## 2021-05-14 LAB — JAK2 (INCLUDING V617F AND EXON 12), MPL,& CALR-NEXT GEN SEQ

## 2021-05-15 ENCOUNTER — Other Ambulatory Visit: Payer: Self-pay

## 2021-05-15 ENCOUNTER — Ambulatory Visit (INDEPENDENT_AMBULATORY_CARE_PROVIDER_SITE_OTHER): Payer: 59 | Admitting: Physical Therapy

## 2021-05-15 ENCOUNTER — Encounter: Payer: Self-pay | Admitting: Physical Therapy

## 2021-05-15 DIAGNOSIS — M25572 Pain in left ankle and joints of left foot: Secondary | ICD-10-CM

## 2021-05-15 DIAGNOSIS — G8929 Other chronic pain: Secondary | ICD-10-CM

## 2021-05-15 DIAGNOSIS — R262 Difficulty in walking, not elsewhere classified: Secondary | ICD-10-CM

## 2021-05-15 DIAGNOSIS — M25562 Pain in left knee: Secondary | ICD-10-CM | POA: Diagnosis not present

## 2021-05-15 DIAGNOSIS — R600 Localized edema: Secondary | ICD-10-CM | POA: Diagnosis not present

## 2021-05-15 NOTE — Therapy (Signed)
?OUTPATIENT PHYSICAL THERAPY TREATMENT NOTE ? ? ?Patient Name: Karen Waller ?MRN: [id number] ?DOB:11-Jan-1956, 66 y.o., female ?Today's Date: 05/15/2021 ? ?PCP: Carol Ada, MD ?REFERRING PROVIDER: Aundra Dubin, PA-C ? ? PT End of Session - 05/15/21 1549   ? ? Visit Number 4   ? Number of Visits 13   ? Date for PT Re-Evaluation 05/23/21   ? PT Start Time 1520   ? PT Stop Time 1600   ? PT Time Calculation (min) 40 min   ? Activity Tolerance Patient tolerated treatment well   ? Behavior During Therapy [facility] for tasks assessed/performed   ? ?  ?  ? ?  ? ? ? ? ?Past Medical History:  ?Diagnosis Date  ? Cardiomyopathy   ? EF 30-35% diagnosed 02/2008  ? Central obesity   ? Gout   ? left knee  ? HTN (hypertension)   ? Pancytopenia ([place])   ? ?Past Surgical History:  ?Procedure Laterality Date  ? KNEE SURGERY    ? TONSILLECTOMY    ? ?Patient Active Problem List  ? Diagnosis Date Noted  ? Iron deficiency anemia 06/22/2019  ? Pain in left hip 03/24/2018  ? Bruising, spontaneous 07/06/2017  ? Left Achilles tendinitis 11/08/2016  ? Conductive hearing loss, bilateral 08/08/2016  ? Elevated lipids 12/16/2012  ? HTN (hypertension) 01/05/2011  ? CHF (congestive heart failure) ([place]) 10/30/2010  ? Edema 10/30/2010  ? Gouty arthropathy 03/17/2008  ? OVERWEIGHT 03/17/2008  ? ESSENTIAL HYPERTENSION, BENIGN 03/17/2008  ? Non-ischemic cardiomyopathy ([place]) 03/17/2008  ? PALPITATIONS, OCCASIONAL 03/17/2008  ? ? ?REFERRING DIAG: M25.562,G89.29 (ICD-10-CM) - Chronic pain of left knee ? ?THERAPY DIAG:  ?Chronic pain of left knee ? ?Pain in left ankle and joints of left foot ? ?Difficulty in walking, not elsewhere classified ? ?Localized edema ? ?PERTINENT HISTORY: HTN, history of cardiomyopathy, gout ? ?PRECAUTIONS: none ? ?SUBJECTIVE: Pt still reporting pain with stair navigation. Pt has been progressing her home walking to 2 miles 3 times each week.  ? ?PAIN:  ?Are you having pain? No at rest, pt reporting pain of 7-8/10 pain in knees  with stair navigation. Pt reporting dull achy pain on the left knee and sharp pain on the right medial knee with stair navigation. Pt stating pain is worse when ascending.  ? ? ?OBJECTIVE:  ?  ?PATIENT SURVEYS:  ?FOTO 05/01/2021: 63%, (Predicted 74%) ?  ?   EDEMA: 05/01/2021  Rt : 42 centimeters, Left 45 centimeters ?        05/15/2021: Rt:  41.5 centimeters, Left 43  centimeters ?  ?  ?LE ROM: ?  ?Active ROM Right ?05/01/2021 Left ?05/01/2021 Left ?05/03/21 Left ?05/15/2021  ?Hip flexion        ?Hip extension        ?Hip abduction        ?Hip adduction        ?Hip internal rotation        ?Hip external rotation        ?Knee flexion 125 105 128 130  ?Knee extension 0 0  0  ?Ankle dorsiflexion 10 5    ?Ankle plantarflexion 20 15    ?Ankle inversion 28 20    ?Ankle eversion 34 25    ? (Blank rows = not tested) ?  ?LE MMT: ?  ?MMT Right ?05/01/2021 Left ?05/01/2021  ?Hip flexion 4-/5 4-/5  ?Hip extension      ?Hip abduction 4-/5 4-/5  ?Hip adduction 4-/5 4-/5  ?Hip internal  rotation      ?Hip external rotation      ?Knee flexion 5/5 4-/5  ?Knee extension 4+/5 4-/5  ?Ankle dorsiflexion 5/5 3/5  ?Ankle plantarflexion 5/5 3/5  ?Ankle inversion 5/5 3/5  ?Ankle eversion 5/5 3/5  ? (Blank rows = not tested) ?  ?LOWER EXTREMITY SPECIAL TESTS:  ?Knee special tests: Anterior drawer test: negative ?  ?FUNCTIONAL TESTS:  ?5 times sit to stand: 23 seconds ?  ?GAIT: ?Distance walked: 25 feet ?Assistive device utilized: None ?Level of assistance: Complete Independence ?Comments: antalgic gait, unable to wear tennis shoe on the left, pt amb with bedroom slipper on left and tennis shoe on right, due to swelling on left ankle ?  ?  ?  ?TODAY'S TREATMENT: ?05/15/21 ?Therex: ? Lt LAQ 2x10; 5# with 3 sec hold ? Sit to/from stand without UE support 2x10 ? Wall squats with red physioball behind pt's back 2 x10 ? NuStep L6 x 8 min ? LLE Leg Press 31# 3 x10 ? Calf raises x 20 reps ? Standing hip abdct x 10 reps each, bil UE support ? Standing hip ext x  10 reps each, bil UE support ? Step ups on 4 inch step x 15 leading with each LE no UE support ? Bridges: x 10 holding 3 and cues for PPT prior to elevation ? ? ? ?05/08/21 ?Self Care: ? Discussed gout and provided up-to-date handout for lifestyle modifications ?Therapeutic Activities: ? Floor transfers with supervision and mod cues for technique, utilized chair to assist with standing ?Therex: ? Lt LAQ 2x10; 5# with 3 sec hold ? Sit to/from stand without UE support 2x10 ? NuStep L6 x 8 min ? LLE Leg Press 31# 2x10 ? Calf raises x 20 reps ? Standing hip abdct x 10 reps each, bil UE support ? Standing hip ext x 10 reps each, bil UE support ?  ? ?05/03/21 ?Therex ?    Aerobic: NuStep L6 x 8 min ?    Supine: ? Bridges x 10 reps ? AA heel slide x 10 reps on Lt ? Lt quad sets x 10 reps ? Hooklying single limb clamshell with L3 band 2x10 bil ?    Sitting: ? Lt LAQ x 10 reps (muscle shaking noted) ? Lt SLR x10 reps, 2 sec hold ? Lt ankle alphabet A-Z ? Lt ankle PF and Eversion L3 band x 20 reps ?    Sidelying: ? Lt hip abduction 2x10; mod cues to decrease substitution ? ?05/01/2021 ?HEP instruction/performance c cues for techniques, handout provided.  Trial set performed of each for comprehension and symptom assessment.  See below for exercise list. ?  ?  ?PATIENT EDUCATION:  ?Education details: PT POC, HEP ?Person educated: Patient ?Education method: Explanation, Demonstration, Tactile cues, Verbal cues, and Handouts ?Education comprehension: verbalized understanding ?  ?  ?HOME EXERCISE PROGRAM: ?Access Code: [email or web address] ?URL: [email or web address] ?Date: 05/01/2021 ?Prepared by: Kearney Hard ?  ?Exercises ?Supine Bridge - 2 x daily - 7 x weekly - 2 sets - 10 reps - 5 seconds hold ?Supine Quad Set on Towel Roll - 2 x daily - 7 x weekly - 2 sets - 10 reps - 5 seconds hold ?Supine Heel Slide with Strap - 2 x daily - 7 x weekly - 10 reps - 5 seconds hold ?Seated Knee Flexion Extension AROM - 2 x daily - 7 x  weekly - 2 sets - 10 reps ?Seated Ankle Alphabet - 2 x daily - 7 x weekly - 1  reps ?  ?  ?ASSESSMENT: ?  ?CLINICAL IMPRESSION: ?05/15/2021:  ?Pt arriving today reporting no pain. Pt stating pain going up and down steps her pain increases to 7-8/10. Pt still reporting her right knee feels like it's going to buckle at times. Treatment focusing on LE strengthening and functional mobility. continue to benefit from PT to maximize function. ?  ?OBJECTIVE IMPAIRMENTS Abnormal gait, decreased activity tolerance, decreased balance, decreased mobility, difficulty walking, decreased ROM, decreased strength, increased edema, impaired flexibility, and pain.  ?  ?ACTIVITY LIMITATIONS cleaning, community activity, and occupation.  ?  ?PERSONAL FACTORS HTN, history of cardiomyopathy, gout are also affecting patient's functional outcome.  ?  ?  ?REHAB POTENTIAL: Good ?  ?CLINICAL DECISION MAKING: Stable/uncomplicated ?  ?EVALUATION COMPLEXITY: Low ?  ?  ?GOALS: ?Goals reviewed with patient? Yes ?  ?  ?Short term PT Goals (target date for Short term goals are 3 weeks 05/23/2021) ?Patient will demonstrate independent use of home exercise program to maintain progress from in clinic treatments. ?Goal status: Met 05/15/2021 ?  ?Long term PT goals (target dates for all long term goals are 6 weeks  06/15/2021 ) ?Patient will demonstrate/report pain at worst less than or equal to 2/10 to facilitate minimal limitation in daily activity secondary to pain symptoms. ?Goal status: New ?  ?Patient will demonstrate independent use of home exercise program to facilitate ability to maintain/progress functional gains from skilled physical therapy services. ?Goal status: New ?  ?Patient will demonstrate FOTO outcome > or = 74 % to indicate reduced disability due to condition. ?Goal status: New ?  ?Pt will improve left knee flexion to >/= 120 degrees for improved functional mobility.  ?Goal status: New ?  ?    5.  Pt will be able to amb 1000 feet community  surfaces and able to navigate curb step with </= 2/10 pain with step through gait pattern with no device.  ?Goal status: New ?  ?PLAN: ?PT FREQUENCY: 2x/week ?  ?PT DURATION: 6 weeks ?  ?PLANNED INTERVENTIO

## 2021-05-18 ENCOUNTER — Encounter: Payer: Self-pay | Admitting: Physical Therapy

## 2021-05-18 ENCOUNTER — Ambulatory Visit (INDEPENDENT_AMBULATORY_CARE_PROVIDER_SITE_OTHER): Payer: 59 | Admitting: Physical Therapy

## 2021-05-18 DIAGNOSIS — R600 Localized edema: Secondary | ICD-10-CM

## 2021-05-18 DIAGNOSIS — G8929 Other chronic pain: Secondary | ICD-10-CM

## 2021-05-18 DIAGNOSIS — R262 Difficulty in walking, not elsewhere classified: Secondary | ICD-10-CM

## 2021-05-18 DIAGNOSIS — M25562 Pain in left knee: Secondary | ICD-10-CM

## 2021-05-18 DIAGNOSIS — M25572 Pain in left ankle and joints of left foot: Secondary | ICD-10-CM | POA: Diagnosis not present

## 2021-05-18 NOTE — Therapy (Addendum)
OUTPATIENT PHYSICAL THERAPY TREATMENT NOTE DISCHARGE SUMMARY   Patient Name: Karen Waller MRN: [id number] DOB:[birth year], 66 y.o., female Today's Date: 05/18/2021  PCP: Carol Ada, MD REFERRING PROVIDER: Nathaniel Man   PT End of Session - 05/18/21 0853     Visit Number 5    Number of Visits 13    Date for PT Re-Evaluation 05/23/21    PT Start Time 0850    PT Stop Time 0923    PT Time Calculation (min) 33 min    Activity Tolerance Patient tolerated treatment well    Behavior During Therapy [facility] for tasks assessed/performed                Past Medical History:  Diagnosis Date   Cardiomyopathy    EF 30-35% diagnosed 02/2008   Central obesity    Gout    left knee   HTN (hypertension)    Pancytopenia ([place])    Past Surgical History:  Procedure Laterality Date   KNEE SURGERY     TONSILLECTOMY     Patient Active Problem List   Diagnosis Date Noted   Iron deficiency anemia 06/22/2019   Pain in left hip 03/24/2018   Bruising, spontaneous 07/06/2017   Left Achilles tendinitis 11/08/2016   Conductive hearing loss, bilateral 08/08/2016   Elevated lipids 12/16/2012   HTN (hypertension) 01/05/2011   CHF (congestive heart failure) ([place]) 10/30/2010   Edema 10/30/2010   Gouty arthropathy 03/17/2008   OVERWEIGHT 03/17/2008   ESSENTIAL HYPERTENSION, BENIGN 03/17/2008   Non-ischemic cardiomyopathy ([place]) 03/17/2008   PALPITATIONS, OCCASIONAL 03/17/2008    REFERRING DIAG: M25.562,G89.29 (ICD-10-CM) - Chronic pain of left knee  THERAPY DIAG:  Chronic pain of left knee  Pain in left ankle and joints of left foot  Difficulty in walking, not elsewhere classified  Localized edema  PERTINENT HISTORY: HTN, history of cardiomyopathy, gout  PRECAUTIONS: none  SUBJECTIVE: went back to work, and knees are a little sore.  Has returned to walking.   PAIN:  Are you having pain? No at rest, pt reporting pain of 7-8/10 pain in knees with stair navigation. Pt  reporting dull achy pain on the left knee and sharp pain on the right medial knee with stair navigation. Pt stating pain is worse when ascending.    OBJECTIVE:    PATIENT SURVEYS:  FOTO 05/01/2021: 63%, (Predicted 74%)     05/18/21 71%      EDEMA: 05/01/2021  Rt : 42 centimeters, Left 45 centimeters         05/15/2021: Rt:  41.5 centimeters, Left 43  centimeters     LE ROM:   Active ROM Right 05/01/2021 Left 05/01/2021 Left 05/03/21 Left 05/15/2021  Hip flexion        Hip extension        Hip abduction        Hip adduction        Hip internal rotation        Hip external rotation        Knee flexion 125 105 128 130  Knee extension 0 0  0  Ankle dorsiflexion 10 5    Ankle plantarflexion 20 15    Ankle inversion 28 20    Ankle eversion 34 25     (Blank rows = not tested)   LE MMT:   MMT Right 05/01/2021 Left 05/01/2021  Hip flexion 4-/5 4-/5  Hip extension      Hip abduction 4-/5 4-/5  Hip adduction 4-/5 4-/5  Hip internal rotation      Hip external rotation      Knee flexion 5/5 4-/5  Knee extension 4+/5 4-/5  Ankle dorsiflexion 5/5 3/5  Ankle plantarflexion 5/5 3/5  Ankle inversion 5/5 3/5  Ankle eversion 5/5 3/5   (Blank rows = not tested)   LOWER EXTREMITY SPECIAL TESTS:  Knee special tests: Anterior drawer test: negative   FUNCTIONAL TESTS:  5 times sit to stand: 23 seconds   GAIT: Distance walked: 25 feet Assistive device utilized: None Level of assistance: Complete Independence Comments: antalgic gait, unable to wear tennis shoe on the left, pt amb with bedroom slipper on left and tennis shoe on right, due to swelling on left ankle       TODAY'S TREATMENT: 05/18/21 Therex:      Sitting: Sit to/from stand without UE support x 10 reps     Standing: Hamstring curls 2x10 bil Step ups x 5 onto curb height without difficulty     Supine: Bridges x 10 reps on green physioball x 10 reps     Also discussed benefits of aquatic exercise, pt was going to [facility]  so encouraged she resume classes.    05/15/21 Therex:  Lt LAQ 2x10; 5# with 3 sec hold  Sit to/from stand without UE support 2x10  Wall squats with red physioball behind pt's back 2 x10  NuStep L6 x 8 min  LLE Leg Press 31# 3 x10  Calf raises x 20 reps  Standing hip abdct x 10 reps each, bil UE support  Standing hip ext x 10 reps each, bil UE support  Step ups on 4 inch step x 15 leading with each LE no UE support  Bridges: x 10 holding 3 and cues for PPT prior to elevation    05/08/21 Self Care:  Discussed gout and provided up-to-date handout for lifestyle modifications Therapeutic Activities:  Floor transfers with supervision and mod cues for technique, utilized chair to assist with standing Therex:  Lt LAQ 2x10; 5# with 3 sec hold  Sit to/from stand without UE support 2x10  NuStep L6 x 8 min  LLE Leg Press 31# 2x10  Calf raises x 20 reps  Standing hip abdct x 10 reps each, bil UE support  Standing hip ext x 10 reps each, bil UE support      PATIENT EDUCATION:  Education details: PT POC, HEP Person educated: Patient Education method: Explanation, Demonstration, Tactile cues, Verbal cues, and Handouts Education comprehension: verbalized understanding     HOME EXERCISE PROGRAM: Access Code: [email or web address] URL: [email or web address] Date: 05/18/2021 Prepared by: Faustino Congress  Exercises - Supine Bridge  - 2 x daily - 7 x weekly - 2 sets - 10 reps - 5 seconds hold - Seated Ankle Alphabet  - 2 x daily - 7 x weekly - 1 reps - Sidelying Hip Abduction  - 2 x daily - 7 x weekly - 2 sets - 10 reps - Squat with Chair Touch  - 1 x daily - 7 x weekly - 3 sets - 10 reps - Standing Hamstring Curl with Chair Support  - 1 x daily - 7 x weekly - 3 sets - 10 reps - Bridge with Heels on [employer]  - 1 x daily - 7 x weekly - 3 sets - 10 reps     ASSESSMENT:   CLINICAL IMPRESSION: Pt has met 4/5 LTGs and feels ready to transition to HEP and community fitness.  FOTO  score improved but not quite  to goal.  Will hold PT x 30 days; if she returns will reassess, otherwise will d/c PT.   OBJECTIVE IMPAIRMENTS Abnormal gait, decreased activity tolerance, decreased balance, decreased mobility, difficulty walking, decreased ROM, decreased strength, increased edema, impaired flexibility, and pain.    ACTIVITY LIMITATIONS cleaning, community activity, and occupation.    PERSONAL FACTORS HTN, history of cardiomyopathy, gout are also affecting patient's functional outcome.      REHAB POTENTIAL: Good   CLINICAL DECISION MAKING: Stable/uncomplicated   EVALUATION COMPLEXITY: Low     GOALS: Goals reviewed with patient? Yes     Short term PT Goals (target date for Short term goals are 3 weeks 05/23/2021) Patient will demonstrate independent use of home exercise program to maintain progress from in clinic treatments. Goal status: Met 05/15/2021   Long term PT goals (target dates for all long term goals are 6 weeks  06/15/2021 ) Patient will demonstrate/report pain at worst less than or equal to 2/10 to facilitate minimal limitation in daily activity secondary to pain symptoms. Goal status: MET 05/18/21   Patient will demonstrate independent use of home exercise program to facilitate ability to maintain/progress functional gains from skilled physical therapy services. Goal status: MET 05/18/21   Patient will demonstrate FOTO outcome > or = 74 % to indicate reduced disability due to condition. Goal status: NOT MET 05/18/21   Pt will improve left knee flexion to >/= 120 degrees for improved functional mobility.  Goal status: MET 05/03/21       5.  Pt will be able to amb 1000 feet community surfaces and able to navigate curb step with </= 2/10 pain with step through gait pattern with no device.  Goal status: MET 05/18/21   PLAN: PT FREQUENCY: 2x/week   PT DURATION: 6 weeks   PLANNED INTERVENTIONS:  Therapeutic exercises, Therapeutic activity, Neuro Muscular  re-education, Balance training, Gait training, Patient/Family education, Joint mobilization, Stair training, DME instructions, Dry Needling, Electrical stimulation, Cryotherapy, Moist heat, Taping, Ultrasound, Ionotophoresis 61m/ml Dexamethasone, and Manual therapy.  All included unless contraindicated   PLAN FOR NEXT SESSION: hold PT x 30 days, recert or d/c    SLaureen Abrahams PT, DPT 05/18/21 9:25 AM       PHYSICAL THERAPY DISCHARGE SUMMARY  Visits from Start of Care: 5  Current functional level related to goals / functional outcomes: See above   Remaining deficits: See above   Education / Equipment: HEP   Patient agrees to discharge. Patient goals were partially met. Patient is being discharged due to being pleased with the current functional level.  SLaureen Abrahams PT, DPT 07/18/21 11:30 AM  [facility] Center[facility] Therapy [address]G[place] [address]Phone: [phone]  Fax:  [phone]

## 2021-05-21 ENCOUNTER — Encounter: Payer: 59 | Admitting: Physical Therapy

## 2021-06-05 ENCOUNTER — Other Ambulatory Visit: Payer: Self-pay

## 2021-06-05 DIAGNOSIS — D469 Myelodysplastic syndrome, unspecified: Secondary | ICD-10-CM

## 2021-06-06 ENCOUNTER — Ambulatory Visit (HOSPITAL_COMMUNITY): Payer: 59 | Attending: Internal Medicine

## 2021-06-06 DIAGNOSIS — R002 Palpitations: Secondary | ICD-10-CM | POA: Insufficient documentation

## 2021-06-06 DIAGNOSIS — R55 Syncope and collapse: Secondary | ICD-10-CM | POA: Insufficient documentation

## 2021-06-06 DIAGNOSIS — R011 Cardiac murmur, unspecified: Secondary | ICD-10-CM | POA: Diagnosis present

## 2021-06-06 LAB — ECHOCARDIOGRAM COMPLETE
Area-P 1/2: 3.28 cm2
S' Lateral: 3.8 cm

## 2021-06-07 ENCOUNTER — Inpatient Hospital Stay (HOSPITAL_BASED_OUTPATIENT_CLINIC_OR_DEPARTMENT_OTHER): Payer: 59 | Admitting: Hematology

## 2021-06-07 ENCOUNTER — Other Ambulatory Visit: Payer: Self-pay

## 2021-06-07 ENCOUNTER — Encounter: Payer: Self-pay | Admitting: Orthopaedic Surgery

## 2021-06-07 ENCOUNTER — Inpatient Hospital Stay: Payer: 59 | Attending: Hematology

## 2021-06-07 VITALS — BP 141/83 | HR 82 | Temp 98.0°F | Resp 18 | Ht 66.0 in | Wt 199.5 lb

## 2021-06-07 DIAGNOSIS — M109 Gout, unspecified: Secondary | ICD-10-CM

## 2021-06-07 DIAGNOSIS — D61818 Other pancytopenia: Secondary | ICD-10-CM

## 2021-06-07 DIAGNOSIS — I10 Essential (primary) hypertension: Secondary | ICD-10-CM | POA: Diagnosis not present

## 2021-06-07 DIAGNOSIS — D469 Myelodysplastic syndrome, unspecified: Secondary | ICD-10-CM | POA: Diagnosis present

## 2021-06-07 DIAGNOSIS — D696 Thrombocytopenia, unspecified: Secondary | ICD-10-CM | POA: Diagnosis present

## 2021-06-07 DIAGNOSIS — Z87891 Personal history of nicotine dependence: Secondary | ICD-10-CM | POA: Insufficient documentation

## 2021-06-07 LAB — CMP (CANCER CENTER ONLY)
ALT: 19 U/L (ref 0–44)
AST: 18 U/L (ref 15–41)
Albumin: 4.2 g/dL (ref 3.5–5.0)
Alkaline Phosphatase: 76 U/L (ref 38–126)
Anion gap: 7 (ref 5–15)
BUN: 18 mg/dL (ref 8–23)
CO2: 25 mmol/L (ref 22–32)
Calcium: 9.3 mg/dL (ref 8.9–10.3)
Chloride: 110 mmol/L (ref 98–111)
Creatinine: 0.92 mg/dL (ref 0.44–1.00)
GFR, Estimated: >60 mL/min (ref >60)
Glucose, Bld: 109 mg/dL — ABNORMAL HIGH (ref 70–99)
Potassium: 3.8 mmol/L (ref 3.5–5.1)
Sodium: 142 mmol/L (ref 135–145)
Total Bilirubin: 0.4 mg/dL (ref 0.3–1.2)
Total Protein: 7.3 g/dL (ref 6.5–8.1)

## 2021-06-07 LAB — CBC WITH DIFFERENTIAL (CANCER CENTER ONLY)
Abs Immature Granulocytes: 0.01 10*3/uL (ref 0.00–0.07)
Basophils Absolute: 0 10*3/uL (ref 0.0–0.1)
Basophils Relative: 0 %
Eosinophils Absolute: 0 10*3/uL (ref 0.0–0.5)
Eosinophils Relative: 0 %
HCT: 32.1 % — ABNORMAL LOW (ref 36.0–46.0)
Hemoglobin: 10.2 g/dL — ABNORMAL LOW (ref 12.0–15.0)
Immature Granulocytes: 0 %
Lymphocytes Relative: 53 %
Lymphs Abs: 1.4 10*3/uL (ref 0.7–4.0)
MCH: 30.1 pg (ref 26.0–34.0)
MCHC: 31.8 g/dL (ref 30.0–36.0)
MCV: 94.7 fL (ref 80.0–100.0)
Monocytes Absolute: 0.4 10*3/uL (ref 0.1–1.0)
Monocytes Relative: 15 %
Neutro Abs: 0.9 10*3/uL — ABNORMAL LOW (ref 1.7–7.7)
Neutrophils Relative %: 32 %
Platelet Count: UNDETERMINED 10*3/uL (ref 150–400)
RBC: 3.39 MIL/uL — ABNORMAL LOW (ref 3.87–5.11)
RDW: 16.8 % — ABNORMAL HIGH (ref 11.5–15.5)
Smear Review: UNDETERMINED
WBC Count: 2.7 10*3/uL — ABNORMAL LOW (ref 4.0–10.5)
nRBC: 1.1 % — ABNORMAL HIGH (ref 0.0–0.2)

## 2021-06-07 LAB — LACTATE DEHYDROGENASE: LDH: 199 U/L — ABNORMAL HIGH (ref 98–192)

## 2021-06-07 LAB — FERRITIN: Ferritin: 38 ng/mL (ref 11–307)

## 2021-06-07 LAB — VITAMIN B12: Vitamin B-12: 4128 pg/mL — ABNORMAL HIGH (ref 180–914)

## 2021-06-07 MED ORDER — ALLOPURINOL 100 MG PO TABS: 100.0000 mg | ORAL_TABLET | Freq: Every day | ORAL | 1 refills | Status: DC

## 2021-06-07 MED ORDER — PREDNISONE 5 MG PO TABS: ORAL_TABLET | ORAL | 0 refills | Status: AC

## 2021-06-07 NOTE — Progress Notes (Signed)
? ? ?HEMATOLOGY/ONCOLOGY CLINIC NOTE ? ?Date of Service: 06/07/2021 ? ?Patient Care Team: ?Carol Ada, MD as PCP - General (Family Medicine) ?Josue Hector, MD as PCP - Cardiology (Cardiology) ? ?CHIEF COMPLAINTS/PURPOSE OF CONSULTATION:  ?Follow-up for continued evaluation and management of MDS and thrombocytopenia. ? ?HISTORY OF PRESENTING ILLNESS:  ?Please see previous note for details on initial presentation ? ?Interval History:  ?Karen Waller is a 66 y.o. female ?here for continued evaluation and management of MDS and thrombocytopenia evolving slowly from at least 2019. She reports She is doing well. ? ?She reports a gout flare up in left toe that is causing significant pain. She notes no particular triggers for her gout. She notes she has not eaten red meat recently. We discussed taking a steroid like Prednisone taper for acute treatment and low dose Allopurinol to reduce likelihood of future attacks. ? ?She reports some recent headaches. We discussed this may be a tension headache so we further discussed maintaining hydration with at least 64oz of water per day. ? ?Due to issues with estimating platelet count from clumping, we discussed doing a manuel count which pt was agreeable to. ? ?Labs done today 06/07/2021 reviewed wit her in detail. ?CBC showed hemoglobin of 10.2, WBC count of 2.7k with ANC 0.9K/uL and platelets clumped causing issues with estimating count. ?CMP unremarkable. ?LDH elevated at 199. ? ?No other new or acute focal symptoms. ? ?We discussed Bone marrow biopsy done 03/22/2021 which revealed "03/22/2021 Biopsy  ?BONE MARROW: ?Hypercellular bone marrow (90%) with myeloid hyperplasia with mild dyspoiesis, dysmegakaryopoiesis, moderate fibrosis, and no increased blasts (see comment) ? ?Erythropoiesis is present but no increased. Blasts are not increasd, confirmed by a CD34 immunohistochemical stain. Megakaryocytes are present with hyperchromatic, hypolobated forms. A reticulin special  stain reveals increased reticulin fibrosis (MF grade 2 of 3). Lymphoid infiltrates are present which are show admixed CD20 and CD3 positive cells. Bone trabeculae are unremarkable." ? ?MEDICAL HISTORY:  ?Past Medical History:  ?Diagnosis Date  ? Cardiomyopathy   ? EF 30-35% diagnosed 02/2008  ? Central obesity   ? Gout   ? left knee  ? HTN (hypertension)   ? Pancytopenia ([place])   ? ? ?SURGICAL HISTORY: ?Past Surgical History:  ?Procedure Laterality Date  ? KNEE SURGERY    ? TONSILLECTOMY    ? ? ?SOCIAL HISTORY: ?Social History  ? ?Socioeconomic History  ? Marital status: Married  ?  Spouse name: Not on file  ? Number of children: 2  ? Years of education: Not on file  ? Highest education level: Not on file  ?Occupational History  ? Occupation: [employer]  ?Tobacco Use  ? Smoking status: Former  ?  Types: Cigarettes  ?  Quit date: 02/19/1984  ?  Years since quitting: 37.3  ? Smokeless tobacco: Never  ?Vaping Use  ? Vaping Use: Never used  ?Substance and Sexual Activity  ? Alcohol use: Not Currently  ? Drug use: Never  ? Sexual activity: Not on file  ?Other Topics Concern  ? Not on file  ?Social History Narrative  ? Not on file  ? ?Social Determinants of Health  ? ?Financial Resource Strain: Not on file  ?Food Insecurity: Not on file  ?Transportation Needs: Not on file  ?Physical Activity: Not on file  ?Stress: Not on file  ?Social Connections: Not on file  ?Intimate Partner Violence: Not on file  ? ? ?FAMILY HISTORY: ?Family History  ?Problem Relation Age of Onset  ? Hypertension Mother   ?  Breast cancer Neg Hx   ? ? ?ALLERGIES:  is allergic to sulfa antibiotics and codeine. ? ?MEDICATIONS:  ?Current Outpatient Medications  ?Medication Sig Dispense Refill  ? Acetaminophen (TYLENOL ARTHRITIS PAIN PO) Take by mouth as needed.    ? acetaminophen (TYLENOL) 500 MG tablet Take by mouth.    ? amLODipine (NORVASC) 10 MG tablet TAKE 1 TABLET(10 MG) BY MOUTH DAILY 90 tablet 3  ? B Complex Vitamins (B COMPLEX 100 PO)     ? candesartan  (ATACAND) 16 MG tablet TAKE 1 TABLET BY MOUTH DAILY 90 tablet 0  ? carvedilol (COREG) 6.25 MG tablet Take 1 tablet (6.25 mg total) by mouth 2 (two) times daily with a meal. 180 tablet 3  ? furosemide (LASIX) 20 MG tablet Take 0.5 tablets (10 mg total) by mouth daily. 45 tablet 3  ? hydrALAZINE (APRESOLINE) 25 MG tablet Take 1 tablet (25 mg total) by mouth 2 (two) times daily. 180 tablet 3  ? Multiple Vitamin (MULTIVITAMIN) capsule Take 1 capsule by mouth daily.    ? Cholecalciferol (VITAMIN D3) 2000 units capsule Take 2,000 Units by mouth daily. (Patient not taking: Reported on 06/07/2021)    ? diclofenac sodium (VOLTAREN) 1 % GEL Apply 4 g topically 4 (four) times daily as needed. (Patient not taking: Reported on 04/24/2021) 500 g 6  ? methocarbamol (ROBAXIN) 500 MG tablet Take 1 tablet (500 mg total) by mouth 2 (two) times daily as needed. (Patient not taking: Reported on 04/24/2021) 20 tablet 0  ? methylPREDNISolone (MEDROL DOSEPAK) 4 MG TBPK tablet Take as directed (Patient not taking: Reported on 04/24/2021) 21 tablet 0  ? oxyCODONE-acetaminophen (PERCOCET) 5-325 MG tablet Take 1 tablet by mouth every 6 (six) hours as needed for moderate pain or severe pain. (Patient not taking: Reported on 04/24/2021) 10 tablet 0  ? predniSONE (STERAPRED UNI-PAK 21 TAB) 5 MG (21) TBPK tablet Take as directed (Patient not taking: Reported on 06/07/2021) 21 tablet 0  ? TIZANIDINE HCL PO Take by mouth. Unknown dosage (Patient not taking: Reported on 06/07/2021)    ? traMADol (ULTRAM) 50 MG tablet Take 1-2 tablets (50-100 mg total) by mouth daily as needed. (Patient not taking: Reported on 06/07/2021) 20 tablet 0  ? ?No current facility-administered medications for this visit.  ? ? ?REVIEW OF SYSTEMS:   ?.10 Point review of Systems was done is negative except as noted above. ? ?PHYSICAL EXAMINATION: ? ?Vitals:  ? 06/07/21 1044  ?BP: (!) 141/83  ?Pulse: 82  ?Resp: 18  ?Temp: 98 ?F (36.7 ?C)  ?SpO2: 98%  ? ? ?Filed Weights  ? 06/07/21 1044   ?Weight: 199 lb 8 oz (90.5 kg)  ? ? ?.Body mass index is 32.2 kg/m?Mar[place] Kitchen  ?NAD ?GENERAL:alert, in no acute distress and comfortable ?SKIN: no acute rashes, no significant lesions ?EYES: conjunctiva are pink and non-injected, sclera anicteric ?NECK: supple, no JVD ?LYMPH:  no palpable lymphadenopathy in the cervical, axillary or inguinal regions ?LUNGS: clear to auscultation b/l with normal respiratory effort ?HEART: regular rate & rhythm ?ABDOMEN:  normoactive bowel sounds , non tender, not distended. ?Extremity: no pedal edema ?PSYCH: alert & oriented x 3 with fluent speech ?NEURO: no focal motor/sensory deficits ? ?Exam performed in chair. ? ?LABORATORY DATA:  ?I have reviewed the data as listed ? ?. ? important suggestion  View Detailed Reports  ? ? important suggestion  View Condensed Results Report  ? ? Contains abnormal data CBC and Differential ?Order: [id number] ? suggestion  Information displayed  in this report may not trend or trigger automated decision support.  ? ? Ref Range & Units 2 wk ago  ?WBC 4.4 - 11.0 x 10*3/uL 3.3 Low    ?RBC 4.10 - 5.10 x 10*6/uL 3.54 Low    ?Hemoglobin 12.3 - 15.3 G/DL 10.8 Low    ?Hematocrit 35.9 - 44.6 % 33.0 Low    ?MCV 80.0 - 96.0 FL 93.0   ?MCH 27.5 - 33.2 PG 30.4   ?MCHC 33.0 - 37.0 G/DL 32.7 Low    ?RDW 12.3 - 17.0 % 19.8 High    ?Platelets 150 - 450 X 10*3/uL 92 Low    ?MPV 6.8 - 10.2 FL 10.6 High    ?Neutrophil % % 28   ?Lymphocyte % % 59   ?Monocyte % % 10   ?Eosinophil % % 3   ?Basophil % % 0   ?Seg Absolute 1.8 - 7.8 x 10*3/uL 0.9 Low    ?Lymphocyte Absolute 1.0 - 4.8 x 10*3/uL 2.0   ?Monocyte Absolute 0.0 - 0.8 x 10*3/uL 0.3   ?Eosinophil Absolute 0.0 - 0.5 x 10*3/uL 0.1   ?Basophil Absolute 0.0 - 0.2 x 10*3/uL 0.0   ?NRBC Manual <=0 / 100 WBC 1 High    ?Ovalocytes  1+   ?Polychromasia  1+   ?Schistocytes  1+   ?Tear Drop  1+   ?Platelet Morphology  1   ?Comment: Platelet Morphology Appear Normal  ?WBC MORPHOLOGY  1   ?Comment: Manual Diff Performed  ?Specimen  Collected: 03/12/21 10:07 Last Resulted: 03/12/21 11:02  ?Received From: [facility]  Result Received: 03/13/21 14:07  ? View Encounter    ? Received Information ? Contains abnormal data Co

## 2021-06-07 NOTE — Telephone Encounter (Signed)
Sure that's fine about the gout thing.

## 2021-06-10 ENCOUNTER — Encounter: Payer: Self-pay | Admitting: Hematology

## 2021-06-11 ENCOUNTER — Telehealth: Payer: Self-pay | Admitting: Hematology

## 2021-06-11 NOTE — Telephone Encounter (Signed)
Per 4/24 in basket called and spoke to pt about appointment.  Pt confirmed appointment  ?

## 2021-07-05 ENCOUNTER — Other Ambulatory Visit: Payer: Self-pay | Admitting: Cardiovascular Disease

## 2021-07-17 NOTE — Progress Notes (Incomplete)
Patient ID: Karen Waller, female   DOB: June 16, 1955, 66 y.o.   MRN: [id number]     Karen Waller is seen today for DCM, HTN palpitations and elevated lipids.. She denies syncope, SSCP, dyspnea or edema.. Her EF had been as low as 35%. She is tolerating BB and ARB. Work at [employer] is [employer] well. She is seeing a dietician for initial Rx of her elevated lipids. Compliant with meds  Diuretics can cause flairs in gout especially HCTZ Atacand worked better than Cozaar.   EF 47% by MRI 01/13/13.  No scar.    TTE reviewed 07/16/18 EF 55-60% normal valves GLS -16.6  Monitor June 2020 no PAF some atrial and ventricular ectopy but less than 1% beats and did not correlate With symptoms  TTE 06/06/21 EF 50-55%   Seen by PA 04/24/21 complained of recurrent palpitations  Monitor 04/24/21 PAC/PVC;s < 1% total beats   Sees Ortho Dr Erlinda Hong for shoulder and left foot / knee pains in distal quad ? Foot pain gout given steroids   She is watching her salt but still overweight and sedentary. 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 BM aspirate 11/26/18 hypercellular No blastic activity som fibrosis Diagnosed with MDS not much change in BM biopsy this year Hb 10.2 WBC 2.7 and PLT 64    ***   ROS: Denies fever, malais, weight loss, blurry vision, decreased visual acuity, cough, sputum, SOB, hemoptysis, pleuritic pain, palpitaitons, heartburn, abdominal pain, melena, lower extremity edema, claudication, or rash.  All other systems reviewed and negative  General: There were no vitals taken for this visit. Affect appropriate Healthy:  appears stated age 89: normal Neck supple with no adenopathy JVP normal no bruits no thyromegaly Lungs clear with no wheezing and good diaphragmatic motion Heart:  S1/S2 no murmur, no rub, gallop or click PMI normal Abdomen: benighn, BS positve, no tenderness, no AAA no bruit.  No HSM or HJR Distal pulses intact with no bruits No edema Neuro  non-focal Skin some bruising left arm and hand  No muscular weakness     Current Outpatient Medications  Medication Sig Dispense Refill   Acetaminophen (TYLENOL ARTHRITIS PAIN PO) Take by mouth as needed.     acetaminophen (TYLENOL) 500 MG tablet Take by mouth.     allopurinol (ZYLOPRIM) 100 MG tablet Take 1 tablet (100 mg total) by mouth daily. 30 tablet 1   amLODipine (NORVASC) 10 MG tablet TAKE 1 TABLET(10 MG) BY MOUTH DAILY 90 tablet 3   B Complex Vitamins (B COMPLEX 100 PO)      candesartan (ATACAND) 16 MG tablet TAKE 1 TABLET BY MOUTH DAILY 90 tablet 3   carvedilol (COREG) 6.25 MG tablet Take 1 tablet (6.25 mg total) by mouth 2 (two) times daily with a meal. 180 tablet 3   Cholecalciferol (VITAMIN D3) 2000 units capsule Take 2,000 Units by mouth daily. (Patient not taking: Reported on 06/07/2021)     diclofenac sodium (VOLTAREN) 1 % GEL Apply 4 g topically 4 (four) times daily as needed. (Patient not taking: Reported on 04/24/2021) 500 g 6   furosemide (LASIX) 20 MG tablet Take 0.5 tablets (10 mg total) by mouth daily. 45 tablet 3   hydrALAZINE (APRESOLINE) 25 MG tablet Take 1 tablet (25 mg total) by mouth 2 (two) times daily. 180 tablet 3   methocarbamol (ROBAXIN) 500 MG tablet Take 1 tablet (500 mg total) by mouth 2 (two) times daily as needed. (Patient not taking: Reported  on 04/24/2021) 20 tablet 0   Multiple Vitamin (MULTIVITAMIN) capsule Take 1 capsule by mouth daily.     oxyCODONE-acetaminophen (PERCOCET) 5-325 MG tablet Take 1 tablet by mouth every 6 (six) hours as needed for moderate pain or severe pain. (Patient not taking: Reported on 04/24/2021) 10 tablet 0   TIZANIDINE HCL PO Take by mouth. Unknown dosage (Patient not taking: Reported on 06/07/2021)     traMADol (ULTRAM) 50 MG tablet Take 1-2 tablets (50-100 mg total) by mouth daily as needed. (Patient not taking: Reported on 06/07/2021) 20 tablet 0   No current facility-administered medications for this visit.     Allergies  Sulfa antibiotics and Codeine  Electrocardiogram:   01/08/17 SR rate 65 normal 03/31/19 SR rate 70 normal   Assessment and Plan  HTN: Well controlled.  Continue current medications and low sodium Dash type diet.    Palpitations:  Continue beta blocker no serious arrhythmia's on monitor June 2020 or 05/21/21   DCM:  Euvolemic continue current meds EF 50-55% by TTE 06/06/21 with trivial MR and normal RV   Bruising:  F/u hematology: Pancytopenia with MDS stable by BM biopsy this year   Gout:  left foot has had steroid tapers started on allopurinol 06/07/21   F/U in a year   [employer]

## 2021-07-22 ENCOUNTER — Other Ambulatory Visit: Payer: Self-pay | Admitting: Cardiovascular Disease

## 2021-07-23 ENCOUNTER — Ambulatory Visit: Payer: 59 | Admitting: Cardiovascular Disease

## 2021-07-23 NOTE — Telephone Encounter (Signed)
Outpatient Medication Detail   Disp Refills Start End   carvedilol (COREG) 6.25 MG tablet 180 tablet 3 04/24/2021    Sig - Route: Take 1 tablet (6.25 mg total) by mouth 2 (two) times daily with a meal. - Oral   Sent to pharmacy as: carvedilol (COREG) 6.25 MG tablet   E-Prescribing Status: Receipt confirmed by pharmacy (04/24/2021  9:47 AM EST)    Pharmacy  [facility], [place]

## 2021-07-30 IMAGING — US US TRANSVAGINAL NON-OB
1 series · 14 of 25 positions shown · non-contrast
Comparison: Prior study from 11/02/2018.

CLINICAL DATA: Initial evaluation for pelvic pain, suprapubic
tenderness.

EXAM:
ULTRASOUND PELVIS TRANSVAGINAL
TECHNIQUE: Transvaginal ultrasound examination of the pelvis was performed
including evaluation of the uterus, ovaries, adnexal regions, and
pelvic cul-de-sac.

[Series 1: us transvaginal non-ob · 0.11mm/px · 14 of 31 slices shown]
[im 1/31]
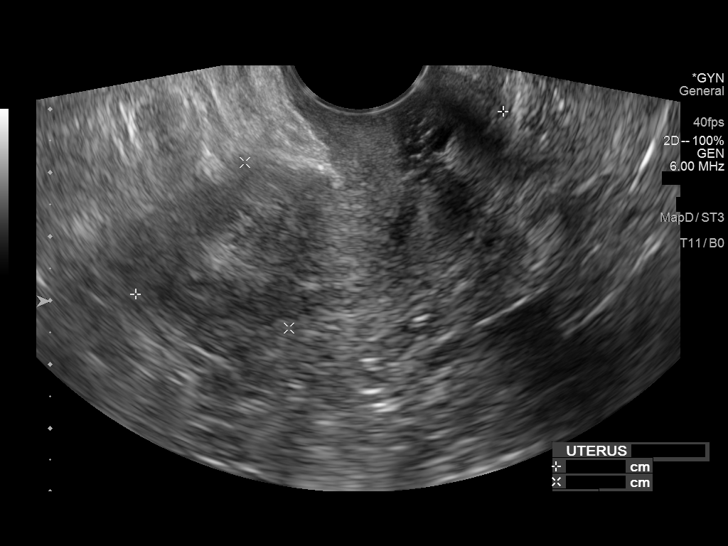
[im 3/31]
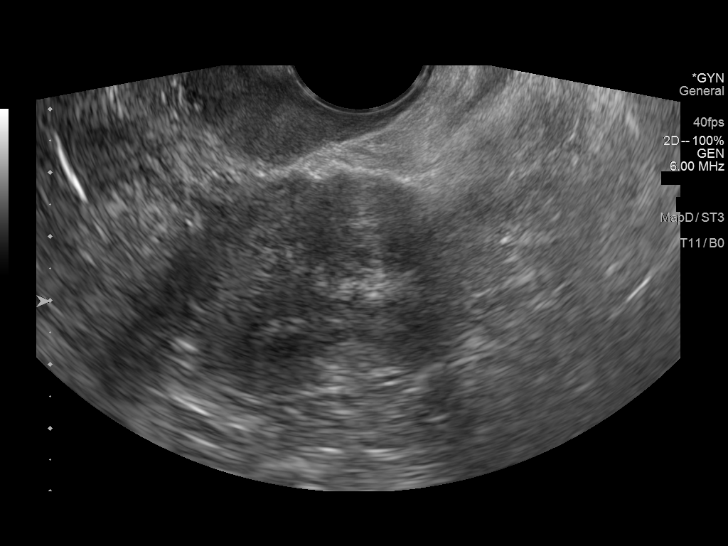
[im 6/31]
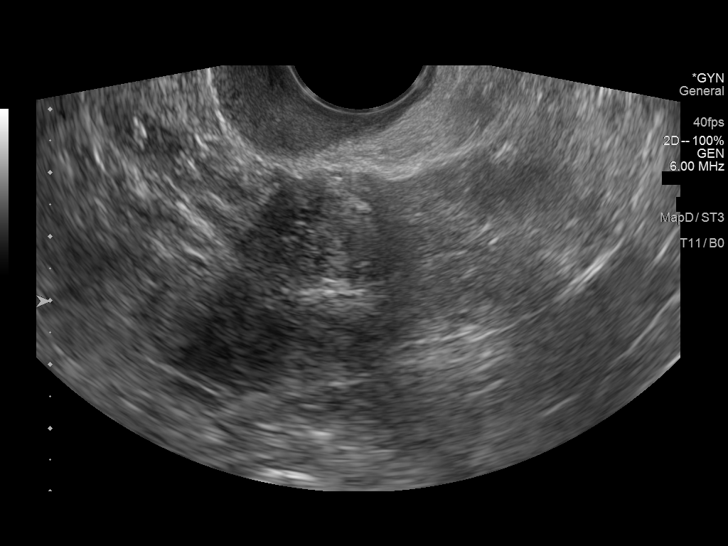
[im 8/31]
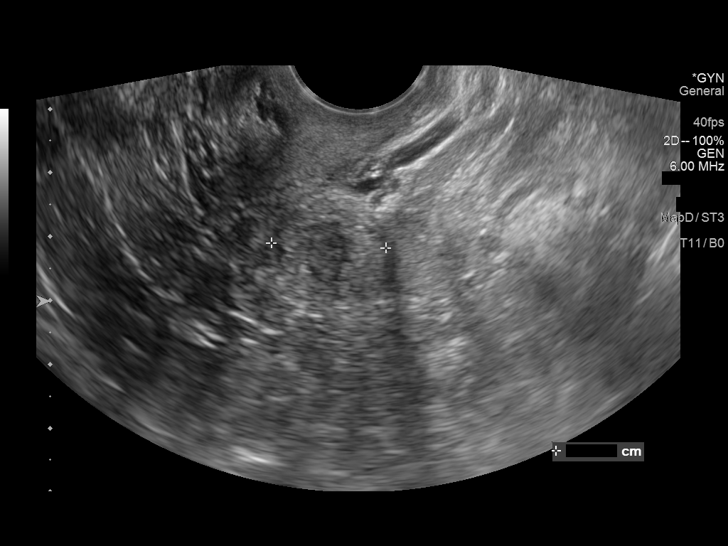
[im 11/31]
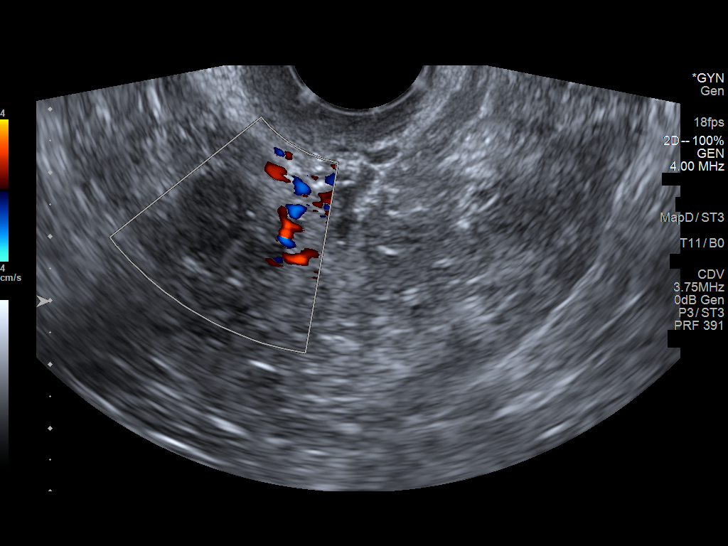
[im 12/31]
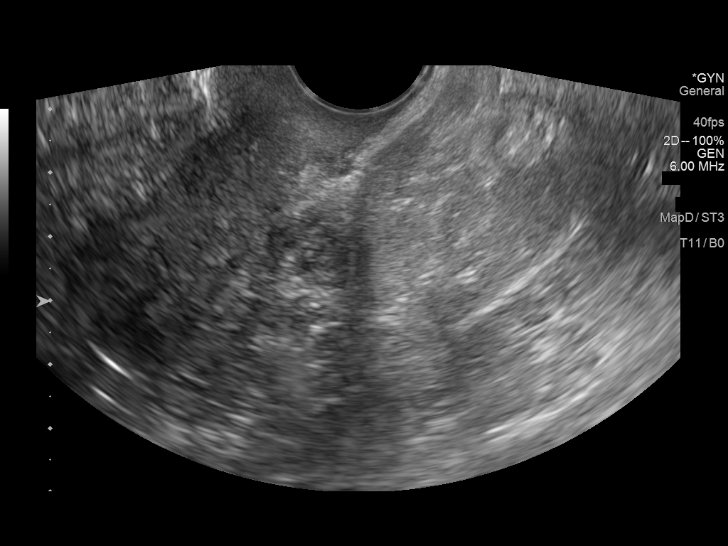
[im 14/31]
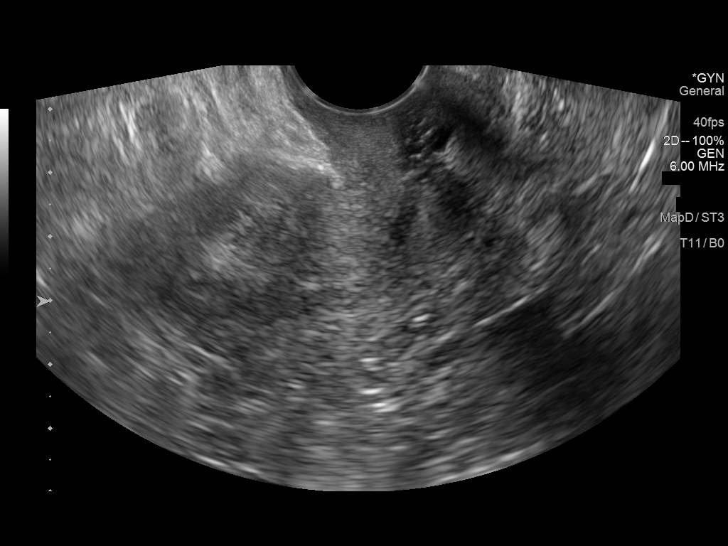
[im 17/31]
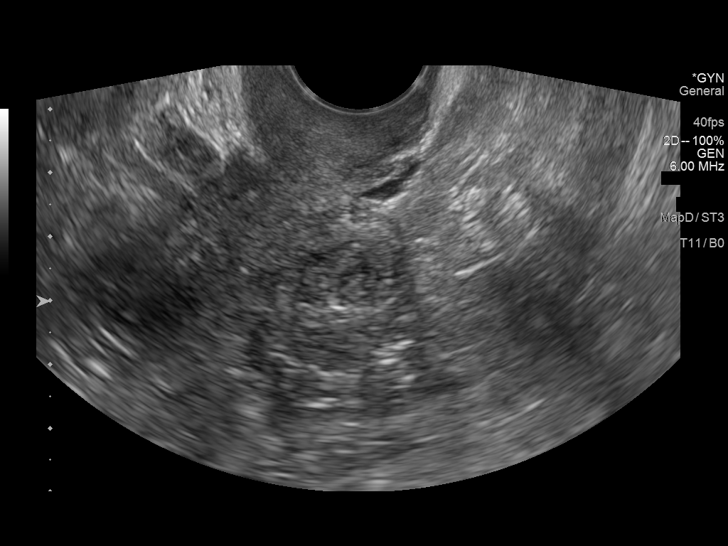
[im 19/31]
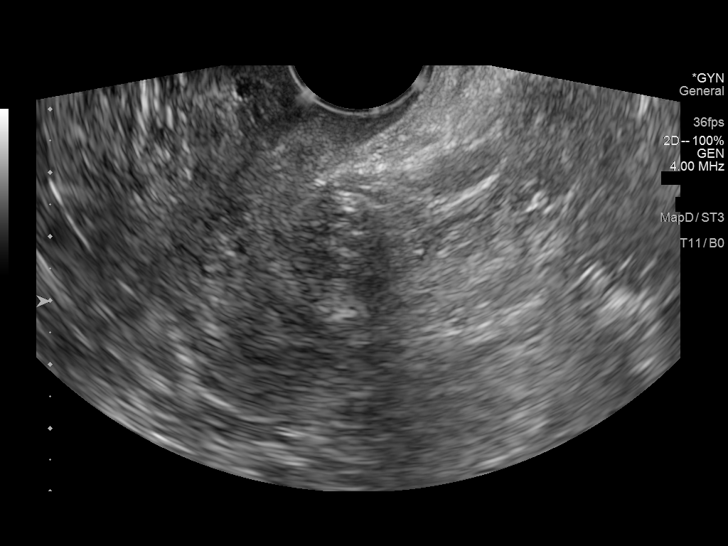
[im 21/31]
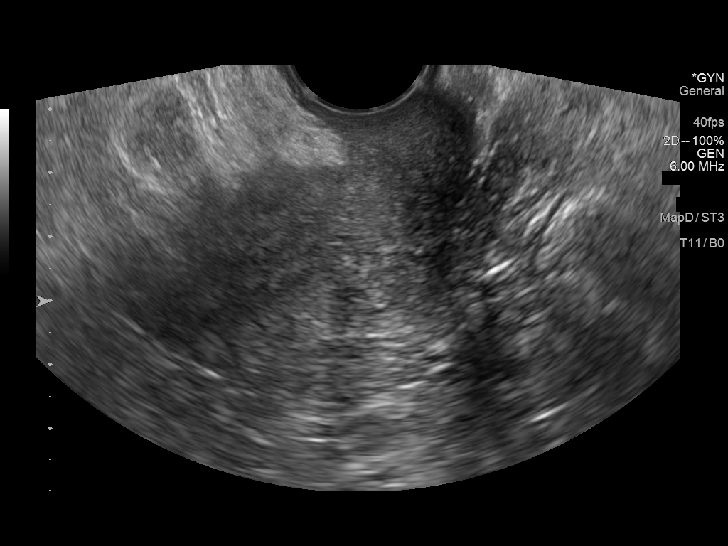
[im 23/31]
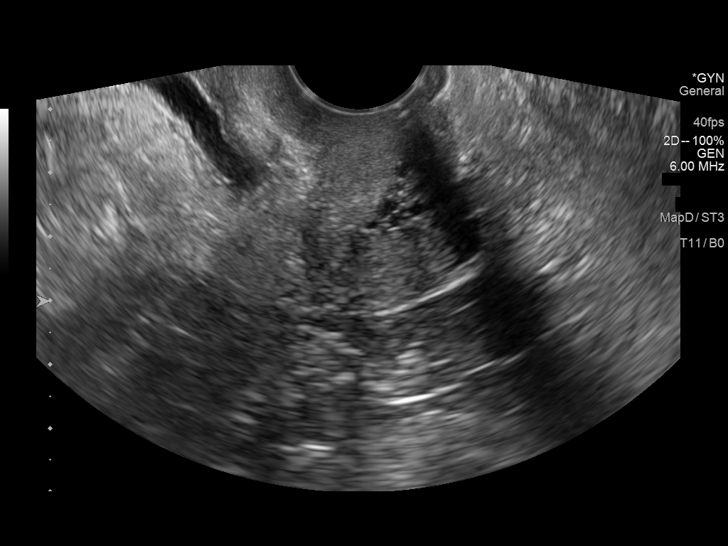
[im 26/31]
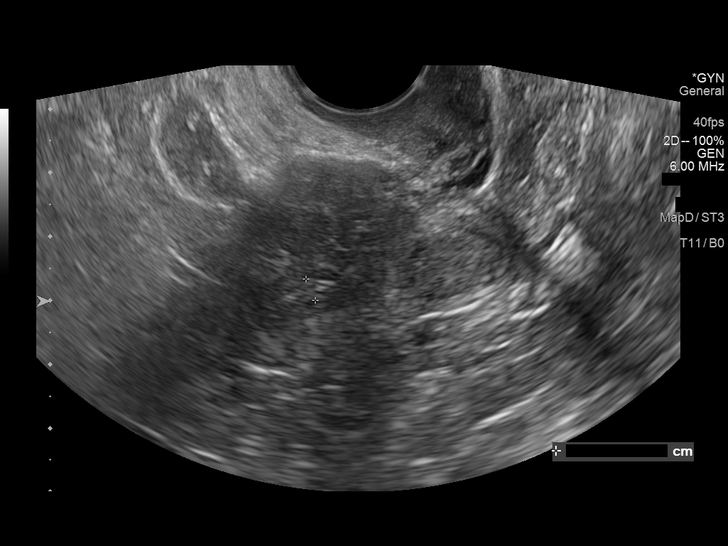
[im 28/31]
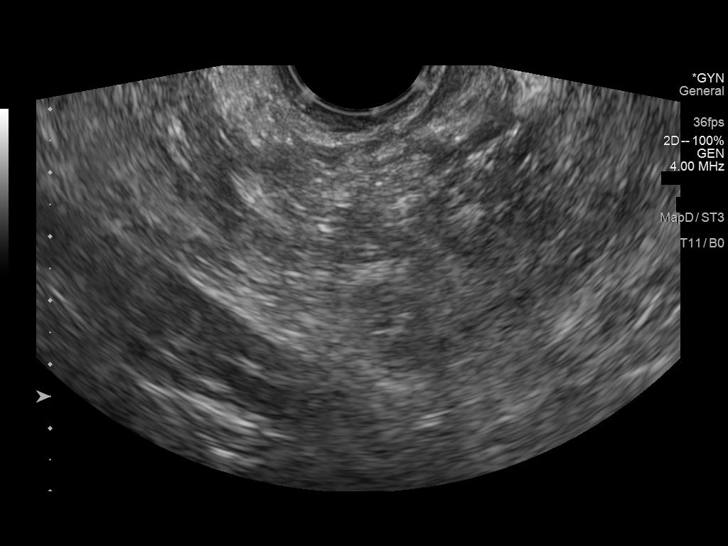
[im 31/31]
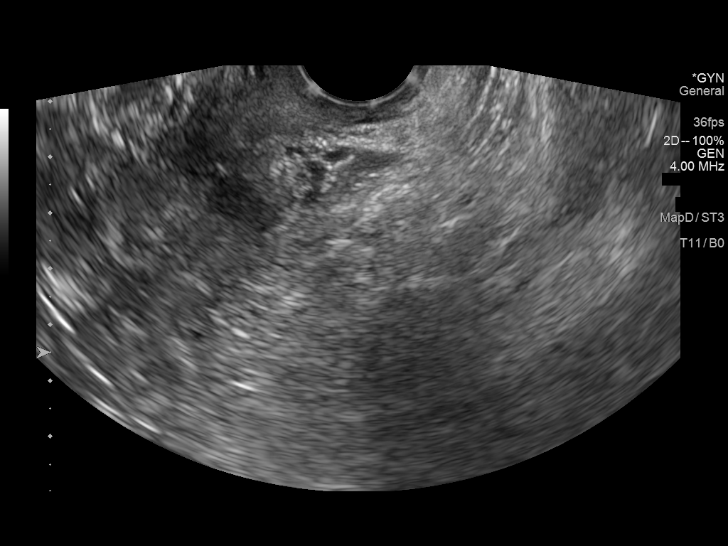

[14 of 25 positions shown; findings below may reference images not displayed]

FINDINGS: Uterus

Measurements: 6.4 x 2.7 x 4.2 cm = volume: 41.8 mL. Uterus is
anteverted. 2.0 x 1.8 x 2.0 cm subserosal to intramural fibroid
present at the anterior left uterine body. Additional 1.2 x 1.4 x
1.8 cm exophytic fibroid extends from the posterior uterine body.

Endometrium

Thickness: 3.6 mm.  No focal abnormality visualized.

Right ovary

Not visualized.  No adnexal mass.

Left ovary

Not visualized.  No adnexal mass.

Other findings:  No abnormal free fluid
IMPRESSION: 1. Fibroid uterus as detailed above.
2. Normal sonographic appearance of the endometrium.
3. Nonvisualization of either ovary. No adnexal mass or free fluid
within the pelvis.

## 2021-08-04 ENCOUNTER — Other Ambulatory Visit: Payer: Self-pay | Admitting: Cardiovascular Disease

## 2021-08-14 ENCOUNTER — Other Ambulatory Visit: Payer: Self-pay | Admitting: Hematology

## 2021-08-15 ENCOUNTER — Encounter: Payer: Self-pay | Admitting: Hematology

## 2021-08-31 ENCOUNTER — Emergency Department (HOSPITAL_COMMUNITY): Payer: 59

## 2021-08-31 ENCOUNTER — Other Ambulatory Visit: Payer: Self-pay

## 2021-08-31 ENCOUNTER — Emergency Department (HOSPITAL_COMMUNITY)
Admission: EM | Admit: 2021-08-31 | Discharge: 2021-09-01 | Disposition: A | Discharge status: Home / Self Care | Payer: 59 | Attending: Emergency Medicine | Admitting: Emergency Medicine

## 2021-08-31 ENCOUNTER — Encounter (HOSPITAL_COMMUNITY): Payer: Self-pay

## 2021-08-31 DIAGNOSIS — Z87891 Personal history of nicotine dependence: Secondary | ICD-10-CM | POA: Diagnosis not present

## 2021-08-31 DIAGNOSIS — M545 Low back pain, unspecified: Secondary | ICD-10-CM | POA: Diagnosis present

## 2021-08-31 DIAGNOSIS — R509 Fever, unspecified: Secondary | ICD-10-CM | POA: Diagnosis not present

## 2021-08-31 DIAGNOSIS — Z79899 Other long term (current) drug therapy: Secondary | ICD-10-CM | POA: Insufficient documentation

## 2021-08-31 DIAGNOSIS — Z20822 Contact with and (suspected) exposure to covid-19: Secondary | ICD-10-CM | POA: Insufficient documentation

## 2021-08-31 DIAGNOSIS — I10 Essential (primary) hypertension: Secondary | ICD-10-CM | POA: Insufficient documentation

## 2021-08-31 NOTE — Progress Notes (Signed)
Patient ID: Karen Waller, female   DOB: 02/01/1956, 66 y.o.   MRN: [id number]     Zanaiya is seen today for DCM, HTN palpitations and elevated lipids.. She denies syncope, SSCP, dyspnea or edema.. Her EF had been as low as 35%. She is tolerating BB and ARB  Work at [employer] is [employer] well. Compliant with meds  Diuretics can cause flairs in gout especially HCTZ Atacand worked better than Cozaar.   EF 47% by MRI 01/13/13.  No scar.    TTE reviewed 07/16/18 EF 55-60% normal valves GLS -16.6  Monitor June 2020 no PAF some atrial and ventricular ectopy but less than 1% beats and did not correlate With symptoms   Seen by PA March 2023 complained of atypical chest pain and palpitations TTE and monitor benign   TTE 06/06/21 EF 50-55% Monitor 05/21/21 SR PVC/PAC;s < 1% total beats   Has a large [place] Shepherd named Titan Activity limited by left quad tendinitis and gout in foot Has seen Dr Erlinda Hong and been Rx with pred pack   Sees hematology regularly for pancytopenia. BM aspirate 11/26/18 hypercellular No blastic activity som fibrosis ? MDS   Having some SSCP seems to be related to taking prednisone Resting/stress SSCP can last hours Not always exertional    ROS: Denies fever, malais, weight loss, blurry vision, decreased visual acuity, cough, sputum, SOB, hemoptysis, pleuritic pain, palpitaitons, heartburn, abdominal pain, melena, lower extremity edema, claudication, or rash.  All other systems reviewed and negative  General: There were no vitals taken for this visit. Affect appropriate Healthy:  appears stated age 66: normal Neck supple with no adenopathy JVP normal no bruits no thyromegaly Lungs clear with no wheezing and good diaphragmatic motion Heart:  S1/S2 no murmur, no rub, gallop or click PMI normal Abdomen: benighn, BS positve, no tenderness, no AAA no bruit.  No HSM or HJR Distal pulses intact with no bruits No edema Neuro non-focal Skin some bruising left arm and hand  No  muscular weakness     Current Outpatient Medications  Medication Sig Dispense Refill   Acetaminophen (TYLENOL ARTHRITIS PAIN PO) Take by mouth as needed.     acetaminophen (TYLENOL) 500 MG tablet Take by mouth.     allopurinol (ZYLOPRIM) 100 MG tablet TAKE 1 TABLET(100 MG) BY MOUTH DAILY 30 tablet 1   amLODipine (NORVASC) 10 MG tablet TAKE 1 TABLET(10 MG) BY MOUTH DAILY 90 tablet 3   B Complex Vitamins (B COMPLEX 100 PO)      candesartan (ATACAND) 16 MG tablet TAKE 1 TABLET BY MOUTH DAILY 90 tablet 3   carvedilol (COREG) 6.25 MG tablet Take 1 tablet (6.25 mg total) by mouth 2 (two) times daily with a meal. 180 tablet 3   Cholecalciferol (VITAMIN D3) 2000 units capsule Take 2,000 Units by mouth daily. (Patient not taking: Reported on 06/07/2021)     diclofenac sodium (VOLTAREN) 1 % GEL Apply 4 g topically 4 (four) times daily as needed. (Patient not taking: Reported on 04/24/2021) 500 g 6   furosemide (LASIX) 20 MG tablet Take 0.5 tablets (10 mg total) by mouth daily. 45 tablet 3   hydrALAZINE (APRESOLINE) 25 MG tablet Take 1 tablet (25 mg total) by mouth 2 (two) times daily. 180 tablet 3   methocarbamol (ROBAXIN) 500 MG tablet Take 1 tablet (500 mg total) by mouth 2 (two) times daily as needed. (Patient not taking: Reported on 04/24/2021) 20 tablet 0   Multiple Vitamin (MULTIVITAMIN) capsule Take 1 capsule  by mouth daily.     oxyCODONE-acetaminophen (PERCOCET) 5-325 MG tablet Take 1 tablet by mouth every 6 (six) hours as needed for moderate pain or severe pain. (Patient not taking: Reported on 04/24/2021) 10 tablet 0   TIZANIDINE HCL PO Take by mouth. Unknown dosage (Patient not taking: Reported on 06/07/2021)     traMADol (ULTRAM) 50 MG tablet Take 1-2 tablets (50-100 mg total) by mouth daily as needed. (Patient not taking: Reported on 06/07/2021) 20 tablet 0   No current facility-administered medications for this visit.    Allergies  Sulfa antibiotics and Codeine  Electrocardiogram:    01/08/17 SR rate 65 normal 03/31/19 SR rate 70 normal   Assessment and Plan  HTN: Well controlled.  Continue current medications and low sodium Dash type diet.    Palpitations:  Continue beta blocker no serious arrhythmia's on monitor June 2020   DCM:  Euvolemic continue current meds EF low normal 50-55% by TTE 06/06/21  no need for Entresto  Bruising:  F/u hematology: MDS PLT 66 Hct 32.2 WBC 2.2   Chest Pain: atypical given potential need for BM transplant and previoius DCM will order PET/CT to r/o CAD   SEM:  benign see TTE report above   F/U in a year   [employer]

## 2021-08-31 NOTE — ED Triage Notes (Signed)
Pt reports with fever and back pain since today. Pt reports taking Tylenol this morning.

## 2021-08-31 NOTE — ED Provider Triage Note (Signed)
Emergency Medicine Provider Triage Evaluation Note  Karen Waller , a 66 y.o. female  was evaluated in triage.  Pt complains of back pain since this morning. She reports that she has a history of myelodysplastic syndrome.  She developed a fever this evening, temperature 100.4 in triage.  She is tachycardic with a rate of 106.  She denies feeling febrile, she denies chest pain, shortness of breath, abdominal pain.  She has not had any recent sick contacts.  She notes that she has white blood cell deficit as well as platelet deficiency. Not taking any ppx abx at this time.  Review of Systems  Positive: Fever, back pain Negative: Nausea, vomiting, chills, chest pain, abdominal pain, shob  Physical Exam  BP (!) 169/96 (BP Location: Right Arm)   Pulse (!) 106   Temp (!) 100.4 F (38 C) (Oral)   Resp 18   SpO2 97%  Gen:   Awake, no distress   Resp:  Normal effort  MSK:   Moves extremities without difficulty  Other:  Tachycardic rate, normal rhythm  Medical Decision Making  Medically screening exam initiated at 11:20 PM.  Appropriate orders placed.  Karen Waller was informed that the remainder of the evaluation will be completed by another provider, this initial triage assessment does not replace that evaluation, and the importance of remaining in the ED until their evaluation is complete.  Workup initiated   Anselmo Pickler, [place] 08/31/21 2322

## 2021-09-01 DIAGNOSIS — M545 Low back pain, unspecified: Secondary | ICD-10-CM | POA: Diagnosis not present

## 2021-09-01 LAB — COMPREHENSIVE METABOLIC PANEL
ALT: 16 U/L (ref 0–44)
AST: 18 U/L (ref 15–41)
Albumin: 4.3 g/dL (ref 3.5–5.0)
Alkaline Phosphatase: 74 U/L (ref 38–126)
Anion gap: 8 (ref 5–15)
BUN: 20 mg/dL (ref 8–23)
CO2: 22 mmol/L (ref 22–32)
Calcium: 9.2 mg/dL (ref 8.9–10.3)
Chloride: 111 mmol/L (ref 98–111)
Creatinine, Ser: 0.91 mg/dL (ref 0.44–1.00)
GFR, Estimated: >60 mL/min (ref >60)
Glucose, Bld: 133 mg/dL — ABNORMAL HIGH (ref 70–99)
Potassium: 3.8 mmol/L (ref 3.5–5.1)
Sodium: 141 mmol/L (ref 135–145)
Total Bilirubin: 0.6 mg/dL (ref 0.3–1.2)
Total Protein: 7.7 g/dL (ref 6.5–8.1)

## 2021-09-01 LAB — CBC
HCT: 33.6 % — ABNORMAL LOW (ref 36.0–46.0)
Hemoglobin: 10.6 g/dL — ABNORMAL LOW (ref 12.0–15.0)
MCH: 30.5 pg (ref 26.0–34.0)
MCHC: 31.5 g/dL (ref 30.0–36.0)
MCV: 96.8 fL (ref 80.0–100.0)
Platelets: 66 10*3/uL — ABNORMAL LOW (ref 150–400)
RBC: 3.47 MIL/uL — ABNORMAL LOW (ref 3.87–5.11)
RDW: 17.8 % — ABNORMAL HIGH (ref 11.5–15.5)
WBC: 3.1 10*3/uL — ABNORMAL LOW (ref 4.0–10.5)
nRBC: 0.6 % — ABNORMAL HIGH (ref 0.0–0.2)

## 2021-09-01 LAB — URINALYSIS, ROUTINE W REFLEX MICROSCOPIC
Bacteria, UA: NONE SEEN
Bilirubin Urine: NEGATIVE
Glucose, UA: NEGATIVE mg/dL
Ketones, ur: NEGATIVE mg/dL
Leukocytes,Ua: NEGATIVE
Nitrite: NEGATIVE
Protein, ur: NEGATIVE mg/dL
Specific Gravity, Urine: 1.012 (ref 1.005–1.030)
pH: 5 (ref 5.0–8.0)

## 2021-09-01 LAB — SARS CORONAVIRUS 2 BY RT PCR: SARS Coronavirus 2 by RT PCR: NEGATIVE

## 2021-09-01 LAB — LACTIC ACID, PLASMA: Lactic Acid, Venous: 1.8 mmol/L (ref 0.5–1.9)

## 2021-09-01 MED ORDER — FENTANYL CITRATE PF 50 MCG/ML IJ SOSY
50.0000 ug | PREFILLED_SYRINGE | Freq: Once | INTRAMUSCULAR | Status: AC | PRN
  Administered 2021-09-01: 50 ug via INTRAVENOUS
  Filled 2021-09-01: qty 1

## 2021-09-01 MED ORDER — TIZANIDINE HCL 4 MG PO TABS: 4.0000 mg | ORAL_TABLET | Freq: Four times a day (QID) | ORAL | 0 refills | Status: AC | PRN

## 2021-09-01 MED ORDER — ONDANSETRON HCL 4 MG/2ML IJ SOLN
4.0000 mg | Freq: Once | INTRAMUSCULAR | Status: AC
  Administered 2021-09-01: 4 mg via INTRAVENOUS
  Filled 2021-09-01: qty 2

## 2021-09-01 MED ORDER — FENTANYL CITRATE PF 50 MCG/ML IJ SOSY
50.0000 ug | PREFILLED_SYRINGE | Freq: Once | INTRAMUSCULAR | Status: AC
  Administered 2021-09-01: 50 ug via INTRAVENOUS
  Filled 2021-09-01: qty 1

## 2021-09-01 NOTE — ED Provider Notes (Signed)
[facility] DEPT Provider Note: Georgena Spurling, MD, FACEP  CSN: [id number] MRN: [id number] ARRIVAL: 08/31/21 at 2307 ROOM: WA01/WA01   CHIEF COMPLAINT  Fever   HISTORY OF PRESENT ILLNESS  09/01/21 12:21 AM Karen Waller is a 66 y.o. female with myelodysplastic syndrome.  She developed the sudden onset of left lower lumbar pain while walking with her son yesterday morning.  The pain has both dull and sharp components that she rates it as an 8 out of 10.  It is worse with certain positions and with movement of the left hip.  She has had similar pain in the past.  There was no specific trauma that initiated this.  She did take Tylenol yesterday morning and took Flexeril without relief.    Yesterday evening about 9 PM she developed a fever as high as 100.7.  On arrival here her temperature was noted to be 100.4.  She denies cough, shortness of breath, cold symptoms, nausea, vomiting, diarrhea or dysuria.  Past Medical History:  Diagnosis Date   Cardiomyopathy    EF 30-35% diagnosed 02/2008   Central obesity    Gout    left knee   HTN (hypertension)    Pancytopenia (HCC)     Past Surgical History:  Procedure Laterality Date   KNEE SURGERY     TONSILLECTOMY      Family History  Problem Relation Age of Onset   Hypertension Mother    Breast cancer Neg Hx     Social History   Tobacco Use   Smoking status: Former    Types: Cigarettes    Quit date: 02/19/1984    Years since quitting: 37.5   Smokeless tobacco: Never  Vaping Use   Vaping Use: Never used  Substance Use Topics   Alcohol use: Not Currently   Drug use: Never    Prior to Admission medications   Medication Sig Start Date End Date Taking? Authorizing Provider  acetaminophen (TYLENOL) 500 MG tablet Take 500 mg by mouth every 6 (six) hours as needed for mild pain.   Yes [provider]  allopurinol (ZYLOPRIM) 100 MG tablet TAKE 1 TABLET(100 MG) BY MOUTH DAILY Patient taking differently: Take 100 mg  by mouth daily. 08/14/21  Yes Brunetta Genera, MD  amLODipine (NORVASC) 10 MG tablet TAKE 1 TABLET(10 MG) BY MOUTH DAILY Patient taking differently: Take 10 mg by mouth daily. 04/24/21  Yes Conte, Tessa N, PA-C  B Complex Vitamins (B COMPLEX 100 PO) Take 1 tablet by mouth daily.   Yes [provider]  candesartan (ATACAND) 16 MG tablet TAKE 1 TABLET BY MOUTH DAILY Patient taking differently: Take 16 mg by mouth daily. 07/05/21  Yes Josue Hector, MD  carvedilol (COREG) 6.25 MG tablet Take 1 tablet (6.25 mg total) by mouth 2 (two) times daily with a meal. 04/24/21  Yes Harriet Pho, Tessa N, PA-C  furosemide (LASIX) 20 MG tablet Take 0.5 tablets (10 mg total) by mouth daily. 04/24/21  Yes Conte, Tessa N, PA-C  hydrALAZINE (APRESOLINE) 25 MG tablet Take 1 tablet (25 mg total) by mouth 2 (two) times daily. 04/24/21  Yes Conte, Tessa N, PA-C  Multiple Vitamin (MULTIVITAMIN) capsule Take 1 capsule by mouth daily.   Yes [provider]  tiZANidine (ZANAFLEX) 4 MG tablet Take 1 tablet (4 mg total) by mouth every 6 (six) hours as needed for muscle spasms. 09/01/21  Yes Anne Sebring, MD  traMADol (ULTRAM) 50 MG tablet Take 1-2 tablets (50-100 mg total) by  mouth daily as needed. Patient not taking: Reported on 06/07/2021 03/23/21   Leandrew Koyanagi, MD    Allergies Sulfa antibiotics and Codeine   REVIEW OF SYSTEMS  Negative except as noted here or in the History of Present Illness.   PHYSICAL EXAMINATION  Initial Vital Signs Blood pressure (!) 147/81, pulse 88, temperature (!) 100.4 F (38 C), temperature source Oral, resp. rate (!) 26, SpO2 98 %.  Examination General: Well-developed, well-nourished female in no acute distress; appearance consistent with age of record HENT: normocephalic; atraumatic Eyes: Normal appearing Neck: supple Heart: regular rate and rhythm Lungs: clear to auscultation bilaterally Abdomen: soft; nondistended; nontender; bowel sounds present Back: Left paralumbar  tenderness; pain on movement of left hip Extremities: No deformity; full range of motion; pulses normal Neurologic: Awake, alert and oriented; motor function intact in all extremities and symmetric; no facial droop Skin: Warm and dry Psychiatric: Normal mood and affect   RESULTS  Summary of this visit's results, reviewed and interpreted by myself:   EKG Interpretation  Date/Time:  Friday August 31 2021 23:41:22 EDT Ventricular Rate:  99 PR Interval:  150 QRS Duration: 92 QT Interval:  334 QTC Calculation: 429 R Axis:   81 Text Interpretation: Sinus rhythm Borderline right axis deviation Borderline T wave abnormalities Rate is faster Confirmed by Izic Stfort, Jenny Reichmann [phone]) on 09/01/2021 12:21:37 AM       Laboratory Studies: Results for orders placed or performed during the hospital encounter of 08/31/21 (from the past 24 hour(s))  CBC     Status: Abnormal   Collection Time: 08/31/21 11:51 PM  Result Value Ref Range   WBC 3.1 (L) 4.0 - 10.5 K/uL   RBC 3.47 (L) 3.87 - 5.11 MIL/uL   Hemoglobin 10.6 (L) 12.0 - 15.0 g/dL   HCT 33.6 (L) 36.0 - 46.0 %   MCV 96.8 80.0 - 100.0 fL   MCH 30.5 26.0 - 34.0 pg   MCHC 31.5 30.0 - 36.0 g/dL   RDW 17.8 (H) 11.5 - 15.5 %   Platelets 66 (L) 150 - 400 K/uL   nRBC 0.6 (H) 0.0 - 0.2 %  Comprehensive metabolic panel     Status: Abnormal   Collection Time: 08/31/21 11:51 PM  Result Value Ref Range   Sodium 141 135 - 145 mmol/L   Potassium 3.8 3.5 - 5.1 mmol/L   Chloride 111 98 - 111 mmol/L   CO2 22 22 - 32 mmol/L   Glucose, Bld 133 (H) 70 - 99 mg/dL   BUN 20 8 - 23 mg/dL   Creatinine, Ser 0.91 0.44 - 1.00 mg/dL   Calcium 9.2 8.9 - 10.3 mg/dL   Total Protein 7.7 6.5 - 8.1 g/dL   Albumin 4.3 3.5 - 5.0 g/dL   AST 18 15 - 41 U/L   ALT 16 0 - 44 U/L   Alkaline Phosphatase 74 38 - 126 U/L   Total Bilirubin 0.6 0.3 - 1.2 mg/dL   GFR, Estimated >60 >60 mL/min   Anion gap 8 5 - 15  Lactic acid, plasma     Status: None   Collection Time: 08/31/21  11:51 PM  Result Value Ref Range   Lactic Acid, Venous 1.8 0.5 - 1.9 mmol/L  SARS Coronavirus 2 by RT PCR (hospital order, performed in [facility] hospital lab) *cepheid single result test* Anterior Nasal Swab     Status: None   Collection Time: 09/01/21  1:43 AM   Specimen: Anterior Nasal Swab  Result Value Ref Range  SARS Coronavirus 2 by RT PCR NEGATIVE NEGATIVE  Urinalysis, Routine w reflex microscopic     Status: Abnormal   Collection Time: 09/01/21  2:20 AM  Result Value Ref Range   Color, Urine YELLOW YELLOW   APPearance CLEAR CLEAR   Specific Gravity, Urine 1.012 1.005 - 1.030   pH 5.0 5.0 - 8.0   Glucose, UA NEGATIVE NEGATIVE mg/dL   Hgb urine dipstick SMALL (A) NEGATIVE   Bilirubin Urine NEGATIVE NEGATIVE   Ketones, ur NEGATIVE NEGATIVE mg/dL   Protein, ur NEGATIVE NEGATIVE mg/dL   Nitrite NEGATIVE NEGATIVE   Leukocytes,Ua NEGATIVE NEGATIVE   RBC / HPF 0-5 0 - 5 RBC/hpf   WBC, UA 0-5 0 - 5 WBC/hpf   Bacteria, UA NONE SEEN NONE SEEN   Squamous Epithelial / LPF 0-5 0 - 5   Mucus PRESENT    Imaging Studies: DG Chest 2 View  Result Date: 09/01/2021 CLINICAL DATA:  Cancer patient with fever. Myelodysplastic syndrome by history. EXAM: CHEST - 2 VIEW COMPARISON:  None Available. FINDINGS: There is mild cardiomegaly. No vascular congestion is seen no pleural effusion. The lungs are clear. There are multiple overlying monitor wires. Thoracic cage is intact with slight thoracic dextroscoliosis mild thoracic spondylosis. IMPRESSION: No active cardiopulmonary disease.  Mild cardiomegaly. Electronically Signed   By: Telford Nab M.D.   On: 09/01/2021 00:18    ED COURSE and MDM  Nursing notes, initial and subsequent vitals signs, including pulse oximetry, reviewed and interpreted by myself.  Vitals:   09/01/21 0134 09/01/21 0200 09/01/21 0230 09/01/21 0252  BP:  (!) 146/81 (!) 150/80   Pulse: 83 90 92 91  Resp: (!) 22 (!) 25 20 (!) 21  Temp:    99.1 F (37.3 C)  TempSrc:     Oral  SpO2: 97% 97% 99% 99%   Medications  ondansetron (ZOFRAN) injection 4 mg (4 mg Intravenous Given 09/01/21 0046)  fentaNYL (SUBLIMAZE) injection 50 mcg (50 mcg Intravenous Given 09/01/21 0046)  fentaNYL (SUBLIMAZE) injection 50 mcg (50 mcg Intravenous Given 09/01/21 0235)   2:53 AM Pain improved with IV fentanyl.  As noted above she has had this pain before and I believe it is unrelated to her low-grade fever.  The cause of her fever is unclear but she was advised to return if symptoms worsen.  She is requesting a prescription for tizanidine as that works better than Flexeril.  She already has tramadol at home.   PROCEDURES  Procedures   ED DIAGNOSES     ICD-10-CM   1. Acute left-sided low back pain without sciatica  M54.50     2. Febrile illness  R50.9          Karen Deem, MD 09/01/21 [phone]

## 2021-09-01 NOTE — ED Notes (Signed)
Pt ambulated to the BR with x 1 assist. Pt continues to endorse lower back pain.

## 2021-09-06 ENCOUNTER — Other Ambulatory Visit: Payer: Self-pay

## 2021-09-06 DIAGNOSIS — D469 Myelodysplastic syndrome, unspecified: Secondary | ICD-10-CM

## 2021-09-06 LAB — CULTURE, BLOOD (ROUTINE X 2)
Culture: NO GROWTH
Culture: NO GROWTH
Special Requests: ADEQUATE
Special Requests: ADEQUATE

## 2021-09-10 ENCOUNTER — Inpatient Hospital Stay: Payer: 59

## 2021-09-10 ENCOUNTER — Encounter: Payer: Self-pay | Admitting: Cardiovascular Disease

## 2021-09-10 ENCOUNTER — Inpatient Hospital Stay: Payer: 59 | Attending: Hematology | Admitting: Hematology

## 2021-09-10 ENCOUNTER — Other Ambulatory Visit: Payer: Self-pay

## 2021-09-10 ENCOUNTER — Ambulatory Visit (INDEPENDENT_AMBULATORY_CARE_PROVIDER_SITE_OTHER): Payer: 59 | Admitting: Cardiovascular Disease

## 2021-09-10 VITALS — BP 122/74 | HR 85 | Ht 65.0 in | Wt 197.0 lb

## 2021-09-10 VITALS — BP 141/66 | HR 81 | Temp 97.5°F | Resp 18 | Wt 196.8 lb

## 2021-09-10 DIAGNOSIS — D61818 Other pancytopenia: Secondary | ICD-10-CM | POA: Diagnosis not present

## 2021-09-10 DIAGNOSIS — I10 Essential (primary) hypertension: Secondary | ICD-10-CM | POA: Diagnosis not present

## 2021-09-10 DIAGNOSIS — D469 Myelodysplastic syndrome, unspecified: Secondary | ICD-10-CM | POA: Diagnosis not present

## 2021-09-10 DIAGNOSIS — R011 Cardiac murmur, unspecified: Secondary | ICD-10-CM | POA: Diagnosis not present

## 2021-09-10 DIAGNOSIS — D696 Thrombocytopenia, unspecified: Secondary | ICD-10-CM | POA: Diagnosis not present

## 2021-09-10 DIAGNOSIS — Z87891 Personal history of nicotine dependence: Secondary | ICD-10-CM | POA: Insufficient documentation

## 2021-09-10 DIAGNOSIS — R072 Precordial pain: Secondary | ICD-10-CM

## 2021-09-10 DIAGNOSIS — E782 Mixed hyperlipidemia: Secondary | ICD-10-CM

## 2021-09-10 LAB — CMP (CANCER CENTER ONLY)
ALT: 20 U/L (ref 0–44)
AST: 12 U/L — ABNORMAL LOW (ref 15–41)
Albumin: 4 g/dL (ref 3.5–5.0)
Alkaline Phosphatase: 76 U/L (ref 38–126)
Anion gap: 7 (ref 5–15)
BUN: 18 mg/dL (ref 8–23)
CO2: 30 mmol/L (ref 22–32)
Calcium: 9.2 mg/dL (ref 8.9–10.3)
Chloride: 104 mmol/L (ref 98–111)
Creatinine: 1.03 mg/dL — ABNORMAL HIGH (ref 0.44–1.00)
GFR, Estimated: >60 mL/min (ref >60)
Glucose, Bld: 163 mg/dL — ABNORMAL HIGH (ref 70–99)
Potassium: 3.3 mmol/L — ABNORMAL LOW (ref 3.5–5.1)
Sodium: 141 mmol/L (ref 135–145)
Total Bilirubin: 0.5 mg/dL (ref 0.3–1.2)
Total Protein: 6.7 g/dL (ref 6.5–8.1)

## 2021-09-10 LAB — CBC WITH DIFFERENTIAL (CANCER CENTER ONLY)
Abs Immature Granulocytes: 0.04 10*3/uL (ref 0.00–0.07)
Basophils Absolute: 0 10*3/uL (ref 0.0–0.1)
Basophils Relative: 0 %
Eosinophils Absolute: 0.1 10*3/uL (ref 0.0–0.5)
Eosinophils Relative: 4 %
HCT: 33.1 % — ABNORMAL LOW (ref 36.0–46.0)
Hemoglobin: 10.7 g/dL — ABNORMAL LOW (ref 12.0–15.0)
Immature Granulocytes: 1 %
Lymphocytes Relative: 44 %
Lymphs Abs: 1.6 10*3/uL (ref 0.7–4.0)
MCH: 30.6 pg (ref 26.0–34.0)
MCHC: 32.3 g/dL (ref 30.0–36.0)
MCV: 94.6 fL (ref 80.0–100.0)
Monocytes Absolute: 0.4 10*3/uL (ref 0.1–1.0)
Monocytes Relative: 13 %
Neutro Abs: 1.3 10*3/uL — ABNORMAL LOW (ref 1.7–7.7)
Neutrophils Relative %: 38 %
Platelet Count: 100 10*3/uL — ABNORMAL LOW (ref 150–400)
RBC: 3.5 MIL/uL — ABNORMAL LOW (ref 3.87–5.11)
RDW: 17.8 % — ABNORMAL HIGH (ref 11.5–15.5)
WBC Count: 3.5 10*3/uL — ABNORMAL LOW (ref 4.0–10.5)
nRBC: 0.6 % — ABNORMAL HIGH (ref 0.0–0.2)

## 2021-09-10 LAB — VITAMIN B12: Vitamin B-12: 3307 pg/mL — ABNORMAL HIGH (ref 180–914)

## 2021-09-10 LAB — LACTATE DEHYDROGENASE: LDH: 190 U/L (ref 98–192)

## 2021-09-10 LAB — FERRITIN: Ferritin: 64 ng/mL (ref 11–307)

## 2021-09-10 NOTE — Progress Notes (Signed)
HEMATOLOGY/ONCOLOGY CLINIC NOTE  Date of Service: 09/10/2021  Patient Care Team: Carol Ada, MD as PCP - General (Family Medicine) Josue Hector, MD as PCP - Cardiology (Cardiology)  CHIEF COMPLAINTS/PURPOSE OF CONSULTATION:  Follow-up for continued evaluation and management of MDS with MF and thrombocytopenia.  HISTORY OF PRESENTING ILLNESS:  Please see previous note for details on initial presentation  Interval History:  Karen Waller is a 66 y.o. female here for continued evaluation and management of MDS and thrombocytopenia evolving slowly from at least 2019. She reports She is doing well.  She reports back pain and recent ED visit on 08/31/2021 with a fever of 101.   She notes tickle in the back of her throat. We discussed that while thrush is not visible that she can start an Nystatin mouthwash to help treat it.  No urinary symptoms. No diarrhea. No other new or acute focal symptoms.  MEDICAL HISTORY:  Past Medical History:  Diagnosis Date   Cardiomyopathy    EF 30-35% diagnosed 02/2008   Central obesity    Gout    left knee   HTN (hypertension)    Pancytopenia (HCC)     SURGICAL HISTORY: Past Surgical History:  Procedure Laterality Date   KNEE SURGERY     TONSILLECTOMY      SOCIAL HISTORY: Social History   Socioeconomic History   Marital status: Married    Spouse name: Not on file   Number of children: 2   Years of education: Not on file   Highest education level: Not on file  Occupational History   Occupation: [employer]  Tobacco Use   Smoking status: Former    Types: Cigarettes    Quit date: 02/19/1984    Years since quitting: 37.5   Smokeless tobacco: Never  Vaping Use   Vaping Use: Never used  Substance and Sexual Activity   Alcohol use: Not Currently   Drug use: Never   Sexual activity: Not on file  Other Topics Concern   Not on file  Social History Narrative   Not on file   Social Determinants of Health   Financial  Resource Strain: Not on file  Food Insecurity: Not on file  Transportation Needs: Not on file  Physical Activity: Not on file  Stress: Not on file  Social Connections: Not on file  Intimate Partner Violence: Not on file    FAMILY HISTORY: Family History  Problem Relation Age of Onset   Hypertension Mother    Breast cancer Neg Hx     ALLERGIES:  is allergic to sulfa antibiotics and codeine.  MEDICATIONS:  Current Outpatient Medications  Medication Sig Dispense Refill   acetaminophen (TYLENOL) 500 MG tablet Take 500 mg by mouth every 6 (six) hours as needed for mild pain.     allopurinol (ZYLOPRIM) 100 MG tablet TAKE 1 TABLET(100 MG) BY MOUTH DAILY (Patient taking differently: Take 100 mg by mouth daily.) 30 tablet 1   amLODipine (NORVASC) 10 MG tablet TAKE 1 TABLET(10 MG) BY MOUTH DAILY (Patient taking differently: Take 10 mg by mouth daily.) 90 tablet 3   B Complex Vitamins (B COMPLEX 100 PO) Take 1 tablet by mouth daily.     candesartan (ATACAND) 16 MG tablet TAKE 1 TABLET BY MOUTH DAILY (Patient taking differently: Take 16 mg by mouth daily.) 90 tablet 3   carvedilol (COREG) 6.25 MG tablet Take 1 tablet (6.25 mg total) by mouth 2 (two) times daily with a meal. 180 tablet 3  furosemide (LASIX) 20 MG tablet Take 0.5 tablets (10 mg total) by mouth daily. 45 tablet 3   hydrALAZINE (APRESOLINE) 25 MG tablet Take 1 tablet (25 mg total) by mouth 2 (two) times daily. 180 tablet 3   Multiple Vitamin (MULTIVITAMIN) capsule Take 1 capsule by mouth daily.     tiZANidine (ZANAFLEX) 4 MG tablet Take 1 tablet (4 mg total) by mouth every 6 (six) hours as needed for muscle spasms. 30 tablet 0   traMADol (ULTRAM) 50 MG tablet Take 1-2 tablets (50-100 mg total) by mouth daily as needed. 20 tablet 0   No current facility-administered medications for this visit.    REVIEW OF SYSTEMS:   .10 Point review of Systems was done is negative except as noted above.  PHYSICAL EXAMINATION:  Vitals:    09/10/21 1207  BP: (!) 141/66  Pulse: 81  Resp: 18  Temp: (!) 97.5 F (36.4 C)  SpO2: 99%    Filed Weights   09/10/21 1207  Weight: 196 lb 12.8 oz (89.3 kg)    .Body mass index is 32.75 kg/m.   NAD GENERAL:alert, in no acute distress and comfortable SKIN: no acute rashes, no significant lesions EYES: conjunctiva are pink and non-injected, sclera anicteric OROPHARYNX: MMM, no exudates, no oropharyngeal erythema or ulceration NECK: supple, no JVD LYMPH:  no palpable lymphadenopathy in the cervical, axillary or inguinal regions LUNGS: clear to auscultation b/l with normal respiratory effort HEART: regular rate & rhythm ABDOMEN:  normoactive bowel sounds , non tender, not distended. Extremity: no pedal edema PSYCH: alert & oriented x 3 with fluent speech NEURO: no focal motor/sensory deficits  Exam performed in chair.  LABORATORY DATA:  I have reviewed the data as listed  .  important suggestion  View Detailed Reports    important suggestion  View Condensed Results Report    Contains abnormal data CBC and Differential Order: [id number]  suggestion  Information displayed in this report may not trend or trigger automated decision support.    Ref Range & Units 2 wk ago  WBC 4.4 - 11.0 x 10*3/uL 3.3 Low    RBC 4.10 - 5.10 x 10*6/uL 3.54 Low    Hemoglobin 12.3 - 15.3 G/DL 10.8 Low    Hematocrit 35.9 - 44.6 % 33.0 Low    MCV 80.0 - 96.0 FL 93.0   MCH 27.5 - 33.2 PG 30.4   MCHC 33.0 - 37.0 G/DL 32.7 Low    RDW 12.3 - 17.0 % 19.8 High    Platelets 150 - 450 X 10*3/uL 92 Low    MPV 6.8 - 10.2 FL 10.6 High    Neutrophil % % 28   Lymphocyte % % 59   Monocyte % % 10   Eosinophil % % 3   Basophil % % 0   Seg Absolute 1.8 - 7.8 x 10*3/uL 0.9 Low    Lymphocyte Absolute 1.0 - 4.8 x 10*3/uL 2.0   Monocyte Absolute 0.0 - 0.8 x 10*3/uL 0.3   Eosinophil Absolute 0.0 - 0.5 x 10*3/uL 0.1   Basophil Absolute 0.0 - 0.2 x 10*3/uL 0.0   NRBC Manual <=0 / 100 WBC 1 High     Ovalocytes  1+   Polychromasia  1+   Schistocytes  1+   Tear Drop  1+   Platelet Morphology  1   Comment: Platelet Morphology Appear Normal  WBC MORPHOLOGY  1   Comment: Manual Diff Performed  Specimen Collected: 03/12/21 10:07 Last Resulted: 03/12/21 11:02  Received From:  [facility]  Result Received: 03/13/21 14:07   View Encounter     Received Information  Contains abnormal data Comprehensive Metabolic Panel Order: [id number]  suggestion  Information displayed in this report may not trend or trigger automated decision support.    Ref Range & Units 2 wk ago  Sodium 135 - 146 MMOL/L 140   Potassium 3.5 - 5.3 MMOL/L 3.4 Low    Comment: NO VISIBLE HEMOLYSIS  Chloride 98 - 110 MMOL/L 106   CO2 23 - 30 MMOL/L 26   BUN 8 - 24 MG/DL 19   Glucose 70 - 99 MG/DL 133 High    Comment: Patients taking eltrombopag at doses >/= 100 mg daily may show falsely elevated values of 10% or greater.  Creatinine 0.50 - 1.50 MG/DL 0.93   Calcium 8.5 - 10.5 MG/DL 9.2   Total Protein 6.0 - 8.3 G/DL 6.7   Comment: Patients taking eltrombopag at doses >/= 100 mg daily may show falsely elevated values of 10% or greater.  Albumin  3.5 - 5.0 G/DL 3.8   Total Bilirubin 0.1 - 1.2 MG/DL 0.6   Comment: Patients taking eltrombopag at doses >/= 100 mg daily may show falsely elevated values of 10% or greater.  Alkaline Phosphatase 34 - 104 IU/L or U/L 69   AST (SGOT) 5 - 40 IU/L or U/L 10   ALT (SGPT) 5 - 50 IU/L or U/L 14   Anion Gap 4 - 14 MMOL/L 8   Est. GFR >=60 ML/MIN/1.73 M*2 69   Comment: GFR estimated by CKD-EPI equations(NKF 2021).   "Recommend confirmation of Cr-based eGFR by using Cys-based eGFR and other filtration markers (if applicable) in complex cases and clinical decision-making, as needed."  Specimen Collected: 03/12/21 10:07 Last Resulted: 03/12/21 10:35  Received From: [facility]  Result Received: 03/13/21 14:07   View Encounter     Received  Information Magnesium Order: [id number]  suggestion  Information displayed in this report may not trend or trigger automated decision support.    Ref Range & Units 2 wk ago  Magnesium 1.8 - 2.4 MG/DL 1.9   Comment: Patients taking eltrombopag at doses >/= 100 mg daily may show falsely elevated values of 10% or greater.  Specimen Collected: 03/12/21 10:07 Last Resulted: 03/12/21 10:35  Received From: [facility]  Result Received: 03/13/21 14:07   View Encounter     Received Information    Latest Ref Rng & Units 08/31/2021   11:51 PM 06/07/2021   10:14 AM 05/09/2021    1:19 PM  CBC  WBC 4.0 - 10.5 K/uL 3.1  2.7  3.7   Hemoglobin 12.0 - 15.0 g/dL 10.6  10.2  10.4   Hematocrit 36.0 - 46.0 % 33.6  32.1  32.9   Platelets 150 - 400 K/uL 66  PLATELET CLUMPS NOTED ON SMEAR, UNABLE TO ESTIMATE  PLATELET CLUMPS NOTED ON SMEAR, UNABLE TO ESTIMATE     .    Latest Ref Rng & Units 08/31/2021   11:51 PM 06/07/2021   10:14 AM 05/09/2021    1:19 PM  CMP  Glucose 70 - 99 mg/dL 133  109  89   BUN 8 - 23 mg/dL 20  18  23    Creatinine 0.44 - 1.00 mg/dL 0.91  0.92  1.05   Sodium 135 - 145 mmol/L 141  142  141   Potassium 3.5 - 5.1 mmol/L 3.8  3.8  4.2   Chloride  98 - 111 mmol/L 111  110  107   CO2 22 - 32 mmol/L 22  25  27    Calcium 8.9 - 10.3 mg/dL 9.2  9.3  9.5   Total Protein 6.5 - 8.1 g/dL 7.7  7.3  6.9   Total Bilirubin 0.3 - 1.2 mg/dL 0.6  0.4  0.5   Alkaline Phos 38 - 126 U/L 74  76  70   AST 15 - 41 U/L 18  18  13    ALT 0 - 44 U/L 16  19  20     Component     Latest Ref Rng & Units 06/17/2017  Coagulation Factor VIII     57 - 163 % 128  Ristocetin Co-factor, Plasma     50 - 200 % 117  Von Willebrand Antigen, Plasma     50 - 200 % 126  Prothrombin Time     11.4 - 15.2 seconds 12.9  INR      0.98  PFA Interpretation        Collagen / Epinephrine     0 - 193 seconds 192  Fibrinogen     210 - 475 mg/dL 291  APTT     24 - 36 seconds 31  LDH     125 - 245 U/L  230    11/26/2018 BM Flow Pathology Report    11/26/2018 BM Bx Report    Surgical Pathology  CASE: [id number]  PATIENT: Karen Waller  Bone Marrow Report    Clinical History: pancytopenia, paraproteinemia, right iliac bone      DIAGNOSIS:   BONE MARROW, ASPIRATE, CLOT, CORE:  -Hypercellular bone marrow for age with trilineage hematopoiesis and  fibrosis.  See comment   PERIPHERAL BLOOD:  -Pancytopenia   COMMENT:   The aspirate, touch imprints, and clot sections are suboptimal which  hinders cytomorphologic evaluation.  The core biopsy is optimal and  shows a hypercellular bone marrow (90%) with a mixture of myeloid cell  lines.  This is associated with reticulin fibrosis.  A significant  CD34-positive blast population is not seen.  The myeloid changes are not  considered specific, and hence, it is unclear whether the findings  represent a primary myeloid neoplasm or represent secondary changes.  Correlation with cytogenetic studies is recommended.  There is no  evidence of lymphoproliferative process or diagnostic plasma cell  neoplasm.    11/26/2018 BM Cytogenetics:    RADIOGRAPHIC STUDIES: I have personally reviewed the radiological images as listed and agreed with the findings in the report. DG Chest 2 View  Result Date: 09/01/2021 CLINICAL DATA:  Cancer patient with fever. Myelodysplastic syndrome by history. EXAM: CHEST - 2 VIEW COMPARISON:  None Available. FINDINGS: There is mild cardiomegaly. No vascular congestion is seen no pleural effusion. The lungs are clear. There are multiple overlying monitor wires. Thoracic cage is intact with slight thoracic dextroscoliosis mild thoracic spondylosis. IMPRESSION: No active cardiopulmonary disease.  Mild cardiomegaly. Electronically Signed   By: Telford Nab M.D.   On: 09/01/2021 00:18     ASSESSMENT & PLAN:   66 y.o. female with  1. Minor Spontaneous?/undetected trauma causing bruising No ecchymosis  with bruising  Clotting parameters normal from 06/17/17 with normal PT, aPTT and VWD panel and platelet function testing 01/08/18 PT and aPTT, fibrinogen WNL  2. Thrombocytopenia Due to MDS/MPN  11/16/2018 US Abdomen ([id number]) revealed "Cholelithiasis. No evidence for acute abnormality." 11/26/2018 BM Bx ([id number]) revealed "-Hypercellular bone marrow for age with trilineage hematopoiesis and  fibrosis. Pancytopenia" 11/26/2018 BM Flow Pathology Report [id number]) revealed  "-No significant blastic population identified. 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 MDS/MPN with fibrosis -- causing Pancytopenia --  -Previous bone marrow biopsy was done in October 2020 showing possible MDS/MPN overlap versus MDS with fibrosis.  No increase in blasts cytogenetics with deletion 20 q. NGS with only CBL mutation.  Oct 2020 BM Bx - hypercellular bone marrow with some reticulin fibrosis. 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 CBC showed hemoglobin of 10.7, WBC count of 3.5k with ANC 1.3K/uL and platelets improved to 100k. CMP stable. Potassium a little low at 3.3. LDH is 190. Vitamin B12 elevated at 3307 pg/mL Ferritin at 64 ng/mL -We will continue supportive treatments at this time. -Continue Vitamin B-complex daily. -Mild fatigue but this has not changed significantly. -No indication for transfusion support at this time. -We discussed avoiding unpasteurized foods, cooking vegetables, avoiding moldy cheeses and avoiding buffets or other areas potentially high in bacteria.   followup RTC with Dr Irene Limbo with labs in 2 months  .The total time spent in the appointment was 20 minutes* .  All of the patient's questions were answered with apparent  satisfaction. The patient knows to call the clinic with any problems, questions or concerns.   Sullivan Lone MD MS AAHIVMS [facility] Hematology/Oncology [facility]  .*Total Encounter Time as defined by the Centers for Medicare and Medicaid Services includes, in addition to the face-to-face time of a patient visit (documented in the note above) non-face-to-face time: obtaining and reviewing outside history, ordering and reviewing medications, tests or procedures, care coordination (communications with other health care professionals or caregivers) and documentation in the medical record.  I, Melene Muller, am acting as scribe for Dr. Sullivan Lone, MD.  .I have reviewed the above documentation for accuracy and completeness, and I agree with the above. Brunetta Genera MD

## 2021-09-10 NOTE — Addendum Note (Signed)
Addended by: Aris Georgia, Astin Sayre L on: 09/10/2021 09:14 AM   Modules accepted: Orders

## 2021-09-10 NOTE — Patient Instructions (Addendum)
Medication Instructions:  Your physician recommends that you continue on your current medications as directed. Please refer to the Current Medication list given to you today.  *If you need a refill on your cardiac medications before your next appointment, please call your pharmacy*  Lab Work: If you have labs (blood work) drawn today and your tests are completely normal, you will receive your results only by: [place] (if you have MyChart) OR A paper copy in the mail If you have any lab test that is abnormal or we need to change your treatment, we will call you to review the results.  Testing/Procedures: Your physician has requested that you have cardiac PET CT.   Follow-Up: At [facility], you and your health needs are our priority.  As part of our continuing mission to provide you with exceptional heart care, we have created designated Provider Care Teams.  These Care Teams include your primary Cardiologist (physician) and Advanced Practice Providers (APPs -  Physician Assistants and Nurse Practitioners) who all work together to provide you with the care you need, when you need it.  We recommend signing up for the patient portal called "MyChart".  Sign up information is provided on this After Visit Summary.  MyChart is used to connect with patients for Virtual Visits (Telemedicine).  Patients are able to view lab/test results, encounter notes, upcoming appointments, etc.  Non-urgent messages can be sent to your provider as well.   To learn more about what you can do with MyChart, go to [email or web address].    Your next appointment:   1 year(s)  The format for your next appointment:   In Person  Provider:   Jenkins Rouge, MD {  Other Instructions How to Prepare for Your Cardiac PET/CT Stress Test:  1. Please do not take these medications before your test:   Carvedilol -Medications that may interfere with the cardiac pharmacological stress agent (ex. nitrates -  including erectile dysfunction medications or beta-blockers) the day of the exam.  Your remaining medications may be taken with water.  2. Nothing to eat or drink, except water, 3 hours prior to arrival time.   NO caffeine/decaffeinated products, or chocolate 12 hours prior to arrival.  3. NO perfume, cologne or lotion  4. Total time is 1 to 2 hours; you may want to bring reading material for the waiting time.  5. Please report to Admitting at the [place] Entrance 60 minutes early for your test.  [place],  [id number]  In preparation for your appointment, medication and supplies will be purchased.  Appointment availability is limited, so if you need to cancel or reschedule, please call the Radiology Department at [phone]  24 hours in advance to avoid a cancellation fee of $100.00  What to Expect After you Arrive:  Once you arrive and check in for your appointment, you will be taken to a preparation room within the Radiology Department.  A technologist or Nurse will obtain your medical history, verify that you are correctly prepped for the exam, and explain the procedure.  Afterwards,  an IV will be started in your arm and electrodes will be placed on your skin for EKG monitoring during the stress portion of the exam. Then you will be escorted to the PET/CT scanner.  There, staff will get you positioned on the scanner and obtain a blood pressure and EKG.  During the exam, you will continue to be connected to the EKG and blood pressure  machines.  A small, safe amount of a radioactive tracer will be injected in your IV to obtain a series of pictures of your heart along with an injection of a stress agent.    After your Exam:  It is recommended that you eat a meal and drink a caffeinated beverage to counter act any effects of the stress agent.  Drink plenty of fluids for the remainder of the day and urinate frequently for the first couple of hours after  the exam.  Your doctor will inform you of your test results within 7-10 business days.  For questions about your test or how to prepare for your test, please call: Marchia Bond, Cardiac Imaging Nurse Navigator  Gordy Clement, Cardiac Imaging Nurse Navigator Office: [phone]   Important Information About Sugar

## 2021-09-11 ENCOUNTER — Ambulatory Visit: Payer: 59 | Admitting: Orthopaedic Surgery

## 2021-09-12 ENCOUNTER — Encounter: Payer: Self-pay | Admitting: Physical Therapy

## 2021-09-12 ENCOUNTER — Other Ambulatory Visit: Payer: Self-pay

## 2021-09-12 ENCOUNTER — Ambulatory Visit (INDEPENDENT_AMBULATORY_CARE_PROVIDER_SITE_OTHER): Payer: Medicare Other | Admitting: Physical Therapy

## 2021-09-12 ENCOUNTER — Encounter: Payer: Self-pay | Admitting: Hematology

## 2021-09-12 DIAGNOSIS — R29898 Other symptoms and signs involving the musculoskeletal system: Secondary | ICD-10-CM | POA: Diagnosis not present

## 2021-09-12 DIAGNOSIS — M5459 Other low back pain: Secondary | ICD-10-CM | POA: Diagnosis not present

## 2021-09-12 DIAGNOSIS — M6281 Muscle weakness (generalized): Secondary | ICD-10-CM

## 2021-09-12 DIAGNOSIS — R42 Dizziness and giddiness: Secondary | ICD-10-CM

## 2021-09-12 NOTE — Therapy (Signed)
OUTPATIENT PHYSICAL THERAPY THORACOLUMBAR EVALUATION   Patient Name: Karen Waller MRN: [id number] DOB:09-29-1955, 66 y.o., female Today's Date: 09/12/2021   PT End of Session - 09/12/21 0936     Visit Number 1    Number of Visits 12    Date for PT Re-Evaluation 10/24/21    Authorization Type [employer] 20% coinsurance    Authorization - Number of Visits 46    PT Start Time 5102    PT Stop Time 0927    PT Time Calculation (min) 32 min    Activity Tolerance Patient tolerated treatment well;Patient limited by pain;Treatment limited secondary to medical complications (Comment)   vertigo   Behavior During Therapy [facility] for tasks assessed/performed             Past Medical History:  Diagnosis Date   Cardiomyopathy    EF 30-35% diagnosed 02/2008   Central obesity    Gout    left knee   HTN (hypertension)    Pancytopenia ([place])    Past Surgical History:  Procedure Laterality Date   KNEE SURGERY     TONSILLECTOMY     Patient Active Problem List   Diagnosis Date Noted   Iron deficiency anemia 06/22/2019   Pain in left hip 03/24/2018   Bruising, spontaneous 07/06/2017   Left Achilles tendinitis 11/08/2016   Conductive hearing loss, bilateral 08/08/2016   Elevated lipids 12/16/2012   HTN (hypertension) 01/05/2011   CHF (congestive heart failure) ([place]) 10/30/2010   Edema 10/30/2010   Gouty arthropathy 03/17/2008   OVERWEIGHT 03/17/2008   ESSENTIAL HYPERTENSION, BENIGN 03/17/2008   Non-ischemic cardiomyopathy ([place]) 03/17/2008   PALPITATIONS, OCCASIONAL 03/17/2008    PCP: Carol Ada, MD  REFERRING PROVIDER: Carol Ada, MD  REFERRING DIAG: M54.50 Low Back Pain  Rationale for Evaluation and Treatment Rehabilitation  THERAPY DIAG:  Other low back pain - Plan: PT plan of care cert/re-cert  Muscle weakness (generalized) - Plan: PT plan of care cert/re-cert  Other symptoms and signs involving the musculoskeletal system - Plan: PT plan of care  cert/re-cert  Dizziness and giddiness - Plan: PT plan of care cert/re-cert  ONSET DATE: 5/85/27  SUBJECTIVE:                                                                                                                                                                                           SUBJECTIVE STATEMENT: Pt is a 66 y/o female who presents to [place] for acute onset of LBP x 2 weeks without known injury.  She reports increased tightness and pain.  She had steroid shot and prednisone which helped but she doesn't like taking  it so she only took it for 4 days.  She also reports new onset of vertigo which is impacting her mobility.  PERTINENT HISTORY:   myelodysplastic syndrome, cardiomyopathy, gout, HTN, pancytopenia  PAIN:  Are you having pain? Yes: NPRS scale: 7 currently, up to 10, at best 7/10 Pain location: Lt side low back, no radicular symptoms Pain description: throbbing, pressure Aggravating factors: standing more, transitional movement, stepping into car Relieving factors: sitting, tylenol   PRECAUTIONS: None  WEIGHT BEARING RESTRICTIONS No  FALLS:  Has patient fallen in last 6 months? No  LIVING ENVIRONMENT: Lives with: lives with their spouse Lives in: House/apartment Stairs: Yes: Internal: 14 steps; on left going up and External: 5 steps; bilateral but cannot reach both Has following equipment at home: None  OCCUPATION: full-time - makes technological parts; sitting/standing/bending; runs machines and light lifting  PLOF: Independent and Leisure: walking 3 miles  PATIENT GOALS improve pain   OBJECTIVE:   DIAGNOSTIC FINDINGS:  none  PATIENT SURVEYS:  09/12/21: FOTO 36 (predicted 66)  COGNITION: Overall cognitive status: Within functional limits for tasks assessed     SENSATION: WFL  POSTURE: rounded shoulders and forward head  PALPATION: Trigger points noted in Lt QL, lumbar paraspinals  LUMBAR ROM:   Active  A/PROM  eval  Flexion  Limited 50%   Extension Limited 25%  Right lateral flexion WNL  Left lateral flexion WNL  Right rotation WNL  Left rotation WNL (with pain)   (Blank rows = not tested)   LOWER EXTREMITY MMT:  deferred other testing due to difficulty with getting into testing positions  MMT Right eval Left eval  Hip flexion 4/5 3+/5  Hip extension    Hip abduction    Hip adduction    Hip internal rotation    Hip external rotation    Knee flexion    Knee extension    Ankle dorsiflexion    Ankle plantarflexion    Ankle inversion    Ankle eversion     (Blank rows = not tested)   GAIT: 09/12/21 Comments: gait slow and guarded; slow transitional movements as well    TODAY'S TREATMENT  09/12/21 See HEP - performed trial reps PRN for comprehension  Discussed DN today but pt elected to defer   PATIENT EDUCATION:  Education details: HEP, DN Person educated: Patient Education method: Explanation, Demonstration, and Handouts Education comprehension: verbalized understanding, returned demonstration, and needs further education   HOME EXERCISE PROGRAM: Access Code: [place] URL: [email or web address] Date: 09/12/2021 Prepared by: Faustino Congress  Exercises - Seated Single Knee to Chest  - 1 x daily - 7 x weekly - 1 sets - 3 reps - 20-30 hold - Seated Flexion Stretch with Swiss Ball  - 1 x daily - 7 x weekly - 1 sets - 3 reps - 20-30 hold - Seated Thoracic Flexion and Rotation with Swiss Ball  - 1 x daily - 7 x weekly - 1 sets - 3 reps - 20-30 hold - Self Release   - 1 x daily - 7 x weekly - 3 sets - 10 reps  Patient Education - Trigger Point Dry Needling  ASSESSMENT:  CLINICAL IMPRESSION: Patient is a 66 y.o. female who was seen today for physical therapy evaluation and treatment for LBP. She demonstrates decreased mobility and strength with active trigger points affecting functional mobility.  She also has new onset of vertigo impacting eval as well as functional  mobility.  Will plan to address this as well.  OBJECTIVE IMPAIRMENTS decreased mobility, difficulty walking, decreased ROM, decreased strength, dizziness, hypomobility, increased fascial restrictions, increased muscle spasms, postural dysfunction, and pain.   ACTIVITY LIMITATIONS carrying, lifting, bending, sitting, standing, squatting, sleeping, stairs, transfers, bed mobility, and locomotion level  PARTICIPATION LIMITATIONS: meal prep, cleaning, laundry, driving, community activity, and occupation  PERSONAL FACTORS 3+ comorbidities: myelodysplastic syndrome, cardiomyopathy, gout, HTN, pancytopenia, vertigo  are also affecting patient's functional outcome.   REHAB POTENTIAL: Good  CLINICAL DECISION MAKING: Evolving/moderate complexity  EVALUATION COMPLEXITY: Moderate   GOALS: Goals reviewed with patient? Yes  SHORT TERM GOALS: Target date: 10/03/2021  Independent with initial HEP Goal status: INITIAL   LONG TERM GOALS: Target date: 10/24/2021  Independent with final HEP Goal status: INITIAL  2.  FOTO score improved to 61 Goal status: INITIAL  3.  Lumbar AROM improved to WNL without pain for improved mobility and function Goal status: INIITAL  4.  Report pain < 3/10 with standing and walking >1 hour for improved function Goal status: INITIAL  5.  Report 75% improvement in dizziness for improved function Goal status: INITIAL     PLAN: PT FREQUENCY: 2x/week  PT DURATION: 6 weeks  PLANNED INTERVENTIONS: Therapeutic exercises, Therapeutic activity, Neuromuscular re-education, Balance training, Gait training, Patient/Family education, Self Care, Joint mobilization, Vestibular training, Canalith repositioning, Aquatic Therapy, Dry Needling, Electrical stimulation, Spinal manipulation, Spinal mobilization, Cryotherapy, Moist heat, Taping, Traction, Manual therapy, and Re-evaluation.  PLAN FOR NEXT SESSION: assess vertigo, DN if pt agrees (glute med and QL to start),  gradual increase in activity and exercises as vertigo allows   Laureen Abrahams, PT, DPT 09/12/21 9:48 AM

## 2021-09-13 ENCOUNTER — Ambulatory Visit (INDEPENDENT_AMBULATORY_CARE_PROVIDER_SITE_OTHER): Payer: 59 | Admitting: Physical Therapy

## 2021-09-13 ENCOUNTER — Telehealth: Payer: Self-pay | Admitting: Hematology

## 2021-09-13 ENCOUNTER — Encounter: Payer: Self-pay | Admitting: Physical Therapy

## 2021-09-13 DIAGNOSIS — R29898 Other symptoms and signs involving the musculoskeletal system: Secondary | ICD-10-CM | POA: Diagnosis not present

## 2021-09-13 DIAGNOSIS — M5459 Other low back pain: Secondary | ICD-10-CM | POA: Diagnosis not present

## 2021-09-13 DIAGNOSIS — M6281 Muscle weakness (generalized): Secondary | ICD-10-CM | POA: Diagnosis not present

## 2021-09-13 DIAGNOSIS — R42 Dizziness and giddiness: Secondary | ICD-10-CM

## 2021-09-13 NOTE — Therapy (Signed)
OUTPATIENT PHYSICAL THERAPY TREATMENT NOTE   Patient Name: Karen Waller MRN: [id number] DOB:1955/06/08, 66 y.o., female Today's Date: 09/13/2021   END OF SESSION:   PT End of Session - 09/13/21 1429     Visit Number 2    Number of Visits 12    Date for PT Re-Evaluation 10/24/21    Authorization Type [employer] 20% coinsurance    Authorization - Number of Visits 21    PT Start Time 0623    PT Stop Time 1510    PT Time Calculation (min) 38 min    Activity Tolerance Patient tolerated treatment well;Patient limited by pain;Treatment limited secondary to medical complications (Comment)   vertigo   Behavior During Therapy [facility] for tasks assessed/performed             Past Medical History:  Diagnosis Date   Cardiomyopathy    EF 30-35% diagnosed 02/2008   Central obesity    Gout    left knee   HTN (hypertension)    Pancytopenia ([place])    Past Surgical History:  Procedure Laterality Date   KNEE SURGERY     TONSILLECTOMY     Patient Active Problem List   Diagnosis Date Noted   Iron deficiency anemia 06/22/2019   Pain in left hip 03/24/2018   Bruising, spontaneous 07/06/2017   Left Achilles tendinitis 11/08/2016   Conductive hearing loss, bilateral 08/08/2016   Elevated lipids 12/16/2012   HTN (hypertension) 01/05/2011   CHF (congestive heart failure) ([place]) 10/30/2010   Edema 10/30/2010   Gouty arthropathy 03/17/2008   OVERWEIGHT 03/17/2008   ESSENTIAL HYPERTENSION, BENIGN 03/17/2008   Non-ischemic cardiomyopathy ([place]) 03/17/2008   PALPITATIONS, OCCASIONAL 03/17/2008    THERAPY DIAG:  Other low back pain  Muscle weakness (generalized)  Other symptoms and signs involving the musculoskeletal system  Dizziness and giddiness  PCP: Carol Ada, MD   REFERRING PROVIDER: Carol Ada, MD   REFERRING DIAG: M54.50 Low Back Pain   Rationale for Evaluation and Treatment Rehabilitation   ONSET DATE: 08/30/21   SUBJECTIVE:                                                                                                                                                                                             SUBJECTIVE STATEMENT: Ready to assess vertigo today, back exercises going well so far.   PERTINENT HISTORY:   myelodysplastic syndrome, cardiomyopathy, gout, HTN, pancytopenia   PAIN:  Are you having pain? Yes: NPRS scale: 7 currently, up to 10, at best 7/10 Pain location: Lt side low back, no radicular symptoms Pain description: throbbing, pressure Aggravating factors: standing more,  transitional movement, stepping into car Relieving factors: sitting, tylenol     PRECAUTIONS: None   WEIGHT BEARING RESTRICTIONS No  PATIENT GOALS improve pain     OBJECTIVE:    DIAGNOSTIC FINDINGS:  none   PATIENT SURVEYS:  09/12/21: FOTO 36 (predicted 61)   COGNITION: Overall cognitive status: Within functional limits for tasks assessed                          SENSATION: WFL   POSTURE: rounded shoulders and forward head   PALPATION: Trigger points noted in Lt QL, lumbar paraspinals   LUMBAR ROM:    Active  A/PROM  eval  Flexion Limited 50%   Extension Limited 25%  Right lateral flexion WNL  Left lateral flexion WNL  Right rotation WNL  Left rotation WNL (with pain)   (Blank rows = not tested)     LOWER EXTREMITY MMT:  deferred other testing due to difficulty with getting into testing positions   MMT Right eval Left eval  Hip flexion 4/5 3+/5  Hip extension      Hip abduction      Hip adduction      Hip internal rotation      Hip external rotation      Knee flexion      Knee extension      Ankle dorsiflexion      Ankle plantarflexion      Ankle inversion      Ankle eversion       (Blank rows = not tested)     GAIT: 09/12/21 Comments: gait slow and guarded; slow transitional movements as well       TODAY'S TREATMENT  09/13/21 Manual: STM with compression to Lt glute med; skilled palpation and monitoring of soft  tissue during DN  Trigger Point Dry-Needling  Treatment instructions: Expect mild to moderate muscle soreness. S/S of pneumothorax if dry needled over a lung field, and to seek immediate medical attention should they occur. Patient verbalized understanding of these instructions and education.  Patient Consent Given: Yes Education handout provided: Yes Muscles treated: Lt glute med Electrical stimulation performed: No Parameters: N/A Treatment response/outcome: twitch response noted with decreased tightness reported   VESTIBULAR ASSESSMENT & TREATMENT  GENERAL OBSERVATION: no symptoms at rest   SYMPTOM BEHAVIOR: Subjective history: 2 weeks Non-Vestibular symptoms:  none Type of dizziness: Spinning/Vertigo Frequency: every time she lies down or rolls to the right Duration: unsure; turns to the Lt to stop Aggravating factors: Induced by position change: lying supine, rolling to the right, and occasionally with looking up Relieving factors:  rolling back to Lt Progression of symptoms: unchanged  OCULOMOTOR EXAM: Ocular Alignment: normal Ocular ROM: No Limitations Spontaneous Nystagmus: absent Gaze-Induced Nystagmus: absent Smooth Pursuits: intact Saccades: intact    VESTIBULAR - OCULAR REFLEX:  Slow VOR: Normal Head-Impulse Test: HIT Right: negative HIT Left: negative   POSITIONAL TESTING: Right Sidelying: upbeating, right nystagmus and Duration: 8-10 sec     OTHOSTATICS: not done   Canalith Repositioning: Epley Right: Number of Reps: 1, Response to Treatment: symptoms improved, and Comment: able to lie on Rt side without symptoms following repositioning; started in side lying test   09/12/21 See HEP - performed trial reps PRN for comprehension  Discussed DN today but pt elected to defer     PATIENT EDUCATION:  Education details: Eval: HEP, DN / 09/13/21: BPPV Person educated: Patient Education method: Explanation, Demonstration, and Handouts Education  comprehension:  verbalized understanding, returned demonstration, and needs further education     HOME EXERCISE PROGRAM: Access Code: [email or web address] URL: [email or web address] Date: 09/12/2021 Prepared by: Faustino Congress   Exercises - Seated Single Knee to Chest  - 1 x daily - 7 x weekly - 1 sets - 3 reps - 20-30 hold - Seated Flexion Stretch with Swiss Ball  - 1 x daily - 7 x weekly - 1 sets - 3 reps - 20-30 hold - Seated Thoracic Flexion and Rotation with Swiss Ball  - 1 x daily - 7 x weekly - 1 sets - 3 reps - 20-30 hold - Self Release   - 1 x daily - 7 x weekly - 3 sets - 10 reps   Patient Education - Trigger Point Dry Needling   ASSESSMENT:   CLINICAL IMPRESSION: Pt with positive Rt pBPPV today and treated with single rep of Rt Epley's.  She elected to defer any additonal reps but was able to transfer sit to Rt sidelying without any vertigo symptoms after canalith repositioning.  Will continue to benefit from PT to address deficits listed.    OBJECTIVE IMPAIRMENTS decreased mobility, difficulty walking, decreased ROM, decreased strength, dizziness, hypomobility, increased fascial restrictions, increased muscle spasms, postural dysfunction, and pain.    ACTIVITY LIMITATIONS carrying, lifting, bending, sitting, standing, squatting, sleeping, stairs, transfers, bed mobility, and locomotion level   PARTICIPATION LIMITATIONS: meal prep, cleaning, laundry, driving, community activity, and occupation   PERSONAL FACTORS 3+ comorbidities: myelodysplastic syndrome, cardiomyopathy, gout, HTN, pancytopenia, vertigo  are also affecting patient's functional outcome.    REHAB POTENTIAL: Good   CLINICAL DECISION MAKING: Evolving/moderate complexity   EVALUATION COMPLEXITY: Moderate     GOALS: Goals reviewed with patient? Yes   SHORT TERM GOALS: Target date: 10/03/2021   Independent with initial HEP Goal status: INITIAL     LONG TERM GOALS: Target date: 10/24/2021    Independent with final HEP Goal status: INITIAL   2.  FOTO score improved to 61 Goal status: INITIAL   3.  Lumbar AROM improved to WNL without pain for improved mobility and function Goal status: INIITAL   4.  Report pain < 3/10 with standing and walking >1 hour for improved function Goal status: INITIAL   5.  Report 75% improvement in dizziness for improved function Goal status: INITIAL         PLAN: PT FREQUENCY: 2x/week   PT DURATION: 6 weeks   PLANNED INTERVENTIONS: Therapeutic exercises, Therapeutic activity, Neuromuscular re-education, Balance training, Gait training, Patient/Family education, Self Care, Joint mobilization, Vestibular training, Canalith repositioning, Aquatic Therapy, Dry Needling, Electrical stimulation, Spinal manipulation, Spinal mobilization, Cryotherapy, Moist heat, Taping, Traction, Manual therapy, and Re-evaluation.   PLAN FOR NEXT SESSION:  reassess vertigo PRN (may need to check additional canals if not cleared), assess response to DN of glute med (may need QL as well), gradual increase in activity and exercises as vertigo allows   Laureen Abrahams, PT, DPT 09/13/21 3:29 PM

## 2021-09-13 NOTE — Telephone Encounter (Signed)
Scheduled follow-up appointment per 7/24 los. Patient is aware. 

## 2021-09-17 ENCOUNTER — Encounter: Payer: Self-pay | Admitting: Hematology

## 2021-09-17 MED ORDER — NYSTATIN 100000 UNIT/ML MT SUSP: 5.0000 mL | Freq: Four times a day (QID) | OROMUCOSAL | 0 refills | Status: DC

## 2021-09-18 ENCOUNTER — Ambulatory Visit: Payer: 59 | Admitting: Physical Therapy

## 2021-09-18 ENCOUNTER — Encounter: Payer: Self-pay | Admitting: Physical Therapy

## 2021-09-18 DIAGNOSIS — M6281 Muscle weakness (generalized): Secondary | ICD-10-CM

## 2021-09-18 DIAGNOSIS — M5459 Other low back pain: Secondary | ICD-10-CM

## 2021-09-18 DIAGNOSIS — R29898 Other symptoms and signs involving the musculoskeletal system: Secondary | ICD-10-CM | POA: Diagnosis not present

## 2021-09-18 NOTE — Therapy (Signed)
OUTPATIENT PHYSICAL THERAPY TREATMENT NOTE   Patient Name: Karen Waller MRN: [id number] DOB:04/30/1955, 66 y.o., female Today's Date: 09/18/2021   END OF SESSION:   PT End of Session - 09/18/21 0938     Visit Number 3    Number of Visits 12    Date for PT Re-Evaluation 10/24/21    Authorization Type [employer] 20% coinsurance    Authorization - Number of Visits 35    PT Start Time 7893    PT Stop Time 8101    PT Time Calculation (min) 33 min    Activity Tolerance Patient tolerated treatment well;Patient limited by pain;Treatment limited secondary to medical complications (Comment)   vertigo   Behavior During Therapy [facility] for tasks assessed/performed              Past Medical History:  Diagnosis Date   Cardiomyopathy    EF 30-35% diagnosed 02/2008   Central obesity    Gout    left knee   HTN (hypertension)    Pancytopenia ([place])    Past Surgical History:  Procedure Laterality Date   KNEE SURGERY     TONSILLECTOMY     Patient Active Problem List   Diagnosis Date Noted   Iron deficiency anemia 06/22/2019   Pain in left hip 03/24/2018   Bruising, spontaneous 07/06/2017   Left Achilles tendinitis 11/08/2016   Conductive hearing loss, bilateral 08/08/2016   Elevated lipids 12/16/2012   HTN (hypertension) 01/05/2011   CHF (congestive heart failure) ([place]) 10/30/2010   Edema 10/30/2010   Gouty arthropathy 03/17/2008   OVERWEIGHT 03/17/2008   ESSENTIAL HYPERTENSION, BENIGN 03/17/2008   Non-ischemic cardiomyopathy ([place]) 03/17/2008   PALPITATIONS, OCCASIONAL 03/17/2008    THERAPY DIAG:  Other low back pain  Muscle weakness (generalized)  Other symptoms and signs involving the musculoskeletal system  PCP: Carol Ada, MD   REFERRING PROVIDER: Carol Ada, MD   REFERRING DIAG: M54.50 Low Back Pain   Rationale for Evaluation and Treatment Rehabilitation   ONSET DATE: 08/30/21   SUBJECTIVE:                                                                                                                                                                                             SUBJECTIVE STATEMENT: Vertigo is resolved; back pain was improved over the weekend then flared back up yesterday (Monday).      PERTINENT HISTORY:   myelodysplastic syndrome, cardiomyopathy, gout, HTN, pancytopenia   PAIN:  Are you having pain? Yes: NPRS scale: 6 currently, up to 8/10 Pain location: Lt side low back, no radicular symptoms Pain description: throbbing, pressure Aggravating factors:  standing more, transitional movement, stepping into car Relieving factors: sitting, tylenol     PRECAUTIONS: None   WEIGHT BEARING RESTRICTIONS No  PATIENT GOALS improve pain     OBJECTIVE:    PATIENT SURVEYS:  09/12/21: FOTO 36 (predicted 61)   PALPATION: Trigger points noted in Lt QL, lumbar paraspinals   LUMBAR ROM:    Active  A/PROM  eval  Flexion Limited 50%   Extension Limited 25%  Right lateral flexion WNL  Left lateral flexion WNL  Right rotation WNL  Left rotation WNL (with pain)   (Blank rows = not tested)     LOWER EXTREMITY MMT:  deferred other testing due to difficulty with getting into testing positions   MMT Right eval Left eval  Hip flexion 4/5 3+/5  Hip extension      Hip abduction      Hip adduction      Hip internal rotation      Hip external rotation      Knee flexion      Knee extension      Ankle dorsiflexion      Ankle plantarflexion      Ankle inversion      Ankle eversion       (Blank rows = not tested)     GAIT: 09/12/21 Comments: gait slow and guarded; slow transitional movements as well     TODAY'S TREATMENT  09/18/21 Therex: Mid and to Rt seated thoracolumbar flexion stretch 3x20 sec each with green physioball (performed after manual and DN as well with decr tightness reported) Seated single knee to chest 3x20 sec bil Seated piriformis stretch 3x20 sec bil  Manual: STM with compression to Lt QL and lumbar  paraspinals; skilled palpation and monitoring of soft tissue during DN  Trigger Point Dry-Needling  Treatment instructions: Expect mild to moderate muscle soreness. S/S of pneumothorax if dry needled over a lung field, and to seek immediate medical attention should they occur. Patient verbalized understanding of these instructions and education.  Patient Consent Given: Yes Education handout provided: Yes Muscles treated: Lt QL and lumbar paraspinals (in sidelying) Electrical stimulation performed: No Parameters: N/A Treatment response/outcome: twitch response noted with decreased tightness reported  09/13/21 Manual: STM with compression to Lt glute med; skilled palpation and monitoring of soft tissue during DN  Trigger Point Dry-Needling  Treatment instructions: Expect mild to moderate muscle soreness. S/S of pneumothorax if dry needled over a lung field, and to seek immediate medical attention should they occur. Patient verbalized understanding of these instructions and education.  Patient Consent Given: Yes Education handout provided: Yes Muscles treated: Lt glute med Electrical stimulation performed: No Parameters: N/A Treatment response/outcome: twitch response noted with decreased tightness reported   VESTIBULAR ASSESSMENT & TREATMENT  GENERAL OBSERVATION: no symptoms at rest   SYMPTOM BEHAVIOR: Subjective history: 2 weeks Non-Vestibular symptoms:  none Type of dizziness: Spinning/Vertigo Frequency: every time she lies down or rolls to the right Duration: unsure; turns to the Lt to stop Aggravating factors: Induced by position change: lying supine, rolling to the right, and occasionally with looking up Relieving factors:  rolling back to Lt Progression of symptoms: unchanged  OCULOMOTOR EXAM: Ocular Alignment: normal Ocular ROM: No Limitations Spontaneous Nystagmus: absent Gaze-Induced Nystagmus: absent Smooth Pursuits: intact Saccades: intact    VESTIBULAR -  OCULAR REFLEX:  Slow VOR: Normal Head-Impulse Test: HIT Right: negative HIT Left: negative   POSITIONAL TESTING: Right Sidelying: upbeating, right nystagmus and Duration: 8-10 sec  OTHOSTATICS: not done   Canalith Repositioning: Epley Right: Number of Reps: 1, Response to Treatment: symptoms improved, and Comment: able to lie on Rt side without symptoms following repositioning; started in side lying test   09/12/21 See HEP - performed trial reps PRN for comprehension  Discussed DN today but pt elected to defer     PATIENT EDUCATION:  Education details: Eval: HEP, DN / 09/13/21: BPPV Person educated: Patient Education method: Explanation, Demonstration, and Handouts Education comprehension: verbalized understanding, returned demonstration, and needs further education     HOME EXERCISE PROGRAM: Access Code: [place] URL: [email or web address] Date: 09/12/2021 Prepared by: Faustino Congress   Exercises - Seated Single Knee to Chest  - 1 x daily - 7 x weekly - 1 sets - 3 reps - 20-30 hold - Seated Flexion Stretch with Swiss Ball  - 1 x daily - 7 x weekly - 1 sets - 3 reps - 20-30 hold - Seated Thoracic Flexion and Rotation with Swiss Ball  - 1 x daily - 7 x weekly - 1 sets - 3 reps - 20-30 hold - Self Release   - 1 x daily - 7 x weekly - 3 sets - 10 reps   Patient Education - Trigger Point Dry Needling   ASSESSMENT:   CLINICAL IMPRESSION: Overall reporting reduction in pain today but still elevated.  Positive response to DN after last session, and vertigo also resolved.  Will continue to benefit from PT to maximize function.    OBJECTIVE IMPAIRMENTS decreased mobility, difficulty walking, decreased ROM, decreased strength, dizziness, hypomobility, increased fascial restrictions, increased muscle spasms, postural dysfunction, and pain.    ACTIVITY LIMITATIONS carrying, lifting, bending, sitting, standing, squatting, sleeping, stairs, transfers, bed  mobility, and locomotion level   PARTICIPATION LIMITATIONS: meal prep, cleaning, laundry, driving, community activity, and occupation   PERSONAL FACTORS 3+ comorbidities: myelodysplastic syndrome, cardiomyopathy, gout, HTN, pancytopenia, vertigo  are also affecting patient's functional outcome.    REHAB POTENTIAL: Good   CLINICAL DECISION MAKING: Evolving/moderate complexity   EVALUATION COMPLEXITY: Moderate     GOALS: Goals reviewed with patient? Yes   SHORT TERM GOALS: Target date: 10/03/2021   Independent with initial HEP Goal status: MET 09/18/21     LONG TERM GOALS: Target date: 10/24/2021   Independent with final HEP Goal status: INITIAL   2.  FOTO score improved to 61 Goal status: INITIAL   3.  Lumbar AROM improved to WNL without pain for improved mobility and function Goal status: INIITAL   4.  Report pain < 3/10 with standing and walking >1 hour for improved function Goal status: INITIAL   5.  Report 75% improvement in dizziness for improved function Goal status: INITIAL         PLAN: PT FREQUENCY: 2x/week   PT DURATION: 6 weeks   PLANNED INTERVENTIONS: Therapeutic exercises, Therapeutic activity, Neuromuscular re-education, Balance training, Gait training, Patient/Family education, Self Care, Joint mobilization, Vestibular training, Canalith repositioning, Aquatic Therapy, Dry Needling, Electrical stimulation, Spinal manipulation, Spinal mobilization, Cryotherapy, Moist heat, Taping, Traction, Manual therapy, and Re-evaluation.   PLAN FOR NEXT SESSION:  reassess vertigo PRN (may need to check additional canals if not cleared), assess response to DN of QL, paraspinals, hip/core strengthening  Laureen Abrahams, PT, DPT 09/18/21 10:10 AM

## 2021-09-21 ENCOUNTER — Encounter: Payer: Self-pay | Admitting: Physical Therapy

## 2021-09-21 ENCOUNTER — Ambulatory Visit (INDEPENDENT_AMBULATORY_CARE_PROVIDER_SITE_OTHER): Payer: 59 | Admitting: Physical Therapy

## 2021-09-21 DIAGNOSIS — R42 Dizziness and giddiness: Secondary | ICD-10-CM | POA: Diagnosis not present

## 2021-09-21 DIAGNOSIS — R29898 Other symptoms and signs involving the musculoskeletal system: Secondary | ICD-10-CM

## 2021-09-21 DIAGNOSIS — M6281 Muscle weakness (generalized): Secondary | ICD-10-CM | POA: Diagnosis not present

## 2021-09-21 DIAGNOSIS — M5459 Other low back pain: Secondary | ICD-10-CM

## 2021-09-21 NOTE — Therapy (Signed)
OUTPATIENT PHYSICAL THERAPY TREATMENT NOTE   Patient Name: LAYSA KIMMEY MRN: [id number] DOB:1955/11/16, 66 y.o., female Today's Date: 09/21/2021   END OF SESSION:   PT End of Session - 09/21/21 0944     Visit Number 4    Number of Visits 12    Date for PT Re-Evaluation 10/24/21    Authorization Type [employer] 20% coinsurance    Authorization - Number of Visits 17    PT Start Time [id number]    PT Stop Time 1026   last 10 min on heat   PT Time Calculation (min) 48 min    Activity Tolerance Patient tolerated treatment well   vertigo   Behavior During Therapy WFL for tasks assessed/performed              Past Medical History:  Diagnosis Date   Cardiomyopathy    EF 30-35% diagnosed 02/2008   Central obesity    Gout    left knee   HTN (hypertension)    Pancytopenia ([place])    Past Surgical History:  Procedure Laterality Date   KNEE SURGERY     TONSILLECTOMY     Patient Active Problem List   Diagnosis Date Noted   Iron deficiency anemia 06/22/2019   Pain in left hip 03/24/2018   Bruising, spontaneous 07/06/2017   Left Achilles tendinitis 11/08/2016   Conductive hearing loss, bilateral 08/08/2016   Elevated lipids 12/16/2012   HTN (hypertension) 01/05/2011   CHF (congestive heart failure) ([place]) 10/30/2010   Edema 10/30/2010   Gouty arthropathy 03/17/2008   OVERWEIGHT 03/17/2008   ESSENTIAL HYPERTENSION, BENIGN 03/17/2008   Non-ischemic cardiomyopathy ([place]) 03/17/2008   PALPITATIONS, OCCASIONAL 03/17/2008    THERAPY DIAG:  Other low back pain  Muscle weakness (generalized)  Other symptoms and signs involving the musculoskeletal system  Dizziness and giddiness  PCP: Carol Ada, MD   REFERRING PROVIDER: Carol Ada, MD   REFERRING DIAG: M54.50 Low Back Pain   Rationale for Evaluation and Treatment Rehabilitation   ONSET DATE: 08/30/21                                                                                                                                                                                         SUBJECTIVE STATEMENT: No more complaints of vertigo. She says she has bruise and is sore from DN.    PERTINENT HISTORY:   myelodysplastic syndrome, cardiomyopathy, gout, HTN, pancytopenia   PAIN:  Are you having pain? Yes: NPRS scale: 7/10 currently Pain location: Lt side low back, no radicular symptoms Pain description: throbbing, pressure Aggravating factors: standing more, transitional movement, stepping into car Relieving factors: sitting, tylenol  PRECAUTIONS: None   WEIGHT BEARING RESTRICTIONS No  PATIENT GOALS improve pain     OBJECTIVE:    PATIENT SURVEYS:  09/12/21: FOTO 36 (predicted 61)   PALPATION: Trigger points noted in Lt QL, lumbar paraspinals   LUMBAR ROM:    Active  A/PROM  eval  Flexion Limited 50%   Extension Limited 25%  Right lateral flexion WNL  Left lateral flexion WNL  Right rotation WNL  Left rotation WNL (with pain)   (Blank rows = not tested)     LOWER EXTREMITY MMT:  deferred other testing due to difficulty with getting into testing positions   MMT Right eval Left eval  Hip flexion 4/5 3+/5  Hip extension      Hip abduction      Hip adduction      Hip internal rotation      Hip external rotation      Knee flexion      Knee extension      Ankle dorsiflexion      Ankle plantarflexion      Ankle inversion      Ankle eversion       (Blank rows = not tested)     GAIT: 09/12/21 Comments: gait slow and guarded; slow transitional movements as well     TODAY'S TREATMENT  09/21/21 -Nu step L6 X 6 min UE/LE -Seated lumbar flexion stretch Pball roll outs 5 sec X 10 -Supine single knee to chest 3x20 sec bil -Supine piriformis stretch 3X20 sec bilat -Supine bridges X15 in smaller ROM -Low trunk rotations 5 sec X 5 bilat -Seated ab set with pball isometric shoulder extension with hands on top of ball in her lap 5 sec X10 -Seated ab set with pball isometric hip  flexion with hands on top of ball in her lap 5 sec X10 -Standing lumbar extension X10 -Moist heat at end of session X10 min to lumbar   09/18/21 Therex: Mid and to Rt seated thoracolumbar flexion stretch 3x20 sec each with green physioball (performed after manual and DN as well with decr tightness reported) Seated single knee to chest 3x20 sec bil Seated piriformis stretch 3x20 sec bil  Manual: STM with compression to Lt QL and lumbar paraspinals; skilled palpation and monitoring of soft tissue during DN  Trigger Point Dry-Needling  Treatment instructions: Expect mild to moderate muscle soreness. S/S of pneumothorax if dry needled over a lung field, and to seek immediate medical attention should they occur. Patient verbalized understanding of these instructions and education.  Patient Consent Given: Yes Education handout provided: Yes Muscles treated: Lt QL and lumbar paraspinals (in sidelying) Electrical stimulation performed: No Parameters: N/A Treatment response/outcome: twitch response noted with decreased tightness reported    VESTIBULAR ASSESSMENT & TREATMENT  GENERAL OBSERVATION: no symptoms at rest   SYMPTOM BEHAVIOR: Subjective history: 2 weeks Non-Vestibular symptoms:  none Type of dizziness: Spinning/Vertigo Frequency: every time she lies down or rolls to the right Duration: unsure; turns to the Lt to stop Aggravating factors: Induced by position change: lying supine, rolling to the right, and occasionally with looking up Relieving factors:  rolling back to Lt Progression of symptoms: unchanged  OCULOMOTOR EXAM: Ocular Alignment: normal Ocular ROM: No Limitations Spontaneous Nystagmus: absent Gaze-Induced Nystagmus: absent Smooth Pursuits: intact Saccades: intact    VESTIBULAR - OCULAR REFLEX:  Slow VOR: Normal Head-Impulse Test: HIT Right: negative HIT Left: negative   POSITIONAL TESTING: Right Sidelying: upbeating, right nystagmus and Duration: 8-10  sec       OTHOSTATICS: not done   Canalith Repositioning: Epley Right: Number of Reps: 1, Response to Treatment: symptoms improved, and Comment: able to lie on Rt side without symptoms following repositioning; started in side lying test   09/12/21 See HEP - performed trial reps PRN for comprehension  Discussed DN today but pt elected to defer     PATIENT EDUCATION:  Education details: Eval: HEP, DN / 09/13/21: BPPV Person educated: Patient Education method: Explanation, Demonstration, and Handouts Education comprehension: verbalized understanding, returned demonstration, and needs further education     HOME EXERCISE PROGRAM: Access Code: [place] URL: [email or web address] Date: 09/12/2021 Prepared by: Faustino Congress   Exercises - Seated Single Knee to Chest  - 1 x daily - 7 x weekly - 1 sets - 3 reps - 20-30 hold - Seated Flexion Stretch with Swiss Ball  - 1 x daily - 7 x weekly - 1 sets - 3 reps - 20-30 hold - Seated Thoracic Flexion and Rotation with Swiss Ball  - 1 x daily - 7 x weekly - 1 sets - 3 reps - 20-30 hold - Self Release   - 1 x daily - 7 x weekly - 3 sets - 10 reps   Patient Education - Trigger Point Dry Needling   ASSESSMENT:   CLINICAL IMPRESSION: Vertigo has been resolved so continued to focus on her back pain. I held DN to to bruise and soreness but she may benefit from this intervention in the future if she is open to this again. Vertigo goal has been met, other long term goals ongoing.    OBJECTIVE IMPAIRMENTS decreased mobility, difficulty walking, decreased ROM, decreased strength, dizziness, hypomobility, increased fascial restrictions, increased muscle spasms, postural dysfunction, and pain.    ACTIVITY LIMITATIONS carrying, lifting, bending, sitting, standing, squatting, sleeping, stairs, transfers, bed mobility, and locomotion level   PARTICIPATION LIMITATIONS: meal prep, cleaning, laundry, driving, community activity, and  occupation   PERSONAL FACTORS 3+ comorbidities: myelodysplastic syndrome, cardiomyopathy, gout, HTN, pancytopenia, vertigo  are also affecting patient's functional outcome.    REHAB POTENTIAL: Good   CLINICAL DECISION MAKING: Evolving/moderate complexity   EVALUATION COMPLEXITY: Moderate     GOALS: Goals reviewed with patient? Yes   SHORT TERM GOALS: Target date: 10/03/2021   Independent with initial HEP Goal status: MET 09/18/21     LONG TERM GOALS: Target date: 10/24/2021   Independent with final HEP Goal status: ongoing   2.  FOTO score improved to 61 Goal status: ongoing   3.  Lumbar AROM improved to WNL without pain for improved mobility and function Goal status: ongoing   4.  Report pain < 3/10 with standing and walking >1 hour for improved function Goal status: ongoing pain is about 7/10 09/21/21   5.  Report 75% improvement in dizziness for improved function Goal status: MET 09/21/21         PLAN: PT FREQUENCY: 2x/week   PT DURATION: 6 weeks   PLANNED INTERVENTIONS: Therapeutic exercises, Therapeutic activity, Neuromuscular re-education, Balance training, Gait training, Patient/Family education, Self Care, Joint mobilization, Vestibular training, Canalith repositioning, Aquatic Therapy, Dry Needling, Electrical stimulation, Spinal manipulation, Spinal mobilization, Cryotherapy, Moist heat, Taping, Traction, Manual therapy, and Re-evaluation.   PLAN FOR NEXT SESSION:  DN if desired hip/core strengthening, lumbar mobility  Elsie Ra, PT, DPT 09/21/21 10:30 AM

## 2021-09-26 ENCOUNTER — Ambulatory Visit (INDEPENDENT_AMBULATORY_CARE_PROVIDER_SITE_OTHER): Payer: 59 | Admitting: Physical Therapy

## 2021-09-26 ENCOUNTER — Encounter: Payer: Self-pay | Admitting: Physical Therapy

## 2021-09-26 DIAGNOSIS — R42 Dizziness and giddiness: Secondary | ICD-10-CM

## 2021-09-26 DIAGNOSIS — M5459 Other low back pain: Secondary | ICD-10-CM | POA: Diagnosis not present

## 2021-09-26 DIAGNOSIS — R29898 Other symptoms and signs involving the musculoskeletal system: Secondary | ICD-10-CM

## 2021-09-26 DIAGNOSIS — M6281 Muscle weakness (generalized): Secondary | ICD-10-CM

## 2021-09-26 NOTE — Therapy (Signed)
OUTPATIENT PHYSICAL THERAPY TREATMENT NOTE   Patient Name: Karen Waller MRN: [id number] DOB:[birth year], 66 y.o., female Today's Date: 09/26/2021   END OF SESSION:   PT End of Session - 09/26/21 1308     Visit Number 5    Number of Visits 12    Date for PT Re-Evaluation 10/24/21    Authorization Type [employer] 20% coinsurance    Authorization - Number of Visits 52    PT Start Time 1300    PT Stop Time 9528    PT Time Calculation (min) 45 min    Activity Tolerance Patient tolerated treatment well   vertigo   Behavior During Therapy WFL for tasks assessed/performed              Past Medical History:  Diagnosis Date   Cardiomyopathy    EF 30-35% diagnosed 02/2008   Central obesity    Gout    left knee   HTN (hypertension)    Pancytopenia ([place])    Past Surgical History:  Procedure Laterality Date   KNEE SURGERY     TONSILLECTOMY     Patient Active Problem List   Diagnosis Date Noted   Iron deficiency anemia 06/22/2019   Pain in left hip 03/24/2018   Bruising, spontaneous 07/06/2017   Left Achilles tendinitis 11/08/2016   Conductive hearing loss, bilateral 08/08/2016   Elevated lipids 12/16/2012   HTN (hypertension) 01/05/2011   CHF (congestive heart failure) ([place]) 10/30/2010   Edema 10/30/2010   Gouty arthropathy 03/17/2008   OVERWEIGHT 03/17/2008   ESSENTIAL HYPERTENSION, BENIGN 03/17/2008   Non-ischemic cardiomyopathy ([place]) 03/17/2008   PALPITATIONS, OCCASIONAL 03/17/2008    THERAPY DIAG:  Other low back pain  Muscle weakness (generalized)  Other symptoms and signs involving the musculoskeletal system  Dizziness and giddiness  PCP: Carol Ada, MD   REFERRING PROVIDER: Carol Ada, MD   REFERRING DIAG: M54.50 Low Back Pain   Rationale for Evaluation and Treatment Rehabilitation   ONSET DATE: 08/30/21                                                                                                                                                                                         SUBJECTIVE STATEMENT:  She still denies any vertigo or radicular symptoms, pain is getting better but still there with some soreness in her low back and she feels she walks crooked.   PERTINENT HISTORY:   myelodysplastic syndrome, cardiomyopathy, gout, HTN, pancytopenia   PAIN:  Are you having pain? Yes: NPRS scale: 4/10 currently Pain location: Lt side low back, no radicular symptoms Pain description: throbbing, pressure Aggravating factors: standing more, transitional movement, stepping  into car Relieving factors: sitting, tylenol     PRECAUTIONS: None   WEIGHT BEARING RESTRICTIONS No  PATIENT GOALS improve pain     OBJECTIVE:    PATIENT SURVEYS:  09/12/21: FOTO 36 (predicted 61)   PALPATION: Trigger points noted in Lt QL, lumbar paraspinals   LUMBAR ROM:    Active  A/PROM  eval 09/26/21  Flexion Limited 50%  Limited 25%  Extension Limited 25% Limited 25%  Right lateral flexion WNL   Left lateral flexion WNL   Right rotation WNL   Left rotation WNL (with pain)    (Blank rows = not tested)     LOWER EXTREMITY MMT:  deferred other testing due to difficulty with getting into testing positions   MMT Right eval Left eval  Hip flexion 4/5 3+/5  Hip extension      Hip abduction      Hip adduction      Hip internal rotation      Hip external rotation      Knee flexion      Knee extension      Ankle dorsiflexion      Ankle plantarflexion      Ankle inversion      Ankle eversion       (Blank rows = not tested)     GAIT: 09/12/21 Comments: gait slow and guarded; slow transitional movements as well     TODAY'S TREATMENT  09/26/21 -Nu step L6 X 7 min UE/LE -Seated lumbar flexion stretch Pball roll outs 5 sec X 10 -Standing lumbar extension X10 -Standing rows with green X 20 bilat -Standing shoulder extensions with green X20 bilat - Standing hip abductions with red X 15 bilat  -Standing hip extensions with red X 15  bilat  -Moist heat at end of session X7 min to lumbar  09/21/21 -Nu step L6 X 6 min UE/LE -Seated lumbar flexion stretch Pball roll outs 5 sec X 10 -Supine single knee to chest 3x20 sec bil -Supine piriformis stretch 3X20 sec bilat -Supine bridges X15 in smaller ROM -Low trunk rotations 5 sec X 5 bilat -Seated ab set with pball isometric shoulder extension with hands on top of ball in her lap 5 sec X10 -Seated ab set with pball isometric hip flexion with hands on top of ball in her lap 5 sec X10 -Standing lumbar extension X10 -Moist heat at end of session X10 min to lumbar   09/18/21 Therex: Mid and to Rt seated thoracolumbar flexion stretch 3x20 sec each with green physioball (performed after manual and DN as well with decr tightness reported) Seated single knee to chest 3x20 sec bil Seated piriformis stretch 3x20 sec bil  Manual: STM with compression to Lt QL and lumbar paraspinals; skilled palpation and monitoring of soft tissue during DN  Trigger Point Dry-Needling  Treatment instructions: Expect mild to moderate muscle soreness. S/S of pneumothorax if dry needled over a lung field, and to seek immediate medical attention should they occur. Patient verbalized understanding of these instructions and education.  Patient Consent Given: Yes Education handout provided: Yes Muscles treated: Lt QL and lumbar paraspinals (in sidelying) Electrical stimulation performed: No Parameters: N/A Treatment response/outcome: twitch response noted with decreased tightness reported    VESTIBULAR ASSESSMENT & TREATMENT  GENERAL OBSERVATION: no symptoms at rest   SYMPTOM BEHAVIOR: Subjective history: 2 weeks Non-Vestibular symptoms:  none Type of dizziness: Spinning/Vertigo Frequency: every time she lies down or rolls to the right Duration: unsure; turns to the Lt to  stop Aggravating factors: Induced by position change: lying supine, rolling to the right, and occasionally with looking  up Relieving factors:  rolling back to Lt Progression of symptoms: unchanged  OCULOMOTOR EXAM: Ocular Alignment: normal Ocular ROM: No Limitations Spontaneous Nystagmus: absent Gaze-Induced Nystagmus: absent Smooth Pursuits: intact Saccades: intact    VESTIBULAR - OCULAR REFLEX:  Slow VOR: Normal Head-Impulse Test: HIT Right: negative HIT Left: negative   POSITIONAL TESTING: Right Sidelying: upbeating, right nystagmus and Duration: 8-10 sec     OTHOSTATICS: not done   Canalith Repositioning: E[place]y Right: Number of Reps: 1, Response to Treatment: symptoms improved, and Comment: able to lie on Rt side without symptoms following repositioning; started in side lying test   09/12/21 See HEP - performed trial reps PRN for comprehension  Discussed DN today but pt elected to defer     PATIENT EDUCATION:  Education details: Eval: HEP, DN / 09/13/21: BPPV Person educated: Patient Education method: Explanation, Demonstration, and Handouts Education comprehension: verbalized understanding, returned demonstration, and needs further education     HOME EXERCISE PROGRAM: Access Code: [place] URL: [email or web address] Date: 09/12/2021 Prepared by: Faustino Congress   Exercises - Seated Single Knee to Chest  - 1 x daily - 7 x weekly - 1 sets - 3 reps - 20-30 hold - Seated Flexion Stretch with Swiss Ball  - 1 x daily - 7 x weekly - 1 sets - 3 reps - 20-30 hold - Seated Thoracic Flexion and Rotation with Swiss Ball  - 1 x daily - 7 x weekly - 1 sets - 3 reps - 20-30 hold - Self Release   - 1 x daily - 7 x weekly - 3 sets - 10 reps   Patient Education - Trigger Point Dry Needling   ASSESSMENT:   CLINICAL IMPRESSION: She appears to be making some progress but still with pain and weakness in low back/hip which we continued to work to improve with exercise program. Her lumbar flexion ROM has made notable improvements with less pain since starting PT. She does still  have pain with extension however. She relays her left knee is getting weak so I showed her seated SLR,sit to stands and step up exercises she can try at home for this and we can certainly incorporate more left knee strength into her PT exercises as well.     OBJECTIVE IMPAIRMENTS decreased mobility, difficulty walking, decreased ROM, decreased strength, dizziness, hypomobility, increased fascial restrictions, increased muscle spasms, postural dysfunction, and pain.    ACTIVITY LIMITATIONS carrying, lifting, bending, sitting, standing, squatting, sleeping, stairs, transfers, bed mobility, and locomotion level   PARTICIPATION LIMITATIONS: meal prep, cleaning, laundry, driving, community activity, and occupation   PERSONAL FACTORS 3+ comorbidities: myelodysplastic syndrome, cardiomyopathy, gout, HTN, pancytopenia, vertigo  are also affecting patient's functional outcome.    REHAB POTENTIAL: Good   CLINICAL DECISION MAKING: Evolving/moderate complexity   EVALUATION COMPLEXITY: Moderate     GOALS: Goals reviewed with patient? Yes   SHORT TERM GOALS: Target date: 10/03/2021   Independent with initial HEP Goal status: MET 09/18/21     LONG TERM GOALS: Target date: 10/24/2021   Independent with final HEP Goal status: ongoing   2.  FOTO score improved to 61 Goal status: ongoing   3.  Lumbar AROM improved to WNL without pain for improved mobility and function Goal status: ongoing   4.  Report pain < 3/10 with standing and walking >1 hour for improved function Goal status: ongoing pain is about 7/10 09/21/21  5.  Report 75% improvement in dizziness for improved function Goal status: MET 09/21/21         PLAN: PT FREQUENCY: 2x/week   PT DURATION: 6 weeks   PLANNED INTERVENTIONS: Therapeutic exercises, Therapeutic activity, Neuromuscular re-education, Balance training, Gait training, Patient/Family education, Self Care, Joint mobilization, Vestibular training, Canalith repositioning,  Aquatic Therapy, Dry Needling, Electrical stimulation, Spinal manipulation, Spinal mobilization, Cryotherapy, Moist heat, Taping, Traction, Manual therapy, and Re-evaluation.   PLAN FOR NEXT SESSION:  knee/hip/core strengthening to tolerance, lumbar mobility  Elsie Ra, PT, DPT 09/26/21 1:09 PM

## 2021-09-27 ENCOUNTER — Encounter: Payer: 59 | Admitting: Physical Therapy

## 2021-09-27 ENCOUNTER — Encounter: Payer: Self-pay | Admitting: Hematology

## 2021-10-01 NOTE — Addendum Note (Signed)
Addended by: Josue Hector on: 10/01/2021 03:26 PM   Modules accepted: Orders

## 2021-10-02 ENCOUNTER — Encounter: Payer: Self-pay | Admitting: Physical Therapy

## 2021-10-02 ENCOUNTER — Ambulatory Visit (INDEPENDENT_AMBULATORY_CARE_PROVIDER_SITE_OTHER): Payer: 59 | Admitting: Physical Therapy

## 2021-10-02 DIAGNOSIS — R29898 Other symptoms and signs involving the musculoskeletal system: Secondary | ICD-10-CM | POA: Diagnosis not present

## 2021-10-02 DIAGNOSIS — M5459 Other low back pain: Secondary | ICD-10-CM | POA: Diagnosis not present

## 2021-10-02 DIAGNOSIS — M6281 Muscle weakness (generalized): Secondary | ICD-10-CM | POA: Diagnosis not present

## 2021-10-02 NOTE — Therapy (Signed)
OUTPATIENT PHYSICAL THERAPY TREATMENT NOTE   Patient Name: Karen Waller MRN: [id number] DOB:Dec 26, 1955, 66 y.o., female Today's Date: 10/02/2021   END OF SESSION:   PT End of Session - 10/02/21 0935     Visit Number 6    Number of Visits 12    Date for PT Re-Evaluation 10/24/21    Authorization Type [employer] 20% coinsurance    Authorization - Number of Visits 57    PT Start Time 2633    PT Stop Time 1015    PT Time Calculation (min) 41 min    Activity Tolerance Patient tolerated treatment well   vertigo   Behavior During Therapy [facility] for tasks assessed/performed               Past Medical History:  Diagnosis Date   Cardiomyopathy    EF 30-35% diagnosed 02/2008   Central obesity    Gout    left knee   HTN (hypertension)    Pancytopenia ([place])    Past Surgical History:  Procedure Laterality Date   KNEE SURGERY     TONSILLECTOMY     Patient Active Problem List   Diagnosis Date Noted   Iron deficiency anemia 06/22/2019   Pain in left hip 03/24/2018   Bruising, spontaneous 07/06/2017   Left Achilles tendinitis 11/08/2016   Conductive hearing loss, bilateral 08/08/2016   Elevated lipids 12/16/2012   HTN (hypertension) 01/05/2011   CHF (congestive heart failure) ([place]) 10/30/2010   Edema 10/30/2010   Gouty arthropathy 03/17/2008   OVERWEIGHT 03/17/2008   ESSENTIAL HYPERTENSION, BENIGN 03/17/2008   Non-ischemic cardiomyopathy ([place]) 03/17/2008   PALPITATIONS, OCCASIONAL 03/17/2008    THERAPY DIAG:  Other low back pain  Muscle weakness (generalized)  Other symptoms and signs involving the musculoskeletal system  PCP: Carol Ada, MD   REFERRING PROVIDER: Carol Ada, MD   REFERRING DIAG: M54.50 Low Back Pain   Rationale for Evaluation and Treatment Rehabilitation   ONSET DATE: 08/30/21                                                                                                                                                                                         SUBJECTIVE STATEMENT: Back has flared up on Sunday.  Took a muscle relaxer but it didn't help.  Unsure what caused it to flare up.      PERTINENT HISTORY:   myelodysplastic syndrome, cardiomyopathy, gout, HTN, pancytopenia   PAIN:  Are you having pain? Yes: NPRS scale: 0 sitting, up to 7/10 currently Pain location: Lt side low back, no radicular symptoms Pain description: throbbing, pressure Aggravating factors: walking, transitions Relieving factors: sitting, tylenol  PRECAUTIONS: None   WEIGHT BEARING RESTRICTIONS No  PATIENT GOALS improve pain     OBJECTIVE:    PATIENT SURVEYS:  09/12/21: FOTO 36 (predicted 61)   PALPATION: Trigger points noted in Lt QL, lumbar paraspinals   LUMBAR ROM:    Active  A/PROM  eval 09/26/21  Flexion Limited 50%  Limited 25%  Extension Limited 25% Limited 25%  Right lateral flexion WNL   Left lateral flexion WNL   Right rotation WNL   Left rotation WNL (with pain)    (Blank rows = not tested)     LOWER EXTREMITY MMT:  deferred other testing due to difficulty with getting into testing positions   MMT Right eval Left eval  Hip flexion 4/5 3+/5  Hip extension      Hip abduction      Hip adduction      Hip internal rotation      Hip external rotation      Knee flexion      Knee extension      Ankle dorsiflexion      Ankle plantarflexion      Ankle inversion      Ankle eversion       (Blank rows = not tested)     GAIT: 09/12/21 Comments: gait slow and guarded; slow transitional movements as well     TODAY'S TREATMENT  10/02/21 Manual STM with percussive device to Lt glutes, lumbar paraspinals and QL (avoiding area of increased sensitivity) Increased pain with light touch to Lt QL with resolving hematoma noted.    Therex Side lying book openers 10 x 5 sec hold bil Seated lumbar flexion stretch Pball roll outs 15 sec X 5 reps (forward and to Rt for Lt side stretch) NuStep L6 x 8 min  Modalities MHP  to low back during NuStep  Self-Care Discussed current symptoms and PT recommendations at this time.  Given acute onset of symptoms recommend seeing if symptoms begin to improve by next visit and if pain is unchanged or worse would recommend reaching out to referring provider.  Pt in agreement and verbalized understanding.  09/26/21 -Nu step L6 X 7 min UE/LE -Seated lumbar flexion stretch Pball roll outs 5 sec X 10 -Standing lumbar extension X10 -Standing rows with green X 20 bilat -Standing shoulder extensions with green X20 bilat - Standing hip abductions with red X 15 bilat  -Standing hip extensions with red X 15 bilat  -Moist heat at end of session X7 min to lumbar  09/21/21 -Nu step L6 X 6 min UE/LE -Seated lumbar flexion stretch Pball roll outs 5 sec X 10 -Supine single knee to chest 3x20 sec bil -Supine piriformis stretch 3X20 sec bilat -Supine bridges X15 in smaller ROM -Low trunk rotations 5 sec X 5 bilat -Seated ab set with pball isometric shoulder extension with hands on top of ball in her lap 5 sec X10 -Seated ab set with pball isometric hip flexion with hands on top of ball in her lap 5 sec X10 -Standing lumbar extension X10 -Moist heat at end of session X10 min to lumbar    PATIENT EDUCATION:  Education details: Eval: HEP, DN / 09/13/21: BPPV Person educated: Patient Education method: Explanation, Demonstration, and Handouts Education comprehension: verbalized understanding, returned demonstration, and needs further education     HOME EXERCISE PROGRAM: Access Code: [place] URL: [email or web address] Date: 09/12/2021 Prepared by: Faustino Congress   Exercises - Seated Single Knee to Chest  - 1 x daily - 7 x  weekly - 1 sets - 3 reps - 20-30 hold - Seated Flexion Stretch with Swiss Ball  - 1 x daily - 7 x weekly - 1 sets - 3 reps - 20-30 hold - Seated Thoracic Flexion and Rotation with Swiss Ball  - 1 x daily - 7 x weekly - 1 sets - 3 reps - 20-30  hold - Self Release   - 1 x daily - 7 x weekly - 3 sets - 10 reps   Patient Education - Trigger Point Dry Needling   ASSESSMENT:   CLINICAL IMPRESSION: Pt with recent flare up of symptoms, but unable to identify possible cause.  Muscle relaxers were ineffective in helping pain.  Did feel some reduction in symptoms today after NuStep and moist heat, so encouraged light activity (biking, NuStep, pool walking) and heat PRN.   If pain persists, would recommend at least reaching out to referring provider for additional guidance.    OBJECTIVE IMPAIRMENTS decreased mobility, difficulty walking, decreased ROM, decreased strength, dizziness, hypomobility, increased fascial restrictions, increased muscle spasms, postural dysfunction, and pain.    ACTIVITY LIMITATIONS carrying, lifting, bending, sitting, standing, squatting, sleeping, stairs, transfers, bed mobility, and locomotion level   PARTICIPATION LIMITATIONS: meal prep, cleaning, laundry, driving, community activity, and occupation   PERSONAL FACTORS 3+ comorbidities: myelodysplastic syndrome, cardiomyopathy, gout, HTN, pancytopenia, vertigo  are also affecting patient's functional outcome.    REHAB POTENTIAL: Good   CLINICAL DECISION MAKING: Evolving/moderate complexity   EVALUATION COMPLEXITY: Moderate     GOALS: Goals reviewed with patient? Yes   SHORT TERM GOALS: Target date: 10/03/2021   Independent with initial HEP Goal status: MET 09/18/21     LONG TERM GOALS: Target date: 10/24/2021   Independent with final HEP Goal status: ongoing   2.  FOTO score improved to 61 Goal status: ongoing   3.  Lumbar AROM improved to WNL without pain for improved mobility and function Goal status: ongoing   4.  Report pain < 3/10 with standing and walking >1 hour for improved function Goal status: ongoing pain is about 7/10 09/21/21   5.  Report 75% improvement in dizziness for improved function Goal status: MET 09/21/21          PLAN: PT FREQUENCY: 2x/week   PT DURATION: 6 weeks   PLANNED INTERVENTIONS: Therapeutic exercises, Therapeutic activity, Neuromuscular re-education, Balance training, Gait training, Patient/Family education, Self Care, Joint mobilization, Vestibular training, Canalith repositioning, Aquatic Therapy, Dry Needling, Electrical stimulation, Spinal manipulation, Spinal mobilization, Cryotherapy, Moist heat, Taping, Traction, Manual therapy, and Re-evaluation.   PLAN FOR NEXT SESSION:  knee/hip/core strengthening to tolerance, lumbar mobility, see how pain is and treat from there (?estim?)    Laureen Abrahams, PT, DPT 10/02/21 10:33 AM

## 2021-10-05 ENCOUNTER — Encounter: Payer: Self-pay | Admitting: Physical Therapy

## 2021-10-05 ENCOUNTER — Ambulatory Visit (INDEPENDENT_AMBULATORY_CARE_PROVIDER_SITE_OTHER): Payer: 59 | Admitting: Physical Therapy

## 2021-10-05 DIAGNOSIS — M6281 Muscle weakness (generalized): Secondary | ICD-10-CM

## 2021-10-05 DIAGNOSIS — R29898 Other symptoms and signs involving the musculoskeletal system: Secondary | ICD-10-CM | POA: Diagnosis not present

## 2021-10-05 DIAGNOSIS — M5459 Other low back pain: Secondary | ICD-10-CM | POA: Diagnosis not present

## 2021-10-05 NOTE — Therapy (Signed)
OUTPATIENT PHYSICAL THERAPY TREATMENT NOTE   Patient Name: Karen Waller MRN: [id number] DOB:1955/10/18, 66 y.o., female Today's Date: 10/05/2021   END OF SESSION:   PT End of Session - 10/05/21 1017     Visit Number 7    Number of Visits 12    Date for PT Re-Evaluation 10/24/21    Authorization Type [employer] 20% coinsurance    Authorization - Number of Visits 77    PT Start Time 8295    PT Stop Time 1055    PT Time Calculation (min) 40 min    Activity Tolerance Patient tolerated treatment well   vertigo   Behavior During Therapy WFL for tasks assessed/performed               Past Medical History:  Diagnosis Date   Cardiomyopathy    EF 30-35% diagnosed 02/2008   Central obesity    Gout    left knee   HTN (hypertension)    Pancytopenia ([place])    Past Surgical History:  Procedure Laterality Date   KNEE SURGERY     TONSILLECTOMY     Patient Active Problem List   Diagnosis Date Noted   Iron deficiency anemia 06/22/2019   Pain in left hip 03/24/2018   Bruising, spontaneous 07/06/2017   Left Achilles tendinitis 11/08/2016   Conductive hearing loss, bilateral 08/08/2016   Elevated lipids 12/16/2012   HTN (hypertension) 01/05/2011   CHF (congestive heart failure) ([place]) 10/30/2010   Edema 10/30/2010   Gouty arthropathy 03/17/2008   OVERWEIGHT 03/17/2008   ESSENTIAL HYPERTENSION, BENIGN 03/17/2008   Non-ischemic cardiomyopathy ([place]) 03/17/2008   PALPITATIONS, OCCASIONAL 03/17/2008    THERAPY DIAG:  Other low back pain  Muscle weakness (generalized)  Other symptoms and signs involving the musculoskeletal system  PCP: Carol Ada, MD   REFERRING PROVIDER: Carol Ada, MD   REFERRING DIAG: M54.50 Low Back Pain   Rationale for Evaluation and Treatment Rehabilitation   ONSET DATE: 08/30/21                                                                                                                                                                                         SUBJECTIVE STATEMENT: She relays pain is a little better than last time, pain is not constant but still bad with standing from chair, car transfers, prolonged standing.   PERTINENT HISTORY:   myelodysplastic syndrome, cardiomyopathy, gout, HTN, pancytopenia   PAIN:  Are you having pain? Yes: NPRS scale: 0 sitting, up to 7/10 currently Pain location: Lt side low back, no radicular symptoms Pain description: throbbing, pressure Aggravating factors: walking, transitions Relieving factors: sitting, tylenol  PRECAUTIONS: None   WEIGHT BEARING RESTRICTIONS No  PATIENT GOALS improve pain     OBJECTIVE:    PATIENT SURVEYS:  09/12/21: FOTO 36 (predicted 61)   PALPATION: Trigger points noted in Lt QL, lumbar paraspinals   LUMBAR ROM:    Active  A/PROM  eval 09/26/21  Flexion Limited 50%  Limited 25%  Extension Limited 25% Limited 25%  Right lateral flexion WNL   Left lateral flexion WNL   Right rotation WNL   Left rotation WNL (with pain)    (Blank rows = not tested)     LOWER EXTREMITY MMT:  deferred other testing due to difficulty with getting into testing positions   MMT Right eval Left eval  Hip flexion 4/5 3+/5  Hip extension      Hip abduction      Hip adduction      Hip internal rotation      Hip external rotation      Knee flexion      Knee extension      Ankle dorsiflexion      Ankle plantarflexion      Ankle inversion      Ankle eversion       (Blank rows = not tested)     GAIT: 09/12/21 Comments: gait slow and guarded; slow transitional movements as well     TODAY'S TREATMENT  10/05/21 -Nu step L6 X 8 min UE/LE with moist hot pack to lumbar -Seated lumbar flexion stretch Pball roll outs 10 sec X 10 -Standing lumbar extension X10 -Standing rows with green X 15 bilat -Standing shoulder extensions with green X15 bilat - Standing hip abductions  X 15 bilat with UE support on back of chair  -Standing hip extensions  X 15 bilat  with UE support on back of chair -Mini squats X10 with UE support on back of chair  -Modalaties: IFC Estim X 12 min to lumbar (3 on left, 1 on Rt) with moist hot pack at end of session.  10/02/21 Manual STM with percussive device to Lt glutes, lumbar paraspinals and QL (avoiding area of increased sensitivity) Increased pain with light touch to Lt QL with resolving hematoma noted.    Therex Side lying book openers 10 x 5 sec hold bil Seated lumbar flexion stretch Pball roll outs 15 sec X 5 reps (forward and to Rt for Lt side stretch) NuStep L6 x 8 min  Modalities MHP to low back during NuStep  Self-Care Discussed current symptoms and PT recommendations at this time.  Given acute onset of symptoms recommend seeing if symptoms begin to improve by next visit and if pain is unchanged or worse would recommend reaching out to referring provider.  Pt in agreement and verbalized understanding.       PATIENT EDUCATION:  Education details: Eval: HEP, DN / 09/13/21: BPPV Person educated: Patient Education method: Explanation, Demonstration, and Handouts Education comprehension: verbalized understanding, returned demonstration, and needs further education     HOME EXERCISE PROGRAM: Access Code: [place] URL: [email or web address] Date: 09/12/2021 Prepared by: Faustino Congress   Exercises - Seated Single Knee to Chest  - 1 x daily - 7 x weekly - 1 sets - 3 reps - 20-30 hold - Seated Flexion Stretch with Swiss Ball  - 1 x daily - 7 x weekly - 1 sets - 3 reps - 20-30 hold - Seated Thoracic Flexion and Rotation with Swiss Ball  - 1 x daily - 7 x weekly - 1 sets - 3 reps -  20-30 hold - Self Release   - 1 x daily - 7 x weekly - 3 sets - 10 reps   Patient Education - Trigger Point Dry Needling   ASSESSMENT:   CLINICAL IMPRESSION: Pain a little better than last time. She still reports pain and difficulty getting up from chair so I added mini squats in small ROM and in time  will try to progress ROM to hopefully aide her with sit to stand ability.  I also tried Estim today to her low back to see if this helps with her pain levels any.     OBJECTIVE IMPAIRMENTS decreased mobility, difficulty walking, decreased ROM, decreased strength, dizziness, hypomobility, increased fascial restrictions, increased muscle spasms, postural dysfunction, and pain.    ACTIVITY LIMITATIONS carrying, lifting, bending, sitting, standing, squatting, sleeping, stairs, transfers, bed mobility, and locomotion level   PARTICIPATION LIMITATIONS: meal prep, cleaning, laundry, driving, community activity, and occupation   PERSONAL FACTORS 3+ comorbidities: myelodysplastic syndrome, cardiomyopathy, gout, HTN, pancytopenia, vertigo  are also affecting patient's functional outcome.    REHAB POTENTIAL: Good   CLINICAL DECISION MAKING: Evolving/moderate complexity   EVALUATION COMPLEXITY: Moderate     GOALS: Goals reviewed with patient? Yes   SHORT TERM GOALS: Target date: 10/03/2021   Independent with initial HEP Goal status: MET 09/18/21     LONG TERM GOALS: Target date: 10/24/2021   Independent with final HEP Goal status: ongoing   2.  FOTO score improved to 61 Goal status: ongoing   3.  Lumbar AROM improved to WNL without pain for improved mobility and function Goal status: ongoing   4.  Report pain < 3/10 with standing and walking >1 hour for improved function Goal status: ongoing pain is about 7/10 09/21/21   5.  Report 75% improvement in dizziness for improved function Goal status: MET 09/21/21         PLAN: PT FREQUENCY: 2x/week   PT DURATION: 6 weeks   PLANNED INTERVENTIONS: Therapeutic exercises, Therapeutic activity, Neuromuscular re-education, Balance training, Gait training, Patient/Family education, Self Care, Joint mobilization, Vestibular training, Canalith repositioning, Aquatic Therapy, Dry Needling, Electrical stimulation, Spinal manipulation, Spinal  mobilization, Cryotherapy, Moist heat, Taping, Traction, Manual therapy, and Re-evaluation.   PLAN FOR NEXT SESSION:  how was Estim? knee/hip/core strengthening to tolerance, lumbar mobility  Elsie Ra, PT, DPT 10/05/21 10:50 AM

## 2021-10-10 ENCOUNTER — Other Ambulatory Visit: Payer: Self-pay | Admitting: Physician Assistant

## 2021-10-10 ENCOUNTER — Encounter: Payer: 59 | Admitting: Physical Therapy

## 2021-10-10 DIAGNOSIS — N644 Mastodynia: Secondary | ICD-10-CM

## 2021-10-11 ENCOUNTER — Other Ambulatory Visit: Payer: Self-pay | Admitting: Physician Assistant

## 2021-10-11 ENCOUNTER — Ambulatory Visit (INDEPENDENT_AMBULATORY_CARE_PROVIDER_SITE_OTHER): Payer: 59 | Admitting: Physical Therapy

## 2021-10-11 ENCOUNTER — Encounter: Payer: Self-pay | Admitting: Physical Therapy

## 2021-10-11 DIAGNOSIS — R29898 Other symptoms and signs involving the musculoskeletal system: Secondary | ICD-10-CM | POA: Diagnosis not present

## 2021-10-11 DIAGNOSIS — M5459 Other low back pain: Secondary | ICD-10-CM | POA: Diagnosis not present

## 2021-10-11 DIAGNOSIS — Z13820 Encounter for screening for osteoporosis: Secondary | ICD-10-CM

## 2021-10-11 DIAGNOSIS — M6281 Muscle weakness (generalized): Secondary | ICD-10-CM

## 2021-10-11 DIAGNOSIS — E2839 Other primary ovarian failure: Secondary | ICD-10-CM

## 2021-10-11 NOTE — Therapy (Signed)
OUTPATIENT PHYSICAL THERAPY TREATMENT NOTE   Patient Name: Karen Waller MRN: [id number] DOB:1955-04-09, 66 y.o., female Today's Date: 10/11/2021   END OF SESSION:   PT End of Session - 10/11/21 0855     Visit Number 8    Number of Visits 12    Date for PT Re-Evaluation 10/24/21    Authorization Type [employer] 20% coinsurance    Authorization - Number of Visits 28    PT Start Time 0845    PT Stop Time 0930    PT Time Calculation (min) 45 min    Activity Tolerance Patient tolerated treatment well   vertigo   Behavior During Therapy WFL for tasks assessed/performed               Past Medical History:  Diagnosis Date   Cardiomyopathy    EF 30-35% diagnosed 02/2008   Central obesity    Gout    left knee   HTN (hypertension)    Pancytopenia ([place])    Past Surgical History:  Procedure Laterality Date   KNEE SURGERY     TONSILLECTOMY     Patient Active Problem List   Diagnosis Date Noted   Iron deficiency anemia 06/22/2019   Pain in left hip 03/24/2018   Bruising, spontaneous 07/06/2017   Left Achilles tendinitis 11/08/2016   Conductive hearing loss, bilateral 08/08/2016   Elevated lipids 12/16/2012   HTN (hypertension) 01/05/2011   CHF (congestive heart failure) ([place]) 10/30/2010   Edema 10/30/2010   Gouty arthropathy 03/17/2008   OVERWEIGHT 03/17/2008   ESSENTIAL HYPERTENSION, BENIGN 03/17/2008   Non-ischemic cardiomyopathy ([place]) 03/17/2008   PALPITATIONS, OCCASIONAL 03/17/2008    THERAPY DIAG:  Other low back pain  Muscle weakness (generalized)  Other symptoms and signs involving the musculoskeletal system  PCP: Carol Ada, MD   REFERRING PROVIDER: Carol Ada, MD   REFERRING DIAG: M54.50 Low Back Pain   Rationale for Evaluation and Treatment Rehabilitation   ONSET DATE: 08/30/21                                                                                                                                                                                         SUBJECTIVE STATEMENT: She relays pain is good today, thought she would never get to this point!   PERTINENT HISTORY:   myelodysplastic syndrome, cardiomyopathy, gout, HTN, pancytopenia   PAIN:  Are you having pain? Yes: NPRS scale: did not provide number today Pain location: Lt side low back, no radicular symptoms Pain description: throbbing, pressure Aggravating factors: walking, transitions Relieving factors: sitting, tylenol     PRECAUTIONS: None   WEIGHT BEARING RESTRICTIONS No  PATIENT  GOALS improve pain     OBJECTIVE:    PATIENT SURVEYS:  09/12/21: FOTO 36 (predicted 61)   PALPATION: Trigger points noted in Lt QL, lumbar paraspinals   LUMBAR ROM:    Active  A/PROM  eval 09/26/21  Flexion Limited 50%  Limited 25%  Extension Limited 25% Limited 25%  Right lateral flexion WNL   Left lateral flexion WNL   Right rotation WNL   Left rotation WNL (with pain)    (Blank rows = not tested)     LOWER EXTREMITY MMT:  deferred other testing due to difficulty with getting into testing positions   MMT Right eval Left eval Right 10/11/21 Left 10/11/21  Hip flexion 4/5 3+/5 4 4   Hip extension        Hip abduction        Hip adduction        Hip internal rotation        Hip external rotation        Knee flexion        Knee extension        Ankle dorsiflexion        Ankle plantarflexion        Ankle inversion        Ankle eversion         (Blank rows = not tested)     GAIT: 09/12/21 Comments: gait slow and guarded; slow transitional movements as well     TODAY'S TREATMENT  10/11/21 -recumbent bike X 8 min L2 with moist hot pack to lumbar -Standing lumbar extension X10 holding 5 sec -Standing rows with green X 20 bilat -Standing shoulder extensions with green X20 bilat - Standing hip abductions  X 15 bilat with red band and UE support  -Standing hip extensions  X 15 bilat with red band and UE support  -Standing hip extensions  X 15 bilat with  red band and UE support  -Sit to stands no UE support with slow eccentrics X 10 from chair with airex pad in seat.  -Seated LAQ 2# 2X10 bilat  -Modalaties: IFC Estim X 10 min to lumbar (3 on left, 1 on Rt) with moist hot pack at end of session.  10/05/21 -Nu step L6 X 8 min UE/LE with moist hot pack to lumbar -Seated lumbar flexion stretch Pball roll outs 10 sec X 10 -Standing lumbar extension X10 -Standing rows with green X 15 bilat -Standing shoulder extensions with green X15 bilat - Standing hip abductions  X 15 bilat with UE support on back of chair  -Standing hip extensions  X 15 bilat with UE support on back of chair -Mini squats X10 with UE support on back of chair  -Modalaties: IFC Estim X 12 min to lumbar (3 on left, 1 on Rt) with moist hot pack at end of session.  10/02/21 Manual STM with percussive device to Lt glutes, lumbar paraspinals and QL (avoiding area of increased sensitivity) Increased pain with light touch to Lt QL with resolving hematoma noted.    Therex Side lying book openers 10 x 5 sec hold bil Seated lumbar flexion stretch Pball roll outs 15 sec X 5 reps (forward and to Rt for Lt side stretch) NuStep L6 x 8 min  Modalities MHP to low back during NuStep  Self-Care Discussed current symptoms and PT recommendations at this time.  Given acute onset of symptoms recommend seeing if symptoms begin to improve by next visit and if pain is unchanged or worse would  recommend reaching out to referring provider.  Pt in agreement and verbalized understanding.       PATIENT EDUCATION:  Education details: Eval: HEP, DN / 09/13/21: BPPV Person educated: Patient Education method: Explanation, Demonstration, and Handouts Education comprehension: verbalized understanding, returned demonstration, and needs further education     HOME EXERCISE PROGRAM: Access Code: [place] URL: [email or web address] Date: 09/12/2021 Prepared by: Faustino Congress    Exercises - Seated Single Knee to Chest  - 1 x daily - 7 x weekly - 1 sets - 3 reps - 20-30 hold - Seated Flexion Stretch with Swiss Ball  - 1 x daily - 7 x weekly - 1 sets - 3 reps - 20-30 hold - Seated Thoracic Flexion and Rotation with Swiss Ball  - 1 x daily - 7 x weekly - 1 sets - 3 reps - 20-30 hold - Self Release   - 1 x daily - 7 x weekly - 3 sets - 10 reps   Patient Education - Trigger Point Dry Needling   ASSESSMENT:   CLINICAL IMPRESSION: Strength has improved but still with some weakness noted. I did progress her strength program some with good overall activity tolerance. She felt like the E stim treatment really helped last time so this was continued.     OBJECTIVE IMPAIRMENTS decreased mobility, difficulty walking, decreased ROM, decreased strength, dizziness, hypomobility, increased fascial restrictions, increased muscle spasms, postural dysfunction, and pain.    ACTIVITY LIMITATIONS carrying, lifting, bending, sitting, standing, squatting, sleeping, stairs, transfers, bed mobility, and locomotion level   PARTICIPATION LIMITATIONS: meal prep, cleaning, laundry, driving, community activity, and occupation   PERSONAL FACTORS 3+ comorbidities: myelodysplastic syndrome, cardiomyopathy, gout, HTN, pancytopenia, vertigo  are also affecting patient's functional outcome.    REHAB POTENTIAL: Good   CLINICAL DECISION MAKING: Evolving/moderate complexity   EVALUATION COMPLEXITY: Moderate     GOALS: Goals reviewed with patient? Yes   SHORT TERM GOALS: Target date: 10/03/2021   Independent with initial HEP Goal status: MET 09/18/21     LONG TERM GOALS: Target date: 10/24/2021   Independent with final HEP Goal status: ongoing   2.  FOTO score improved to 61 Goal status: ongoing   3.  Lumbar AROM improved to WNL without pain for improved mobility and function Goal status: ongoing   4.  Report pain < 3/10 with standing and walking >1 hour for improved function Goal  status: ongoing pain is about 7/10 09/21/21   5.  Report 75% improvement in dizziness for improved function Goal status: MET 09/21/21         PLAN: PT FREQUENCY: 2x/week   PT DURATION: 6 weeks   PLANNED INTERVENTIONS: Therapeutic exercises, Therapeutic activity, Neuromuscular re-education, Balance training, Gait training, Patient/Family education, Self Care, Joint mobilization, Vestibular training, Canalith repositioning, Aquatic Therapy, Dry Needling, Electrical stimulation, Spinal manipulation, Spinal mobilization, Cryotherapy, Moist heat, Taping, Traction, Manual therapy, and Re-evaluation.   PLAN FOR NEXT SESSION:  knee/hip/core strengthening to tolerance, lumbar mobility. Heat and Estim if desired.   Elsie Ra, PT, DPT 10/11/21 8:56 AM

## 2021-10-16 ENCOUNTER — Ambulatory Visit (INDEPENDENT_AMBULATORY_CARE_PROVIDER_SITE_OTHER): Payer: 59 | Admitting: Physical Therapy

## 2021-10-16 ENCOUNTER — Encounter: Payer: Self-pay | Admitting: Physical Therapy

## 2021-10-16 DIAGNOSIS — R29898 Other symptoms and signs involving the musculoskeletal system: Secondary | ICD-10-CM | POA: Diagnosis not present

## 2021-10-16 DIAGNOSIS — M6281 Muscle weakness (generalized): Secondary | ICD-10-CM | POA: Diagnosis not present

## 2021-10-16 DIAGNOSIS — M5459 Other low back pain: Secondary | ICD-10-CM | POA: Diagnosis not present

## 2021-10-16 NOTE — Therapy (Signed)
OUTPATIENT PHYSICAL THERAPY TREATMENT NOTE   Patient Name: Karen Waller MRN: [id number] DOB:Apr 22, 1955, 66 y.o., female Today's Date: 10/16/2021   END OF SESSION:   PT End of Session - 10/16/21 0932     Visit Number 9    Number of Visits 12    Date for PT Re-Evaluation 10/24/21    Authorization Type [employer] 20% coinsurance    Authorization - Number of Visits 86    PT Start Time 0929    PT Stop Time 1024    PT Time Calculation (min) 55 min    Activity Tolerance Patient tolerated treatment well    Behavior During Therapy [facility] for tasks assessed/performed                Past Medical History:  Diagnosis Date   Cardiomyopathy    EF 30-35% diagnosed 02/2008   Central obesity    Gout    left knee   HTN (hypertension)    Pancytopenia ([place])    Past Surgical History:  Procedure Laterality Date   KNEE SURGERY     TONSILLECTOMY     Patient Active Problem List   Diagnosis Date Noted   Iron deficiency anemia 06/22/2019   Pain in left hip 03/24/2018   Bruising, spontaneous 07/06/2017   Left Achilles tendinitis 11/08/2016   Conductive hearing loss, bilateral 08/08/2016   Elevated lipids 12/16/2012   HTN (hypertension) 01/05/2011   CHF (congestive heart failure) ([place]) 10/30/2010   Edema 10/30/2010   Gouty arthropathy 03/17/2008   OVERWEIGHT 03/17/2008   ESSENTIAL HYPERTENSION, BENIGN 03/17/2008   Non-ischemic cardiomyopathy ([place]) 03/17/2008   PALPITATIONS, OCCASIONAL 03/17/2008    THERAPY DIAG:  Other low back pain  Muscle weakness (generalized)  Other symptoms and signs involving the musculoskeletal system  PCP: Carol Ada, MD   REFERRING PROVIDER: Carol Ada, MD   REFERRING DIAG: M54.50 Low Back Pain   Rationale for Evaluation and Treatment Rehabilitation   ONSET DATE: 08/30/21                                                                                                                                                                                         SUBJECTIVE STATEMENT: Mid back is more uncomfortable today; low back still doing well.   PERTINENT HISTORY:   myelodysplastic syndrome, cardiomyopathy, gout, HTN, pancytopenia   PAIN:  Are you having pain? Yes: NPRS scale: did not provide number today Pain location: Lt side low back, no radicular symptoms Pain description: throbbing, pressure Aggravating factors: walking, transitions Relieving factors: sitting, tylenol     PRECAUTIONS: None   WEIGHT BEARING RESTRICTIONS No  PATIENT GOALS improve pain  OBJECTIVE:    PATIENT SURVEYS:  09/12/21: FOTO 36 (predicted 61)   PALPATION: Trigger points noted in Lt QL, lumbar paraspinals   LUMBAR ROM:    Active  A/PROM  eval 09/26/21  Flexion Limited 50%  Limited 25%  Extension Limited 25% Limited 25%  Right lateral flexion WNL   Left lateral flexion WNL   Right rotation WNL   Left rotation WNL (with pain)    (Blank rows = not tested)     LOWER EXTREMITY MMT:  deferred other testing due to difficulty with getting into testing positions   MMT Right eval Left eval Right 10/11/21 Left 10/11/21  Hip flexion 4/5 3+/5 4 4   Hip extension        Hip abduction        Hip adduction        Hip internal rotation        Hip external rotation        Knee flexion        Knee extension        Ankle dorsiflexion        Ankle plantarflexion        Ankle inversion        Ankle eversion         (Blank rows = not tested)     GAIT: 09/12/21 Comments: gait slow and guarded; slow transitional movements as well     TODAY'S TREATMENT  10/16/21 Therex: Recumbent bike X 8 min L3 with moist hot pack to thoracic spine Standing rows L3 2x10; 5 sec hold Standing shoulder extension L3 2x10 Standing bil shoulder ER with scap retraction L3 2x10 Seated trunk rotation 5x5 sec hold bil, sitting in chair reaching for back of chair Rhomboid stretch in doorway 3 x 20 sec Seated LAQ 4# 2X10 bilat Sit to/from stand from elevated  surface x 10 reps; with overhead reach 2# med ball  Modalaties:  Pre-Mod (2 channels) Estim X 15 min Mid thoracic and lumbar with moist hot pack at end of session.  10/11/21 -recumbent bike X 8 min L2 with moist hot pack to lumbar -Standing lumbar extension X10 holding 5 sec -Standing rows with green X 20 bilat -Standing shoulder extensions with green X20 bilat -Standing hip abductions  X 15 bilat with red band and UE support -Standing hip extensions  X 15 bilat with red band and UE support -Standing hip extensions  X 15 bilat with red band and UE support -Sit to stands no UE support with slow eccentrics X 10 from chair with airex pad in seat. -Seated LAQ 2# 2X10 bilat  -Modalaties: IFC Estim X 10 min to lumbar (3 on left, 1 on Rt) with moist hot pack at end of session.  10/05/21 -Nu step L6 X 8 min UE/LE with moist hot pack to lumbar -Seated lumbar flexion stretch Pball roll outs 10 sec X 10 -Standing lumbar extension X10 -Standing rows with green X 15 bilat -Standing shoulder extensions with green X15 bilat - Standing hip abductions  X 15 bilat with UE support on back of chair  -Standing hip extensions  X 15 bilat with UE support on back of chair -Mini squats X10 with UE support on back of chair  -Modalaties: IFC Estim X 12 min to lumbar (3 on left, 1 on Rt) with moist hot pack at end of session.  10/02/21 Manual STM with percussive device to Lt glutes, lumbar paraspinals and QL (avoiding area of increased sensitivity) Increased pain with light  touch to Lt QL with resolving hematoma noted.    Therex Side lying book openers 10 x 5 sec hold bil Seated lumbar flexion stretch Pball roll outs 15 sec X 5 reps (forward and to Rt for Lt side stretch) NuStep L6 x 8 min  Modalities MHP to low back during NuStep  Self-Care Discussed current symptoms and PT recommendations at this time.  Given acute onset of symptoms recommend seeing if symptoms begin to improve by next visit and if  pain is unchanged or worse would recommend reaching out to referring provider.  Pt in agreement and verbalized understanding.       PATIENT EDUCATION:  Education details: Eval: HEP, DN / 09/13/21: BPPV Person educated: Patient Education method: Explanation, Demonstration, and Handouts Education comprehension: verbalized understanding, returned demonstration, and needs further education     HOME EXERCISE PROGRAM: Access Code: [place] URL: [email or web address] Date: 09/12/2021 Prepared by: Faustino Congress   Exercises - Seated Single Knee to Chest  - 1 x daily - 7 x weekly - 1 sets - 3 reps - 20-30 hold - Seated Flexion Stretch with Swiss Ball  - 1 x daily - 7 x weekly - 1 sets - 3 reps - 20-30 hold - Seated Thoracic Flexion and Rotation with Swiss Ball  - 1 x daily - 7 x weekly - 1 sets - 3 reps - 20-30 hold - Self Release   - 1 x daily - 7 x weekly - 3 sets - 10 reps   Patient Education - Trigger Point Dry Needling   ASSESSMENT:   CLINICAL IMPRESSION: Pt with new onset of mid back pain so addressed today with decreased pain reported at end of session.  Likely due to sleeping position and hopeful it will improve over the next few days.  Will continue to benefit from PT to maximize function.   OBJECTIVE IMPAIRMENTS decreased mobility, difficulty walking, decreased ROM, decreased strength, dizziness, hypomobility, increased fascial restrictions, increased muscle spasms, postural dysfunction, and pain.    ACTIVITY LIMITATIONS carrying, lifting, bending, sitting, standing, squatting, sleeping, stairs, transfers, bed mobility, and locomotion level   PARTICIPATION LIMITATIONS: meal prep, cleaning, laundry, driving, community activity, and occupation   PERSONAL FACTORS 3+ comorbidities: myelodysplastic syndrome, cardiomyopathy, gout, HTN, pancytopenia, vertigo  are also affecting patient's functional outcome.    REHAB POTENTIAL: Good   CLINICAL DECISION MAKING:  Evolving/moderate complexity   EVALUATION COMPLEXITY: Moderate     GOALS: Goals reviewed with patient? Yes   SHORT TERM GOALS: Target date: 10/03/2021   Independent with initial HEP Goal status: MET 09/18/21     LONG TERM GOALS: Target date: 10/24/2021   Independent with final HEP Goal status: ongoing   2.  FOTO score improved to 61 Goal status: ongoing   3.  Lumbar AROM improved to WNL without pain for improved mobility and function Goal status: ongoing   4.  Report pain < 3/10 with standing and walking >1 hour for improved function Goal status: ongoing pain is about 7/10 09/21/21   5.  Report 75% improvement in dizziness for improved function Goal status: MET 09/21/21         PLAN: PT FREQUENCY: 2x/week   PT DURATION: 6 weeks   PLANNED INTERVENTIONS: Therapeutic exercises, Therapeutic activity, Neuromuscular re-education, Balance training, Gait training, Patient/Family education, Self Care, Joint mobilization, Vestibular training, Canalith repositioning, Aquatic Therapy, Dry Needling, Electrical stimulation, Spinal manipulation, Spinal mobilization, Cryotherapy, Moist heat, Taping, Traction, Manual therapy, and Re-evaluation.   PLAN FOR NEXT SESSION:  begin  looking at LTGs (d/c or recert) knee/hip/core strengthening to tolerance, lumbar mobility. Heat and Estim if desired.   Laureen Abrahams, PT, DPT 10/16/21 10:26 AM

## 2021-10-16 NOTE — Therapy (Deleted)
OUTPATIENT PHYSICAL THERAPY TREATMENT NOTE   Patient Name: Karen Waller MRN: [id number] DOB:Jul 20, 1955, 66 y.o., female 89 Date: 10/16/2021   END OF SESSION:       Past Medical History:  Diagnosis Date   Cardiomyopathy    EF 30-35% diagnosed 02/2008   Central obesity    Gout    left knee   HTN (hypertension)    Pancytopenia ([place])    Past Surgical History:  Procedure Laterality Date   KNEE SURGERY     TONSILLECTOMY     Patient Active Problem List   Diagnosis Date Noted   Iron deficiency anemia 06/22/2019   Pain in left hip 03/24/2018   Bruising, spontaneous 07/06/2017   Left Achilles tendinitis 11/08/2016   Conductive hearing loss, bilateral 08/08/2016   Elevated lipids 12/16/2012   HTN (hypertension) 01/05/2011   CHF (congestive heart failure) ([place]) 10/30/2010   Edema 10/30/2010   Gouty arthropathy 03/17/2008   OVERWEIGHT 03/17/2008   ESSENTIAL HYPERTENSION, BENIGN 03/17/2008   Non-ischemic cardiomyopathy ([place]) 03/17/2008   PALPITATIONS, OCCASIONAL 03/17/2008    THERAPY DIAG:  No diagnosis found.  PCP: Carol Ada, MD   REFERRING PROVIDER: Carol Ada, MD   REFERRING DIAG: M54.50 Low Back Pain   Rationale for Evaluation and Treatment Rehabilitation   ONSET DATE: 08/30/21                                                                                                                                                                                        SUBJECTIVE STATEMENT: *** She relays pain is good today, thought she would never get to this point!   PERTINENT HISTORY:   myelodysplastic syndrome, cardiomyopathy, gout, HTN, pancytopenia   PAIN:  Are you having pain? Yes: NPRS scale: did not provide number today Pain location: Lt side low back, no radicular symptoms Pain description: throbbing, pressure Aggravating factors: walking, transitions Relieving factors: sitting, tylenol     PRECAUTIONS: None   WEIGHT BEARING  RESTRICTIONS No  PATIENT GOALS improve pain     OBJECTIVE:    PATIENT SURVEYS:  09/12/21: FOTO 36 (predicted 61)   PALPATION: Trigger points noted in Lt QL, lumbar paraspinals   LUMBAR ROM:    Active  A/PROM  eval 09/26/21  Flexion Limited 50%  Limited 25%  Extension Limited 25% Limited 25%  Right lateral flexion WNL   Left lateral flexion WNL   Right rotation WNL   Left rotation WNL (with pain)    (Blank rows = not tested)     LOWER EXTREMITY MMT:  deferred other testing due to difficulty with getting into testing positions   MMT Right eval Left eval  Right 10/11/21 Left 10/11/21  Hip flexion 4/5 3+/_0 Hip extension        Hip abduction        Hip adduction        Hip internal rotation        Hip external rotation        Knee flexion        Knee extension        Ankle dorsiflexion        Ankle plantarflexion        Ankle inversion        Ankle eversion         (Blank rows = not tested)     GAIT: 09/12/21 Comments: gait slow and guarded; slow transitional movements as well     TODAY'S TREATMENT  10/16/21 ***  10/11/21 -recumbent bike X 8 min L2 with moist hot pack to lumbar -Standing lumbar extension X10 holding 5 sec -Standing rows with green X 20 bilat -Standing shoulder extensions with green X20 bilat - Standing hip abductions  X 15 bilat with red band and UE support  -Standing hip extensions  X 15 bilat with red band and UE support  -Standing hip extensions  X 15 bilat with red band and UE support  -Sit to stands no UE support with slow eccentrics X 10 from chair with airex pad in seat.  -Seated LAQ 2# 2X10 bilat  -Modalaties: IFC Estim X 10 min to lumbar (3 on left, 1 on Rt) with moist hot pack at end of session.  10/05/21 -Nu step L6 X 8 min UE/LE with moist hot pack to lumbar -Seated lumbar flexion stretch Pball roll outs 10 sec X 10 -Standing lumbar extension X10 -Standing rows with green X 15 bilat -Standing shoulder extensions with green  X15 bilat - Standing hip abductions  X 15 bilat with UE support on back of chair  -Standing hip extensions  X 15 bilat with UE support on back of chair -Mini squats X10 with UE support on back of chair  -Modalaties: IFC Estim X 12 min to lumbar (3 on left, 1 on Rt) with moist hot pack at end of session.  10/02/21 Manual STM with percussive device to Lt glutes, lumbar paraspinals and QL (avoiding area of increased sensitivity) Increased pain with light touch to Lt QL with resolving hematoma noted.    Therex Side lying book openers 10 x 5 sec hold bil Seated lumbar flexion stretch Pball roll outs 15 sec X 5 reps (forward and to Rt for Lt side stretch) NuStep L6 x 8 min  Modalities MHP to low back during NuStep  Self-Care Discussed current symptoms and PT recommendations at this time.  Given acute onset of symptoms recommend seeing if symptoms begin to improve by next visit and if pain is unchanged or worse would recommend reaching out to referring provider.  Pt in agreement and verbalized understanding.       PATIENT EDUCATION:  Education details: Eval: HEP, DN / 09/13/21: BPPV Person educated: Patient Education method: Explanation, Demonstration, and Handouts Education comprehension: verbalized understanding, returned demonstration, and needs further education     HOME EXERCISE PROGRAM: Access Code: [place] URL: [email or web address] Date: 09/12/2021 Prepared by: Faustino Congress   Exercises - Seated Single Knee to Chest  - 1 x daily - 7 x weekly - 1 sets - 3 reps - 20-30 hold - Seated Flexion Stretch with Swiss Ball  - 1 x daily - 7 x weekly -  1 sets - 3 reps - 20-30 hold - Seated Thoracic Flexion and Rotation with Swiss Ball  - 1 x daily - 7 x weekly - 1 sets - 3 reps - 20-30 hold - Self Release   - 1 x daily - 7 x weekly - 3 sets - 10 reps   Patient Education - Trigger Point Dry Needling   ASSESSMENT:   CLINICAL IMPRESSION: *** Strength has  improved but still with some weakness noted. I did progress her strength program some with good overall activity tolerance. She felt like the E stim treatment really helped last time so this was continued.     OBJECTIVE IMPAIRMENTS decreased mobility, difficulty walking, decreased ROM, decreased strength, dizziness, hypomobility, increased fascial restrictions, increased muscle spasms, postural dysfunction, and pain.    ACTIVITY LIMITATIONS carrying, lifting, bending, sitting, standing, squatting, sleeping, stairs, transfers, bed mobility, and locomotion level   PARTICIPATION LIMITATIONS: meal prep, cleaning, laundry, driving, community activity, and occupation   PERSONAL FACTORS 3+ comorbidities: myelodysplastic syndrome, cardiomyopathy, gout, HTN, pancytopenia, vertigo  are also affecting patient's functional outcome.    REHAB POTENTIAL: Good   CLINICAL DECISION MAKING: Evolving/moderate complexity   EVALUATION COMPLEXITY: Moderate     GOALS: Goals reviewed with patient? Yes   SHORT TERM GOALS: Target date: 10/03/2021   Independent with initial HEP Goal status: MET 09/18/21     LONG TERM GOALS: Target date: 10/24/2021   Independent with final HEP Goal status: ongoing   2.  FOTO score improved to 61 Goal status: ongoing   3.  Lumbar AROM improved to WNL without pain for improved mobility and function Goal status: ongoing   4.  Report pain < 3/10 with standing and walking >1 hour for improved function Goal status: ongoing pain is about 7/10 09/21/21   5.  Report 75% improvement in dizziness for improved function Goal status: MET 09/21/21         PLAN: PT FREQUENCY: 2x/week   PT DURATION: 6 weeks   PLANNED INTERVENTIONS: Therapeutic exercises, Therapeutic activity, Neuromuscular re-education, Balance training, Gait training, Patient/Family education, Self Care, Joint mobilization, Vestibular training, Canalith repositioning, Aquatic Therapy, Dry Needling, Electrical  stimulation, Spinal manipulation, Spinal mobilization, Cryotherapy, Moist heat, Taping, Traction, Manual therapy, and Re-evaluation.   PLAN FOR NEXT SESSION:  *** knee/hip/core strengthening to tolerance, lumbar mobility. Heat and Estim if desired.   Elsie Ra, PT, DPT 10/16/21 7:21 AM

## 2021-10-19 ENCOUNTER — Ambulatory Visit (INDEPENDENT_AMBULATORY_CARE_PROVIDER_SITE_OTHER): Payer: 59 | Admitting: Physical Therapy

## 2021-10-19 ENCOUNTER — Encounter: Payer: Self-pay | Admitting: Physical Therapy

## 2021-10-19 DIAGNOSIS — M5459 Other low back pain: Secondary | ICD-10-CM | POA: Diagnosis not present

## 2021-10-19 DIAGNOSIS — R29898 Other symptoms and signs involving the musculoskeletal system: Secondary | ICD-10-CM | POA: Diagnosis not present

## 2021-10-19 DIAGNOSIS — M6281 Muscle weakness (generalized): Secondary | ICD-10-CM | POA: Diagnosis not present

## 2021-10-19 NOTE — Therapy (Addendum)
OUTPATIENT PHYSICAL THERAPY TREATMENT NOTE DISCHARGE SUMMARY   Patient Name: Karen Waller MRN: [id number] DOB:17-Feb-1956, 66 y.o., female Today's Date: 10/19/2021   END OF SESSION:   PT End of Session - 10/19/21 1054     Visit Number 10    Number of Visits 12    Date for PT Re-Evaluation 10/24/21    Authorization Type [employer] 20% coinsurance    Authorization - Number of Visits 32    PT Start Time 1053    PT Stop Time 1133    PT Time Calculation (min) 40 min    Activity Tolerance Patient tolerated treatment well    Behavior During Therapy WFL for tasks assessed/performed                 Past Medical History:  Diagnosis Date   Cardiomyopathy    EF 30-35% diagnosed 02/2008   Central obesity    Gout    left knee   HTN (hypertension)    Pancytopenia ([place])    Past Surgical History:  Procedure Laterality Date   KNEE SURGERY     TONSILLECTOMY     Patient Active Problem List   Diagnosis Date Noted   Iron deficiency anemia 06/22/2019   Pain in left hip 03/24/2018   Bruising, spontaneous 07/06/2017   Left Achilles tendinitis 11/08/2016   Conductive hearing loss, bilateral 08/08/2016   Elevated lipids 12/16/2012   HTN (hypertension) 01/05/2011   CHF (congestive heart failure) ([place]) 10/30/2010   Edema 10/30/2010   Gouty arthropathy 03/17/2008   OVERWEIGHT 03/17/2008   ESSENTIAL HYPERTENSION, BENIGN 03/17/2008   Non-ischemic cardiomyopathy ([place]) 03/17/2008   PALPITATIONS, OCCASIONAL 03/17/2008    THERAPY DIAG:  Other low back pain  Muscle weakness (generalized)  Other symptoms and signs involving the musculoskeletal system  PCP: Carol Ada, MD   REFERRING PROVIDER: Carol Ada, MD   REFERRING DIAG: M54.50 Low Back Pain   Rationale for Evaluation and Treatment Rehabilitation   ONSET DATE: 08/30/21                                                                                                                                                                                         SUBJECTIVE STATEMENT: Has some pain with walking and standing for "too long" which she reports is about 15 min.  When standing/walking for up to 1 hour can get up to 6/10, but resolves quickly with rest   PERTINENT HISTORY:  myelodysplastic syndrome, cardiomyopathy, gout, HTN, pancytopenia   PAIN:  Are you having pain? Yes: NPRS scale: 0 Pain location: Lt side low back, no radicular symptoms Pain description: throbbing, pressure Aggravating factors: walking, transitions  Relieving factors: sitting, tylenol     PRECAUTIONS: None   WEIGHT BEARING RESTRICTIONS No  PATIENT GOALS improve pain     OBJECTIVE:    PATIENT SURVEYS:  09/12/21: FOTO 36 (predicted 61) 10/19/21: FOTO 57   PALPATION: Trigger points noted in Lt QL, lumbar paraspinals   LUMBAR ROM:    Active  A/PROM  eval 09/26/21 10/19/21  Flexion Limited 50%  Limited 25% Limited  25%  Extension Limited 25% Limited 25% WNL  Right lateral flexion WNL  WNL  Left lateral flexion WNL  WNL  Right rotation WNL  WNL  Left rotation WNL (with pain)  WNL   (Blank rows = not tested)     LOWER EXTREMITY MMT:  deferred other testing due to difficulty with getting into testing positions   MMT Right eval Left eval Right 10/11/21 Left 10/11/21  Hip flexion 4/5 3+/_0 Hip extension        Hip abduction        Hip adduction        Hip internal rotation        Hip external rotation        Knee flexion        Knee extension        Ankle dorsiflexion        Ankle plantarflexion        Ankle inversion        Ankle eversion         (Blank rows = not tested)     GAIT: 09/12/21 Comments: gait slow and guarded; slow transitional movements as well     TODAY'S TREATMENT  10/19/21 Therex: NuStep L6 x 8 min Lumbar ROM measurements as noted above Bridge x10 reps Hooklying single limb clamshell x 10 reps bil; L4 band Squats x 10 reps Dead lifts with 10# KB x 10 reps  10/16/21 Therex: Recumbent  bike X 8 min L3 with moist hot pack to thoracic spine Standing rows L3 2x10; 5 sec hold Standing shoulder extension L3 2x10 Standing bil shoulder ER with scap retraction L3 2x10 Seated trunk rotation 5x5 sec hold bil, sitting in chair reaching for back of chair Rhomboid stretch in doorway 3 x 20 sec Seated LAQ 4# 2X10 bilat Sit to/from stand from elevated surface x 10 reps; with overhead reach 2# med ball  Modalaties:  Pre-Mod (2 channels) Estim X 15 min Mid thoracic and lumbar with moist hot pack at end of session.  10/11/21 -recumbent bike X 8 min L2 with moist hot pack to lumbar -Standing lumbar extension X10 holding 5 sec -Standing rows with green X 20 bilat -Standing shoulder extensions with green X20 bilat -Standing hip abductions  X 15 bilat with red band and UE support -Standing hip extensions  X 15 bilat with red band and UE support -Standing hip extensions  X 15 bilat with red band and UE support -Sit to stands no UE support with slow eccentrics X 10 from chair with airex pad in seat. -Seated LAQ 2# 2X10 bilat  -Modalaties: IFC Estim X 10 min to lumbar (3 on left, 1 on Rt) with moist hot pack at end of session.  10/05/21 -Nu step L6 X 8 min UE/LE with moist hot pack to lumbar -Seated lumbar flexion stretch Pball roll outs 10 sec X 10 -Standing lumbar extension X10 -Standing rows with green X 15 bilat -Standing shoulder extensions with green X15 bilat - Standing hip abductions  X 15 bilat with UE  support on back of chair  -Standing hip extensions  X 15 bilat with UE support on back of chair -Mini squats X10 with UE support on back of chair  -Modalaties: IFC Estim X 12 min to lumbar (3 on left, 1 on Rt) with moist hot pack at end of session.  10/02/21 Manual STM with percussive device to Lt glutes, lumbar paraspinals and QL (avoiding area of increased sensitivity) Increased pain with light touch to Lt QL with resolving hematoma noted.    Therex Side lying book openers  10 x 5 sec hold bil Seated lumbar flexion stretch Pball roll outs 15 sec X 5 reps (forward and to Rt for Lt side stretch) NuStep L6 x 8 min  Modalities MHP to low back during NuStep  Self-Care Discussed current symptoms and PT recommendations at this time.  Given acute onset of symptoms recommend seeing if symptoms begin to improve by next visit and if pain is unchanged or worse would recommend reaching out to referring provider.  Pt in agreement and verbalized understanding.       PATIENT EDUCATION:  Education details: Eval: HEP, DN / 09/13/21: BPPV Person educated: Patient Education method: Explanation, Demonstration, and Handouts Education comprehension: verbalized understanding, returned demonstration, and needs further education     HOME EXERCISE PROGRAM: Access Code: [place] URL: [email or web address] Date: 10/19/2021 Prepared by: Faustino Congress  Exercises - Seated Single Knee to Chest  - 1 x daily - 7 x weekly - 1 sets - 3 reps - 20-30 hold - Seated Flexion Stretch with Swiss Ball  - 1 x daily - 7 x weekly - 1 sets - 3 reps - 20-30 hold - Seated Thoracic Flexion and Rotation with Swiss Ball  - 1 x daily - 7 x weekly - 1 sets - 3 reps - 20-30 hold - Self Release   - 1 x daily - 7 x weekly - 3 sets - 10 reps - Seated Figure 4 Piriformis Stretch  - 1 x daily - 7 x weekly - 1 sets - 3 reps - 30 sec hold - Supine Bridge  - 2 x daily - 7 x weekly - 1 sets - 10 reps - 5 sec hold - Hooklying Single Leg Bent Knee Fallouts with Resistance  - 1 x daily - 7 x weekly - 3 sets - 10 reps - Squat with Counter Support  - 1 x daily - 7 x weekly - 3 sets - 10 reps - Half Deadlift with Kettlebell  - 1 x daily - 7 x weekly - 3 sets - 10 reps  Patient Education - Trigger Point Dry Needling ASSESSMENT:   CLINICAL IMPRESSION: Pt has demonstrated progress towards all LTGs and met or partially met some of them.  Overall she has and HEP and understanding of activity  progression to improved mobility and return to prior level of function.  At this time will hold PT x 30 days, pt will call if she needs to return.  Will d/c PT if pt doesn't return.   OBJECTIVE IMPAIRMENTS decreased mobility, difficulty walking, decreased ROM, decreased strength, dizziness, hypomobility, increased fascial restrictions, increased muscle spasms, postural dysfunction, and pain.    ACTIVITY LIMITATIONS carrying, lifting, bending, sitting, standing, squatting, sleeping, stairs, transfers, bed mobility, and locomotion level   PARTICIPATION LIMITATIONS: meal prep, cleaning, laundry, driving, community activity, and occupation   PERSONAL FACTORS 3+ comorbidities: myelodysplastic syndrome, cardiomyopathy, gout, HTN, pancytopenia, vertigo  are also affecting patient's functional outcome.    REHAB POTENTIAL: Good  CLINICAL DECISION MAKING: Evolving/moderate complexity   EVALUATION COMPLEXITY: Moderate     GOALS: Goals reviewed with patient? Yes   SHORT TERM GOALS: Target date: 10/03/2021   Independent with initial HEP Goal status: MET 09/18/21     LONG TERM GOALS: Target date: 10/24/2021   Independent with final HEP Goal status: met 10/19/21   2.  FOTO score improved to 61 Goal status: Improved, not to goal 10/19/21   3.  Lumbar AROM improved to WNL without pain for improved mobility and function Goal status: MET except flexion but no pain 10/19/21   4.  Report pain < 3/10 with standing and walking >1 hour for improved function Goal status: partially met 10/19/21   5.  Report 75% improvement in dizziness for improved function Goal status: MET 09/21/21         PLAN: PT FREQUENCY: 2x/week   PT DURATION: 6 weeks   PLANNED INTERVENTIONS: Therapeutic exercises, Therapeutic activity, Neuromuscular re-education, Balance training, Gait training, Patient/Family education, Self Care, Joint mobilization, Vestibular training, Canalith repositioning, Aquatic Therapy, Dry Needling,  Electrical stimulation, Spinal manipulation, Spinal mobilization, Cryotherapy, Moist heat, Taping, Traction, Manual therapy, and Re-evaluation.   PLAN FOR NEXT SESSION:  hold PT x 30 days, d/c or recert if pt returns   Laureen Abrahams, PT, DPT 10/19/21 11:39 AM    PHYSICAL THERAPY DISCHARGE SUMMARY  Visits from Start of Care: 10  Current functional level related to goals / functional outcomes: See above   Remaining deficits: See above   Education / Equipment: HEP   Patient agrees to discharge. Patient goals were partially met. Patient is being discharged due to being pleased with the current functional level.  Laureen Abrahams, PT, DPT 12/18/21 9:05 AM  [facility] [address] [place], [place], [address] Phone: [phone]   Fax:  [phone]

## 2021-10-24 ENCOUNTER — Other Ambulatory Visit: Payer: Self-pay | Admitting: Hematology

## 2021-10-29 ENCOUNTER — Encounter: Payer: 59 | Admitting: Physical Therapy

## 2021-10-30 ENCOUNTER — Encounter: Payer: 59 | Admitting: Physical Therapy

## 2021-11-09 ENCOUNTER — Other Ambulatory Visit: Payer: Self-pay

## 2021-11-09 DIAGNOSIS — D469 Myelodysplastic syndrome, unspecified: Secondary | ICD-10-CM

## 2021-11-12 ENCOUNTER — Inpatient Hospital Stay: Payer: 59 | Attending: Hematology | Admitting: Hematology

## 2021-11-12 ENCOUNTER — Inpatient Hospital Stay: Payer: 59

## 2021-11-12 ENCOUNTER — Other Ambulatory Visit: Payer: Self-pay

## 2021-11-12 VITALS — BP 137/70 | HR 79 | Temp 97.9°F | Resp 16 | Ht 65.0 in | Wt 195.1 lb

## 2021-11-12 DIAGNOSIS — I10 Essential (primary) hypertension: Secondary | ICD-10-CM | POA: Insufficient documentation

## 2021-11-12 DIAGNOSIS — Z87891 Personal history of nicotine dependence: Secondary | ICD-10-CM | POA: Diagnosis not present

## 2021-11-12 DIAGNOSIS — D469 Myelodysplastic syndrome, unspecified: Secondary | ICD-10-CM | POA: Diagnosis present

## 2021-11-12 DIAGNOSIS — D696 Thrombocytopenia, unspecified: Secondary | ICD-10-CM | POA: Insufficient documentation

## 2021-11-12 DIAGNOSIS — D61818 Other pancytopenia: Secondary | ICD-10-CM | POA: Diagnosis not present

## 2021-11-12 LAB — CMP (CANCER CENTER ONLY)
ALT: 12 U/L (ref 0–44)
AST: 15 U/L (ref 15–41)
Albumin: 4.4 g/dL (ref 3.5–5.0)
Alkaline Phosphatase: 69 U/L (ref 38–126)
Anion gap: 7 (ref 5–15)
BUN: 19 mg/dL (ref 8–23)
CO2: 25 mmol/L (ref 22–32)
Calcium: 9.4 mg/dL (ref 8.9–10.3)
Chloride: 108 mmol/L (ref 98–111)
Creatinine: 0.97 mg/dL (ref 0.44–1.00)
GFR, Estimated: >60 mL/min (ref >60)
Glucose, Bld: 146 mg/dL — ABNORMAL HIGH (ref 70–99)
Potassium: 3.9 mmol/L (ref 3.5–5.1)
Sodium: 140 mmol/L (ref 135–145)
Total Bilirubin: 0.6 mg/dL (ref 0.3–1.2)
Total Protein: 7.3 g/dL (ref 6.5–8.1)

## 2021-11-12 LAB — FERRITIN: Ferritin: 44 ng/mL (ref 11–307)

## 2021-11-12 LAB — LACTATE DEHYDROGENASE: LDH: 212 U/L — ABNORMAL HIGH (ref 98–192)

## 2021-11-12 LAB — CBC WITH DIFFERENTIAL (CANCER CENTER ONLY)
Abs Immature Granulocytes: 0 10*3/uL (ref 0.00–0.07)
Basophils Absolute: 0 10*3/uL (ref 0.0–0.1)
Basophils Relative: 0 %
Eosinophils Absolute: 0 10*3/uL (ref 0.0–0.5)
Eosinophils Relative: 1 %
HCT: 30.3 % — ABNORMAL LOW (ref 36.0–46.0)
Hemoglobin: 9.7 g/dL — ABNORMAL LOW (ref 12.0–15.0)
Immature Granulocytes: 0 %
Lymphocytes Relative: 59 %
Lymphs Abs: 1.3 10*3/uL (ref 0.7–4.0)
MCH: 31.1 pg (ref 26.0–34.0)
MCHC: 32 g/dL (ref 30.0–36.0)
MCV: 97.1 fL (ref 80.0–100.0)
Monocytes Absolute: 0.3 10*3/uL (ref 0.1–1.0)
Monocytes Relative: 12 %
Neutro Abs: 0.6 10*3/uL — ABNORMAL LOW (ref 1.7–7.7)
Neutrophils Relative %: 28 %
Platelet Count: 80 10*3/uL — ABNORMAL LOW (ref 150–400)
RBC: 3.12 MIL/uL — ABNORMAL LOW (ref 3.87–5.11)
RDW: 18.7 % — ABNORMAL HIGH (ref 11.5–15.5)
WBC Count: 2.2 10*3/uL — ABNORMAL LOW (ref 4.0–10.5)
nRBC: 1.8 % — ABNORMAL HIGH (ref 0.0–0.2)

## 2021-11-12 LAB — VITAMIN B12: Vitamin B-12: 5600 pg/mL — ABNORMAL HIGH (ref 180–914)

## 2021-11-16 ENCOUNTER — Ambulatory Visit
Admission: RE | Admit: 2021-11-16 | Discharge: 2021-11-16 | Disposition: A | Discharge status: Home / Self Care | Payer: 59 | Source: Ambulatory Visit | Attending: Physician Assistant | Admitting: Physician Assistant

## 2021-11-16 DIAGNOSIS — N644 Mastodynia: Secondary | ICD-10-CM

## 2021-11-19 ENCOUNTER — Encounter: Payer: Self-pay | Admitting: Hematology

## 2021-11-19 NOTE — Progress Notes (Addendum)
HEMATOLOGY/ONCOLOGY CLINIC NOTE  Date of Service: 11/19/2021  Patient Care Team: Carol Ada, MD as PCP - General (Family Medicine) Josue Hector, MD as PCP - Cardiology (Cardiology)  CHIEF COMPLAINTS/PURPOSE OF CONSULTATION:  Follow-up for continued evaluation and management of MDS with MF and thrombocytopenia.  HISTORY OF PRESENTING ILLNESS:  Please see previous note for details on initial presentation  Interval History:  Karen Waller is a 66 y.o. female here for continued evaluation and management of MDS and thrombocytopenia evolving slowly from at least 2019. She reports She is doing well.  She reports back pain and recent ED visit on 08/31/2021 with a fever of 101.   She notes tickle in the back of her throat. We discussed that while thrush is not visible that she can start an Nystatin mouthwash to help treat it.  No urinary symptoms. No diarrhea. No other new or acute focal symptoms.  MEDICAL HISTORY:  Past Medical History:  Diagnosis Date   Cardiomyopathy    EF 30-35% diagnosed 02/2008   Central obesity    Gout    left knee   HTN (hypertension)    Pancytopenia (HCC)     SURGICAL HISTORY: Past Surgical History:  Procedure Laterality Date   KNEE SURGERY     TONSILLECTOMY      SOCIAL HISTORY: Social History   Socioeconomic History   Marital status: Married    Spouse name: Not on file   Number of children: 2   Years of education: Not on file   Highest education level: Not on file  Occupational History   Occupation: [employer]  Tobacco Use   Smoking status: Former    Types: Cigarettes    Quit date: 02/19/1984    Years since quitting: 37.7   Smokeless tobacco: Never  Vaping Use   Vaping Use: Never used  Substance and Sexual Activity   Alcohol use: Not Currently   Drug use: Never   Sexual activity: Not on file  Other Topics Concern   Not on file  Social History Narrative   Not on file   Social Determinants of Health   Financial  Resource Strain: Not on file  Food Insecurity: Not on file  Transportation Needs: Not on file  Physical Activity: Not on file  Stress: Not on file  Social Connections: Not on file  Intimate Partner Violence: Not on file    FAMILY HISTORY: Family History  Problem Relation Age of Onset   Hypertension Mother    Breast cancer Neg Hx     ALLERGIES:  is allergic to sulfa antibiotics and codeine.  MEDICATIONS:  Current Outpatient Medications  Medication Sig Dispense Refill   acetaminophen (TYLENOL) 500 MG tablet Take 500 mg by mouth every 6 (six) hours as needed for mild pain.     allopurinol (ZYLOPRIM) 100 MG tablet TAKE 1 TABLET(100 MG) BY MOUTH DAILY 90 tablet 1   amLODipine (NORVASC) 10 MG tablet TAKE 1 TABLET(10 MG) BY MOUTH DAILY (Patient taking differently: Take 10 mg by mouth daily.) 90 tablet 3   B Complex Vitamins (B COMPLEX 100 PO) Take 1 tablet by mouth daily.     candesartan (ATACAND) 16 MG tablet TAKE 1 TABLET BY MOUTH DAILY (Patient taking differently: Take 16 mg by mouth daily.) 90 tablet 3   carvedilol (COREG) 6.25 MG tablet Take 1 tablet (6.25 mg total) by mouth 2 (two) times daily with a meal. 180 tablet 3   furosemide (LASIX) 20 MG tablet Take 0.5  tablets (10 mg total) by mouth daily. 45 tablet 3   hydrALAZINE (APRESOLINE) 25 MG tablet Take 1 tablet (25 mg total) by mouth 2 (two) times daily. 180 tablet 3   Multiple Vitamin (MULTIVITAMIN) capsule Take 1 capsule by mouth daily.     nystatin (MYCOSTATIN) 100000 UNIT/ML suspension Take 5 mLs (500,000 Units total) by mouth 4 (four) times daily. 200 mL 0   tiZANidine (ZANAFLEX) 4 MG tablet Take 1 tablet (4 mg total) by mouth every 6 (six) hours as needed for muscle spasms. 30 tablet 0   traMADol (ULTRAM) 50 MG tablet Take 1-2 tablets (50-100 mg total) by mouth daily as needed. (Patient not taking: Reported on 09/12/2021) 20 tablet 0   No current facility-administered medications for this visit.    REVIEW OF SYSTEMS:   .10  Point review of Systems was done is negative except as noted above.  PHYSICAL EXAMINATION:  Vitals:   11/12/21 1241  BP: 137/70  Pulse: 79  Resp: 16  Temp: 97.9 F (36.6 C)  SpO2: 97%    Filed Weights   11/12/21 1241  Weight: 195 lb 1.6 oz (88.5 kg)    .Body mass index is 32.47 kg/m.   NAD GENERAL:alert, in no acute distress and comfortable SKIN: no acute rashes, no significant lesions EYES: conjunctiva are pink and non-injected, sclera anicteric OROPHARYNX: MMM, no exudates, no oropharyngeal erythema or ulceration NECK: supple, no JVD LYMPH:  no palpable lymphadenopathy in the cervical, axillary or inguinal regions LUNGS: clear to auscultation b/l with normal respiratory effort HEART: regular rate & rhythm ABDOMEN:  normoactive bowel sounds , non tender, not distended. Extremity: no pedal edema PSYCH: alert & oriented x 3 with fluent speech NEURO: no focal motor/sensory deficits  Exam performed in chair.  LABORATORY DATA:  I have reviewed the data as listed  .  important suggestion  View Detailed Reports    important suggestion  View Condensed Results Report    Contains abnormal data CBC and Differential Order: [id number]  suggestion  Information displayed in this report may not trend or trigger automated decision support.    Ref Range & Units 2 wk ago  WBC 4.4 - 11.0 x 10*3/uL 3.3 Low    RBC 4.10 - 5.10 x 10*6/uL 3.54 Low    Hemoglobin 12.3 - 15.3 G/DL 10.8 Low    Hematocrit 35.9 - 44.6 % 33.0 Low    MCV 80.0 - 96.0 FL 93.0   MCH 27.5 - 33.2 PG 30.4   MCHC 33.0 - 37.0 G/DL 32.7 Low    RDW 12.3 - 17.0 % 19.8 High    Platelets 150 - 450 X 10*3/uL 92 Low    MPV 6.8 - 10.2 FL 10.6 High    Neutrophil % % 28   Lymphocyte % % 59   Monocyte % % 10   Eosinophil % % 3   Basophil % % 0   Seg Absolute 1.8 - 7.8 x 10*3/uL 0.9 Low    Lymphocyte Absolute 1.0 - 4.8 x 10*3/uL 2.0   Monocyte Absolute 0.0 - 0.8 x 10*3/uL 0.3   Eosinophil Absolute 0.0 - 0.5 x 10*3/uL  0.1   Basophil Absolute 0.0 - 0.2 x 10*3/uL 0.0   NRBC Manual <=0 / 100 WBC 1 High    Ovalocytes  1+   Polychromasia  1+   Schistocytes  1+   Tear Drop  1+   Platelet Morphology  1   Comment: Platelet Morphology Appear Normal  WBC MORPHOLOGY  1   Comment: Manual Diff Performed  Specimen Collected: 03/12/21 10:07 Last Resulted: 03/12/21 11:02  Received From: [place]  Result Received: 03/13/21 14:07   View Encounter     Received Information  Contains abnormal data Comprehensive Metabolic Panel Order: [id number]  suggestion  Information displayed in this report may not trend or trigger automated decision support.    Ref Range & Units 2 wk ago  Sodium 135 - 146 MMOL/L 140   Potassium 3.5 - 5.3 MMOL/L 3.4 Low    Comment: NO VISIBLE HEMOLYSIS  Chloride 98 - 110 MMOL/L 106   CO2 23 - 30 MMOL/L 26   BUN 8 - 24 MG/DL 19   Glucose 70 - 99 MG/DL 133 High    Comment: Patients taking eltrombopag at doses >/= 100 mg daily may show falsely elevated values of 10% or greater.  Creatinine 0.50 - 1.50 MG/DL 0.93   Calcium 8.5 - 10.5 MG/DL 9.2   Total Protein 6.0 - 8.3 G/DL 6.7   Comment: Patients taking eltrombopag at doses >/= 100 mg daily may show falsely elevated values of 10% or greater.  Albumin  3.5 - 5.0 G/DL 3.8   Total Bilirubin 0.1 - 1.2 MG/DL 0.6   Comment: Patients taking eltrombopag at doses >/= 100 mg daily may show falsely elevated values of 10% or greater.  Alkaline Phosphatase 34 - 104 IU/L or U/L 69   AST (SGOT) 5 - 40 IU/L or U/L 10   ALT (SGPT) 5 - 50 IU/L or U/L 14   Anion Gap 4 - 14 MMOL/L 8   Est. GFR >=60 ML/MIN/1.73 M*2 69   Comment: GFR estimated by CKD-EPI equations(NKF 2021).   "Recommend confirmation of Cr-based eGFR by using Cys-based eGFR and other filtration markers (if applicable) in complex cases and clinical decision-making, as needed."  Specimen Collected: 03/12/21 10:07 Last Resulted: 03/12/21 10:35  Received From: [place]  Result Received: 03/13/21 14:07   View Encounter     Received Information Magnesium Order: [id number]  suggestion  Information displayed in this report may not trend or trigger automated decision support.    Ref Range & Units 2 wk ago  Magnesium 1.8 - 2.4 MG/DL 1.9   Comment: Patients taking eltrombopag at doses >/= 100 mg daily may show falsely elevated values of 10% or greater.  Specimen Collected: 03/12/21 10:07 Last Resulted: 03/12/21 10:35  Received From: [place]  Result Received: 03/13/21 14:07   View Encounter     Received Information    Latest Ref Rng & Units 11/12/2021   12:36 PM 09/10/2021   11:50 AM 08/31/2021   11:51 PM  CBC  WBC 4.0 - 10.5 K/uL 2.2  3.5  3.1   Hemoglobin 12.0 - 15.0 g/dL 9.7  10.7  10.6   Hematocrit 36.0 - 46.0 % 30.3  33.1  33.6   Platelets 150 - 400 K/uL 80  100  66     .    Latest Ref Rng & Units 11/12/2021   12:36 PM 09/10/2021   11:50 AM 08/31/2021   11:51 PM  CMP  Glucose 70 - 99 mg/dL 146  163  133   BUN 8 - 23 mg/dL 19  18  20    Creatinine 0.44 - 1.00 mg/dL 0.97  1.03  0.91   Sodium 135 - 145 mmol/L 140  141  141   Potassium 3.5 - 5.1 mmol/L 3.9  3.3  3.8  Chloride 98 - 111 mmol/L 108  104  111   CO2 22 - 32 mmol/L 25  30  22    Calcium 8.9 - 10.3 mg/dL 9.4  9.2  9.2   Total Protein 6.5 - 8.1 g/dL 7.3  6.7  7.7   Total Bilirubin 0.3 - 1.2 mg/dL 0.6  0.5  0.6   Alkaline Phos 38 - 126 U/L 69  76  74   AST 15 - 41 U/L 15  12  18    ALT 0 - 44 U/L 12  20  16     Component     Latest Ref Rng & Units 06/17/2017  Coagulation Factor VIII     57 - 163 % 128  Ristocetin Co-factor, Plasma     50 - 200 % 117  Von Willebrand Antigen, Plasma     50 - 200 % 126  Prothrombin Time     11.4 - 15.2 seconds 12.9  INR      0.98  PFA Interpretation        Collagen / Epinephrine     0 - 193 seconds 192  Fibrinogen     210 - 475 mg/dL 291  APTT     24 - 36 seconds 31  LDH     125 - 245 U/L 230     11/26/2018 BM Flow Pathology Report    11/26/2018 BM Bx Report    Surgical Pathology  CASE: [id number]  PATIENT: Karen Waller  Bone Marrow Report    Clinical History: pancytopenia, paraproteinemia, right iliac bone      DIAGNOSIS:   BONE MARROW, ASPIRATE, CLOT, CORE:  -Hypercellular bone marrow for age with trilineage hematopoiesis and  fibrosis.  See comment   PERIPHERAL BLOOD:  -Pancytopenia   COMMENT:   The aspirate, touch imprints, and clot sections are suboptimal which  hinders cytomorphologic evaluation.  The core biopsy is optimal and  shows a hypercellular bone marrow (90%) with a mixture of myeloid cell  lines.  This is associated with reticulin fibrosis.  A significant  CD34-positive blast population is not seen.  The myeloid changes are not  considered specific, and hence, it is unclear whether the findings  represent a primary myeloid neoplasm or represent secondary changes.  Correlation with cytogenetic studies is recommended.  There is no  evidence of lymphoproliferative process or diagnostic plasma cell  neoplasm.    11/26/2018 BM Cytogenetics:    RADIOGRAPHIC STUDIES: I have personally reviewed the radiological images as listed and agreed with the findings in the report. MM DIAG BREAST TOMO UNI LEFT  Result Date: 11/16/2021 CLINICAL DATA:  Patient presents with a focal area of tenderness in the medial left breast. EXAM: DIGITAL DIAGNOSTIC UNILATERAL LEFT MAMMOGRAM WITH TOMOSYNTHESIS; ULTRASOUND LEFT BREAST LIMITED TECHNIQUE: Left digital diagnostic mammography and breast tomosynthesis was performed.; Targeted ultrasound examination of the left breast was performed. COMPARISON:  Previous exam(s). ACR Breast Density Category b: There are scattered areas of fibroglandular density. FINDINGS: There are no masses, areas of architectural distortion, areas of significant asymmetry or suspicious calcifications. On physical exam, no mass is palpated in  the medial left breast. Patient has focal tenderness corresponds to an underlying costal cartilage. Targeted ultrasound is performed, showing normal tissue in the medial left breast, upper inner quadrant, in the area of focal tenderness. No mass or suspicious lesion. IMPRESSION: 1. No evidence of breast malignancy. 2. Patient's tenderness appears to correspond to a costal cartilage and may be due to costochondritis. RECOMMENDATION: Annual  screening mammography. Last screening study performed on 04/26/2021. I have discussed the findings and recommendations with the patient. If applicable, a reminder letter will be sent to the patient regarding the next appointment. BI-RADS CATEGORY  1: Negative. Electronically Signed   By: Lajean Manes M.D.   On: 11/16/2021 13:41  US BREAST LTD UNI LEFT INC AXILLA  Result Date: 11/16/2021 CLINICAL DATA:  Patient presents with a focal area of tenderness in the medial left breast. EXAM: DIGITAL DIAGNOSTIC UNILATERAL LEFT MAMMOGRAM WITH TOMOSYNTHESIS; ULTRASOUND LEFT BREAST LIMITED TECHNIQUE: Left digital diagnostic mammography and breast tomosynthesis was performed.; Targeted ultrasound examination of the left breast was performed. COMPARISON:  Previous exam(s). ACR Breast Density Category b: There are scattered areas of fibroglandular density. FINDINGS: There are no masses, areas of architectural distortion, areas of significant asymmetry or suspicious calcifications. On physical exam, no mass is palpated in the medial left breast. Patient has focal tenderness corresponds to an underlying costal cartilage. Targeted ultrasound is performed, showing normal tissue in the medial left breast, upper inner quadrant, in the area of focal tenderness. No mass or suspicious lesion. IMPRESSION: 1. No evidence of breast malignancy. 2. Patient's tenderness appears to correspond to a costal cartilage and may be due to costochondritis. RECOMMENDATION: Annual screening mammography. Last screening  study performed on 04/26/2021. I have discussed the findings and recommendations with the patient. If applicable, a reminder letter will be sent to the patient regarding the next appointment. BI-RADS CATEGORY  1: Negative. Electronically Signed   By: Lajean Manes M.D.   On: 11/16/2021 13:41    ASSESSMENT & PLAN:   66 y.o. female with  1. Minor Spontaneous?/undetected trauma causing bruising No ecchymosis with bruising  Clotting parameters normal from 06/17/17 with normal PT, aPTT and VWD panel and platelet function testing 01/08/18 PT and aPTT, fibrinogen WNL  2. Thrombocytopenia Due to MDS/MPN Plan Labs done today were discussed in detail with the patient. CBC shows leukopenia with WBC count of 2.2 anemia with a hemoglobin of 9.7 and platelets of 80k CMP stable LDH slightly elevated at 212 Patient continues to follow with Dr. Nadara Mustard at [facility] to determine the right time of treatment. We discussed the possible pros and cons of considering Vidaza treatment.  3. MDS/MPN with fibrosis -- causing Pancytopenia --  -Previous bone marrow biopsy was done in October 2020 showing possible MDS/MPN overlap versus MDS with fibrosis.  No increase in blasts cytogenetics with deletion 20 q. NGS with only CBL mutation.  Oct 2020 BM Bx - hypercellular bone marrow with some reticulin fibrosis. 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 Patient notes some mild bruisability but no significant hematomas or other bleeding issues. No infection issues No new fatigue  Patient continues to have fluctuating cytopenias. She continues to follow with Dr. Nadara Mustard at [facility] to determine need/timing of transplantation. At this point Dr. Nadara Mustard has not recommended using Vidaza as a bridge to transplantation. If the patient chooses not to pursue transplantation and  her cytopenias worsen might need to consider hypomethylating agents.  Follow-up Phone visit with Dr Irene Limbo and labs in 6 weeks RTC with Dr Irene Limbo with labs in 12 weeks   The total time spent in the appointment was 25 minutes*.  All of the patient's questions were answered with apparent satisfaction. The patient knows to call the clinic with any problems, questions or  concerns.   Sullivan Lone MD MS AAHIVMS [facility] Hematology/Oncology Physician [facility]  .*Total Encounter Time as defined by the Centers for Medicare and Medicaid Services includes, in addition to the face-to-face time of a patient visit (documented in the note above) non-face-to-face time: obtaining and reviewing outside history, ordering and reviewing medications, tests or procedures, care coordination (communications with other health care professionals or caregivers) and documentation in the medical record.

## 2021-11-20 ENCOUNTER — Other Ambulatory Visit: Payer: Self-pay | Admitting: Family Medicine

## 2021-11-20 ENCOUNTER — Ambulatory Visit
Admission: RE | Admit: 2021-11-20 | Discharge: 2021-11-20 | Disposition: A | Discharge status: Home / Self Care | Payer: 59 | Source: Ambulatory Visit | Attending: Physician Assistant | Admitting: Physician Assistant

## 2021-11-20 ENCOUNTER — Ambulatory Visit
Admission: RE | Admit: 2021-11-20 | Discharge: 2021-11-20 | Disposition: A | Discharge status: Home / Self Care | Payer: 59 | Source: Ambulatory Visit | Attending: Family Medicine | Admitting: Family Medicine

## 2021-11-20 DIAGNOSIS — R52 Pain, unspecified: Secondary | ICD-10-CM

## 2021-11-20 DIAGNOSIS — Z13820 Encounter for screening for osteoporosis: Secondary | ICD-10-CM

## 2021-11-20 DIAGNOSIS — E2839 Other primary ovarian failure: Secondary | ICD-10-CM

## 2021-11-21 DIAGNOSIS — E669 Obesity, unspecified: Secondary | ICD-10-CM | POA: Diagnosis not present

## 2021-11-22 ENCOUNTER — Other Ambulatory Visit: Payer: Self-pay | Admitting: Hematology

## 2021-11-22 ENCOUNTER — Telehealth: Payer: Self-pay

## 2021-11-22 NOTE — Telephone Encounter (Signed)
I called and talked with patient about referral. She stated she did not need this at this time. She stated her PCP sent her to sports medicine facility and she actually attended that appointment yesterday.

## 2021-11-22 NOTE — Telephone Encounter (Signed)
-----   Message from Magnus Sinning, MD sent at 11/21/2021  1:33 PM EDT ----- [name], this referral says for "personal e stim machine"?  You may want to see what they want, maybe they wanted PT referral for Est[place].  I actually recommend people get them off [facility] now.$30   ----- Message ----- From: Laurann Montana, RT Sent: 11/21/2021  11:43 AM EDT To: Magnus Sinning, MD; Lorine Bears, NP   ----- Message ----- From: [place] Sent: 11/21/2021   8:51 AM EDT To: Laurann Montana, RT  Referral for Dr. Ernestina Patches

## 2021-12-03 ENCOUNTER — Telehealth (HOSPITAL_COMMUNITY): Payer: Self-pay | Admitting: Emergency Medicine

## 2021-12-03 NOTE — Telephone Encounter (Signed)
Reaching out to patient to offer assistance regarding upcoming cardiac imaging study; pt verbalizes understanding of appt date/time, parking situation and where to check in, pre-test NPO status and medications ordered, and verified current allergies; name and call back number provided for further questions should they arise Karen Bond RN Navigator Cardiac Imaging [facility] [phone] office [phone] cell  Arrival 12[address] [facility] main entrace Denies iv issues Holding carvedilol No food 3 h No caffeine 12 h

## 2021-12-04 ENCOUNTER — Encounter (HOSPITAL_COMMUNITY)
Admission: RE | Admit: 2021-12-04 | Discharge: 2021-12-04 | Disposition: A | Discharge status: Home / Self Care | Payer: 59 | Source: Ambulatory Visit | Attending: Cardiovascular Disease | Admitting: Cardiovascular Disease

## 2021-12-04 DIAGNOSIS — R072 Precordial pain: Secondary | ICD-10-CM | POA: Diagnosis present

## 2021-12-04 MED ORDER — RUBIDIUM RB82 GENERATOR (RUBYFILL)
22.7000 | PACK | Freq: Once | INTRAVENOUS | Status: AC
  Administered 2021-12-04: 22.7 via INTRAVENOUS

## 2021-12-04 MED ORDER — REGADENOSON 0.4 MG/5ML IV SOLN
INTRAVENOUS | Status: AC
  Administered 2021-12-04: 0.4 mg via INTRAVENOUS
  Filled 2021-12-04: qty 5

## 2021-12-04 MED ORDER — REGADENOSON 0.4 MG/5ML IV SOLN: 0.4000 mg | Freq: Once | INTRAVENOUS | Status: AC

## 2021-12-05 ENCOUNTER — Telehealth: Payer: Self-pay

## 2021-12-05 DIAGNOSIS — E782 Mixed hyperlipidemia: Secondary | ICD-10-CM

## 2021-12-05 DIAGNOSIS — I2510 Atherosclerotic heart disease of native coronary artery without angina pectoris: Secondary | ICD-10-CM

## 2021-12-05 LAB — NM PET CT CARDIAC PERFUSION MULTI W/ABSOLUTE BLOODFLOW
LV dias vol: 127 mL (ref 46–106)
LV sys vol: 58 mL
MBFR: 2.21
Nuc Rest EF: 49 %
Nuc Stress EF: 54 %
Peak HR: 112 {beats}/min
Rest HR: 69 {beats}/min
Rest MBF: 0.89 ml/g/min
Rest Nuclear Isotope Dose: 22.7 mCi
ST Depression (mm): 0 mm
Stress MBF: 1.97 ml/g/min
Stress Nuclear Isotope Dose: 22.7 mCi
TID: 1.12

## 2021-12-05 NOTE — Telephone Encounter (Signed)
The patient has been notified of the result and verbalized understanding.  All questions (if any) were answered. Karen Barter, RN 12/05/2021 5:39 PM   Will place order for lab work. Patient has lab appointment tomorrow.

## 2021-12-05 NOTE — Telephone Encounter (Signed)
-----   Message from Josue Hector, MD sent at 12/05/2021  4:34 PM EDT ----- Blood flow , perfusion normal as is stress EF had calcium in coronary arteries Needs lipid and liver likely start on statin if LDL > 70

## 2021-12-06 ENCOUNTER — Ambulatory Visit: Payer: 59 | Attending: Internal Medicine

## 2021-12-06 DIAGNOSIS — E782 Mixed hyperlipidemia: Secondary | ICD-10-CM

## 2021-12-06 DIAGNOSIS — I2510 Atherosclerotic heart disease of native coronary artery without angina pectoris: Secondary | ICD-10-CM

## 2021-12-06 LAB — LIPID PANEL
Chol/HDL Ratio: 3.9 ratio (ref 0.0–4.4)
Cholesterol, Total: 154 mg/dL (ref 100–199)
HDL: 39 mg/dL — ABNORMAL LOW (ref >39)
LDL Chol Calc (NIH): 93 mg/dL (ref 0–99)
Triglycerides: 124 mg/dL (ref 0–149)
VLDL Cholesterol Cal: 22 mg/dL (ref 5–40)

## 2021-12-06 LAB — HEPATIC FUNCTION PANEL
ALT: 10 IU/L (ref 0–32)
AST: 14 IU/L (ref 0–40)
Albumin: 4.4 g/dL (ref 3.9–4.9)
Alkaline Phosphatase: 83 IU/L (ref 44–121)
Bilirubin Total: 0.7 mg/dL (ref 0.0–1.2)
Bilirubin, Direct: 0.19 mg/dL (ref 0.00–0.40)
Total Protein: 6.7 g/dL (ref 6.0–8.5)

## 2021-12-07 ENCOUNTER — Telehealth: Payer: Self-pay

## 2021-12-07 DIAGNOSIS — E782 Mixed hyperlipidemia: Secondary | ICD-10-CM

## 2021-12-07 DIAGNOSIS — I2510 Atherosclerotic heart disease of native coronary artery without angina pectoris: Secondary | ICD-10-CM

## 2021-12-07 MED ORDER — ROSUVASTATIN CALCIUM 5 MG PO TABS: 5.0000 mg | ORAL_TABLET | Freq: Every evening | ORAL | 3 refills | Status: DC

## 2021-12-07 NOTE — Telephone Encounter (Signed)
The patient has been notified of the result and verbalized understanding.  All questions (if any) were answered. [place], RN 12/07/2021 4:02 PM    Patient stated she has already tried simvastatin in the past and she will call if she has any issue with Crestor. Will have patient come on 03/11/21 for repeat lab work.

## 2021-12-07 NOTE — Telephone Encounter (Signed)
-----   Message from Josue Hector, MD sent at 12/07/2021  8:20 AM EDT ----- She has severe coronary calcium on PET/CT target LDL <70 start lipitor 5 mg daily f/u labs in 3 months

## 2021-12-20 DIAGNOSIS — M25552 Pain in left hip: Secondary | ICD-10-CM | POA: Diagnosis not present

## 2021-12-20 DIAGNOSIS — M545 Low back pain, unspecified: Secondary | ICD-10-CM | POA: Diagnosis not present

## 2021-12-24 DIAGNOSIS — Z882 Allergy status to sulfonamides status: Secondary | ICD-10-CM | POA: Diagnosis not present

## 2021-12-24 DIAGNOSIS — D469 Myelodysplastic syndrome, unspecified: Secondary | ICD-10-CM | POA: Diagnosis not present

## 2021-12-24 DIAGNOSIS — Z888 Allergy status to other drugs, medicaments and biological substances status: Secondary | ICD-10-CM | POA: Diagnosis not present

## 2021-12-24 DIAGNOSIS — Z885 Allergy status to narcotic agent status: Secondary | ICD-10-CM | POA: Diagnosis not present

## 2021-12-24 DIAGNOSIS — M2550 Pain in unspecified joint: Secondary | ICD-10-CM | POA: Diagnosis not present

## 2021-12-24 DIAGNOSIS — M1991 Primary osteoarthritis, unspecified site: Secondary | ICD-10-CM | POA: Diagnosis not present

## 2021-12-24 DIAGNOSIS — Z01818 Encounter for other preprocedural examination: Secondary | ICD-10-CM | POA: Diagnosis not present

## 2022-01-04 ENCOUNTER — Encounter: Payer: Self-pay | Admitting: Hematology

## 2022-01-04 ENCOUNTER — Ambulatory Visit
Admission: EM | Admit: 2022-01-04 | Discharge: 2022-01-04 | Disposition: A | Discharge status: Home / Self Care | Payer: No Typology Code available for payment source | Attending: Emergency Medicine | Admitting: Emergency Medicine

## 2022-01-04 DIAGNOSIS — K120 Recurrent oral aphthae: Secondary | ICD-10-CM | POA: Diagnosis not present

## 2022-01-04 MED ORDER — LIDOCAINE VISCOUS HCL 2 % MT SOLN: 15.0000 mL | OROMUCOSAL | 0 refills | Status: DC | PRN

## 2022-01-04 NOTE — ED Provider Notes (Signed)
[facility]    CSN: [id number] Arrival date & time: 01/04/22  1109    HISTORY   Chief Complaint  Patient presents with   Mouth Lesions   HPI Karen Waller is a pleasant, 66 y.o. female who presents to urgent care today. Patient complains of soreness on the left side of her tongue for the past 2 days, states the lesions are so close to her throat sometimes it hurts when she swallows, states it is very uncomfortable to eat anything.  Patient states she is been taking Tylenol without relief.  Patient has a history of myelo proliferative disorder, states her neutrophils have been very low recently.  Patient denies fever, body aches, chills.  Patient denies known sick contacts, history of similar ulcers in the past.  The history is provided by the patient.   Past Medical History:  Diagnosis Date   Cardiomyopathy    EF 30-35% diagnosed 02/2008   Central obesity    Gout    left knee   HTN (hypertension)    Pancytopenia ([place])    Patient Active Problem List   Diagnosis Date Noted   Iron deficiency anemia 06/22/2019   Pain in left hip 03/24/2018   Bruising, spontaneous 07/06/2017   Left Achilles tendinitis 11/08/2016   Conductive hearing loss, bilateral 08/08/2016   Elevated lipids 12/16/2012   HTN (hypertension) 01/05/2011   CHF (congestive heart failure) ([place]) 10/30/2010   Edema 10/30/2010   Gouty arthropathy 03/17/2008   OVERWEIGHT 03/17/2008   ESSENTIAL HYPERTENSION, BENIGN 03/17/2008   Non-ischemic cardiomyopathy ([place]) 03/17/2008   PALPITATIONS, OCCASIONAL 03/17/2008   Past Surgical History:  Procedure Laterality Date   KNEE SURGERY     TONSILLECTOMY     OB History   No obstetric history on file.    Home Medications    Prior to Admission medications   Medication Sig Start Date End Date Taking? Authorizing Provider  acetaminophen (TYLENOL) 500 MG tablet Take 500 mg by mouth every 6 (six) hours as needed for mild pain.    [provider]   allopurinol (ZYLOPRIM) 100 MG tablet TAKE 1 TABLET(100 MG) BY MOUTH DAILY 11/22/21   Brunetta Genera, MD  amLODipine (NORVASC) 10 MG tablet TAKE 1 TABLET(10 MG) BY MOUTH DAILY Patient taking differently: Take 10 mg by mouth daily. 04/24/21   Elgie Collard, PA-C  B Complex Vitamins (B COMPLEX 100 PO) Take 1 tablet by mouth daily.    [provider]  candesartan (ATACAND) 16 MG tablet TAKE 1 TABLET BY MOUTH DAILY Patient taking differently: Take 16 mg by mouth daily. 07/05/21   Josue Hector, MD  carvedilol (COREG) 6.25 MG tablet Take 1 tablet (6.25 mg total) by mouth 2 (two) times daily with a meal. 04/24/21   Elgie Collard, PA-C  furosemide (LASIX) 20 MG tablet Take 0.5 tablets (10 mg total) by mouth daily. 04/24/21   Elgie Collard, PA-C  hydrALAZINE (APRESOLINE) 25 MG tablet Take 1 tablet (25 mg total) by mouth 2 (two) times daily. 04/24/21   Elgie Collard, PA-C  Multiple Vitamin (MULTIVITAMIN) capsule Take 1 capsule by mouth daily.    [provider]  nystatin (MYCOSTATIN) 100000 UNIT/ML suspension Take 5 mLs (500,000 Units total) by mouth 4 (four) times daily. 09/17/21   Brunetta Genera, MD  rosuvastatin (CRESTOR) 5 MG tablet Take 1 tablet (5 mg total) by mouth at bedtime. 12/07/21   Josue Hector, MD  tiZANidine (ZANAFLEX) 4 MG tablet Take 1  tablet (4 mg total) by mouth every 6 (six) hours as needed for muscle spasms. 09/01/21   Molpus, John, MD  traMADol (ULTRAM) 50 MG tablet Take 1-2 tablets (50-100 mg total) by mouth daily as needed. Patient not taking: Reported on 09/12/2021 03/23/21   Leandrew Koyanagi, MD    Family History Family History  Problem Relation Age of Onset   Hypertension Mother    Breast cancer Neg Hx    Social History Social History   Tobacco Use   Smoking status: Former    Types: Cigarettes    Quit date: 02/19/1984    Years since quitting: 37.9   Smokeless tobacco: Never  Vaping Use   Vaping Use: Never used  Substance Use Topics   Alcohol  use: Not Currently   Drug use: Never   Allergies   Sulfa antibiotics, Codeine, and Simvastatin  Review of Systems Review of Systems Pertinent findings revealed after performing a 14 point review of systems has been noted in the history of present illness.  Physical Exam Triage Vital Signs ED Triage Vitals  Enc Vitals Group     BP 12/15/20 0827 (!) 147/82     Pulse Rate 12/15/20 0827 72     Resp 12/15/20 0827 18     Temp 12/15/20 0827 98.3 F (36.8 C)     Temp Source 12/15/20 0827 Oral     SpO2 12/15/20 0827 98 %     Weight --      Height --      Head Circumference --      Peak Flow --      Pain Score 12/15/20 0826 5     Pain Loc --      Pain Edu? --      Excl. in [place]? --   No data found.  Updated Vital Signs BP 130/80 (BP Location: Right Arm)   Pulse 72   Temp 98 F (36.7 C) (Oral)   Resp 16   SpO2 96%   Physical Exam HENT:     Head: Normocephalic and atraumatic.     Jaw: There is normal jaw occlusion.     Salivary Glands: Right salivary gland is not diffusely enlarged or tender. Left salivary gland is not diffusely enlarged or tender.     Right Ear: Hearing, tympanic membrane, ear canal and external ear normal.     Left Ear: Hearing, tympanic membrane, ear canal and external ear normal.     Nose: Nose normal.     Mouth/Throat:     Lips: Pink. No lesions.     Mouth: Mucous membranes are moist. No injury, lacerations, oral lesions or angioedema.     Dentition: Normal dentition.     Tongue: Lesions present. Tongue does not deviate from midline.     Palate: No mass and lesions.     Pharynx: Oropharynx is clear. Uvula midline. No pharyngeal swelling, oropharyngeal exudate, posterior oropharyngeal erythema or uvula swelling.     Tonsils: No tonsillar exudate. 0 on the right. 0 on the left.   Neck:     Thyroid: No thyroid mass or thyroid tenderness.     Trachea: Trachea and phonation normal.  Musculoskeletal:     Cervical back: Full passive range of motion without  pain, normal range of motion and neck supple.  Lymphadenopathy:     Cervical: No cervical adenopathy.     Visual Acuity Right Eye Distance:   Left Eye Distance:   Bilateral Distance:    Right Eye  Near:   Left Eye Near:    Bilateral Near:     UC Couse / Diagnostics / Procedures:     Radiology No results found.  Procedures Procedures (including critical care time) EKG  Pending results:  Labs Reviewed - No data to display  Medications Ordered in UC: Medications - No data to display  UC Diagnoses / Final Clinical Impressions(s)   I have reviewed the triage vital signs and the nursing notes.  Pertinent labs & imaging results that were available during my care of the patient were reviewed by me and considered in my medical decision making (see chart for details).    Final diagnoses:  Aphthous ulcer of tongue   Patient advised that she is likely acquired abscess ulcers that are self-limiting and require no treatment.  Patient provided with viscous lidocaine to swish and spit for pain.  ED Prescriptions     Medication Sig Dispense Auth. Provider   lidocaine (XYLOCAINE) 2 % solution Use as directed 15 mLs in the mouth or throat every 3 (three) hours as needed for mouth pain (Sore throat). 300 mL Lynden Oxford Scales, PA-C      PDMP not reviewed this encounter.  Pending results:  Labs Reviewed - No data to display  Discharge Instructions:   Discharge Instructions      I have sent a prescription for lidocaine solution to your pharmacy that you can swish every 3 hours as needed for tongue pain.  Ulcerations of the tongue such as the ones that you have at this time are self-limited and benign, they typically resolve within a week or two.  No specific medical intervention is needed.  I do agree that it would be a good idea for you to advise your oncologist of this issue to see if they would like to see you sooner for repeat evaluation of your white blood cell  count.  Thank you for visiting urgent care today.      Disposition Upon Discharge:  Condition: stable for discharge home  Patient presented with an acute illness with associated systemic symptoms and significant discomfort requiring urgent management. In my opinion, this is a condition that a prudent lay person (someone who possesses an average knowledge of health and medicine) may potentially expect to result in complications if not addressed urgently such as respiratory distress, impairment of bodily function or dysfunction of bodily organs.   Routine symptom specific, illness specific and/or disease specific instructions were discussed with the patient and/or caregiver at length.   As such, the patient has been evaluated and assessed, work-up was performed and treatment was provided in alignment with urgent care protocols and evidence based medicine.  Patient/parent/caregiver has been advised that the patient may require follow up for further testing and treatment if the symptoms continue in spite of treatment, as clinically indicated and appropriate.  Patient/parent/caregiver has been advised to return to the [facility] or PCP if no better; to PCP or the Emergency Department if new signs and symptoms develop, or if the current signs or symptoms continue to change or worsen for further workup, evaluation and treatment as clinically indicated and appropriate  The patient will follow up with their current PCP if and as advised. If the patient does not currently have a PCP we will assist them in obtaining one.   The patient may need specialty follow up if the symptoms continue, in spite of conservative treatment and management, for further workup, evaluation, consultation and treatment as clinically indicated and appropriate.  Patient/parent/caregiver verbalized understanding and agreement of plan as discussed.  All questions were addressed during visit.  Please see discharge instructions below for  further details of plan.  This office note has been dictated using Museum/gallery curator.  Unfortunately, this method of dictation can sometimes lead to typographical or grammatical errors.  I apologize for your inconvenience in advance if this occurs.  Please do not hesitate to reach out to me if clarification is needed.      Lynden Oxford Scales, PA-C 01/04/22 1325

## 2022-01-04 NOTE — ED Triage Notes (Signed)
The pt c/o soreness underneath the left side of her tongue.  Started: Wednesday   Home interventions: TYLENOL

## 2022-01-04 NOTE — Discharge Instructions (Signed)
I have sent a prescription for lidocaine solution to your pharmacy that you can swish every 3 hours as needed for tongue pain.  Ulcerations of the tongue such as the ones that you have at this time are self-limited and benign, they typically resolve within a week or two.  No specific medical intervention is needed.  I do agree that it would be a good idea for you to advise your oncologist of this issue to see if they would like to see you sooner for repeat evaluation of your white blood cell count.  Thank you for visiting urgent care today.

## 2022-01-17 DIAGNOSIS — M25552 Pain in left hip: Secondary | ICD-10-CM | POA: Diagnosis not present

## 2022-01-17 DIAGNOSIS — M898X1 Other specified disorders of bone, shoulder: Secondary | ICD-10-CM | POA: Diagnosis not present

## 2022-01-25 ENCOUNTER — Encounter: Payer: Self-pay | Admitting: Hematology

## 2022-02-13 ENCOUNTER — Other Ambulatory Visit: Payer: Self-pay

## 2022-02-13 ENCOUNTER — Inpatient Hospital Stay: Payer: No Typology Code available for payment source | Attending: Hematology

## 2022-02-13 ENCOUNTER — Inpatient Hospital Stay (HOSPITAL_BASED_OUTPATIENT_CLINIC_OR_DEPARTMENT_OTHER): Payer: No Typology Code available for payment source | Admitting: Hematology

## 2022-02-13 VITALS — BP 126/57 | HR 82 | Temp 97.8°F | Resp 18 | Ht 65.0 in | Wt 194.1 lb

## 2022-02-13 DIAGNOSIS — D61818 Other pancytopenia: Secondary | ICD-10-CM | POA: Diagnosis not present

## 2022-02-13 DIAGNOSIS — Z87891 Personal history of nicotine dependence: Secondary | ICD-10-CM | POA: Diagnosis not present

## 2022-02-13 DIAGNOSIS — D469 Myelodysplastic syndrome, unspecified: Secondary | ICD-10-CM | POA: Insufficient documentation

## 2022-02-13 DIAGNOSIS — I10 Essential (primary) hypertension: Secondary | ICD-10-CM | POA: Insufficient documentation

## 2022-02-13 LAB — CMP (CANCER CENTER ONLY)
ALT: 12 U/L (ref 0–44)
AST: 15 U/L (ref 15–41)
Albumin: 4.1 g/dL (ref 3.5–5.0)
Alkaline Phosphatase: 71 U/L (ref 38–126)
Anion gap: 5 (ref 5–15)
BUN: 17 mg/dL (ref 8–23)
CO2: 27 mmol/L (ref 22–32)
Calcium: 9.5 mg/dL (ref 8.9–10.3)
Chloride: 109 mmol/L (ref 98–111)
Creatinine: 1.06 mg/dL — ABNORMAL HIGH (ref 0.44–1.00)
GFR, Estimated: 58 mL/min — ABNORMAL LOW (ref >60)
Glucose, Bld: 163 mg/dL — ABNORMAL HIGH (ref 70–99)
Potassium: 4.2 mmol/L (ref 3.5–5.1)
Sodium: 141 mmol/L (ref 135–145)
Total Bilirubin: 0.7 mg/dL (ref 0.3–1.2)
Total Protein: 6.9 g/dL (ref 6.5–8.1)

## 2022-02-13 LAB — CBC WITH DIFFERENTIAL (CANCER CENTER ONLY)
Abs Immature Granulocytes: 0.01 10*3/uL (ref 0.00–0.07)
Basophils Absolute: 0 10*3/uL (ref 0.0–0.1)
Basophils Relative: 0 %
Eosinophils Absolute: 0 10*3/uL (ref 0.0–0.5)
Eosinophils Relative: 2 %
HCT: 29.3 % — ABNORMAL LOW (ref 36.0–46.0)
Hemoglobin: 9.5 g/dL — ABNORMAL LOW (ref 12.0–15.0)
Immature Granulocytes: 1 %
Lymphocytes Relative: 53 %
Lymphs Abs: 1.1 10*3/uL (ref 0.7–4.0)
MCH: 31.5 pg (ref 26.0–34.0)
MCHC: 32.4 g/dL (ref 30.0–36.0)
MCV: 97 fL (ref 80.0–100.0)
Monocytes Absolute: 0.3 10*3/uL (ref 0.1–1.0)
Monocytes Relative: 15 %
Neutro Abs: 0.6 10*3/uL — ABNORMAL LOW (ref 1.7–7.7)
Neutrophils Relative %: 29 %
Platelet Count: 71 10*3/uL — ABNORMAL LOW (ref 150–400)
RBC: 3.02 MIL/uL — ABNORMAL LOW (ref 3.87–5.11)
RDW: 18.2 % — ABNORMAL HIGH (ref 11.5–15.5)
Smear Review: NORMAL
WBC Count: 2 10*3/uL — ABNORMAL LOW (ref 4.0–10.5)
nRBC: 1.5 % — ABNORMAL HIGH (ref 0.0–0.2)

## 2022-02-13 LAB — LACTATE DEHYDROGENASE: LDH: 192 U/L (ref 98–192)

## 2022-02-13 LAB — VITAMIN B12: Vitamin B-12: 4039 pg/mL — ABNORMAL HIGH (ref 180–914)

## 2022-02-13 LAB — FERRITIN: Ferritin: 39 ng/mL (ref 11–307)

## 2022-02-19 ENCOUNTER — Encounter: Payer: Self-pay | Admitting: Hematology

## 2022-02-19 NOTE — Progress Notes (Signed)
HEMATOLOGY/ONCOLOGY CLINIC NOTE  Date of Service:02/13/2022   Patient Care Team: Carol Ada, MD as PCP - General (Family Medicine) Josue Hector, MD as PCP - Cardiology (Cardiology)  CHIEF COMPLAINTS/PURPOSE OF CONSULTATION:  Follow-up for continued evaluation and management of MDS with MF   HISTORY OF PRESENTING ILLNESS:  Please see previous note for details on initial presentation  Interval History:   Karen Waller is a 67 y.o. female who is here for continued evaluation and management of her MDS with myelofibrosis and associated cytopenias. She notes no acute new issues with infections since her last visit.  Continues to have easy bruisability but no significant bleeding issues.  Mild fatigue but no significant change in energy levels. Labs done today were discussed with her in detail. She continues to have follow-up with Dr. Nadara Mustard at [facility] for ongoing transplant considerations.  Next visit with Dr. Nadara Mustard is in February 2024.  MEDICAL HISTORY:  Past Medical History:  Diagnosis Date   Cardiomyopathy    EF 30-35% diagnosed 02/2008   Central obesity    Gout    left knee   HTN (hypertension)    Pancytopenia ([facility])     SURGICAL HISTORY: Past Surgical History:  Procedure Laterality Date   KNEE SURGERY     TONSILLECTOMY      SOCIAL HISTORY: Social History   Socioeconomic History   Marital status: Married    Spouse name: Not on file   Number of children: 2   Years of education: Not on file   Highest education level: Not on file  Occupational History   Occupation: [employer]  Tobacco Use   Smoking status: Former    Types: Cigarettes    Quit date: 02/19/1984    Years since quitting: 38.0   Smokeless tobacco: Never  Vaping Use   Vaping Use: Never used  Substance and Sexual Activity   Alcohol use: Not Currently   Drug use: Never   Sexual activity: Not on file  Other Topics Concern   Not on file  Social History Narrative   Not on file    Social Determinants of Health   Financial Resource Strain: Not on file  Food Insecurity: Not on file  Transportation Needs: Not on file  Physical Activity: Not on file  Stress: Not on file  Social Connections: Not on file  Intimate Partner Violence: Not on file    FAMILY HISTORY: Family History  Problem Relation Age of Onset   Hypertension Mother    Breast cancer Neg Hx     ALLERGIES:  is allergic to sulfa antibiotics, codeine, and simvastatin.  MEDICATIONS:  Current Outpatient Medications  Medication Sig Dispense Refill   acetaminophen (TYLENOL) 500 MG tablet Take 500 mg by mouth every 6 (six) hours as needed for mild pain.     allopurinol (ZYLOPRIM) 100 MG tablet TAKE 1 TABLET(100 MG) BY MOUTH DAILY 90 tablet 1   amLODipine (NORVASC) 10 MG tablet TAKE 1 TABLET(10 MG) BY MOUTH DAILY (Patient taking differently: Take 10 mg by mouth daily.) 90 tablet 3   B Complex Vitamins (B COMPLEX 100 PO) Take 1 tablet by mouth daily.     candesartan (ATACAND) 16 MG tablet TAKE 1 TABLET BY MOUTH DAILY (Patient taking differently: Take 16 mg by mouth daily.) 90 tablet 3   carvedilol (COREG) 6.25 MG tablet Take 1 tablet (6.25 mg total) by mouth 2 (two) times daily with a meal. 180 tablet 3   furosemide (LASIX) 20 MG  tablet Take 0.5 tablets (10 mg total) by mouth daily. 45 tablet 3   hydrALAZINE (APRESOLINE) 25 MG tablet Take 1 tablet (25 mg total) by mouth 2 (two) times daily. 180 tablet 3   lidocaine (XYLOCAINE) 2 % solution Use as directed 15 mLs in the mouth or throat every 3 (three) hours as needed for mouth pain (Sore throat). 300 mL 0   Multiple Vitamin (MULTIVITAMIN) capsule Take 1 capsule by mouth daily.     nystatin (MYCOSTATIN) 100000 UNIT/ML suspension Take 5 mLs (500,000 Units total) by mouth 4 (four) times daily. 200 mL 0   rosuvastatin (CRESTOR) 5 MG tablet Take 1 tablet (5 mg total) by mouth at bedtime. 90 tablet 3   tiZANidine (ZANAFLEX) 4 MG tablet Take 1 tablet (4 mg total) by  mouth every 6 (six) hours as needed for muscle spasms. 30 tablet 0   No current facility-administered medications for this visit.    REVIEW OF SYSTEMS:   10 Point review of Systems was done is negative except as noted above.  PHYSICAL EXAMINATION:  Vitals:   02/13/22 1216  BP: (!) 126/57  Pulse: 82  Resp: 18  Temp: 97.8 F (36.6 C)  SpO2: 97%    Filed Weights   02/13/22 1216  Weight: 194 lb 1.6 oz (88 kg)    .Body mass index is 32.3 kg/m.  NAD GENERAL:alert, in no acute distress and comfortable SKIN: no acute rashes, no significant lesions EYES: conjunctiva are pink and non-injected, sclera anicteric OROPHARYNX: MMM, no exudates, no oropharyngeal erythema or ulceration NECK: supple, no JVD LYMPH:  no palpable lymphadenopathy in the cervical, axillary or inguinal regions LUNGS: clear to auscultation b/l with normal respiratory effort HEART: regular rate & rhythm ABDOMEN:  normoactive bowel sounds , non tender, not distended. Extremity: no pedal edema PSYCH: alert & oriented x 3 with fluent speech NEURO: no focal motor/sensory deficits  Exam performed in chair.  LABORATORY DATA:  I have reviewed the data as listed .    Latest Ref Rng & Units 02/13/2022   12:01 PM 11/12/2021   12:36 PM 09/10/2021   11:50 AM  CBC  WBC 4.0 - 10.5 K/uL 2.0  2.2  3.5   Hemoglobin 12.0 - 15.0 g/dL 9.5  9.7  10.7   Hematocrit 36.0 - 46.0 % 29.3  30.3  33.1   Platelets 150 - 400 K/uL 71  80  100    ANC 600      Latest Ref Rng & Units 02/13/2022   12:01 PM 12/06/2021   10:29 AM 11/12/2021   12:36 PM  CMP  Glucose 70 - 99 mg/dL 163   146   BUN 8 - 23 mg/dL 17   19   Creatinine 0.44 - 1.00 mg/dL 1.06   0.97   Sodium 135 - 145 mmol/L 141   140   Potassium 3.5 - 5.1 mmol/L 4.2   3.9   Chloride 98 - 111 mmol/L 109   108   CO2 22 - 32 mmol/L 27   25   Calcium 8.9 - 10.3 mg/dL 9.5   9.4   Total Protein 6.5 - 8.1 g/dL 6.9  6.7  7.3   Total Bilirubin 0.3 - 1.2 mg/dL 0.7  0.7  0.6    Alkaline Phos 38 - 126 U/L 71  83  69   AST 15 - 41 U/L _0 ALT 0 - 44 U/L 12  10  12  11/26/2018 BM Flow Pathology Report    11/26/2018 BM Bx Report    Surgical Pathology  CASE: [id number]  PATIENT: Karen Waller  Bone Marrow Report    Clinical History: pancytopenia, paraproteinemia, right iliac bone      DIAGNOSIS:   BONE MARROW, ASPIRATE, CLOT, CORE:  -Hypercellular bone marrow for age with trilineage hematopoiesis and  fibrosis.  See comment   PERIPHERAL BLOOD:  -Pancytopenia   COMMENT:   The aspirate, touch imprints, and clot sections are suboptimal which  hinders cytomorphologic evaluation.  The core biopsy is optimal and  shows a hypercellular bone marrow (90%) with a mixture of myeloid cell  lines.  This is associated with reticulin fibrosis.  A significant  CD34-positive blast population is not seen.  The myeloid changes are not  considered specific, and hence, it is unclear whether the findings  represent a primary myeloid neoplasm or represent secondary changes.  Correlation with cytogenetic studies is recommended.  There is no  evidence of lymphoproliferative process or diagnostic plasma cell  neoplasm.    11/26/2018 BM Cytogenetics:    RADIOGRAPHIC STUDIES: I have personally reviewed the radiological images as listed and agreed with the findings in the report. No results found.   ASSESSMENT & PLAN:   67 y.o. female with  1. Minor Spontaneous?/undetected trauma causing bruising No ecchymosis with bruising  Clotting parameters normal from 06/17/17 with normal PT, aPTT and VWD panel and platelet function testing 01/08/18 PT and aPTT, fibrinogen WNL  2. MDS/MPN with fibrosis -- causing Pancytopenia --  -Previous bone marrow biopsy was done in October 2020 showing possible MDS/MPN overlap versus MDS with fibrosis.  No increase in blasts cytogenetics with deletion 20 q. NGS with only CBL mutation.  Oct 2020 BM Bx -  hypercellular bone marrow with some reticulin fibrosis. 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 NGS molecular testing showed CBL mutation   PLAN:-  Labs done today were discussed in detail with the patient  CBC shows leukopenia with WBC count of 2k with an ANC of 600 anemia with a hemoglobin of 9.5 and platelets of 71k CMP stable No LDH within normal limits Patient continues to follow with Dr. Nadara Mustard at [facility] to determine the right time consideration of bone marrow transplant based on available consult options. We discussed the possible pros and cons of considering Vidaza treatment.Patient notes some mild bruisability but no significant hematomas or other bleeding issues. No infection issues No new fatigue  Patient continues to have fluctuating cytopenias. No indication for transfusion support at this time . If the patient chooses not to pursue transplantation and her cytopenias worsen might need to consider hypomethylating agents.  Follow-up Phone visit with Dr Irene Limbo and labs in 3 months  The total time spent in the appointment was 20 minutes*.  All of the patient's questions were answered with apparent satisfaction. The patient knows to call the clinic with any problems, questions or concerns.   Sullivan Lone MD MS AAHIVMS [facility] Hematology/Oncology Physician [facility]  .*Total Encounter Time as defined by the Centers for Medicare and Medicaid Services includes, in addition to the face-to-face time of a patient visit (documented in the note above) non-face-to-face time: obtaining and reviewing outside history, ordering and reviewing medications, tests or procedures, care coordination (communications with other health care professionals or caregivers) and documentation in the medical record.

## 2022-03-11 ENCOUNTER — Ambulatory Visit: Payer: No Typology Code available for payment source | Attending: Cardiovascular Disease

## 2022-03-11 DIAGNOSIS — I2510 Atherosclerotic heart disease of native coronary artery without angina pectoris: Secondary | ICD-10-CM

## 2022-03-11 DIAGNOSIS — E782 Mixed hyperlipidemia: Secondary | ICD-10-CM

## 2022-03-11 DIAGNOSIS — I2583 Coronary atherosclerosis due to lipid rich plaque: Secondary | ICD-10-CM | POA: Diagnosis not present

## 2022-03-11 LAB — LIPID PANEL
Chol/HDL Ratio: 2.6 ratio (ref 0.0–4.4)
Cholesterol, Total: 108 mg/dL (ref 100–199)
HDL: 42 mg/dL (ref >39)
LDL Chol Calc (NIH): 49 mg/dL (ref 0–99)
Triglycerides: 87 mg/dL (ref 0–149)
VLDL Cholesterol Cal: 17 mg/dL (ref 5–40)

## 2022-03-11 LAB — HEPATIC FUNCTION PANEL
ALT: 21 IU/L (ref 0–32)
AST: 21 IU/L (ref 0–40)
Albumin: 4.3 g/dL (ref 3.9–4.9)
Alkaline Phosphatase: 82 IU/L (ref 44–121)
Bilirubin Total: 0.6 mg/dL (ref 0.0–1.2)
Bilirubin, Direct: 0.19 mg/dL (ref 0.00–0.40)
Total Protein: 6.6 g/dL (ref 6.0–8.5)

## 2022-03-25 DIAGNOSIS — D469 Myelodysplastic syndrome, unspecified: Secondary | ICD-10-CM | POA: Diagnosis not present

## 2022-03-25 DIAGNOSIS — D61818 Other pancytopenia: Secondary | ICD-10-CM | POA: Diagnosis not present

## 2022-03-25 DIAGNOSIS — M170 Bilateral primary osteoarthritis of knee: Secondary | ICD-10-CM | POA: Diagnosis not present

## 2022-03-25 DIAGNOSIS — I2510 Atherosclerotic heart disease of native coronary artery without angina pectoris: Secondary | ICD-10-CM | POA: Diagnosis not present

## 2022-03-25 DIAGNOSIS — I428 Other cardiomyopathies: Secondary | ICD-10-CM | POA: Diagnosis not present

## 2022-04-11 ENCOUNTER — Other Ambulatory Visit: Payer: Self-pay | Admitting: Hematology

## 2022-04-17 DIAGNOSIS — I10 Essential (primary) hypertension: Secondary | ICD-10-CM | POA: Diagnosis not present

## 2022-04-17 DIAGNOSIS — D469 Myelodysplastic syndrome, unspecified: Secondary | ICD-10-CM | POA: Diagnosis not present

## 2022-04-17 DIAGNOSIS — R002 Palpitations: Secondary | ICD-10-CM | POA: Diagnosis not present

## 2022-05-08 ENCOUNTER — Other Ambulatory Visit: Payer: Self-pay

## 2022-05-08 DIAGNOSIS — D469 Myelodysplastic syndrome, unspecified: Secondary | ICD-10-CM

## 2022-05-10 ENCOUNTER — Inpatient Hospital Stay (HOSPITAL_BASED_OUTPATIENT_CLINIC_OR_DEPARTMENT_OTHER): Payer: No Typology Code available for payment source | Admitting: Hematology

## 2022-05-10 ENCOUNTER — Inpatient Hospital Stay: Payer: No Typology Code available for payment source | Attending: Hematology

## 2022-05-10 ENCOUNTER — Other Ambulatory Visit: Payer: Self-pay

## 2022-05-10 VITALS — BP 132/73 | HR 70 | Temp 97.3°F | Resp 20 | Wt 194.6 lb

## 2022-05-10 DIAGNOSIS — Z87891 Personal history of nicotine dependence: Secondary | ICD-10-CM | POA: Diagnosis not present

## 2022-05-10 DIAGNOSIS — D469 Myelodysplastic syndrome, unspecified: Secondary | ICD-10-CM | POA: Diagnosis not present

## 2022-05-10 DIAGNOSIS — D61818 Other pancytopenia: Secondary | ICD-10-CM

## 2022-05-10 DIAGNOSIS — D892 Hypergammaglobulinemia, unspecified: Secondary | ICD-10-CM | POA: Insufficient documentation

## 2022-05-10 LAB — CBC WITH DIFFERENTIAL (CANCER CENTER ONLY)
Abs Immature Granulocytes: 0 10*3/uL (ref 0.00–0.07)
Basophils Absolute: 0 10*3/uL (ref 0.0–0.1)
Basophils Relative: 1 %
Eosinophils Absolute: 0 10*3/uL (ref 0.0–0.5)
Eosinophils Relative: 0 %
HCT: 29.2 % — ABNORMAL LOW (ref 36.0–46.0)
Hemoglobin: 9.7 g/dL — ABNORMAL LOW (ref 12.0–15.0)
Immature Granulocytes: 0 %
Lymphocytes Relative: 57 %
Lymphs Abs: 1.3 10*3/uL (ref 0.7–4.0)
MCH: 32.3 pg (ref 26.0–34.0)
MCHC: 33.2 g/dL (ref 30.0–36.0)
MCV: 97.3 fL (ref 80.0–100.0)
Monocytes Absolute: 0.4 10*3/uL (ref 0.1–1.0)
Monocytes Relative: 17 %
Neutro Abs: 0.5 10*3/uL — ABNORMAL LOW (ref 1.7–7.7)
Neutrophils Relative %: 25 %
Platelet Count: 64 10*3/uL — ABNORMAL LOW (ref 150–400)
RBC: 3 MIL/uL — ABNORMAL LOW (ref 3.87–5.11)
RDW: 18.3 % — ABNORMAL HIGH (ref 11.5–15.5)
WBC Count: 2.2 10*3/uL — ABNORMAL LOW (ref 4.0–10.5)
nRBC: 1.8 % — ABNORMAL HIGH (ref 0.0–0.2)

## 2022-05-10 LAB — FERRITIN: Ferritin: 38 ng/mL (ref 11–307)

## 2022-05-10 LAB — VITAMIN B12: Vitamin B-12: 5303 pg/mL — ABNORMAL HIGH (ref 180–914)

## 2022-05-10 LAB — LACTATE DEHYDROGENASE: LDH: 188 U/L (ref 98–192)

## 2022-05-10 LAB — CMP (CANCER CENTER ONLY)
ALT: 11 U/L (ref 0–44)
AST: 15 U/L (ref 15–41)
Albumin: 4.3 g/dL (ref 3.5–5.0)
Alkaline Phosphatase: 70 U/L (ref 38–126)
Anion gap: 7 (ref 5–15)
BUN: 17 mg/dL (ref 8–23)
CO2: 26 mmol/L (ref 22–32)
Calcium: 9.7 mg/dL (ref 8.9–10.3)
Chloride: 110 mmol/L (ref 98–111)
Creatinine: 1.03 mg/dL — ABNORMAL HIGH (ref 0.44–1.00)
GFR, Estimated: >60 mL/min (ref >60)
Glucose, Bld: 106 mg/dL — ABNORMAL HIGH (ref 70–99)
Potassium: 4.6 mmol/L (ref 3.5–5.1)
Sodium: 143 mmol/L (ref 135–145)
Total Bilirubin: 0.7 mg/dL (ref 0.3–1.2)
Total Protein: 6.9 g/dL (ref 6.5–8.1)

## 2022-05-10 NOTE — Progress Notes (Signed)
HEMATOLOGY/ONCOLOGY CLINIC NOTE  Date of Service: 05/10/22    Patient Care Team: Carol Ada, MD as PCP - General (Family Medicine) Josue Hector, MD as PCP - Cardiology (Cardiology)  CHIEF COMPLAINTS/PURPOSE OF CONSULTATION:  Follow-up for continued evaluation and management of MDS with MF   HISTORY OF PRESENTING ILLNESS:  Please see previous note for details on initial presentation  Interval History:   Karen Waller is a 67 y.o. female who is here for continued evaluation and management of her MDS with myelofibrosis and associated cytopenias. Patient was last seen by me on 02/13/2022 and reported easily bruising and mild fatigue.  Today, she is accompanied by her daughter. She reports that she was recently infected with a COVID-19 infection, during which she experienced a sore throat, but had generally not been too bothersome. Patient did receive the COVID-10 booster vaccination in January. She notes that she has also received the influenza and first shingles vaccination in February 2024.  She does note that she did previously exercise through walking frequently prior to her COVID-19 infection, but has not since due to experiencing fatigue. She denies any SOB on exertion.  Patient does report that when she has her teeth cleaned, she does experience mouth bleeds for two days due to clotting. Patient compliantly takes vitamin B complex and vitamin D supplements regularly. She denies any abdominal pain, diarrhea, change in bowel habits, or leg swelling.  MEDICAL HISTORY:  Past Medical History:  Diagnosis Date   Cardiomyopathy    EF 30-35% diagnosed 02/2008   Central obesity    Gout    left knee   HTN (hypertension)    Pancytopenia (HCC)     SURGICAL HISTORY: Past Surgical History:  Procedure Laterality Date   KNEE SURGERY     TONSILLECTOMY      SOCIAL HISTORY: Social History   Socioeconomic History   Marital status: Married    Spouse name: Not on file    Number of children: 2   Years of education: Not on file   Highest education level: Not on file  Occupational History   Occupation: [employer]  Tobacco Use   Smoking status: Former    Types: Cigarettes    Quit date: 02/19/1984    Years since quitting: 38.2   Smokeless tobacco: Never  Vaping Use   Vaping Use: Never used  Substance and Sexual Activity   Alcohol use: Not Currently   Drug use: Never   Sexual activity: Not on file  Other Topics Concern   Not on file  Social History Narrative   Not on file   Social Determinants of Health   Financial Resource Strain: Not on file  Food Insecurity: Not on file  Transportation Needs: Not on file  Physical Activity: Not on file  Stress: Not on file  Social Connections: Not on file  Intimate Partner Violence: Not on file    FAMILY HISTORY: Family History  Problem Relation Age of Onset   Hypertension Mother    Breast cancer Neg Hx     ALLERGIES:  is allergic to sulfa antibiotics, codeine, and simvastatin.  MEDICATIONS:  Current Outpatient Medications  Medication Sig Dispense Refill   acetaminophen (TYLENOL) 500 MG tablet Take 500 mg by mouth every 6 (six) hours as needed for mild pain.     allopurinol (ZYLOPRIM) 100 MG tablet TAKE 1 TABLET(100 MG) BY MOUTH DAILY 90 tablet 1   amLODipine (NORVASC) 10 MG tablet TAKE 1 TABLET(10 MG) BY MOUTH DAILY (  Patient taking differently: Take 10 mg by mouth daily.) 90 tablet 3   B Complex Vitamins (B COMPLEX 100 PO) Take 1 tablet by mouth daily.     candesartan (ATACAND) 16 MG tablet TAKE 1 TABLET BY MOUTH DAILY (Patient taking differently: Take 16 mg by mouth daily.) 90 tablet 3   carvedilol (COREG) 6.25 MG tablet Take 1 tablet (6.25 mg total) by mouth 2 (two) times daily with a meal. 180 tablet 3   furosemide (LASIX) 20 MG tablet Take 0.5 tablets (10 mg total) by mouth daily. 45 tablet 3   hydrALAZINE (APRESOLINE) 25 MG tablet Take 1 tablet (25 mg total) by mouth 2 (two) times daily. 180 tablet  3   lidocaine (XYLOCAINE) 2 % solution Use as directed 15 mLs in the mouth or throat every 3 (three) hours as needed for mouth pain (Sore throat). 300 mL 0   Multiple Vitamin (MULTIVITAMIN) capsule Take 1 capsule by mouth daily.     nystatin (MYCOSTATIN) 100000 UNIT/ML suspension Take 5 mLs (500,000 Units total) by mouth 4 (four) times daily. 200 mL 0   rosuvastatin (CRESTOR) 5 MG tablet Take 1 tablet (5 mg total) by mouth at bedtime. 90 tablet 3   tiZANidine (ZANAFLEX) 4 MG tablet Take 1 tablet (4 mg total) by mouth every 6 (six) hours as needed for muscle spasms. 30 tablet 0   No current facility-administered medications for this visit.    REVIEW OF SYSTEMS:    10 Point review of Systems was done is negative except as noted above.   PHYSICAL EXAMINATION:  Vitals:   05/10/22 1300  BP: 132/73  Pulse: 70  Resp: 20  Temp: (!) 97.3 F (36.3 C)  SpO2: 99%   Filed Weights   05/10/22 1300  Weight: 194 lb 9.6 oz (88.3 kg)  .Body mass index is 32.38 kg/m.  GENERAL:alert, in no acute distress and comfortable SKIN: no acute rashes, no significant lesions EYES: conjunctiva are pink and non-injected, sclera anicteric OROPHARYNX: MMM, no exudates, no oropharyngeal erythema or ulceration NECK: supple, no JVD LYMPH:  no palpable lymphadenopathy in the cervical, axillary or inguinal regions LUNGS: clear to auscultation b/l with normal respiratory effort HEART: regular rate & rhythm ABDOMEN:  normoactive bowel sounds , non tender, not distended. Extremity: no pedal edema PSYCH: alert & oriented x 3 with fluent speech NEURO: no focal motor/sensory deficits   LABORATORY DATA:  I have reviewed the data as listed .    Latest Ref Rng & Units 05/10/2022   12:50 PM 02/13/2022   12:01 PM 11/12/2021   12:36 PM  CBC  WBC 4.0 - 10.5 K/uL 2.2  2.0  2.2   Hemoglobin 12.0 - 15.0 g/dL 9.7  9.5  9.7   Hematocrit 36.0 - 46.0 % 29.2  29.3  30.3   Platelets 150 - 400 K/uL 64  71  80    ANC  500     Latest Ref Rng & Units 05/10/2022   12:50 PM 03/11/2022    8:55 AM 02/13/2022   12:01 PM  CMP  Glucose 70 - 99 mg/dL 106   163   BUN 8 - 23 mg/dL 17   17   Creatinine 0.44 - 1.00 mg/dL 1.03   1.06   Sodium 135 - 145 mmol/L 143   141   Potassium 3.5 - 5.1 mmol/L 4.6   4.2   Chloride 98 - 111 mmol/L 110   109   CO2 22 - 32 mmol/L 26  27   Calcium 8.9 - 10.3 mg/dL 9.7   9.5   Total Protein 6.5 - 8.1 g/dL 6.9  6.6  6.9   Total Bilirubin 0.3 - 1.2 mg/dL 0.7  0.6  0.7   Alkaline Phos 38 - 126 U/L 70  82  71   AST 15 - 41 U/L 15  21  15    ALT 0 - 44 U/L 11  21  12      11/26/2018 BM Flow Pathology Report    11/26/2018 BM Bx Report    Surgical Pathology  CASE: [id number]  PATIENT: Brook Sauser  Bone Marrow Report    Clinical History: pancytopenia, paraproteinemia, right iliac bone      DIAGNOSIS:   BONE MARROW, ASPIRATE, CLOT, CORE:  -Hypercellular bone marrow for age with trilineage hematopoiesis and  fibrosis.  See comment   PERIPHERAL BLOOD:  -Pancytopenia   COMMENT:   The aspirate, touch imprints, and clot sections are suboptimal which  hinders cytomorphologic evaluation.  The core biopsy is optimal and  shows a hypercellular bone marrow (90%) with a mixture of myeloid cell  lines.  This is associated with reticulin fibrosis.  A significant  CD34-positive blast population is not seen.  The myeloid changes are not  considered specific, and hence, it is unclear whether the findings  represent a primary myeloid neoplasm or represent secondary changes.  Correlation with cytogenetic studies is recommended.  There is no  evidence of lymphoproliferative process or diagnostic plasma cell  neoplasm.    11/26/2018 BM Cytogenetics:    RADIOGRAPHIC STUDIES: I have personally reviewed the radiological images as listed and agreed with the findings in the report. No results found.   ASSESSMENT & PLAN:   67 y.o. female with  1. Minor  Spontaneous?/undetected trauma causing bruising No ecchymosis with bruising  Clotting parameters normal from 06/17/17 with normal PT, aPTT and VWD panel and platelet function testing 01/08/18 PT and aPTT, fibrinogen WNL  2. MDS/MPN with fibrosis -- causing Pancytopenia --  -Previous bone marrow biopsy was done in October 2020 showing possible MDS/MPN overlap versus MDS with fibrosis.  No increase in blasts cytogenetics with deletion 20 q. NGS with only CBL mutation.  Oct 2020 BM Bx - hypercellular bone marrow with some reticulin fibrosis. 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 NGS molecular testing showed CBL mutation   PLAN:  -discussed lab results on 05/10/2022 in detail with patient. CBC showed WBC of 2.2K, hemoglobin stable at 9.7, and platelets of 64K -total WBC stable -patient is planning on receiving her second shingles vaccine soon -Neutrophils pending, last lab revealed 600-800 -pt has been fairly stable in terms of infection issues lack of transfusion needs, and bleeding issues -discussed possible treatment options at great length.  -discussed the driving factors regarding whether to proceed with a bone marrow transplant  -discussed several considerations regarding the bone marrow transplant at great length -informed patient that treatment related mortality is increased with age -no significant need for immediate transplant at this time -discussed option of Vidaza in leu of or as a bridge to receiving a bone marrow transplant -discussed option of a mouth wash to use after cleaning to improve with oral bleeding, though it is not typically intended for dental cleanings -Advised patient to continue to exercise regularly -Patient shall RTC in 3 months for continued monitoring -recommended patient to receive the RSV vaccination as it is generally recommended for patients above 60 years -Recommended patient to avoid unpasteurized juices  and cultured  cheeses. Recommend to only consume salads if made at home to decrease risk of infections.  -informed patient of increased risk of nose and ear infection in pools -answered all of patient's questions at great length -Patient continues to follow up with Dr. Nadara Mustard regularly with alternating visits Patient continues to follow with Dr. Nadara Mustard at [facility] to determine the right time consideration of bone marrow transplant based on available consult options. We discussed the possible pros and cons of considering Vidaza treatment. Patient continues to have fluctuating cytopenias. No indication for transfusion support at this time . If the patient chooses not to pursue transplantation and her cytopenias worsen might need to consider hypomethylating agents.  Follow-up RTC with Dr Irene Limbo with labs in 3 months  The total time spent in the appointment was 30 minutes* .  All of the patient's questions were answered with apparent satisfaction. The patient knows to call the clinic with any problems, questions or concerns.   Sullivan Lone MD MS AAHIVMS [facility] Hematology/Oncology Physician [facility]  .*Total Encounter Time as defined by the Centers for Medicare and Medicaid Services includes, in addition to the face-to-face time of a patient visit (documented in the note above) non-face-to-face time: obtaining and reviewing outside history, ordering and reviewing medications, tests or procedures, care coordination (communications with other health care professionals or caregivers) and documentation in the medical record.    I,Mitra Faeizi,acting as a Education administrator for Sullivan Lone, MD.,have documented all relevant documentation on the behalf of Sullivan Lone, MD,as directed by  Sullivan Lone, MD while in the presence of Sullivan Lone, MD.  .I have reviewed the above documentation for accuracy and completeness, and I agree with the above. Brunetta Genera MD

## 2022-05-16 ENCOUNTER — Encounter: Payer: Self-pay | Admitting: Hematology

## 2022-05-27 ENCOUNTER — Other Ambulatory Visit: Payer: Self-pay | Admitting: Cardiovascular Disease

## 2022-05-28 ENCOUNTER — Other Ambulatory Visit: Payer: Self-pay | Admitting: *Deleted

## 2022-05-28 MED ORDER — FUROSEMIDE 20 MG PO TABS: 10.0000 mg | ORAL_TABLET | Freq: Every day | ORAL | 1 refills | Status: DC

## 2022-06-01 ENCOUNTER — Other Ambulatory Visit: Payer: Self-pay | Admitting: Cardiovascular Disease

## 2022-06-03 DIAGNOSIS — H6123 Impacted cerumen, bilateral: Secondary | ICD-10-CM | POA: Diagnosis not present

## 2022-06-05 ENCOUNTER — Other Ambulatory Visit: Payer: Self-pay

## 2022-06-05 MED ORDER — AMLODIPINE BESYLATE 10 MG PO TABS: ORAL_TABLET | ORAL | 0 refills | Status: DC

## 2022-06-11 ENCOUNTER — Other Ambulatory Visit: Payer: Self-pay

## 2022-06-11 MED ORDER — ROSUVASTATIN CALCIUM 5 MG PO TABS: 5.0000 mg | ORAL_TABLET | Freq: Every evening | ORAL | 0 refills | Status: DC

## 2022-06-11 MED ORDER — CANDESARTAN CILEXETIL 16 MG PO TABS: 16.0000 mg | ORAL_TABLET | Freq: Every day | ORAL | 0 refills | Status: DC

## 2022-06-11 NOTE — Telephone Encounter (Signed)
Pt's medication was sent to pt's pharmacy as requested. Confirmation received.  °

## 2022-06-17 ENCOUNTER — Other Ambulatory Visit: Payer: Self-pay

## 2022-06-17 ENCOUNTER — Emergency Department (HOSPITAL_COMMUNITY)
Admission: EM | Admit: 2022-06-17 | Discharge: 2022-06-17 | Disposition: A | Discharge status: Home / Self Care | Payer: No Typology Code available for payment source | Attending: Emergency Medicine | Admitting: Emergency Medicine

## 2022-06-17 ENCOUNTER — Emergency Department (HOSPITAL_COMMUNITY): Payer: No Typology Code available for payment source

## 2022-06-17 DIAGNOSIS — M25519 Pain in unspecified shoulder: Secondary | ICD-10-CM | POA: Diagnosis not present

## 2022-06-17 DIAGNOSIS — R079 Chest pain, unspecified: Secondary | ICD-10-CM | POA: Diagnosis not present

## 2022-06-17 DIAGNOSIS — M542 Cervicalgia: Secondary | ICD-10-CM

## 2022-06-17 DIAGNOSIS — R42 Dizziness and giddiness: Secondary | ICD-10-CM | POA: Insufficient documentation

## 2022-06-17 DIAGNOSIS — I10 Essential (primary) hypertension: Secondary | ICD-10-CM | POA: Diagnosis not present

## 2022-06-17 DIAGNOSIS — I700 Atherosclerosis of aorta: Secondary | ICD-10-CM | POA: Diagnosis not present

## 2022-06-17 DIAGNOSIS — R519 Headache, unspecified: Secondary | ICD-10-CM | POA: Diagnosis not present

## 2022-06-17 DIAGNOSIS — Z79899 Other long term (current) drug therapy: Secondary | ICD-10-CM | POA: Insufficient documentation

## 2022-06-17 DIAGNOSIS — M25511 Pain in right shoulder: Secondary | ICD-10-CM | POA: Insufficient documentation

## 2022-06-17 LAB — COMPREHENSIVE METABOLIC PANEL
ALT: 45 U/L — ABNORMAL HIGH (ref 0–44)
AST: 40 U/L (ref 15–41)
Albumin: 4.1 g/dL (ref 3.5–5.0)
Alkaline Phosphatase: 82 U/L (ref 38–126)
Anion gap: 10 (ref 5–15)
BUN: 16 mg/dL (ref 8–23)
CO2: 22 mmol/L (ref 22–32)
Calcium: 9.1 mg/dL (ref 8.9–10.3)
Chloride: 105 mmol/L (ref 98–111)
Creatinine, Ser: 1.01 mg/dL — ABNORMAL HIGH (ref 0.44–1.00)
GFR, Estimated: >60 mL/min (ref >60)
Glucose, Bld: 129 mg/dL — ABNORMAL HIGH (ref 70–99)
Potassium: 3.8 mmol/L (ref 3.5–5.1)
Sodium: 137 mmol/L (ref 135–145)
Total Bilirubin: 0.9 mg/dL (ref 0.3–1.2)
Total Protein: 7.4 g/dL (ref 6.5–8.1)

## 2022-06-17 LAB — CBC WITH DIFFERENTIAL/PLATELET
Abs Immature Granulocytes: 0.15 10*3/uL — ABNORMAL HIGH (ref 0.00–0.07)
Basophils Absolute: 0 10*3/uL (ref 0.0–0.1)
Basophils Relative: 0 %
Eosinophils Absolute: 0 10*3/uL (ref 0.0–0.5)
Eosinophils Relative: 1 %
HCT: 30.9 % — ABNORMAL LOW (ref 36.0–46.0)
Hemoglobin: 9.8 g/dL — ABNORMAL LOW (ref 12.0–15.0)
Immature Granulocytes: 5 %
Lymphocytes Relative: 36 %
Lymphs Abs: 1 10*3/uL (ref 0.7–4.0)
MCH: 30.7 pg (ref 26.0–34.0)
MCHC: 31.7 g/dL (ref 30.0–36.0)
MCV: 96.9 fL (ref 80.0–100.0)
Monocytes Absolute: 0.4 10*3/uL (ref 0.1–1.0)
Monocytes Relative: 14 %
Neutro Abs: 1.3 10*3/uL — ABNORMAL LOW (ref 1.7–7.7)
Neutrophils Relative %: 44 %
Platelets: 57 10*3/uL — ABNORMAL LOW (ref 150–400)
RBC: 3.19 MIL/uL — ABNORMAL LOW (ref 3.87–5.11)
RDW: 18.2 % — ABNORMAL HIGH (ref 11.5–15.5)
WBC: 2.8 10*3/uL — ABNORMAL LOW (ref 4.0–10.5)
nRBC: 0.7 % — ABNORMAL HIGH (ref 0.0–0.2)

## 2022-06-17 LAB — TROPONIN I (HIGH SENSITIVITY)
Troponin I (High Sensitivity): 3 ng/L (ref <18)
Troponin I (High Sensitivity): 3 ng/L (ref <18)

## 2022-06-17 MED ORDER — CYCLOBENZAPRINE HCL 10 MG PO TABS: 10.0000 mg | ORAL_TABLET | Freq: Two times a day (BID) | ORAL | 0 refills | Status: AC | PRN

## 2022-06-17 MED ORDER — CYCLOBENZAPRINE HCL 10 MG PO TABS
10.0000 mg | ORAL_TABLET | Freq: Once | ORAL | Status: AC
  Administered 2022-06-17: 10 mg via ORAL
  Filled 2022-06-17: qty 1

## 2022-06-17 MED ORDER — ONDANSETRON HCL 4 MG/2ML IJ SOLN
4.0000 mg | Freq: Once | INTRAMUSCULAR | Status: AC
  Administered 2022-06-17: 4 mg via INTRAVENOUS
  Filled 2022-06-17: qty 2

## 2022-06-17 MED ORDER — OXYCODONE HCL 5 MG PO TABS: 5.0000 mg | ORAL_TABLET | ORAL | 0 refills | Status: DC | PRN

## 2022-06-17 MED ORDER — HYDROMORPHONE HCL 1 MG/ML IJ SOLN
0.5000 mg | Freq: Once | INTRAMUSCULAR | Status: AC
  Administered 2022-06-17: 0.5 mg via INTRAVENOUS
  Filled 2022-06-17: qty 1

## 2022-06-17 MED ORDER — KETOROLAC TROMETHAMINE 30 MG/ML IJ SOLN
30.0000 mg | Freq: Once | INTRAMUSCULAR | Status: AC
  Administered 2022-06-17: 30 mg via INTRAVENOUS
  Filled 2022-06-17: qty 1

## 2022-06-17 MED ORDER — METHYLPREDNISOLONE 4 MG PO TBPK: ORAL_TABLET | ORAL | 0 refills | Status: DC

## 2022-06-17 MED ORDER — IOHEXOL 350 MG/ML SOLN
75.0000 mL | Freq: Once | INTRAVENOUS | Status: AC | PRN
  Administered 2022-06-17: 75 mL via INTRAVENOUS

## 2022-06-17 NOTE — Discharge Instructions (Signed)
For your pain, you may take up to 1000mg  of acetaminophen (tylenol) 4 times daily for up to a week. This is the maximum dose of acetminophen (tylenol) you can take from all sources. Please check other over-the-counter medications and prescriptions to ensure you are not taking other medications that contain acetaminophen.  You may also take ibuprofen 400 mg 6 times a day OR 600mg  4 times a day alternating with or at the same time as tylenol.  Take oxycodone as needed for breakthrough pain.  This medication can be addicting, sedating and cause constipation.   Instead of the oxycodone, you may take the muscle relaxant prescribed and lidocaine patch.

## 2022-06-17 NOTE — ED Notes (Signed)
Pt was able to ambulate to restroom with no assistant.

## 2022-06-17 NOTE — ED Provider Notes (Signed)
[place] EMERGENCY DEPARTMENT AT [facility] Provider Note   CSN: [id number] Arrival date & time: 06/17/22  4098     History {Add pertinent medical, surgical, social history, OB history to HPI:1} Chief Complaint  Patient presents with   Right Shoulder Pain    Elonna FALICITY SHEETS is a 67 y.o. female.  HPI      67 year old female with a history of mild dysplastic syndrome with myelofibrosis and associated cytopenias, cardiomyopathy, hypertension who presents with concern for right shoulder pain, neck pain, right upper chest pain, and dizziness.  Reports that she has had shoulder pain for the last 3 days, located in the right shoulder with radiation across through her neck.  It is worse with arm movements, worse with moving her head.  She has associated severe neck pain which is a 10 out of 10.  Overnight, she began to have headache in her left temple that she describes as a sensation like "an ice cream headache" that has been waxing and waning.  Denies that being significant pain, but the neck pain is more severe and associates it with that.  The neck and shoulder pain had a gradual onset.  Denies fever, trauma.  This morning, she developed an episode of lightheadedness and diaphoresis that lasted approximately 15 minutes.  At the time her daughter says that she was breathing heavily.  She reports she does have right sided chest pain closer to her right shoulder that is worse with deep breaths, coughing and palpation.  Reports that she has some chronic nausea that waxes and wanes has been worse over the last week.  Denies vomiting.  Denies numbness, weakness, change in vision, new drooping of the face(reports she has some baseline from birth asymmetry), difficulty walking or difficulty talking.  Denies urinary symptoms, abdominal pain, black or bloody stools.  Home Medications Prior to Admission medications   Medication Sig Start Date End Date Taking? Authorizing Provider   acetaminophen (TYLENOL) 500 MG tablet Take 500 mg by mouth every 6 (six) hours as needed for mild pain.    [provider]  allopurinol (ZYLOPRIM) 100 MG tablet TAKE 1 TABLET(100 MG) BY MOUTH DAILY 04/11/22   Johney Maine, MD  amLODipine (NORVASC) 10 MG tablet TAKE 1 TABLET(10 MG) BY MOUTH DAILY 06/05/22   Wendall Stade, MD  B Complex Vitamins (B COMPLEX 100 PO) Take 1 tablet by mouth daily.    [provider]  candesartan (ATACAND) 16 MG tablet Take 1 tablet (16 mg total) by mouth daily. 06/11/22   Wendall Stade, MD  carvedilol (COREG) 6.25 MG tablet TAKE 1 TABLET BY MOUTH TWICE DAILY WITH A MEAL 06/04/22   Sharlene Dory, PA-C  furosemide (LASIX) 20 MG tablet Take 0.5 tablets (10 mg total) by mouth daily. 05/28/22   Wendall Stade, MD  hydrALAZINE (APRESOLINE) 25 MG tablet TAKE 1 TABLET BY MOUTH TWICE DAILY 06/04/22   Sharlene Dory, PA-C  lidocaine (XYLOCAINE) 2 % solution Use as directed 15 mLs in the mouth or throat every 3 (three) hours as needed for mouth pain (Sore throat). 01/04/22   Theadora Rama Scales, PA-C  Multiple Vitamin (MULTIVITAMIN) capsule Take 1 capsule by mouth daily.    [provider]  nystatin (MYCOSTATIN) 100000 UNIT/ML suspension Take 5 mLs (500,000 Units total) by mouth 4 (four) times daily. 09/17/21   Johney Maine, MD  rosuvastatin (CRESTOR) 5 MG tablet Take 1 tablet (5 mg total) by mouth at bedtime. 06/11/22  Wendall Stade, MD  tiZANidine (ZANAFLEX) 4 MG tablet Take 1 tablet (4 mg total) by mouth every 6 (six) hours as needed for muscle spasms. 09/01/21   Molpus, John, MD      Allergies    Sulfa antibiotics, Codeine, and Simvastatin    Review of Systems   Review of Systems  Physical Exam Updated Vital Signs BP 131/67 (BP Location: Right Arm)   Pulse 75   Temp 98.3 F (36.8 C) (Oral)   Resp 16   SpO2 98%  Physical Exam  ED Results / Procedures / Treatments   Labs (all labs ordered are listed, but only abnormal  results are displayed) Labs Reviewed  CBC WITH DIFFERENTIAL/PLATELET  COMPREHENSIVE METABOLIC PANEL  TROPONIN I (HIGH SENSITIVITY)    EKG None  Radiology No results found.  Procedures Procedures  {Document cardiac monitor, telemetry assessment procedure when appropriate:1}  Medications Ordered in ED Medications - No data to display  ED Course/ Medical Decision Making/ A&P   {   Click here for ABCD2, HEART and other calculatorsREFRESH Note before signing :1}                           67 year old female with a history of myelodysplastic syndrome with myelofibrosis and associated cytopenias, cardiomyopathy, hypertension who presents with concern for right shoulder pain, neck pain, right upper chest pain, and dizziness.  EKG completed and personally evaluated and interpreted by me shows ***  Labs completed and personally about interpreted by me show relatively stable pancytopenia with a hemoglobin of 9.8, white blood cell count of 2.8, platelets of 57,000.  CMP shows electrolytes within normal limits.  Troponin negative and have low suspicion for ACS.  Chest x-ray was completed and personally about interpreted by me shows no active disease.  Given headache, neck pain, pleuritic chest pain, lightheadedness with history of mild dysplastic syndrome, ordered a CT PE study for further evaluation for PE, CT head and cervical spine.     {Document critical care time when appropriate:1} {Document review of labs and clinical decision tools ie heart score, Chads2Vasc2 etc:1}  {Document your independent review of radiology images, and any outside records:1} {Document your discussion with family members, caretakers, and with consultants:1} {Document social determinants of health affecting pt's care:1} {Document your decision making why or why not admission, treatments were needed:1} Final Clinical Impression(s) / ED Diagnoses Final diagnoses:  None    Rx / DC Orders ED Discharge  Orders     None

## 2022-06-17 NOTE — ED Triage Notes (Signed)
Pt BIBA from home with c/o right shoulder pain and dizziness. Shoulder pain since Thursday and has gotten worse. Dizziness x58mins this morning only. Takes muscle relaxers. Denies N/V/D. No deformities noted.  BP 137/67 HR 72 NSR 98% RA CBG 166

## 2022-06-19 DIAGNOSIS — M25511 Pain in right shoulder: Secondary | ICD-10-CM | POA: Diagnosis not present

## 2022-06-24 DIAGNOSIS — I428 Other cardiomyopathies: Secondary | ICD-10-CM | POA: Diagnosis not present

## 2022-06-24 DIAGNOSIS — M170 Bilateral primary osteoarthritis of knee: Secondary | ICD-10-CM | POA: Diagnosis not present

## 2022-06-24 DIAGNOSIS — M19019 Primary osteoarthritis, unspecified shoulder: Secondary | ICD-10-CM | POA: Diagnosis not present

## 2022-06-24 DIAGNOSIS — D469 Myelodysplastic syndrome, unspecified: Secondary | ICD-10-CM | POA: Diagnosis not present

## 2022-06-24 DIAGNOSIS — D61818 Other pancytopenia: Secondary | ICD-10-CM | POA: Diagnosis not present

## 2022-06-26 DIAGNOSIS — M25511 Pain in right shoulder: Secondary | ICD-10-CM | POA: Diagnosis not present

## 2022-07-09 DIAGNOSIS — M25511 Pain in right shoulder: Secondary | ICD-10-CM | POA: Diagnosis not present

## 2022-07-13 ENCOUNTER — Ambulatory Visit (HOSPITAL_COMMUNITY)
Admission: EM | Admit: 2022-07-13 | Discharge: 2022-07-13 | Disposition: A | Discharge status: Home / Self Care | Payer: No Typology Code available for payment source | Attending: Emergency Medicine | Admitting: Emergency Medicine

## 2022-07-13 ENCOUNTER — Other Ambulatory Visit: Payer: Self-pay

## 2022-07-13 ENCOUNTER — Encounter (HOSPITAL_COMMUNITY): Payer: Self-pay | Admitting: *Deleted

## 2022-07-13 DIAGNOSIS — R6884 Jaw pain: Secondary | ICD-10-CM | POA: Diagnosis not present

## 2022-07-13 MED ORDER — AMOXICILLIN-POT CLAVULANATE 875-125 MG PO TABS: 1.0000 | ORAL_TABLET | Freq: Two times a day (BID) | ORAL | 0 refills | Status: DC

## 2022-07-13 NOTE — ED Provider Notes (Signed)
[facility]    CSN: [id number] Arrival date & time: 07/13/22  1608      History   Chief Complaint Chief Complaint  Patient presents with   rt sided facial pain    HPI Karen Waller is a 67 y.o. female.   Presents for evaluation of right-sided jaw pain radiating to the right ear present for 1 day.  Intermittently sending sharp shooting pains.  Has attempted use of Tylenol which has been ineffective.  Denies need for dental work, ear drainage, pruritus, injury or trauma.  Able to tolerate food and liquids.   Past Medical History:  Diagnosis Date   Cardiomyopathy    EF 30-35% diagnosed 02/2008   Central obesity    Gout    left knee   HTN (hypertension)    Pancytopenia (HCC)     Patient Active Problem List   Diagnosis Date Noted   Iron deficiency anemia 06/22/2019   Pain in left hip 03/24/2018   Bruising, spontaneous 07/06/2017   Left Achilles tendinitis 11/08/2016   Conductive hearing loss, bilateral 08/08/2016   Elevated lipids 12/16/2012   HTN (hypertension) 01/05/2011   CHF (congestive heart failure) (HCC) 10/30/2010   Edema 10/30/2010   Gouty arthropathy 03/17/2008   OVERWEIGHT 03/17/2008   ESSENTIAL HYPERTENSION, BENIGN 03/17/2008   Non-ischemic cardiomyopathy (HCC) 03/17/2008   PALPITATIONS, OCCASIONAL 03/17/2008    Past Surgical History:  Procedure Laterality Date   KNEE SURGERY     TONSILLECTOMY      OB History   No obstetric history on file.      Home Medications    Prior to Admission medications   Medication Sig Start Date End Date Taking? Authorizing Provider  tiZANidine (ZANAFLEX) 4 MG tablet Take 1 tablet (4 mg total) by mouth every 6 (six) hours as needed for muscle spasms. 09/01/21  Yes Molpus, John, MD  acetaminophen (TYLENOL) 500 MG tablet Take 500 mg by mouth every 6 (six) hours as needed for mild pain.    [provider]  allopurinol (ZYLOPRIM) 100 MG tablet TAKE 1 TABLET(100 MG) BY MOUTH DAILY 04/11/22   Johney Maine, MD  amLODipine (NORVASC) 10 MG tablet TAKE 1 TABLET(10 MG) BY MOUTH DAILY 06/05/22   Wendall Stade, MD  B Complex Vitamins (B COMPLEX 100 PO) Take 1 tablet by mouth daily.    [provider]  candesartan (ATACAND) 16 MG tablet Take 1 tablet (16 mg total) by mouth daily. 06/11/22   Wendall Stade, MD  carvedilol (COREG) 6.25 MG tablet TAKE 1 TABLET BY MOUTH TWICE DAILY WITH A MEAL 06/04/22   Sharlene Dory, PA-C  cyclobenzaprine (FLEXERIL) 10 MG tablet Take 1 tablet (10 mg total) by mouth 2 (two) times daily as needed for muscle spasms. 06/17/22   Alvira Monday, MD  furosemide (LASIX) 20 MG tablet Take 0.5 tablets (10 mg total) by mouth daily. 05/28/22   Wendall Stade, MD  hydrALAZINE (APRESOLINE) 25 MG tablet TAKE 1 TABLET BY MOUTH TWICE DAILY 06/04/22   Sharlene Dory, PA-C  lidocaine (XYLOCAINE) 2 % solution Use as directed 15 mLs in the mouth or throat every 3 (three) hours as needed for mouth pain (Sore throat). 01/04/22   Theadora Rama Scales, PA-C  methylPREDNISolone (MEDROL DOSEPAK) 4 MG TBPK tablet Pain 06/17/22   Alvira Monday, MD  Multiple Vitamin (MULTIVITAMIN) capsule Take 1 capsule by mouth daily.    [provider]  nystatin (MYCOSTATIN) 100000 UNIT/ML suspension Take 5 mLs (500,000  Units total) by mouth 4 (four) times daily. 09/17/21   Johney Maine, MD  oxyCODONE (ROXICODONE) 5 MG immediate release tablet Take 1 tablet (5 mg total) by mouth every 4 (four) hours as needed for severe pain. 06/17/22   Alvira Monday, MD  rosuvastatin (CRESTOR) 5 MG tablet Take 1 tablet (5 mg total) by mouth at bedtime. 06/11/22   Wendall Stade, MD    Family History Family History  Problem Relation Age of Onset   Hypertension Mother    Breast cancer Neg Hx     Social History Social History   Tobacco Use   Smoking status: Former    Types: Cigarettes    Quit date: 02/19/1984    Years since quitting: 38.4   Smokeless tobacco: Never  Vaping Use    Vaping Use: Never used  Substance Use Topics   Alcohol use: Not Currently   Drug use: Never     Allergies   Sulfa antibiotics, Codeine, and Simvastatin   Review of Systems Review of Systems   Physical Exam Triage Vital Signs ED Triage Vitals  Enc Vitals Group     BP 07/13/22 1636 126/72     Pulse Rate 07/13/22 1636 75     Resp 07/13/22 1636 18     Temp 07/13/22 1636 98.5 F (36.9 C)     Temp src --      SpO2 07/13/22 1636 97 %     Weight --      Height --      Head Circumference --      Peak Flow --      Pain Score 07/13/22 1633 10     Pain Loc --      Pain Edu? --      Excl. in GC? --    No data found.  Updated Vital Signs BP 126/72   Pulse 75   Temp 98.5 F (36.9 C)   Resp 18   SpO2 97%   Visual Acuity Right Eye Distance:   Left Eye Distance:   Bilateral Distance:    Right Eye Near:   Left Eye Near:    Bilateral Near:     Physical Exam Constitutional:      Appearance: Normal appearance.  HENT:     Head: Normocephalic.     Comments: Reproduce tenderness on exam, no mass felt with palpation, no pain elicited with opening and closing of the jaw    Right Ear: Hearing, tympanic membrane, ear canal and external ear normal.     Left Ear: Hearing, tympanic membrane, ear canal and external ear normal.     Mouth/Throat:     Comments: No abnormality present to the oropharynx or to teeth  Eyes:     Extraocular Movements: Extraocular movements intact.  Pulmonary:     Effort: Pulmonary effort is normal.  Neurological:     Mental Status: She is alert and oriented to person, place, and time. Mental status is at baseline.      UC Treatments / Results  Labs (all labs ordered are listed, but only abnormal results are displayed) Labs Reviewed - No data to display  EKG   Radiology No results found.  Procedures Procedures (including critical care time)  Medications Ordered in UC Medications - No data to display  Initial Impression / Assessment  and Plan / UC Course  I have reviewed the triage vital signs and the nursing notes.  Pertinent labs & imaging results that were available during my  care of the patient were reviewed by me and considered in my medical decision making (see chart for details).  Jaw pain  No overt cause, discussed with patient prophylactically providing antibiotic due to area of pain, Augmentin prescribed and discussed administration, recommended continued use of Tylenol, advised follow-up with PCP if symptoms persist or worsen Final Clinical Impressions(s) / UC Diagnoses   Final diagnoses:  None   Discharge Instructions   None    ED Prescriptions   None    PDMP not reviewed this encounter.   Valinda Hoar, NP 07/13/22 1729

## 2022-07-13 NOTE — ED Triage Notes (Signed)
Pt reports Rt sided facial pain at Rt ear. Pt unsure if her tooth is sore or her ear is sore. Pain started yesterday

## 2022-07-13 NOTE — Discharge Instructions (Signed)
On exam there is no overt cause for your jaw pain, there are no masses or tenderness felt along the jawline that would indicate a possible cyst, there are no abnormalities within your mouth that would indicate infection to your teeth and there are no abnormalities to your ear  Based on the placement of your pain provide bacterial coverage to ensure that a germ is not eating to your symptoms  Take Augmentin every morning and every evening for 7 days  You may continue use of Tylenol taking a dose every 6 hours as needed for comfort  You may hold warm compresses over the jaw and the ear for additional support  If your symptoms continue to persist or worsen you may follow-up with his urgent care or your primary doctor for reevaluation

## 2022-07-16 DIAGNOSIS — M25511 Pain in right shoulder: Secondary | ICD-10-CM | POA: Diagnosis not present

## 2022-07-17 ENCOUNTER — Other Ambulatory Visit: Payer: Self-pay | Admitting: Cardiovascular Disease

## 2022-07-18 DIAGNOSIS — M25511 Pain in right shoulder: Secondary | ICD-10-CM | POA: Diagnosis not present

## 2022-07-22 DIAGNOSIS — M25511 Pain in right shoulder: Secondary | ICD-10-CM | POA: Diagnosis not present

## 2022-07-23 DIAGNOSIS — M25511 Pain in right shoulder: Secondary | ICD-10-CM | POA: Diagnosis not present

## 2022-07-24 DIAGNOSIS — M25511 Pain in right shoulder: Secondary | ICD-10-CM | POA: Diagnosis not present

## 2022-07-30 DIAGNOSIS — M25511 Pain in right shoulder: Secondary | ICD-10-CM | POA: Diagnosis not present

## 2022-08-01 DIAGNOSIS — M25511 Pain in right shoulder: Secondary | ICD-10-CM | POA: Diagnosis not present

## 2022-08-05 DIAGNOSIS — M25511 Pain in right shoulder: Secondary | ICD-10-CM | POA: Diagnosis not present

## 2022-08-07 DIAGNOSIS — M25511 Pain in right shoulder: Secondary | ICD-10-CM | POA: Diagnosis not present

## 2022-08-08 ENCOUNTER — Other Ambulatory Visit: Payer: Self-pay

## 2022-08-08 DIAGNOSIS — D469 Myelodysplastic syndrome, unspecified: Secondary | ICD-10-CM

## 2022-08-09 ENCOUNTER — Inpatient Hospital Stay: Payer: No Typology Code available for payment source | Attending: Hematology

## 2022-08-09 ENCOUNTER — Inpatient Hospital Stay (HOSPITAL_BASED_OUTPATIENT_CLINIC_OR_DEPARTMENT_OTHER): Payer: No Typology Code available for payment source | Admitting: Hematology

## 2022-08-09 ENCOUNTER — Other Ambulatory Visit: Payer: Self-pay

## 2022-08-09 VITALS — BP 138/66 | HR 70 | Temp 98.4°F | Resp 20 | Wt 196.8 lb

## 2022-08-09 DIAGNOSIS — D61818 Other pancytopenia: Secondary | ICD-10-CM | POA: Insufficient documentation

## 2022-08-09 DIAGNOSIS — Z87891 Personal history of nicotine dependence: Secondary | ICD-10-CM | POA: Insufficient documentation

## 2022-08-09 DIAGNOSIS — D469 Myelodysplastic syndrome, unspecified: Secondary | ICD-10-CM

## 2022-08-09 LAB — CMP (CANCER CENTER ONLY)
ALT: 12 U/L (ref 0–44)
AST: 15 U/L (ref 15–41)
Albumin: 3.9 g/dL (ref 3.5–5.0)
Alkaline Phosphatase: 66 U/L (ref 38–126)
Anion gap: 6 (ref 5–15)
BUN: 15 mg/dL (ref 8–23)
CO2: 26 mmol/L (ref 22–32)
Calcium: 9.3 mg/dL (ref 8.9–10.3)
Chloride: 108 mmol/L (ref 98–111)
Creatinine: 1.07 mg/dL — ABNORMAL HIGH (ref 0.44–1.00)
GFR, Estimated: 57 mL/min — ABNORMAL LOW (ref >60)
Glucose, Bld: 140 mg/dL — ABNORMAL HIGH (ref 70–99)
Potassium: 3.8 mmol/L (ref 3.5–5.1)
Sodium: 140 mmol/L (ref 135–145)
Total Bilirubin: 0.8 mg/dL (ref 0.3–1.2)
Total Protein: 6.4 g/dL — ABNORMAL LOW (ref 6.5–8.1)

## 2022-08-09 LAB — CBC WITH DIFFERENTIAL (CANCER CENTER ONLY)
Abs Immature Granulocytes: 0 10*3/uL (ref 0.00–0.07)
Basophils Absolute: 0 10*3/uL (ref 0.0–0.1)
Basophils Relative: 0 %
Eosinophils Absolute: 0 10*3/uL (ref 0.0–0.5)
Eosinophils Relative: 0 %
HCT: 30.2 % — ABNORMAL LOW (ref 36.0–46.0)
Hemoglobin: 9.5 g/dL — ABNORMAL LOW (ref 12.0–15.0)
Immature Granulocytes: 0 %
Lymphocytes Relative: 57 %
Lymphs Abs: 1.1 10*3/uL (ref 0.7–4.0)
MCH: 31.5 pg (ref 26.0–34.0)
MCHC: 31.5 g/dL (ref 30.0–36.0)
MCV: 100 fL (ref 80.0–100.0)
Monocytes Absolute: 0.3 10*3/uL (ref 0.1–1.0)
Monocytes Relative: 15 %
Neutro Abs: 0.6 10*3/uL — ABNORMAL LOW (ref 1.7–7.7)
Neutrophils Relative %: 28 %
Platelet Count: 61 10*3/uL — ABNORMAL LOW (ref 150–400)
RBC: 3.02 MIL/uL — ABNORMAL LOW (ref 3.87–5.11)
RDW: 18 % — ABNORMAL HIGH (ref 11.5–15.5)
WBC Count: 2 10*3/uL — ABNORMAL LOW (ref 4.0–10.5)
nRBC: 2 % — ABNORMAL HIGH (ref 0.0–0.2)

## 2022-08-09 LAB — LACTATE DEHYDROGENASE: LDH: 189 U/L (ref 98–192)

## 2022-08-09 LAB — FERRITIN: Ferritin: 41 ng/mL (ref 11–307)

## 2022-08-09 LAB — VITAMIN B12: Vitamin B-12: 3893 pg/mL — ABNORMAL HIGH (ref 180–914)

## 2022-08-09 NOTE — Progress Notes (Signed)
HEMATOLOGY/ONCOLOGY CLINIC NOTE  Date of Service: 08/09/22    Patient Care Team: Merri Brunette, MD as PCP - General (Family Medicine) Wendall Stade, MD as PCP - Cardiology (Cardiology)  CHIEF COMPLAINTS/PURPOSE OF CONSULTATION:  Follow-up for continued evaluation and management of MDS with MF   HISTORY OF PRESENTING ILLNESS:  Please see previous note for details on initial presentation  Interval History:   Karen Waller is a 67 y.o. female who is here for continued evaluation and management of her MDS with myelofibrosis and associated cytopenias. Patient was last seen by me on 08/09/2022 and reported a previous COVID-19 infection, fatigue, and mouth bleeding following dental cleaning.  Today, she complains of left rotator cuff issues. She denies any injuries to the area. She was seen by orthopedics and was given Prednisone and a steroid injection in MAY 2024 which did improve symptoms. Patient has two sessions of steroid injections remaining.  She reports that she has received the first dose of shingles but has not yet received the second dose.   Patient complains of frequent fatigue but is unsure of the reason. She notes that she has recently increased her walking activity. Patient notes that when sleeping on left side, she notices weakness upon awakening.   Her appetite has been fair and her weight has been stable. She reports that she generally does not stay well hydrated but is working towards improving this.   She denies any changes in bowel habits, abdominal pain, or major medication changes. She denies any SOB, abnormal bleeding, or major spontaneous bruising.   MEDICAL HISTORY:  Past Medical History:  Diagnosis Date   Cardiomyopathy    EF 30-35% diagnosed 02/2008   Central obesity    Gout    left knee   HTN (hypertension)    Pancytopenia (HCC)     SURGICAL HISTORY: Past Surgical History:  Procedure Laterality Date   KNEE SURGERY     TONSILLECTOMY       SOCIAL HISTORY: Social History   Socioeconomic History   Marital status: Married    Spouse name: Not on file   Number of children: 2   Years of education: Not on file   Highest education level: Not on file  Occupational History   Occupation: [employer]  Tobacco Use   Smoking status: Former    Types: Cigarettes    Quit date: 02/19/1984    Years since quitting: 38.4   Smokeless tobacco: Never  Vaping Use   Vaping Use: Never used  Substance and Sexual Activity   Alcohol use: Not Currently   Drug use: Never   Sexual activity: Not on file  Other Topics Concern   Not on file  Social History Narrative   Not on file   Social Determinants of Health   Financial Resource Strain: Not on file  Food Insecurity: Not on file  Transportation Needs: Not on file  Physical Activity: Not on file  Stress: Not on file  Social Connections: Not on file  Intimate Partner Violence: Not on file    FAMILY HISTORY: Family History  Problem Relation Age of Onset   Hypertension Mother    Breast cancer Neg Hx     ALLERGIES:  is allergic to sulfa antibiotics, codeine, and simvastatin.  MEDICATIONS:  Current Outpatient Medications  Medication Sig Dispense Refill   acetaminophen (TYLENOL) 500 MG tablet Take 500 mg by mouth every 6 (six) hours as needed for mild pain.     allopurinol (ZYLOPRIM) 100  MG tablet TAKE 1 TABLET(100 MG) BY MOUTH DAILY 90 tablet 1   amLODipine (NORVASC) 10 MG tablet TAKE 1 TABLET(10 MG) BY MOUTH DAILY 90 tablet 0   amoxicillin-clavulanate (AUGMENTIN) 875-125 MG tablet Take 1 tablet by mouth every 12 (twelve) hours. 14 tablet 0   B Complex Vitamins (B COMPLEX 100 PO) Take 1 tablet by mouth daily.     candesartan (ATACAND) 16 MG tablet Take 1 tablet (16 mg total) by mouth daily. 90 tablet 0   carvedilol (COREG) 6.25 MG tablet TAKE 1 TABLET BY MOUTH TWICE DAILY WITH A MEAL 180 tablet 0   cyclobenzaprine (FLEXERIL) 10 MG tablet Take 1 tablet (10 mg total) by mouth 2 (two)  times daily as needed for muscle spasms. 20 tablet 0   furosemide (LASIX) 20 MG tablet Take 0.5 tablets (10 mg total) by mouth daily. 45 tablet 1   hydrALAZINE (APRESOLINE) 25 MG tablet TAKE 1 TABLET BY MOUTH TWICE DAILY 180 tablet 0   lidocaine (XYLOCAINE) 2 % solution Use as directed 15 mLs in the mouth or throat every 3 (three) hours as needed for mouth pain (Sore throat). 300 mL 0   methylPREDNISolone (MEDROL DOSEPAK) 4 MG TBPK tablet Pain 21 each 0   Multiple Vitamin (MULTIVITAMIN) capsule Take 1 capsule by mouth daily.     nystatin (MYCOSTATIN) 100000 UNIT/ML suspension Take 5 mLs (500,000 Units total) by mouth 4 (four) times daily. 200 mL 0   oxyCODONE (ROXICODONE) 5 MG immediate release tablet Take 1 tablet (5 mg total) by mouth every 4 (four) hours as needed for severe pain. 10 tablet 0   rosuvastatin (CRESTOR) 5 MG tablet Take 1 tablet (5 mg total) by mouth at bedtime. 90 tablet 0   tiZANidine (ZANAFLEX) 4 MG tablet Take 1 tablet (4 mg total) by mouth every 6 (six) hours as needed for muscle spasms. 30 tablet 0   No current facility-administered medications for this visit.    REVIEW OF SYSTEMS:    10 Point review of Systems was done is negative except as noted above.   PHYSICAL EXAMINATION:  There were no vitals filed for this visit.  There were no vitals filed for this visit. .There is no height or weight on file to calculate BMI.   GENERAL:alert, in no acute distress and comfortable SKIN: no acute rashes, no significant lesions EYES: conjunctiva are pink and non-injected, sclera anicteric OROPHARYNX: MMM, no exudates, no oropharyngeal erythema or ulceration NECK: supple, no JVD LYMPH:  no palpable lymphadenopathy in the cervical, axillary or inguinal regions LUNGS: clear to auscultation b/l with normal respiratory effort HEART: regular rate & rhythm ABDOMEN:  normoactive bowel sounds , non tender, not distended. Extremity: no pedal edema PSYCH: alert & oriented x 3 with  fluent speech NEURO: no focal motor/sensory deficits   LABORATORY DATA:  I have reviewed the data as listed .    Latest Ref Rng & Units 06/17/2022    9:57 AM 05/10/2022   12:50 PM 02/13/2022   12:01 PM  CBC  WBC 4.0 - 10.5 K/uL 2.8  2.2  2.0   Hemoglobin 12.0 - 15.0 g/dL 9.8  9.7  9.5   Hematocrit 36.0 - 46.0 % 30.9  29.2  29.3   Platelets 150 - 400 K/uL 57  64  71    ANC 500     Latest Ref Rng & Units 06/17/2022    9:57 AM 05/10/2022   12:50 PM 03/11/2022    8:55 AM  CMP  Glucose 70 - 99 mg/dL 478  295    BUN 8 - 23 mg/dL 16  17    Creatinine 6.21 - 1.00 mg/dL 3.08  6.57    Sodium 846 - 145 mmol/L 137  143    Potassium 3.5 - 5.1 mmol/L 3.8  4.6    Chloride 98 - 111 mmol/L 105  110    CO2 22 - 32 mmol/L 22  26    Calcium 8.9 - 10.3 mg/dL 9.1  9.7    Total Protein 6.5 - 8.1 g/dL 7.4  6.9  6.6   Total Bilirubin 0.3 - 1.2 mg/dL 0.9  0.7  0.6   Alkaline Phos 38 - 126 U/L 82  70  82   AST 15 - 41 U/L 40  15  21   ALT 0 - 44 U/L 45  11  21     11/26/2018 [facility] Flow Pathology Report    11/26/2018 BM Bx Report    Surgical Pathology  CASE: [id number]  PATIENT: Layal Fittro  Bone Marrow Report    Clinical History: pancytopenia, paraproteinemia, right iliac bone      DIAGNOSIS:   BONE MARROW, ASPIRATE, CLOT, CORE:  -Hypercellular bone marrow for age with trilineage hematopoiesis and  fibrosis.  See comment   PERIPHERAL BLOOD:  -Pancytopenia   COMMENT:   The aspirate, touch imprints, and clot sections are suboptimal which  hinders cytomorphologic evaluation.  The core biopsy is optimal and  shows a hypercellular bone marrow (90%) with a mixture of myeloid cell  lines.  This is associated with reticulin fibrosis.  A significant  CD34-positive blast population is not seen.  The myeloid changes are not  considered specific, and hence, it is unclear whether the findings  represent a primary myeloid neoplasm or represent secondary changes.  Correlation with  cytogenetic studies is recommended.  There is no  evidence of lymphoproliferative process or diagnostic plasma cell  neoplasm.    11/26/2018 [facility] Cytogenetics:    RADIOGRAPHIC STUDIES: I have personally reviewed the radiological images as listed and agreed with the findings in the report. No results found.   ASSESSMENT & PLAN:   67 y.o. female with  1. Minor Spontaneous?/undetected trauma causing bruising No ecchymosis with bruising  Clotting parameters normal from 06/17/17 with normal PT, aPTT and VWD panel and platelet function testing 01/08/18 PT and aPTT, fibrinogen WNL  2. MDS/MPN with fibrosis -- causing Pancytopenia --  -Previous bone marrow biopsy was done in October 2020 showing possible MDS/MPN overlap versus MDS with fibrosis.  No increase in blasts cytogenetics with deletion 20 q. NGS with only CBL mutation.  Oct 2020 BM Bx - hypercellular bone marrow with some reticulin fibrosis. 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 NGS molecular testing showed CBL mutation   PLAN:  -Discussed lab results on 08/09/2022 in detail with patient. CBC showed WBC of 2.0K, hemoglobin stable at 9.5, and platelets stable at 61K. -WBC shows some fluctuation. WBC previously 2.2K on 05/10/2022 and 2.8K on 06/17/2022,  -patient's fibrosis has been stable -CBC differential lab pending, will evaluate neutropenia levels once results reveal -CMP pending -Iron panel pending -informed patient that factors such as inflammation/pain or steroids may increase neutrophils  -Patient is scheduled to receive a bone marrow biopsy at the end of July  -Patient will be seen by with Dr. Dimas Aguas to discuss consideration of receiving a bone marrow transplant -discussed details of bone-marrow transplant including risks and potential complications -Patient shall RTC  in 3 months -pt has been fairly stable in terms of infection issues lack of transfusion needs, and bleeding  issues -discussed the driving factors regarding whether to proceed with a bone marrow transplant  -discussed several considerations regarding the bone marrow transplant at great length -no significant need for immediate transplant at this time -discussed option of Vidaza in leu of or as a bridge to receiving a bone marrow transplant -Patient continues to follow up with Dr. Dimas Aguas regularly with alternating visits Patient continues to follow with Dr. Dimas Aguas at [facility] to determine the right time consideration of bone marrow transplant based on available consult options. We discussed the possible pros and cons of considering Vidaza treatment. Patient continues to have fluctuating cytopenias. No indication for transfusion support at this time . If the patient chooses not to pursue transplantation and her cytopenias worsen might need to consider hypomethylating agents.  FOLLOW-UP: ***  The total time spent in the appointment was *** minutes* .  All of the patient's questions were answered with apparent satisfaction. The patient knows to call the clinic with any problems, questions or concerns.   Wyvonnia Lora MD MS AAHIVMS [facility] Hematology/Oncology Physician [facility]  .*Total Encounter Time as defined by the Centers for Medicare and Medicaid Services includes, in addition to the face-to-face time of a patient visit (documented in the note above) non-face-to-face time: obtaining and reviewing outside history, ordering and reviewing medications, tests or procedures, care coordination (communications with other health care professionals or caregivers) and documentation in the medical record.    I,Mitra Faeizi,acting as a Neurosurgeon for Wyvonnia Lora, MD.,have documented all relevant documentation on the behalf of Wyvonnia Lora, MD,as directed by  Wyvonnia Lora, MD while in the presence of Wyvonnia Lora, MD.  ***

## 2022-08-13 DIAGNOSIS — M25519 Pain in unspecified shoulder: Secondary | ICD-10-CM | POA: Diagnosis not present

## 2022-08-15 ENCOUNTER — Other Ambulatory Visit: Payer: Self-pay | Admitting: Physician Assistant

## 2022-08-15 ENCOUNTER — Encounter: Payer: Self-pay | Admitting: Hematology

## 2022-08-15 NOTE — Telephone Encounter (Signed)
Patient of Dr. Nishan. Please review for refill. Thank you!  

## 2022-08-15 NOTE — Telephone Encounter (Signed)
Rx request sent to pharmacy.  

## 2022-08-16 ENCOUNTER — Telehealth: Payer: Self-pay | Admitting: Hematology

## 2022-08-16 DIAGNOSIS — H938X2 Other specified disorders of left ear: Secondary | ICD-10-CM | POA: Diagnosis not present

## 2022-08-16 NOTE — Telephone Encounter (Signed)
Patient is aware of upcoming appointment.

## 2022-08-17 ENCOUNTER — Encounter (HOSPITAL_COMMUNITY): Payer: Self-pay

## 2022-08-17 ENCOUNTER — Ambulatory Visit (HOSPITAL_COMMUNITY)
Admission: EM | Admit: 2022-08-17 | Discharge: 2022-08-17 | Disposition: A | Discharge status: Home / Self Care | Payer: No Typology Code available for payment source | Attending: Emergency Medicine | Admitting: Emergency Medicine

## 2022-08-17 DIAGNOSIS — N3010 Interstitial cystitis (chronic) without hematuria: Secondary | ICD-10-CM | POA: Diagnosis not present

## 2022-08-17 LAB — POCT URINALYSIS DIP (MANUAL ENTRY)
Bilirubin, UA: NEGATIVE
Blood, UA: NEGATIVE
Glucose, UA: NEGATIVE mg/dL
Ketones, POC UA: NEGATIVE mg/dL
Leukocytes, UA: NEGATIVE
Nitrite, UA: NEGATIVE
Protein Ur, POC: NEGATIVE mg/dL
Spec Grav, UA: 1.01 (ref 1.010–1.025)
Urobilinogen, UA: 0.2 E.U./dL
pH, UA: 5 (ref 5.0–8.0)

## 2022-08-17 NOTE — ED Provider Notes (Signed)
[facility]    CSN: [id number] Arrival date & time: 08/17/22  1341      History   Chief Complaint Chief Complaint  Patient presents with   Urinary Frequency    HPI Karen Waller is a 67 y.o. female.   67 year old female pt, Karen Waller, presents to ER with chief complaint of urinary frequency, burning with urination that started last night. Pt reports distant hx of UTI's in past.   The history is provided by the patient. No language interpreter was used.    Past Medical History:  Diagnosis Date   Cardiomyopathy    EF 30-35% diagnosed 02/2008   Central obesity    Gout    left knee   HTN (hypertension)    Pancytopenia (HCC)     Patient Active Problem List   Diagnosis Date Noted   Interstitial cystitis 08/17/2022   Iron deficiency anemia 06/22/2019   Pain in left hip 03/24/2018   Bruising, spontaneous 07/06/2017   Left Achilles tendinitis 11/08/2016   Conductive hearing loss, bilateral 08/08/2016   Elevated lipids 12/16/2012   HTN (hypertension) 01/05/2011   CHF (congestive heart failure) (HCC) 10/30/2010   Edema 10/30/2010   Gouty arthropathy 03/17/2008   OVERWEIGHT 03/17/2008   ESSENTIAL HYPERTENSION, BENIGN 03/17/2008   Non-ischemic cardiomyopathy (HCC) 03/17/2008   PALPITATIONS, OCCASIONAL 03/17/2008    Past Surgical History:  Procedure Laterality Date   KNEE SURGERY     TONSILLECTOMY      OB History   No obstetric history on file.      Home Medications    Prior to Admission medications   Medication Sig Start Date End Date Taking? Authorizing Provider  acetaminophen (TYLENOL) 500 MG tablet Take 500 mg by mouth every 6 (six) hours as needed for mild pain.   Yes [provider]  allopurinol (ZYLOPRIM) 100 MG tablet TAKE 1 TABLET(100 MG) BY MOUTH DAILY 04/11/22  Yes Johney Maine, MD  amLODipine (NORVASC) 10 MG tablet TAKE 1 TABLET(10 MG) BY MOUTH DAILY 06/05/22  Yes Wendall Stade, MD  B Complex Vitamins (B COMPLEX  100 PO) Take 1 tablet by mouth daily.   Yes [provider]  candesartan (ATACAND) 16 MG tablet Take 1 tablet (16 mg total) by mouth daily. 06/11/22  Yes Wendall Stade, MD  carvedilol (COREG) 6.25 MG tablet TAKE 1 TABLET BY MOUTH TWICE DAILY WITH A MEAL 08/15/22  Yes Asa Lente, Tessa N, PA-C  cyclobenzaprine (FLEXERIL) 10 MG tablet Take 1 tablet (10 mg total) by mouth 2 (two) times daily as needed for muscle spasms. 06/17/22  Yes Alvira Monday, MD  furosemide (LASIX) 20 MG tablet Take 0.5 tablets (10 mg total) by mouth daily. 05/28/22  Yes Wendall Stade, MD  hydrALAZINE (APRESOLINE) 25 MG tablet TAKE 1 TABLET BY MOUTH TWICE DAILY 08/15/22  Yes Wendall Stade, MD  Multiple Vitamin (MULTIVITAMIN) capsule Take 1 capsule by mouth daily.   Yes [provider]  rosuvastatin (CRESTOR) 5 MG tablet Take 1 tablet (5 mg total) by mouth at bedtime. 06/11/22  Yes Wendall Stade, MD  tiZANidine (ZANAFLEX) 4 MG tablet Take 1 tablet (4 mg total) by mouth every 6 (six) hours as needed for muscle spasms. 09/01/21  Yes Molpus, John, MD  amoxicillin-clavulanate (AUGMENTIN) 875-125 MG tablet Take 1 tablet by mouth every 12 (twelve) hours. 07/13/22   White, Elita Boone, NP  lidocaine (XYLOCAINE) 2 % solution Use as directed 15 mLs in the mouth or throat  every 3 (three) hours as needed for mouth pain (Sore throat). 01/04/22   Theadora Rama Scales, PA-C  methylPREDNISolone (MEDROL DOSEPAK) 4 MG TBPK tablet Pain 06/17/22   Alvira Monday, MD  nystatin (MYCOSTATIN) 100000 UNIT/ML suspension Take 5 mLs (500,000 Units total) by mouth 4 (four) times daily. 09/17/21   Johney Maine, MD  oxyCODONE (ROXICODONE) 5 MG immediate release tablet Take 1 tablet (5 mg total) by mouth every 4 (four) hours as needed for severe pain. 06/17/22   Alvira Monday, MD    Family History Family History  Problem Relation Age of Onset   Hypertension Mother    Breast cancer Neg Hx     Social History Social History    Tobacco Use   Smoking status: Former    Types: Cigarettes    Quit date: 02/19/1984    Years since quitting: 38.5   Smokeless tobacco: Never  Vaping Use   Vaping Use: Never used  Substance Use Topics   Alcohol use: Not Currently   Drug use: Never     Allergies   Sulfa antibiotics, Codeine, and Simvastatin   Review of Systems Review of Systems  Constitutional:  Negative for appetite change and fever.  Gastrointestinal:  Negative for abdominal pain, diarrhea, nausea and vomiting.  Genitourinary:  Positive for dysuria, frequency and urgency.  Musculoskeletal:  Negative for back pain.  All other systems reviewed and are negative.    Physical Exam Triage Vital Signs ED Triage Vitals  Enc Vitals Group     BP 08/17/22 1404 119/76     Pulse Rate 08/17/22 1404 63     Resp 08/17/22 1404 16     Temp 08/17/22 1404 98.2 F (36.8 C)     Temp Source 08/17/22 1404 Oral     SpO2 08/17/22 1404 98 %     Weight 08/17/22 1403 195 lb (88.5 kg)     Height 08/17/22 1403 5\' 6"  (1.676 m)     Head Circumference --      Peak Flow --      Pain Score 08/17/22 1403 3     Pain Loc --      Pain Edu? --      Excl. in GC? --    No data found.  Updated Vital Signs BP 119/76 (BP Location: Left Arm)   Pulse 63   Temp 98.2 F (36.8 C) (Oral)   Resp 16   Ht 5\' 6"  (1.676 m)   Wt 195 lb (88.5 kg)   SpO2 98%   BMI 31.47 kg/m   Visual Acuity Right Eye Distance:   Left Eye Distance:   Bilateral Distance:    Right Eye Near:   Left Eye Near:    Bilateral Near:     Physical Exam Vitals and nursing note reviewed.  Constitutional:      Appearance: She is well-developed and well-groomed.  HENT:     Head: Normocephalic.  Cardiovascular:     Rate and Rhythm: Normal rate and regular rhythm.     Pulses: Normal pulses.     Heart sounds: Normal heart sounds.  Pulmonary:     Effort: Pulmonary effort is normal.  Neurological:     General: No focal deficit present.     Mental Status: She is  alert and oriented to person, place, and time.     GCS: GCS eye subscore is 4. GCS verbal subscore is 5. GCS motor subscore is 6.  Psychiatric:        Attention  and Perception: Attention normal.        Mood and Affect: Mood normal.        Speech: Speech normal.        Behavior: Behavior normal. Behavior is cooperative.      UC Treatments / Results  Labs (all labs ordered are listed, but only abnormal results are displayed) Labs Reviewed  POCT URINALYSIS DIP (MANUAL ENTRY)    EKG   Radiology No results found.  Procedures Procedures (including critical care time)  Medications Ordered in UC Medications - No data to display  Initial Impression / Assessment and Plan / UC Course  I have reviewed the triage vital signs and the nursing notes.  Pertinent labs & imaging results that were available during my care of the patient were reviewed by me and considered in my medical decision making (see chart for details).     Ddx: Interstitial cystitis, dysuria, uti Final Clinical Impressions(s) / UC Diagnoses   Final diagnoses:  Interstitial cystitis     Discharge Instructions      Your urine was negative for uti. Please drink plenty of water, avoid caffeine(tea,coffee, cola) as it can make symptoms worse. Follow up with PCP/urologist if symptoms persist-call for appt with PCP next week. Return as needed.   If you develop fever, nausea,vomiting, worsening sympotms go to ER for further evaluation.     ED Prescriptions   None    PDMP not reviewed this encounter.   Clancy Gourd, NP 08/17/22 [phone]

## 2022-08-17 NOTE — ED Triage Notes (Signed)
Patient here today with c/o urinary frequency and burning in urination since yesterday.

## 2022-08-17 NOTE — Discharge Instructions (Addendum)
Your urine was negative for uti. Please drink plenty of water, avoid caffeine(tea,coffee, cola) as it can make symptoms worse. Follow up with PCP/urologist if symptoms persist-call for appt with PCP next week. Return as needed.   If you develop fever, nausea,vomiting, worsening sympotms go to ER for further evaluation.

## 2022-08-20 DIAGNOSIS — M7581 Other shoulder lesions, right shoulder: Secondary | ICD-10-CM | POA: Diagnosis not present

## 2022-09-09 DIAGNOSIS — D469 Myelodysplastic syndrome, unspecified: Secondary | ICD-10-CM | POA: Diagnosis not present

## 2022-09-10 ENCOUNTER — Other Ambulatory Visit: Payer: Self-pay | Admitting: Cardiovascular Disease

## 2022-09-23 DIAGNOSIS — D469 Myelodysplastic syndrome, unspecified: Secondary | ICD-10-CM | POA: Diagnosis not present

## 2022-10-01 ENCOUNTER — Other Ambulatory Visit: Payer: Self-pay | Admitting: Cardiovascular Disease

## 2022-10-02 ENCOUNTER — Other Ambulatory Visit: Payer: Self-pay | Admitting: Cardiovascular Disease

## 2022-10-30 DIAGNOSIS — Z0000 Encounter for general adult medical examination without abnormal findings: Secondary | ICD-10-CM | POA: Diagnosis not present

## 2022-10-30 DIAGNOSIS — D61818 Other pancytopenia: Secondary | ICD-10-CM | POA: Diagnosis not present

## 2022-10-30 DIAGNOSIS — M109 Gout, unspecified: Secondary | ICD-10-CM | POA: Diagnosis not present

## 2022-10-30 DIAGNOSIS — E782 Mixed hyperlipidemia: Secondary | ICD-10-CM | POA: Diagnosis not present

## 2022-10-30 DIAGNOSIS — Z23 Encounter for immunization: Secondary | ICD-10-CM | POA: Diagnosis not present

## 2022-10-30 DIAGNOSIS — E669 Obesity, unspecified: Secondary | ICD-10-CM | POA: Diagnosis not present

## 2022-10-30 DIAGNOSIS — I10 Essential (primary) hypertension: Secondary | ICD-10-CM | POA: Diagnosis not present

## 2022-10-30 DIAGNOSIS — E559 Vitamin D deficiency, unspecified: Secondary | ICD-10-CM | POA: Diagnosis not present

## 2022-10-30 DIAGNOSIS — D469 Myelodysplastic syndrome, unspecified: Secondary | ICD-10-CM | POA: Diagnosis not present

## 2022-10-30 DIAGNOSIS — Z1331 Encounter for screening for depression: Secondary | ICD-10-CM | POA: Diagnosis not present

## 2022-11-10 ENCOUNTER — Other Ambulatory Visit: Payer: Self-pay | Admitting: Physician Assistant

## 2022-11-10 ENCOUNTER — Other Ambulatory Visit: Payer: Self-pay | Admitting: Cardiovascular Disease

## 2022-11-11 ENCOUNTER — Other Ambulatory Visit: Payer: Self-pay

## 2022-11-11 ENCOUNTER — Inpatient Hospital Stay: Payer: No Typology Code available for payment source | Attending: Hematology

## 2022-11-11 ENCOUNTER — Inpatient Hospital Stay (HOSPITAL_BASED_OUTPATIENT_CLINIC_OR_DEPARTMENT_OTHER): Payer: No Typology Code available for payment source | Admitting: Hematology

## 2022-11-11 VITALS — BP 138/79 | HR 85 | Temp 97.5°F | Resp 18 | Ht 66.0 in | Wt 197.3 lb

## 2022-11-11 DIAGNOSIS — D469 Myelodysplastic syndrome, unspecified: Secondary | ICD-10-CM

## 2022-11-11 DIAGNOSIS — Z87891 Personal history of nicotine dependence: Secondary | ICD-10-CM | POA: Insufficient documentation

## 2022-11-11 DIAGNOSIS — D61818 Other pancytopenia: Secondary | ICD-10-CM | POA: Insufficient documentation

## 2022-11-11 LAB — CMP (CANCER CENTER ONLY)
ALT: 16 U/L (ref 0–44)
AST: 19 U/L (ref 15–41)
Albumin: 4.2 g/dL (ref 3.5–5.0)
Alkaline Phosphatase: 74 U/L (ref 38–126)
Anion gap: 7 (ref 5–15)
BUN: 16 mg/dL (ref 8–23)
CO2: 24 mmol/L (ref 22–32)
Calcium: 9.6 mg/dL (ref 8.9–10.3)
Chloride: 109 mmol/L (ref 98–111)
Creatinine: 1.01 mg/dL — ABNORMAL HIGH (ref 0.44–1.00)
GFR, Estimated: >60 mL/min (ref >60)
Glucose, Bld: 112 mg/dL — ABNORMAL HIGH (ref 70–99)
Potassium: 4 mmol/L (ref 3.5–5.1)
Sodium: 140 mmol/L (ref 135–145)
Total Bilirubin: 0.6 mg/dL (ref 0.3–1.2)
Total Protein: 7.1 g/dL (ref 6.5–8.1)

## 2022-11-11 LAB — CBC WITH DIFFERENTIAL (CANCER CENTER ONLY)
Abs Immature Granulocytes: 0.02 10*3/uL (ref 0.00–0.07)
Basophils Absolute: 0 10*3/uL (ref 0.0–0.1)
Basophils Relative: 1 %
Eosinophils Absolute: 0 10*3/uL (ref 0.0–0.5)
Eosinophils Relative: 1 %
HCT: 29.3 % — ABNORMAL LOW (ref 36.0–46.0)
Hemoglobin: 9.6 g/dL — ABNORMAL LOW (ref 12.0–15.0)
Immature Granulocytes: 1 %
Lymphocytes Relative: 59 %
Lymphs Abs: 1.3 10*3/uL (ref 0.7–4.0)
MCH: 31.3 pg (ref 26.0–34.0)
MCHC: 32.8 g/dL (ref 30.0–36.0)
MCV: 95.4 fL (ref 80.0–100.0)
Monocytes Absolute: 0.3 10*3/uL (ref 0.1–1.0)
Monocytes Relative: 12 %
Neutro Abs: 0.6 10*3/uL — ABNORMAL LOW (ref 1.7–7.7)
Neutrophils Relative %: 26 %
Platelet Count: 63 10*3/uL — ABNORMAL LOW (ref 150–400)
RBC: 3.07 MIL/uL — ABNORMAL LOW (ref 3.87–5.11)
RDW: 18 % — ABNORMAL HIGH (ref 11.5–15.5)
WBC Count: 2.2 10*3/uL — ABNORMAL LOW (ref 4.0–10.5)
nRBC: 2.3 % — ABNORMAL HIGH (ref 0.0–0.2)

## 2022-11-11 LAB — FERRITIN: Ferritin: 45 ng/mL (ref 11–307)

## 2022-11-11 LAB — VITAMIN B12: Vitamin B-12: 3749 pg/mL — ABNORMAL HIGH (ref 180–914)

## 2022-11-11 LAB — LACTATE DEHYDROGENASE: LDH: 202 U/L — ABNORMAL HIGH (ref 98–192)

## 2022-11-11 LAB — IRON AND IRON BINDING CAPACITY (CC-WL,HP ONLY)
Iron: 55 ug/dL (ref 28–170)
Saturation Ratios: 17 % (ref 10.4–31.8)
TIBC: 333 ug/dL (ref 250–450)
UIBC: 278 ug/dL (ref 148–442)

## 2022-11-11 NOTE — Progress Notes (Signed)
HEMATOLOGY/ONCOLOGY CLINIC NOTE  Date of Service: 11/11/22    Patient Care Team: Merri Brunette, MD as PCP - General (Family Medicine) Wendall Stade, MD as PCP - Cardiology (Cardiology)  CHIEF COMPLAINTS/PURPOSE OF CONSULTATION:  Follow-up for continued evaluation and management of MDS with MF   HISTORY OF PRESENTING ILLNESS:  Please see previous note for details on initial presentation  Interval History:   Karen Waller is a 67 y.o. female who is here for continued evaluation and management of her MDS with myelofibrosis and associated cytopenias.   Patient was last seen by me on 08/09/2022 and she complained of left rotator cuff issues and fatigue.   Patient is accompanied by her daughters during this visit. She notes she has been doing well overall since our last visit. She does complain of mild occasional fatigue. She reports of one episode of bleeding when she had her recent bone marrow biopsy.   Patient recently had a visit with Dr. Dimas Aguas and had bone marrow biopsy, which did not show any progression. Patient has had conversation with Dr. Dimas Aguas who suggested HMA treatment due to low platelet counts or bone marrow transplant. Patient did not decide during her visit with Dr. Dimas Aguas. Her next visit with Dr. Dimas Aguas is in November.   She denies any new infection issues, fever, chills, night sweats, bone pain, back pain, chest pain, or leg swelling.   Patient has recently received her influenza vaccine and Pneumonia vaccine. She denies COVID-19 Booster or RSV vaccine.   She had influenza vaccine last week and had slight reaction of mild fever.    MEDICAL HISTORY:  Past Medical History:  Diagnosis Date   Cardiomyopathy    EF 30-35% diagnosed 02/2008   Central obesity    Gout    left knee   HTN (hypertension)    Pancytopenia (HCC)     SURGICAL HISTORY: Past Surgical History:  Procedure Laterality Date   KNEE SURGERY     TONSILLECTOMY      SOCIAL  HISTORY: Social History   Socioeconomic History   Marital status: Married    Spouse name: Not on file   Number of children: 2   Years of education: Not on file   Highest education level: Not on file  Occupational History   Occupation: [employer]  Tobacco Use   Smoking status: Former    Current packs/day: 0.00    Types: Cigarettes    Quit date: 02/19/1984    Years since quitting: 38.7   Smokeless tobacco: Never  Vaping Use   Vaping status: Never Used  Substance and Sexual Activity   Alcohol use: Not Currently   Drug use: Never   Sexual activity: Not on file  Other Topics Concern   Not on file  Social History Narrative   Not on file   Social Determinants of Health   Financial Resource Strain: Not on file  Food Insecurity: Not on file  Transportation Needs: Not on file  Physical Activity: Not on file  Stress: Not on file  Social Connections: Not on file  Intimate Partner Violence: Not on file    FAMILY HISTORY: Family History  Problem Relation Age of Onset   Hypertension Mother    Breast cancer Neg Hx     ALLERGIES:  is allergic to sulfa antibiotics, codeine, and simvastatin.  MEDICATIONS:  Current Outpatient Medications  Medication Sig Dispense Refill   acetaminophen (TYLENOL) 500 MG tablet Take 500 mg by mouth every 6 (six) hours  as needed for mild pain.     allopurinol (ZYLOPRIM) 100 MG tablet TAKE 1 TABLET(100 MG) BY MOUTH DAILY 90 tablet 1   amLODipine (NORVASC) 10 MG tablet TAKE 1 TABLET(10 MG) BY MOUTH DAILY 90 tablet 0   amoxicillin-clavulanate (AUGMENTIN) 875-125 MG tablet Take 1 tablet by mouth every 12 (twelve) hours. 14 tablet 0   B Complex Vitamins (B COMPLEX 100 PO) Take 1 tablet by mouth daily.     candesartan (ATACAND) 16 MG tablet Take 1 tablet (16 mg total) by mouth daily. 90 tablet 0   carvedilol (COREG) 6.25 MG tablet TAKE 1 TABLET BY MOUTH TWICE DAILY WITH A MEAL 180 tablet 0   cyclobenzaprine (FLEXERIL) 10 MG tablet Take 1 tablet (10 mg total)  by mouth 2 (two) times daily as needed for muscle spasms. 20 tablet 0   furosemide (LASIX) 20 MG tablet Take 0.5 tablets (10 mg total) by mouth daily. 45 tablet 1   hydrALAZINE (APRESOLINE) 25 MG tablet TAKE 1 TABLET BY MOUTH TWICE DAILY 180 tablet 0   lidocaine (XYLOCAINE) 2 % solution Use as directed 15 mLs in the mouth or throat every 3 (three) hours as needed for mouth pain (Sore throat). 300 mL 0   methylPREDNISolone (MEDROL DOSEPAK) 4 MG TBPK tablet Pain 21 each 0   Multiple Vitamin (MULTIVITAMIN) capsule Take 1 capsule by mouth daily.     nystatin (MYCOSTATIN) 100000 UNIT/ML suspension Take 5 mLs (500,000 Units total) by mouth 4 (four) times daily. 200 mL 0   oxyCODONE (ROXICODONE) 5 MG immediate release tablet Take 1 tablet (5 mg total) by mouth every 4 (four) hours as needed for severe pain. 10 tablet 0   rosuvastatin (CRESTOR) 5 MG tablet Take 1 tablet (5 mg total) by mouth at bedtime. 90 tablet 0   tiZANidine (ZANAFLEX) 4 MG tablet Take 1 tablet (4 mg total) by mouth every 6 (six) hours as needed for muscle spasms. 30 tablet 0   No current facility-administered medications for this visit.    REVIEW OF SYSTEMS:    10 Point review of Systems was done is negative except as noted above.   PHYSICAL EXAMINATION:  There were no vitals filed for this visit.   There were no vitals filed for this visit.  .There is no height or weight on file to calculate BMI.   GENERAL:alert, in no acute distress and comfortable SKIN: no acute rashes, no significant lesions EYES: conjunctiva are pink and non-injected, sclera anicteric OROPHARYNX: MMM, no exudates, no oropharyngeal erythema or ulceration NECK: supple, no JVD LYMPH:  no palpable lymphadenopathy in the cervical, axillary or inguinal regions LUNGS: clear to auscultation b/l with normal respiratory effort HEART: regular rate & rhythm ABDOMEN:  normoactive bowel sounds , non tender, not distended. Extremity: no pedal edema PSYCH:  alert & oriented x 3 with fluent speech NEURO: no focal motor/sensory deficits   LABORATORY DATA:  I have reviewed the data as listed .    Latest Ref Rng & Units 08/09/2022   12:39 PM 06/17/2022    9:57 AM 05/10/2022   12:50 PM  CBC  WBC 4.0 - 10.5 K/uL 2.0  2.8  2.2   Hemoglobin 12.0 - 15.0 g/dL 9.5  9.8  9.7   Hematocrit 36.0 - 46.0 % 30.2  30.9  29.2   Platelets 150 - 400 K/uL 61  57  64    ANC 500     Latest Ref Rng & Units 08/09/2022   12:39 PM 06/17/2022  9:57 AM 05/10/2022   12:50 PM  CMP  Glucose 70 - 99 mg/dL 161  096  045   BUN 8 - 23 mg/dL 15  16  17    Creatinine 0.44 - 1.00 mg/dL 4.09  8.11  9.14   Sodium 135 - 145 mmol/L 140  137  143   Potassium 3.5 - 5.1 mmol/L 3.8  3.8  4.6   Chloride 98 - 111 mmol/L 108  105  110   CO2 22 - 32 mmol/L 26  22  26    Calcium 8.9 - 10.3 mg/dL 9.3  9.1  9.7   Total Protein 6.5 - 8.1 g/dL 6.4  7.4  6.9   Total Bilirubin 0.3 - 1.2 mg/dL 0.8  0.9  0.7   Alkaline Phos 38 - 126 U/L 66  82  70   AST 15 - 41 U/L 15  40  15   ALT 0 - 44 U/L 12  45  11     11/26/2018 BM Flow Pathology Report    11/26/2018 BM Bx Report    Surgical Pathology  CASE: [id number]  PATIENT: Elois Torosyan  Bone Marrow Report    Clinical History: pancytopenia, paraproteinemia, right iliac bone      DIAGNOSIS:   BONE MARROW, ASPIRATE, CLOT, CORE:  -Hypercellular bone marrow for age with trilineage hematopoiesis and  fibrosis.  See comment   PERIPHERAL BLOOD:  -Pancytopenia   COMMENT:   The aspirate, touch imprints, and clot sections are suboptimal which  hinders cytomorphologic evaluation.  The core biopsy is optimal and  shows a hypercellular bone marrow (90%) with a mixture of myeloid cell  lines.  This is associated with reticulin fibrosis.  A significant  CD34-positive blast population is not seen.  The myeloid changes are not  considered specific, and hence, it is unclear whether the findings  represent a primary myeloid  neoplasm or represent secondary changes.  Correlation with cytogenetic studies is recommended.  There is no  evidence of lymphoproliferative process or diagnostic plasma cell  neoplasm.    11/26/2018 [facility] Cytogenetics:    RADIOGRAPHIC STUDIES: I have personally reviewed the radiological images as listed and agreed with the findings in the report. No results found.   ASSESSMENT & PLAN:   67 y.o. female with  1. Minor Spontaneous?/undetected trauma causing bruising No ecchymosis with bruising  Clotting parameters normal from 06/17/17 with normal PT, aPTT and VWD panel and platelet function testing 01/08/18 PT and aPTT, fibrinogen WNL  2. MDS/MPN with fibrosis -- causing Pancytopenia --  -Previous bone marrow biopsy was done in October 2020 showing possible MDS/MPN overlap versus MDS with fibrosis.  No increase in blasts cytogenetics with deletion 20 q. NGS with only CBL mutation.  Oct 2020 BM Bx - hypercellular bone marrow with some reticulin fibrosis. 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 NGS molecular testing showed CBL mutation   PLAN: -Discussed lab results from today, 11/11/2022, in detail with the patient. CBC shows decreased WBC at 2.2 K, decreased but stable hemoglobin at 9.6 g/dL, decreased hematocrit at 29.3%, and decreased platelets at 63 K. CMP is pending.  -Educated the patient on bone marrow transplant and HMA chemotherapy treatment.  -Educated the patient about chemotherapy treatment, Vidaza, in detail. Discussed the length of the treatment.  -Answered all of patient's and her family members' questions regarding Vidaza treatment and other general questions.  -Patient wants to talk to her family members regarding chemotherapy or bone marrow transplant. She  will decide on either starting Vidaza or bone marrow transplant and call us with her decision.  -Recommend COVID-19 Booster, RSV vaccine, and other age related vaccines.  -no significant  need for immediate transplant at this time. -discussed option of Vidaza in leu of or as a bridge to receiving a bone marrow transplant -Patient continues to follow up with Dr. Dimas Aguas regularly with alternating visits  FOLLOW-UP: RTC with Dr Candise Che with labs in 2 months  The total time spent in the appointment was *** minutes* .  All of the patient's questions were answered with apparent satisfaction. The patient knows to call the clinic with any problems, questions or concerns.   Wyvonnia Lora MD MS AAHIVMS [facility] Hematology/Oncology Physician [facility]  .*Total Encounter Time as defined by the Centers for Medicare and Medicaid Services includes, in addition to the face-to-face time of a patient visit (documented in the note above) non-face-to-face time: obtaining and reviewing outside history, ordering and reviewing medications, tests or procedures, care coordination (communications with other health care professionals or caregivers) and documentation in the medical record.   I,Param Shah,acting as a Neurosurgeon for Wyvonnia Lora, MD.,have documented all relevant documentation on the behalf of Wyvonnia Lora, MD,as directed by  Wyvonnia Lora, MD while in the presence of Wyvonnia Lora, MD.

## 2022-11-12 ENCOUNTER — Other Ambulatory Visit: Payer: Self-pay | Admitting: Cardiovascular Disease

## 2022-11-12 DIAGNOSIS — R399 Unspecified symptoms and signs involving the genitourinary system: Secondary | ICD-10-CM | POA: Diagnosis not present

## 2022-11-12 DIAGNOSIS — R350 Frequency of micturition: Secondary | ICD-10-CM | POA: Diagnosis not present

## 2022-11-14 ENCOUNTER — Encounter: Payer: Self-pay | Admitting: Hematology

## 2022-11-21 ENCOUNTER — Other Ambulatory Visit: Payer: Self-pay | Admitting: Family Medicine

## 2022-11-21 DIAGNOSIS — Z1231 Encounter for screening mammogram for malignant neoplasm of breast: Secondary | ICD-10-CM

## 2022-12-03 DIAGNOSIS — E669 Obesity, unspecified: Secondary | ICD-10-CM | POA: Diagnosis not present

## 2022-12-03 DIAGNOSIS — Z6833 Body mass index (BMI) 33.0-33.9, adult: Secondary | ICD-10-CM | POA: Diagnosis not present

## 2022-12-03 DIAGNOSIS — M109 Gout, unspecified: Secondary | ICD-10-CM | POA: Diagnosis not present

## 2022-12-03 DIAGNOSIS — E782 Mixed hyperlipidemia: Secondary | ICD-10-CM | POA: Diagnosis not present

## 2022-12-03 DIAGNOSIS — I10 Essential (primary) hypertension: Secondary | ICD-10-CM | POA: Diagnosis not present

## 2022-12-09 ENCOUNTER — Other Ambulatory Visit: Payer: Self-pay | Admitting: Cardiovascular Disease

## 2022-12-10 ENCOUNTER — Other Ambulatory Visit: Payer: Self-pay | Admitting: Cardiovascular Disease

## 2022-12-10 ENCOUNTER — Other Ambulatory Visit: Payer: Self-pay | Admitting: Physician Assistant

## 2022-12-10 DIAGNOSIS — Z6830 Body mass index (BMI) 30.0-30.9, adult: Secondary | ICD-10-CM | POA: Diagnosis not present

## 2022-12-10 DIAGNOSIS — N182 Chronic kidney disease, stage 2 (mild): Secondary | ICD-10-CM | POA: Diagnosis not present

## 2022-12-10 DIAGNOSIS — I130 Hypertensive heart and chronic kidney disease with heart failure and stage 1 through stage 4 chronic kidney disease, or unspecified chronic kidney disease: Secondary | ICD-10-CM | POA: Diagnosis not present

## 2022-12-10 DIAGNOSIS — Z008 Encounter for other general examination: Secondary | ICD-10-CM | POA: Diagnosis not present

## 2022-12-10 DIAGNOSIS — R002 Palpitations: Secondary | ICD-10-CM | POA: Diagnosis not present

## 2022-12-10 DIAGNOSIS — I700 Atherosclerosis of aorta: Secondary | ICD-10-CM | POA: Diagnosis not present

## 2022-12-10 DIAGNOSIS — E669 Obesity, unspecified: Secondary | ICD-10-CM | POA: Diagnosis not present

## 2022-12-10 DIAGNOSIS — D696 Thrombocytopenia, unspecified: Secondary | ICD-10-CM | POA: Diagnosis not present

## 2022-12-10 DIAGNOSIS — E785 Hyperlipidemia, unspecified: Secondary | ICD-10-CM | POA: Diagnosis not present

## 2022-12-10 DIAGNOSIS — D469 Myelodysplastic syndrome, unspecified: Secondary | ICD-10-CM | POA: Diagnosis not present

## 2022-12-11 DIAGNOSIS — H35361 Drusen (degenerative) of macula, right eye: Secondary | ICD-10-CM | POA: Diagnosis not present

## 2022-12-12 ENCOUNTER — Encounter: Payer: Self-pay | Admitting: Hematology

## 2022-12-12 ENCOUNTER — Ambulatory Visit
Admission: RE | Admit: 2022-12-12 | Discharge: 2022-12-12 | Disposition: A | Discharge status: Home / Self Care | Payer: No Typology Code available for payment source | Source: Ambulatory Visit | Attending: Family Medicine | Admitting: Family Medicine

## 2022-12-12 DIAGNOSIS — Z1231 Encounter for screening mammogram for malignant neoplasm of breast: Secondary | ICD-10-CM | POA: Diagnosis not present

## 2022-12-16 DIAGNOSIS — J3489 Other specified disorders of nose and nasal sinuses: Secondary | ICD-10-CM | POA: Diagnosis not present

## 2022-12-19 ENCOUNTER — Ambulatory Visit: Payer: No Typology Code available for payment source | Attending: Physician Assistant | Admitting: Physician Assistant

## 2022-12-19 ENCOUNTER — Encounter: Payer: Self-pay | Admitting: Physician Assistant

## 2022-12-19 VITALS — BP 122/76 | HR 63 | Ht 66.0 in | Wt 196.0 lb

## 2022-12-19 DIAGNOSIS — E785 Hyperlipidemia, unspecified: Secondary | ICD-10-CM | POA: Diagnosis not present

## 2022-12-19 DIAGNOSIS — R002 Palpitations: Secondary | ICD-10-CM

## 2022-12-19 DIAGNOSIS — R072 Precordial pain: Secondary | ICD-10-CM | POA: Diagnosis not present

## 2022-12-19 DIAGNOSIS — R011 Cardiac murmur, unspecified: Secondary | ICD-10-CM | POA: Diagnosis not present

## 2022-12-19 DIAGNOSIS — I10 Essential (primary) hypertension: Secondary | ICD-10-CM | POA: Diagnosis not present

## 2022-12-19 DIAGNOSIS — I420 Dilated cardiomyopathy: Secondary | ICD-10-CM

## 2022-12-19 DIAGNOSIS — D61818 Other pancytopenia: Secondary | ICD-10-CM | POA: Diagnosis not present

## 2022-12-19 MED ORDER — AMLODIPINE BESYLATE 10 MG PO TABS: ORAL_TABLET | ORAL | 3 refills | Status: DC

## 2022-12-19 MED ORDER — CANDESARTAN CILEXETIL 16 MG PO TABS: 16.0000 mg | ORAL_TABLET | Freq: Every day | ORAL | 3 refills | Status: AC

## 2022-12-19 MED ORDER — HYDRALAZINE HCL 25 MG PO TABS: 25.0000 mg | ORAL_TABLET | Freq: Two times a day (BID) | ORAL | 3 refills | Status: DC

## 2022-12-19 MED ORDER — ROSUVASTATIN CALCIUM 5 MG PO TABS: 5.0000 mg | ORAL_TABLET | Freq: Every day | ORAL | 3 refills | Status: AC

## 2022-12-19 MED ORDER — FUROSEMIDE 20 MG PO TABS: 20.0000 mg | ORAL_TABLET | Freq: Every day | ORAL | 3 refills | Status: AC

## 2022-12-19 NOTE — Progress Notes (Addendum)
Office Visit    Patient Name: Karen Waller Date of Encounter: 12/19/2022  PCP:  Merri Brunette, MD   [facility]  Cardiologist:  Charlton Haws, MD  Advanced Practice Provider:  No care team member to display Electrophysiologist:  None   HPI    Karen Waller is a 67 y.o. female with a hx of palpitations, hypertension, DCM, and hyperlipidemia presents today for annual follow-up appointment.  Patient was last seen February 2022 and at that time she was tolerating her beta-blocker and ARB with no palpitations.  She was compliant with her medications.  She had an MRI back on 01/13/2013 with an EF of 47%, no scar.  TTE was performed 06/2018 with an EF of 55 to 60%, normal valves.  June 2020 monitor showed no PAF some atrial and ventricular ectopy but less than 1% beats and did not correlate with any of her symptoms.  She sees her hematologist regularly for pancytopenia.  At that time her blood pressure was well controlled as well as her palpitations.  She appeared euvolemic on exam.  Since her EF was normal no need for Entresto.  Most recent echocardiogram 01/2021 with EF 50 to 55%.  I saw her in March 2023,, she states that she is having some fluttering in her chest which is also associated with a sharp fleeting pain in her chest.  It is not associated with exercise.  She also feels her heartbeat in her ear and it is sometimes accompanied by a whooshing sound.  This is happening every day and has been occurring since December.  She also states she cannot lay on her left side because she gets a headache.  Other than that she has been doing well since we have last seen her about a year ago.  She was seen by Dr. Eden Emms 09/10/2021 and at that time she was having some SSCP which seem to be related to taking prednisone.  Resting/stress SSCP can last hours and not always exertional.   Today, she presents with a history of palpitations and chest pain, reports that the  palpitations are still present but have become less concerning over time. She attribute this to a better understanding of the condition and its non-threatening nature. The patient also mentions a recent stress test and a PET CT scan conducted a year ago, which showed some calcium in the coronary arteries. As a result, the patient has been on rosuvastatin, which has effectively controlled their LDL levels.  She also discusses potential upcoming treatments, including a bone marrow transplant and chemotherapy. She  express concern about the side effects of prednisone, which they may need to take for foot pain. The patient has previously experienced increased heart pressure when on prednisone. She  also mention a history of gout, but are unsure if the current foot pain is related to it.  Reports no shortness of breath nor dyspnea on exertion. Reports no chest pain, pressure, or tightness. No edema, orthopnea, PND.    AI recording device used during encounter with patient's permission.  Past Medical History    Past Medical History:  Diagnosis Date   Cardiomyopathy    EF 30-35% diagnosed 02/2008   Central obesity    Gout    left knee   HTN (hypertension)    Pancytopenia (HCC)    Past Surgical History:  Procedure Laterality Date   KNEE SURGERY     TONSILLECTOMY      Allergies  Allergies  Allergen  Reactions   Sulfa Antibiotics Shortness Of Breath   Codeine Other (See Comments)    The patient stats she has oliguria with it    Simvastatin     Leg heaviness     EKGs/Labs/Other Studies Reviewed:   The following studies were reviewed today:  Echocardiogram 02/13/2021 IMPRESSIONS     1. Mild anterior and basal to mid anteroseptal hypokinesis. Left  ventricular ejection fraction, by estimation, is 50 to 55%. The left  ventricle has low normal function. The left ventricle demonstrates  regional wall motion abnormalities (see scoring  diagram/findings for description). Left  ventricular diastolic parameters  are consistent with Grade I diastolic dysfunction (impaired relaxation).  The average left ventricular global longitudinal strain is -18.4 %. The  global longitudinal strain is  normal.   2. Right ventricular systolic function is normal. The right ventricular  size is normal. There is normal pulmonary artery systolic pressure.   3. The mitral valve is normal in structure. Trivial mitral valve  regurgitation. No evidence of mitral stenosis.   4. The aortic valve is tricuspid. Aortic valve regurgitation is not  visualized. No aortic stenosis is present.   5. The inferior vena cava is normal in size with greater than 50%  respiratory variability, suggesting right atrial pressure of 3 mmHg.   LABS LDL: 44 (10/2022) HDL: 37 (10/2022) Triglycerides: 146 (10/2022)  RADIOLOGY PET CT: Low risk, coronary calcium noted (Fall 2023)  DIAGNOSTIC Zio monitor: 1% occurrence of palpitations (05/2021)   Recent Labs: 11/11/2022: ALT 16; BUN 16; Creatinine 1.01; Hemoglobin 9.6; Platelet Count 63; Potassium 4.0; Sodium 140  Recent Lipid Panel    Component Value Date/Time   CHOL 108 03/11/2022 0855   TRIG 87 03/11/2022 0855   HDL 42 03/11/2022 0855   CHOLHDL 2.6 03/11/2022 0855   LDLCALC 49 03/11/2022 0855     Home Medications   Current Meds  Medication Sig   acetaminophen (TYLENOL) 500 MG tablet Take 500 mg by mouth every 6 (six) hours as needed for mild pain.   allopurinol (ZYLOPRIM) 100 MG tablet TAKE 1 TABLET(100 MG) BY MOUTH DAILY   B Complex Vitamins (B COMPLEX 100 PO) Take 1 tablet by mouth daily.   carvedilol (COREG) 6.25 MG tablet TAKE 1 TABLET BY MOUTH TWICE DAILY WITH A MEAL   Cholecalciferol 125 MCG (5000 UT) capsule Take 5,000 Units by mouth daily.   cyclobenzaprine (FLEXERIL) 10 MG tablet Take 1 tablet (10 mg total) by mouth 2 (two) times daily as needed for muscle spasms.   lidocaine (XYLOCAINE) 2 % solution Use as directed 15 mLs in the  mouth or throat every 3 (three) hours as needed for mouth pain (Sore throat).   methocarbamol (ROBAXIN) 500 MG tablet Take 500 mg by mouth as needed for muscle spasms.   Multiple Vitamin (MULTIVITAMIN) capsule Take 1 capsule by mouth daily.   tiZANidine (ZANAFLEX) 4 MG tablet Take 1 tablet (4 mg total) by mouth every 6 (six) hours as needed for muscle spasms.   traMADol (ULTRAM) 50 MG tablet Take 50 mg by mouth as needed.   [DISCONTINUED] amLODipine (NORVASC) 10 MG tablet TAKE 1 TABLET(10 MG) BY MOUTH DAILY   [DISCONTINUED] candesartan (ATACAND) 16 MG tablet Take 1 tablet (16 mg total) by mouth daily.   [DISCONTINUED] furosemide (LASIX) 20 MG tablet Take 1 tablet (20 mg total) by mouth daily. Please keep scheduled appointment for future refills. Thank you.   [DISCONTINUED] hydrALAZINE (APRESOLINE) 25 MG tablet Take 1 tablet (25 mg  total) by mouth 2 (two) times daily. Please keep scheduled appointment for future refills. Thank you.   [DISCONTINUED] rosuvastatin (CRESTOR) 5 MG tablet Take 1 tablet (5 mg total) by mouth daily. Patient must attend appointment for further refills     Review of Systems      All other systems reviewed and are otherwise negative except as noted above.  Physical Exam    VS:  BP 122/76   Pulse 63   Ht 5\' 6"  (1.676 m)   Wt 196 lb (88.9 kg)   SpO2 98%   BMI 31.64 kg/m  , BMI Body mass index is 31.64 kg/m.  Wt Readings from Last 3 Encounters:  12/19/22 196 lb (88.9 kg)  11/11/22 197 lb 5 oz (89.5 kg)  08/17/22 195 lb (88.5 kg)     GEN: Well nourished, well developed, in no acute distress. HEENT: normal. Neck: Supple, no JVD, carotid bruits, or masses. Cardiac: RRR, + 1/6 systolic murmur, rubs, or gallops. No clubbing, cyanosis, edema.  Radials/PT 2+ and equal bilaterally.  Respiratory:  Respirations regular and unlabored, clear to auscultation bilaterally. GI: Soft, nontender, nondistended. MS: No deformity or atrophy. Skin: Warm and dry, no rash. Neuro:   Strength and sensation are intact. Psych: Normal affect.  Assessment & Plan    Hypertension -BP well controlled today in the clinic 110/70 -Continue to monitor your blood pressure at home -Continue GDMT: Coreg, Norvasc, Lasix, and hydralazine  Dilated cardiomyopathy -Recent echocardiogram December 2022 showed LVEF 50 to 55%.  No significant valvular disease. -echo from April 2023 reviewed  Hyperlipidemia -Recent LDL 44, HDL 37, triglycerides 146  Coronary Artery Disease Stable with no recent chest pain. Last PET CT a year ago showed low risk. Noted coronary calcium on imaging. LDL controlled at 44 on Rosuvastatin 5mg  daily. -Continue Rosuvastatin 5mg  daily. -Continue lifestyle modifications including diet and exercise.  Palpitations Infrequent palpitations, not associated with any other symptoms. Last Zio monitor in April 2023 showed palpitations occurring 1% of the time. No changes in frequency or severity since then. -Continue current management. -Consider repeat Zio monitor if increase in frequency or severity of palpitations.  Foot Pain New onset, possibly related to recent physical activity. No associated symptoms suggestive of gout. -Advise conservative management including rest, ice, heat, and stretching. -Consider evaluation by primary care or orthopedics if no improvement.  General Health Maintenance -Continue current medications including Lasix, Hydralazine, Amlodipine, and Candesartan. -Refills provided for all current medications. -Plan for potential bone marrow transplant and chemotherapy in the future. Encourage continued physical activity and regular follow-up appointments.  Disposition: She can follow-up in a year  Signed, Sharlene Dory, Cordelia Poche 12/19/2022, 4:36 PM [facility]

## 2022-12-19 NOTE — Patient Instructions (Signed)
Medication Instructions:  Your physician recommends that you continue on your current medications as directed. Please refer to the Current Medication list given to you today. *If you need a refill on your cardiac medications before your next appointment, please call your pharmacy*   Lab Work: None Ordered   Testing/Procedures: None ordered   Follow-Up: At [facility], you and your health needs are our priority.  As part of our continuing mission to provide you with exceptional heart care, we have created designated Provider Care Teams.  These Care Teams include your primary Cardiologist (physician) and Advanced Practice Providers (APPs -  Physician Assistants and Nurse Practitioners) who all work together to provide you with the care you need, when you need it.  We recommend signing up for the patient portal called "MyChart".  Sign up information is provided on this After Visit Summary.  MyChart is used to connect with patients for Virtual Visits (Telemedicine).  Patients are able to view lab/test results, encounter notes, upcoming appointments, etc.  Non-urgent messages can be sent to your provider as well.   To learn more about what you can do with MyChart, go to [email or web address].    Your next appointment:   12 month(s)  Provider:   Charlton Haws, MD  or Jari Favre, PA-C   Other Instructions

## 2022-12-21 ENCOUNTER — Ambulatory Visit (HOSPITAL_COMMUNITY)
Admission: EM | Admit: 2022-12-21 | Discharge: 2022-12-21 | Disposition: A | Discharge status: Home / Self Care | Payer: No Typology Code available for payment source | Attending: Emergency Medicine | Admitting: Emergency Medicine

## 2022-12-21 ENCOUNTER — Encounter (HOSPITAL_COMMUNITY): Payer: Self-pay

## 2022-12-21 DIAGNOSIS — X503XXA Overexertion from repetitive movements, initial encounter: Secondary | ICD-10-CM | POA: Diagnosis not present

## 2022-12-21 DIAGNOSIS — S96912A Strain of unspecified muscle and tendon at ankle and foot level, left foot, initial encounter: Secondary | ICD-10-CM

## 2022-12-21 DIAGNOSIS — M79672 Pain in left foot: Secondary | ICD-10-CM

## 2022-12-21 MED ORDER — PREDNISONE 10 MG PO TABS: 10.0000 mg | ORAL_TABLET | Freq: Every day | ORAL | 0 refills | Status: DC

## 2022-12-21 MED ORDER — ACETAMINOPHEN 500 MG PO TABS: 1000.0000 mg | ORAL_TABLET | Freq: Four times a day (QID) | ORAL | 0 refills | Status: AC | PRN

## 2022-12-21 NOTE — ED Provider Notes (Signed)
[facility]    CSN: [id number] Arrival date & time: 12/21/22  1550      History   Chief Complaint Chief Complaint  Patient presents with   Foot Pain    HPI Karen Waller is a 67 y.o. female.   Patient is reporting pain in left arch of the foot.  She stated she walked all of [place] [employer] and then continue to do her 1 to 2 miles walk daily.  Pain has worsen. No trauma   The history is provided by the patient.  Foot Pain The current episode started more than 1 week ago. The problem occurs constantly. The problem has not changed since onset.The symptoms are aggravated by walking. Nothing relieves the symptoms.    Past Medical History:  Diagnosis Date   Cardiomyopathy    EF 30-35% diagnosed 02/2008   Central obesity    Gout    left knee   HTN (hypertension)    Pancytopenia (HCC)     Patient Active Problem List   Diagnosis Date Noted   Interstitial cystitis 08/17/2022   Iron deficiency anemia 06/22/2019   Pain in left hip 03/24/2018   Bruising, spontaneous 07/06/2017   Left Achilles tendinitis 11/08/2016   Conductive hearing loss, bilateral 08/08/2016   Elevated lipids 12/16/2012   HTN (hypertension) 01/05/2011   CHF (congestive heart failure) (HCC) 10/30/2010   Edema 10/30/2010   Gouty arthropathy 03/17/2008   OVERWEIGHT 03/17/2008   ESSENTIAL HYPERTENSION, BENIGN 03/17/2008   Non-ischemic cardiomyopathy (HCC) 03/17/2008   PALPITATIONS, OCCASIONAL 03/17/2008    Past Surgical History:  Procedure Laterality Date   KNEE SURGERY     TONSILLECTOMY      OB History   No obstetric history on file.      Home Medications    Prior to Admission medications   Medication Sig Start Date End Date Taking? Authorizing Provider  predniSONE (DELTASONE) 10 MG tablet Take 1 tablet (10 mg total) by mouth daily with breakfast. 12/21/22  Yes Lindzee Gouge, Linde Gillis, NP  acetaminophen (TYLENOL) 500 MG tablet Take 2 tablets (1,000 mg total) by mouth every 6 (six) hours  as needed for mild pain (pain score 1-3). 12/21/22   Rosalyn Archambault, Linde Gillis, NP  allopurinol (ZYLOPRIM) 100 MG tablet TAKE 1 TABLET(100 MG) BY MOUTH DAILY 04/11/22   Johney Maine, MD  amLODipine (NORVASC) 10 MG tablet TAKE 1 TABLET(10 MG) BY MOUTH DAILY 12/19/22   Jari Favre N, PA-C  B Complex Vitamins (B COMPLEX 100 PO) Take 1 tablet by mouth daily.    [provider]  candesartan (ATACAND) 16 MG tablet Take 1 tablet (16 mg total) by mouth daily. 12/19/22   Sharlene Dory, PA-C  carvedilol (COREG) 6.25 MG tablet TAKE 1 TABLET BY MOUTH TWICE DAILY WITH A MEAL 12/10/22   Wendall Stade, MD  Cholecalciferol 125 MCG (5000 UT) capsule Take 5,000 Units by mouth daily.    [provider]  cyclobenzaprine (FLEXERIL) 10 MG tablet Take 1 tablet (10 mg total) by mouth 2 (two) times daily as needed for muscle spasms. 06/17/22   Alvira Monday, MD  furosemide (LASIX) 20 MG tablet Take 1 tablet (20 mg total) by mouth daily. 12/19/22   Sharlene Dory, PA-C  hydrALAZINE (APRESOLINE) 25 MG tablet Take 1 tablet (25 mg total) by mouth 2 (two) times daily. 12/19/22   Sharlene Dory, PA-C  lidocaine (XYLOCAINE) 2 % solution Use as directed 15 mLs in the mouth or throat every 3 (three)  hours as needed for mouth pain (Sore throat). 01/04/22   Theadora Rama Scales, PA-C  methocarbamol (ROBAXIN) 500 MG tablet Take 500 mg by mouth as needed for muscle spasms. 06/21/20   [provider]  rosuvastatin (CRESTOR) 5 MG tablet Take 1 tablet (5 mg total) by mouth daily. 12/19/22   Sharlene Dory, PA-C  tiZANidine (ZANAFLEX) 4 MG tablet Take 1 tablet (4 mg total) by mouth every 6 (six) hours as needed for muscle spasms. 09/01/21   Molpus, John, MD  traMADol (ULTRAM) 50 MG tablet Take 50 mg by mouth as needed. 03/23/21   [provider]    Family History Family History  Problem Relation Age of Onset   Hypertension Mother    Breast cancer Neg Hx     Social History Social History    Tobacco Use   Smoking status: Former    Current packs/day: 0.00    Types: Cigarettes    Quit date: 02/19/1984    Years since quitting: 38.9   Smokeless tobacco: Never  Vaping Use   Vaping status: Never Used  Substance Use Topics   Alcohol use: Not Currently   Drug use: Never     Allergies   Sulfa antibiotics, Codeine, and Simvastatin   Review of Systems Review of Systems  Musculoskeletal:  Positive for joint swelling.       Left foot pain over top of foot and through arch  All other systems reviewed and are negative.    Physical Exam Triage Vital Signs ED Triage Vitals  Encounter Vitals Group     BP 12/21/22 1659 120/78     Systolic BP Percentile --      Diastolic BP Percentile --      Pulse Rate 12/21/22 1659 63     Resp --      Temp 12/21/22 1659 97.7 F (36.5 C)     Temp Source 12/21/22 1659 Oral     SpO2 12/21/22 1659 94 %     Weight --      Height --      Head Circumference --      Peak Flow --      Pain Score 12/21/22 1658 8     Pain Loc --      Pain Education --      Exclude from Growth Chart --    No data found.  Updated Vital Signs BP 120/78 (BP Location: Right Arm)   Pulse 63   Temp 97.7 F (36.5 C) (Oral)   SpO2 94%   Visual Acuity Right Eye Distance:   Left Eye Distance:   Bilateral Distance:    Right Eye Near:   Left Eye Near:    Bilateral Near:     Physical Exam Vitals and nursing note reviewed.      UC Treatments / Results  Labs (all labs ordered are listed, but only abnormal results are displayed) Labs Reviewed - No data to display  EKG   Radiology No results found.  Procedures Procedures (including critical care time)  Medications Ordered in UC Medications - No data to display  Initial Impression / Assessment and Plan / UC Course  I have reviewed the triage vital signs and the nursing notes.  Pertinent labs & imaging results that were available during my care of the patient were reviewed by me and  considered in my medical decision making (see chart for details).  Patient denies any trauma to the left foot, not significant swelling noted.  Pain to foot began [place]  She continued to walk 1-2 miles daily for the next 5 days and the pain has progressively worsened without resolution.  Differential diagnosis include overuse syndrome, extensor tendonitis, and gout arthropathy.   As there is no direct injury and she is a low risk for spontaneous fractures, a fracture would be unlikely.  .  She does have a history of gout but foot is without redness, swelling, or increased heat and does not include a specific joint.  The probability of gout pain is unlikely.  Overuse syndrome diagnosed at this time.  Conservative treatment at this time. We will order a short burst of prednisone to help with the inflammation.  Rest, Ice and NSAIDS.  Topical ointments with massage and stretching would also be beneficial.  She should follow up with PCP or orthopedist if no improvement.     Final Clinical Impressions(s) / UC Diagnoses   Final diagnoses:  Foot pain, left  Overuse syndrome of foot, left, initial encounter     Discharge Instructions      Consider wearing a ankle foot compression sock  For support of arch. Ice 3 times a day for 10 to 15 minutes Topical ointments such as Bengay or Biofreeze. Rest over the next week and avoid walking on hard surfaces for extended period of time until improvement.  Continue range of motion and foot stretches.       ED Prescriptions     Medication Sig Dispense Auth. Provider   acetaminophen (TYLENOL) 500 MG tablet Take 2 tablets (1,000 mg total) by mouth every 6 (six) hours as needed for mild pain (pain score 1-3). 60 tablet Amery Vandenbos, Linde Gillis, NP   predniSONE (DELTASONE) 10 MG tablet Take 1 tablet (10 mg total) by mouth daily with breakfast. 4 tablet Terrica Duecker, Linde Gillis, NP      PDMP not reviewed this encounter.    Nelda Marseille, NP 01/09/23 2123

## 2022-12-21 NOTE — Discharge Instructions (Addendum)
Consider wearing a ankle foot compression sock  For support of arch. Ice 3 times a day for 10 to 15 minutes Topical ointments such as Bengay or Biofreeze. Rest over the next week and avoid walking on hard surfaces for extended period of time until improvement.  Continue range of motion and foot stretches.

## 2022-12-21 NOTE — ED Triage Notes (Signed)
Patient reports that she has done a lot of walking in the past 6 days and egan having pain and slight swelling to the left foot at the arch. Patient has a small bruised area.  Paient states she has been taking Tylenol and the last dose was at 1400 today.

## 2022-12-26 DIAGNOSIS — M7732 Calcaneal spur, left foot: Secondary | ICD-10-CM | POA: Diagnosis not present

## 2022-12-26 DIAGNOSIS — M85872 Other specified disorders of bone density and structure, left ankle and foot: Secondary | ICD-10-CM | POA: Diagnosis not present

## 2022-12-26 DIAGNOSIS — M79672 Pain in left foot: Secondary | ICD-10-CM | POA: Diagnosis not present

## 2022-12-26 DIAGNOSIS — M2012 Hallux valgus (acquired), left foot: Secondary | ICD-10-CM | POA: Diagnosis not present

## 2022-12-27 ENCOUNTER — Other Ambulatory Visit: Payer: Self-pay | Admitting: Sports Medicine

## 2022-12-27 DIAGNOSIS — M79672 Pain in left foot: Secondary | ICD-10-CM

## 2022-12-31 ENCOUNTER — Telehealth: Payer: Self-pay | Admitting: Cardiovascular Disease

## 2022-12-31 NOTE — Telephone Encounter (Signed)
Spoke with patient  and she is aware she can continue to take half tablet of lasix for a total of 10 mg daily

## 2022-12-31 NOTE — Telephone Encounter (Signed)
Patient calling to see why her lasix dose was increased and not discussed with her. She states she has been taking 0.5 tablet of lasix (10 mg) for years. When she went to pick up her refill it was changed to lasix 20 mg daily.   I reviewed notes and it does not have a change to any medications. She would like to know if she is supposed to take 20 mg of lasix or 0.5 tablet (10 mg) of lasix.

## 2022-12-31 NOTE — Telephone Encounter (Signed)
  Pt c/o medication issue:  1. Name of Medication: furosemide (LASIX) 20 MG tablet   2. How are you currently taking this medication (dosage and times per day)?   Take 1 tablet (20 mg total) by mouth daily.    3. Are you having a reaction (difficulty breathing--STAT)? No   4. What is your medication issue? Pt said, she picked up this medication yesterday and she said, she's been taking it half a tablet a day. She wants to confirm the dose and when and why it changed to 1 tablet a day

## 2023-01-06 DIAGNOSIS — Z01818 Encounter for other preprocedural examination: Secondary | ICD-10-CM | POA: Diagnosis not present

## 2023-01-06 DIAGNOSIS — C946 Myelodysplastic disease, not classified: Secondary | ICD-10-CM | POA: Diagnosis not present

## 2023-01-07 ENCOUNTER — Inpatient Hospital Stay: Payer: No Typology Code available for payment source | Attending: Hematology | Admitting: Hematology

## 2023-01-07 VITALS — BP 133/68 | HR 80 | Temp 97.7°F | Resp 16 | Ht 66.0 in | Wt 197.0 lb

## 2023-01-07 DIAGNOSIS — D61818 Other pancytopenia: Secondary | ICD-10-CM | POA: Diagnosis not present

## 2023-01-07 DIAGNOSIS — Z87891 Personal history of nicotine dependence: Secondary | ICD-10-CM | POA: Diagnosis not present

## 2023-01-07 DIAGNOSIS — D469 Myelodysplastic syndrome, unspecified: Secondary | ICD-10-CM | POA: Insufficient documentation

## 2023-01-07 NOTE — Progress Notes (Signed)
HEMATOLOGY/ONCOLOGY CLINIC NOTE  Date of Service: 01/07/23    Patient Care Team: Merri Brunette, MD as PCP - General (Family Medicine) Wendall Stade, MD as PCP - Cardiology (Cardiology)  CHIEF COMPLAINTS/PURPOSE OF CONSULTATION:  Follow-up for continued evaluation and management of MDS with MF   HISTORY OF PRESENTING ILLNESS:  Please see previous note for details on initial presentation  Interval History:   Karen Waller is a 67 y.o. female who is here for continued evaluation and management of her MDS with myelofibrosis and associated cytopenias.   Patient was last seen by me on 11/11/2022 and she complained of mild occasional fatigue, but was doing well overall.   Patient is accompanied by both of her daughters during this visit. Patient notes she has been doing well overall since our last visit. She complains of mild occasional fatigue and occasional gum bleeds.   She denies any new infection issues, fever, chills, night sweats, unexpected weight loss, bone pain, back pain, chest pain, or leg swelling. She complains of occasional back soreness but denies pain.   Patient notes she has been eating well and has been staying well-hydrated.   Patient had a visit with Dr. Dimas Aguas yesterday and her next visit is in February 2025. Patient has not decided if she wants to proceed with bone marrow transplant or Vidaza treatment.    MEDICAL HISTORY:  Past Medical History:  Diagnosis Date   Cardiomyopathy    EF 30-35% diagnosed 02/2008   Central obesity    Gout    left knee   HTN (hypertension)    Pancytopenia (HCC)     SURGICAL HISTORY: Past Surgical History:  Procedure Laterality Date   KNEE SURGERY     TONSILLECTOMY      SOCIAL HISTORY: Social History   Socioeconomic History   Marital status: Married    Spouse name: Not on file   Number of children: 2   Years of education: Not on file   Highest education level: Not on file  Occupational History    Occupation: [employer]  Tobacco Use   Smoking status: Former    Current packs/day: 0.00    Types: Cigarettes    Quit date: 02/19/1984    Years since quitting: 38.9   Smokeless tobacco: Never  Vaping Use   Vaping status: Never Used  Substance and Sexual Activity   Alcohol use: Not Currently   Drug use: Never   Sexual activity: Not on file  Other Topics Concern   Not on file  Social History Narrative   Not on file   Social Determinants of Health   Financial Resource Strain: Not on file  Food Insecurity: Not on file  Transportation Needs: Not on file  Physical Activity: Not on file  Stress: Not on file  Social Connections: Not on file  Intimate Partner Violence: Not on file    FAMILY HISTORY: Family History  Problem Relation Age of Onset   Hypertension Mother    Breast cancer Neg Hx     ALLERGIES:  is allergic to sulfa antibiotics, codeine, and simvastatin.  MEDICATIONS:  Current Outpatient Medications  Medication Sig Dispense Refill   acetaminophen (TYLENOL) 500 MG tablet Take 2 tablets (1,000 mg total) by mouth every 6 (six) hours as needed for mild pain (pain score 1-3). 60 tablet 0   allopurinol (ZYLOPRIM) 100 MG tablet TAKE 1 TABLET(100 MG) BY MOUTH DAILY 90 tablet 1   amLODipine (NORVASC) 10 MG tablet TAKE 1 TABLET(10 MG)  BY MOUTH DAILY 90 tablet 3   B Complex Vitamins (B COMPLEX 100 PO) Take 1 tablet by mouth daily.     candesartan (ATACAND) 16 MG tablet Take 1 tablet (16 mg total) by mouth daily. 90 tablet 3   carvedilol (COREG) 6.25 MG tablet TAKE 1 TABLET BY MOUTH TWICE DAILY WITH A MEAL 180 tablet 3   Cholecalciferol 125 MCG (5000 UT) capsule Take 5,000 Units by mouth daily.     cyclobenzaprine (FLEXERIL) 10 MG tablet Take 1 tablet (10 mg total) by mouth 2 (two) times daily as needed for muscle spasms. 20 tablet 0   furosemide (LASIX) 20 MG tablet Take 1 tablet (20 mg total) by mouth daily. 90 tablet 3   hydrALAZINE (APRESOLINE) 25 MG tablet Take 1 tablet (25 mg  total) by mouth 2 (two) times daily. 180 tablet 3   lidocaine (XYLOCAINE) 2 % solution Use as directed 15 mLs in the mouth or throat every 3 (three) hours as needed for mouth pain (Sore throat). 300 mL 0   methocarbamol (ROBAXIN) 500 MG tablet Take 500 mg by mouth as needed for muscle spasms.     predniSONE (DELTASONE) 10 MG tablet Take 1 tablet (10 mg total) by mouth daily with breakfast. 4 tablet 0   rosuvastatin (CRESTOR) 5 MG tablet Take 1 tablet (5 mg total) by mouth daily. 90 tablet 3   tiZANidine (ZANAFLEX) 4 MG tablet Take 1 tablet (4 mg total) by mouth every 6 (six) hours as needed for muscle spasms. 30 tablet 0   traMADol (ULTRAM) 50 MG tablet Take 50 mg by mouth as needed.     No current facility-administered medications for this visit.    REVIEW OF SYSTEMS:    10 Point review of Systems was done is negative except as noted above.   PHYSICAL EXAMINATION:  Vitals:   01/07/23 1412  BP: 133/68  Pulse: 80  Resp: 16  Temp: 97.7 F (36.5 C)  SpO2: 98%   Filed Weights   01/07/23 1412  Weight: 197 lb (89.4 kg)   .Body mass index is 31.8 kg/m.   GENERAL:alert, in no acute distress and comfortable SKIN: no acute rashes, no significant lesions EYES: conjunctiva are pink and non-injected, sclera anicteric OROPHARYNX: MMM, no exudates, no oropharyngeal erythema or ulceration NECK: supple, no JVD LYMPH:  no palpable lymphadenopathy in the cervical, axillary or inguinal regions LUNGS: clear to auscultation b/l with normal respiratory effort HEART: regular rate & rhythm ABDOMEN:  normoactive bowel sounds , non tender, not distended. Extremity: no pedal edema PSYCH: alert & oriented x 3 with fluent speech NEURO: no focal motor/sensory deficits   LABORATORY DATA:  I have reviewed the data as listed .    Latest Ref Rng & Units 11/11/2022    9:48 AM 08/09/2022   12:39 PM 06/17/2022    9:57 AM  CBC  WBC 4.0 - 10.5 K/uL 2.2  2.0  2.8   Hemoglobin 12.0 - 15.0 g/dL 9.6  9.5   9.8   Hematocrit 36.0 - 46.0 % 29.3  30.2  30.9   Platelets 150 - 400 K/uL 63  61  57    ANC 600     Latest Ref Rng & Units 11/11/2022    9:48 AM 08/09/2022   12:39 PM 06/17/2022    9:57 AM  CMP  Glucose 70 - 99 mg/dL 093  235  573   BUN 8 - 23 mg/dL 16  15  16    Creatinine 0.44 -  1.00 mg/dL 3.08  6.57  8.46   Sodium 135 - 145 mmol/L 140  140  137   Potassium 3.5 - 5.1 mmol/L 4.0  3.8  3.8   Chloride 98 - 111 mmol/L 109  108  105   CO2 22 - 32 mmol/L 24  26  22    Calcium 8.9 - 10.3 mg/dL 9.6  9.3  9.1   Total Protein 6.5 - 8.1 g/dL 7.1  6.4  7.4   Total Bilirubin 0.3 - 1.2 mg/dL 0.6  0.8  0.9   Alkaline Phos 38 - 126 U/L 74  66  82   AST 15 - 41 U/L 19  15  40   ALT 0 - 44 U/L 16  12  45     11/26/2018 BM Flow Pathology Report    11/26/2018 BM Bx Report    Surgical Pathology  CASE: [id number]  PATIENT: Shiloh Suski  Bone Marrow Report    Clinical History: pancytopenia, paraproteinemia, right iliac bone      DIAGNOSIS:   BONE MARROW, ASPIRATE, CLOT, CORE:  -Hypercellular bone marrow for age with trilineage hematopoiesis and  fibrosis.  See comment   PERIPHERAL BLOOD:  -Pancytopenia   COMMENT:   The aspirate, touch imprints, and clot sections are suboptimal which  hinders cytomorphologic evaluation.  The core biopsy is optimal and  shows a hypercellular bone marrow (90%) with a mixture of myeloid cell  lines.  This is associated with reticulin fibrosis.  A significant  CD34-positive blast population is not seen.  The myeloid changes are not  considered specific, and hence, it is unclear whether the findings  represent a primary myeloid neoplasm or represent secondary changes.  Correlation with cytogenetic studies is recommended.  There is no  evidence of lymphoproliferative process or diagnostic plasma cell  neoplasm.    11/26/2018 BM Cytogenetics:    RADIOGRAPHIC STUDIES: I have personally reviewed the radiological images as listed and agreed  with the findings in the report. MM 3D SCREENING MAMMOGRAM BILATERAL BREAST  Result Date: 12/16/2022 CLINICAL DATA:  Screening. EXAM: DIGITAL SCREENING BILATERAL MAMMOGRAM WITH TOMOSYNTHESIS AND CAD TECHNIQUE: Bilateral screening digital craniocaudal and mediolateral oblique mammograms were obtained. Bilateral screening digital breast tomosynthesis was performed. The images were evaluated with computer-aided detection. COMPARISON:  Previous exam(s). ACR Breast Density Category b: There are scattered areas of fibroglandular density. FINDINGS: There are no findings suspicious for malignancy. IMPRESSION: No mammographic evidence of malignancy. A result letter of this screening mammogram will be mailed directly to the patient. RECOMMENDATION: Screening mammogram in one year. (Code:SM-B-01Y) BI-RADS CATEGORY  1: Negative. Electronically Signed   By: Frederico Hamman M.D.   On: 12/16/2022 15:49     ASSESSMENT & PLAN:   67 y.o. female with  1. Minor Spontaneous?/undetected trauma causing bruising No ecchymosis with bruising  Clotting parameters normal from 06/17/17 with normal PT, aPTT and VWD panel and platelet function testing 01/08/18 PT and aPTT, fibrinogen WNL  2. MDS/MPN with fibrosis -- causing Pancytopenia --  -Previous bone marrow biopsy was done in October 2020 showing possible MDS/MPN overlap versus MDS with fibrosis.  No increase in blasts cytogenetics with deletion 20 q. NGS with only CBL mutation.  Oct 2020 BM Bx - hypercellular bone marrow with some reticulin fibrosis. 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 NGS molecular testing showed CBL mutation   PLAN: -Discussed lab results from 01/06/2023 in detail with the patient. CBC showed low WBC at 2.3 K, low hemoglobin  of 9.6 g/dL with hematocrit of 09.8%. -Educated the patient and her daughters about Vidaza treatment.  -Educated the patient about chemotherapy treatment, Vidaza, in detail. Discussed the  length and duration of the treatment.  -Answered all of patient's questions regarding Vidaza treatment, Bone marrow transplant, and other general questions.   -discussed option of Vidaza in lieu of or as a bridge to receiving a bone marrow transplant.  -Patient wants to make a decision on bone marrow transplant or start Vidaza treatment after holidays.   -no significant need for immediate transplant at this time. -Recommend COVID-19 Booster, RSV vaccine, and other age related vaccines.  -Continue to follow-up with Dr. Dimas Aguas.   FOLLOW-UP: RTC with Dr Candise Che with labs in 2 months  The total time spent in the appointment was 30 minutes* .  All of the patient's questions were answered with apparent satisfaction. The patient knows to call the clinic with any problems, questions or concerns.   Wyvonnia Lora MD MS AAHIVMS [facility] Hematology/Oncology Physician [facility]  .*Total Encounter Time as defined by the Centers for Medicare and Medicaid Services includes, in addition to the face-to-face time of a patient visit (documented in the note above) non-face-to-face time: obtaining and reviewing outside history, ordering and reviewing medications, tests or procedures, care coordination (communications with other health care professionals or caregivers) and documentation in the medical record.   I,Param Shah,acting as a Neurosurgeon for Wyvonnia Lora, MD.,have documented all relevant documentation on the behalf of Wyvonnia Lora, MD,as directed by  Wyvonnia Lora, MD while in the presence of Wyvonnia Lora, MD.  .I have reviewed the above documentation for accuracy and completeness, and I agree with the above. Johney Maine MD

## 2023-01-08 DIAGNOSIS — Z6833 Body mass index (BMI) 33.0-33.9, adult: Secondary | ICD-10-CM | POA: Diagnosis not present

## 2023-01-08 DIAGNOSIS — E782 Mixed hyperlipidemia: Secondary | ICD-10-CM | POA: Diagnosis not present

## 2023-01-08 DIAGNOSIS — M109 Gout, unspecified: Secondary | ICD-10-CM | POA: Diagnosis not present

## 2023-01-08 DIAGNOSIS — E669 Obesity, unspecified: Secondary | ICD-10-CM | POA: Diagnosis not present

## 2023-01-08 DIAGNOSIS — I10 Essential (primary) hypertension: Secondary | ICD-10-CM | POA: Diagnosis not present

## 2023-01-09 ENCOUNTER — Encounter (HOSPITAL_COMMUNITY): Payer: Self-pay | Admitting: Emergency Medicine

## 2023-01-13 ENCOUNTER — Encounter: Payer: Self-pay | Admitting: Hematology

## 2023-01-15 ENCOUNTER — Other Ambulatory Visit: Payer: Self-pay | Admitting: Hematology

## 2023-01-19 ENCOUNTER — Ambulatory Visit
Admission: RE | Admit: 2023-01-19 | Discharge: 2023-01-19 | Disposition: A | Discharge status: Home / Self Care | Payer: No Typology Code available for payment source | Source: Ambulatory Visit | Attending: Sports Medicine | Admitting: Sports Medicine

## 2023-01-19 DIAGNOSIS — M79672 Pain in left foot: Secondary | ICD-10-CM | POA: Diagnosis not present

## 2023-02-04 DIAGNOSIS — J3489 Other specified disorders of nose and nasal sinuses: Secondary | ICD-10-CM | POA: Diagnosis not present

## 2023-02-04 DIAGNOSIS — H6123 Impacted cerumen, bilateral: Secondary | ICD-10-CM | POA: Diagnosis not present

## 2023-02-05 DIAGNOSIS — M84375A Stress fracture, left foot, initial encounter for fracture: Secondary | ICD-10-CM | POA: Diagnosis not present

## 2023-02-05 DIAGNOSIS — M79672 Pain in left foot: Secondary | ICD-10-CM | POA: Diagnosis not present

## 2023-02-10 DIAGNOSIS — M79672 Pain in left foot: Secondary | ICD-10-CM | POA: Diagnosis not present

## 2023-03-05 NOTE — Progress Notes (Signed)
HEMATOLOGY/ONCOLOGY CLINIC NOTE  Date of Service: 03/07/23   Patient Care Team: Merri Brunette, MD as PCP - General (Family Medicine) Wendall Stade, MD as PCP - Cardiology (Cardiology)  CHIEF COMPLAINTS/PURPOSE OF CONSULTATION:  Follow-up for continued evaluation and management of MDS with MF   HISTORY OF PRESENTING ILLNESS:  Please see previous note for details on initial presentation  Interval History:   Karen Waller is a 68 y.o. female who is here for continued evaluation and management of her MDS with myelofibrosis and associated cytopenias.   Patient was last seen by me on 01/07/2023 and complained of mild occasional fatigue, occasional gum bleeds, and occasional back soreness.  Today, she reports that she has been doing well overall since her last clinical visit and reports no new concerns over the last few months.  She denies any infection issues, bleeding issues, changes in energy levels, fever, chills, night sweats, abdominal pain, or leg swelling. Patient reports normal energy levels.  Patient was seen by Dr. Dimas Aguas in November and will see them next in March.   She reports that she is UTD with her age-appropriate vaccinations, including RSV, flu, COVID-19, pneumonia, shingles  Patient last received a bone marrow biopsy in July 2024.   She reports that she does take vitamin B complex and vitamin D daily.  MEDICAL HISTORY:  Past Medical History:  Diagnosis Date   Cardiomyopathy    EF 30-35% diagnosed 02/2008   Central obesity    Gout    left knee   HTN (hypertension)    Pancytopenia (HCC)     SURGICAL HISTORY: Past Surgical History:  Procedure Laterality Date   KNEE SURGERY     TONSILLECTOMY      SOCIAL HISTORY: Social History   Socioeconomic History   Marital status: Married    Spouse name: Not on file   Number of children: 2   Years of education: Not on file   Highest education level: Not on file  Occupational History   Occupation:  [employer]  Tobacco Use   Smoking status: Former    Current packs/day: 0.00    Types: Cigarettes    Quit date: 02/19/1984    Years since quitting: 39.0   Smokeless tobacco: Never  Vaping Use   Vaping status: Never Used  Substance and Sexual Activity   Alcohol use: Not Currently   Drug use: Never   Sexual activity: Not on file  Other Topics Concern   Not on file  Social History Narrative   Not on file   Social Drivers of Health   Financial Resource Strain: Not on file  Food Insecurity: Not on file  Transportation Needs: Not on file  Physical Activity: Not on file  Stress: Not on file  Social Connections: Not on file  Intimate Partner Violence: Not on file    FAMILY HISTORY: Family History  Problem Relation Age of Onset   Hypertension Mother    Breast cancer Neg Hx     ALLERGIES:  is allergic to sulfa antibiotics, codeine, and simvastatin.  MEDICATIONS:  Current Outpatient Medications  Medication Sig Dispense Refill   acetaminophen (TYLENOL) 500 MG tablet Take 2 tablets (1,000 mg total) by mouth every 6 (six) hours as needed for mild pain (pain score 1-3). 60 tablet 0   allopurinol (ZYLOPRIM) 100 MG tablet TAKE 1 TABLET(100 MG) BY MOUTH DAILY 90 tablet 1   amLODipine (NORVASC) 10 MG tablet TAKE 1 TABLET(10 MG) BY MOUTH DAILY 90 tablet 3  B Complex Vitamins (B COMPLEX 100 PO) Take 1 tablet by mouth daily.     candesartan (ATACAND) 16 MG tablet Take 1 tablet (16 mg total) by mouth daily. 90 tablet 3   carvedilol (COREG) 6.25 MG tablet TAKE 1 TABLET BY MOUTH TWICE DAILY WITH A MEAL 180 tablet 3   Cholecalciferol 125 MCG (5000 UT) capsule Take 5,000 Units by mouth daily.     cyclobenzaprine (FLEXERIL) 10 MG tablet Take 1 tablet (10 mg total) by mouth 2 (two) times daily as needed for muscle spasms. 20 tablet 0   furosemide (LASIX) 20 MG tablet Take 1 tablet (20 mg total) by mouth daily. 90 tablet 3   hydrALAZINE (APRESOLINE) 25 MG tablet Take 1 tablet (25 mg total) by mouth  2 (two) times daily. 180 tablet 3   lidocaine (XYLOCAINE) 2 % solution Use as directed 15 mLs in the mouth or throat every 3 (three) hours as needed for mouth pain (Sore throat). 300 mL 0   methocarbamol (ROBAXIN) 500 MG tablet Take 500 mg by mouth as needed for muscle spasms.     predniSONE (DELTASONE) 10 MG tablet Take 1 tablet (10 mg total) by mouth daily with breakfast. 4 tablet 0   rosuvastatin (CRESTOR) 5 MG tablet Take 1 tablet (5 mg total) by mouth daily. 90 tablet 3   tiZANidine (ZANAFLEX) 4 MG tablet Take 1 tablet (4 mg total) by mouth every 6 (six) hours as needed for muscle spasms. 30 tablet 0   traMADol (ULTRAM) 50 MG tablet Take 50 mg by mouth as needed.     No current facility-administered medications for this visit.    REVIEW OF SYSTEMS:    10 Point review of Systems was done is negative except as noted above.   PHYSICAL EXAMINATION: .BP (!) 151/52 (BP Location: Left Arm, Patient Position: Sitting)   Pulse 81   Temp 97.8 F (36.6 C) (Temporal)   Resp 18   Ht 5\' 6"  (1.676 m)   Wt 197 lb 6.4 oz (89.5 kg)   SpO2 96%   BMI 31.86 kg/m   GENERAL:alert, in no acute distress and comfortable SKIN: no acute rashes, no significant lesions EYES: conjunctiva are pink and non-injected, sclera anicteric OROPHARYNX: MMM, no exudates, no oropharyngeal erythema or ulceration NECK: supple, no JVD LYMPH:  no palpable lymphadenopathy in the cervical, axillary or inguinal regions LUNGS: clear to auscultation b/l with normal respiratory effort HEART: regular rate & rhythm ABDOMEN:  normoactive bowel sounds , non tender, not distended. Extremity: no pedal edema PSYCH: alert & oriented x 3 with fluent speech NEURO: no focal motor/sensory deficits   LABORATORY DATA:  I have reviewed the data as listed .Marland Kitchen    Latest Ref Rng & Units 03/07/2023    1:26 PM 11/11/2022    9:48 AM 08/09/2022   12:39 PM  CBC  WBC 4.0 - 10.5 K/uL 1.9  2.2  2.0   Hemoglobin 12.0 - 15.0 g/dL 9.6  9.6  9.5    Hematocrit 36.0 - 46.0 % 30.7  29.3  30.2   Platelets 150 - 400 K/uL 58  63  61    .    Latest Ref Rng & Units 03/07/2023    1:26 PM 11/11/2022    9:48 AM 08/09/2022   12:39 PM  CMP  Glucose 70 - 99 mg/dL 094  709  628   BUN 8 - 23 mg/dL 22  16  15    Creatinine 0.44 - 1.00 mg/dL 3.66  1.01  1.07   Sodium 135 - 145 mmol/L 140  140  140   Potassium 3.5 - 5.1 mmol/L 4.3  4.0  3.8   Chloride 98 - 111 mmol/L 110  109  108   CO2 22 - 32 mmol/L 24  24  26    Calcium 8.9 - 10.3 mg/dL 9.5  9.6  9.3   Total Protein 6.5 - 8.1 g/dL 6.9  7.1  6.4   Total Bilirubin 0.0 - 1.2 mg/dL 0.6  0.6  0.8   Alkaline Phos 38 - 126 U/L 70  74  66   AST 15 - 41 U/L 16  19  15    ALT 0 - 44 U/L 10  16  12        11/26/2018 BM Flow Pathology Report    11/26/2018 BM Bx Report    Surgical Pathology  CASE: [id number]  PATIENT: Karen Waller  Bone Marrow Report    Clinical History: pancytopenia, paraproteinemia, right iliac bone      DIAGNOSIS:   BONE MARROW, ASPIRATE, CLOT, CORE:  -Hypercellular bone marrow for age with trilineage hematopoiesis and  fibrosis.  See comment   PERIPHERAL BLOOD:  -Pancytopenia   COMMENT:   The aspirate, touch imprints, and clot sections are suboptimal which  hinders cytomorphologic evaluation.  The core biopsy is optimal and  shows a hypercellular bone marrow (90%) with a mixture of myeloid cell  lines.  This is associated with reticulin fibrosis.  A significant  CD34-positive blast population is not seen.  The myeloid changes are not  considered specific, and hence, it is unclear whether the findings  represent a primary myeloid neoplasm or represent secondary changes.  Correlation with cytogenetic studies is recommended.  There is no  evidence of lymphoproliferative process or diagnostic plasma cell  neoplasm.    11/26/2018 BM Cytogenetics:    RADIOGRAPHIC STUDIES: I have personally reviewed the radiological images as listed and agreed with the  findings in the report. No results found.   ASSESSMENT & PLAN:   68 y.o. female with  1. Minor Spontaneous?/undetected trauma causing bruising No ecchymosis with bruising  Clotting parameters normal from 06/17/17 with normal PT, aPTT and VWD panel and platelet function testing 01/08/18 PT and aPTT, fibrinogen WNL  2. MDS/MPN with fibrosis -- causing Pancytopenia --  -Previous bone marrow biopsy was done in October 2020 showing possible MDS/MPN overlap versus MDS with fibrosis.  No increase in blasts cytogenetics with deletion 20 q. NGS with only CBL mutation.  Oct 2020 BM Bx - hypercellular bone marrow with some reticulin fibrosis. 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 NGS molecular testing showed CBL mutation   PLAN:  -Discussed lab results on 03/07/23 in detail with patient. CBC stable, showed WBC of 1.9K, hemoglobin of 9.6, and platelets of 58K. -neutrophil level is 500 today, and typically fluctuates around 500-600 -discussed that if she decides to proceed with a bone marrow transplant, there would not be any other treatment from our standpoint -if she does not decide to proceed with a bone marrow transplant, there would be a role to consider treatment such as Vidaza to address her blood counts and ensure she does not become transfusion dependent as well as to reduce the risk of neutropenic fever  -also discussed option to continue to monitor -discussed that if there is concerns including WBCs consistently below 500, platelets continuing to drop below 50K, and hgb levels dropping, these factors would be indicators to consider Vidaza  treatment -discussed that Vidaza can drop blood counts temporarily, and there is therefore a role not to wait until she is transfusion dependent to start treatment -discussed that at the 1-year mark, there may be a role for considerations of a repeat bone marrow biopsy, or potentially sooner if there are changes in her blood  counts. This procedure would be done to ensure that her blasts count are not increasing and evaluating whether her mutation type is evolving in any way.  -discussed that if there are findings of increased blasts in the bone marrow, there would be a role to start Vidaza -discussed that the risk of acute leukemia is relatively lower -continue to eat well -continue to take vitamin B complex and vitamin D supplements -answered all of patient's questions in detail -will continue to monitor with labs in 3-4 months -patient has a scheduled appointment with Dr. Dimas Aguas in March 2025  FOLLOW-UP: RTC with Dr Candise Che with labs in 3 months  The total time spent in the appointment was 30 minutes* .  All of the patient's questions were answered with apparent satisfaction. The patient knows to call the clinic with any problems, questions or concerns.   Wyvonnia Lora MD MS AAHIVMS [facility] Hematology/Oncology Physician [facility]  .*Total Encounter Time as defined by the Centers for Medicare and Medicaid Services includes, in addition to the face-to-face time of a patient visit (documented in the note above) non-face-to-face time: obtaining and reviewing outside history, ordering and reviewing medications, tests or procedures, care coordination (communications with other health care professionals or caregivers) and documentation in the medical record.    I,Mitra Faeizi,acting as a Neurosurgeon for Wyvonnia Lora, MD.,have documented all relevant documentation on the behalf of Wyvonnia Lora, MD,as directed by  Wyvonnia Lora, MD while in the presence of Wyvonnia Lora, MD.  .I have reviewed the above documentation for accuracy and completeness, and I agree with the above. Johney Maine MD

## 2023-03-06 ENCOUNTER — Other Ambulatory Visit: Payer: Self-pay

## 2023-03-06 DIAGNOSIS — D469 Myelodysplastic syndrome, unspecified: Secondary | ICD-10-CM

## 2023-03-07 ENCOUNTER — Inpatient Hospital Stay: Payer: No Typology Code available for payment source | Admitting: Hematology

## 2023-03-07 ENCOUNTER — Inpatient Hospital Stay: Payer: No Typology Code available for payment source | Attending: Hematology

## 2023-03-07 VITALS — BP 151/52 | HR 81 | Temp 97.8°F | Resp 18 | Ht 66.0 in | Wt 197.4 lb

## 2023-03-07 DIAGNOSIS — D469 Myelodysplastic syndrome, unspecified: Secondary | ICD-10-CM | POA: Diagnosis not present

## 2023-03-07 DIAGNOSIS — D61818 Other pancytopenia: Secondary | ICD-10-CM | POA: Diagnosis not present

## 2023-03-07 DIAGNOSIS — Z87891 Personal history of nicotine dependence: Secondary | ICD-10-CM | POA: Diagnosis not present

## 2023-03-07 LAB — CBC WITH DIFFERENTIAL (CANCER CENTER ONLY)
Abs Immature Granulocytes: 0 10*3/uL (ref 0.00–0.07)
Basophils Absolute: 0 10*3/uL (ref 0.0–0.1)
Basophils Relative: 0 %
Eosinophils Absolute: 0 10*3/uL (ref 0.0–0.5)
Eosinophils Relative: 2 %
HCT: 30.7 % — ABNORMAL LOW (ref 36.0–46.0)
Hemoglobin: 9.6 g/dL — ABNORMAL LOW (ref 12.0–15.0)
Immature Granulocytes: 0 %
Lymphocytes Relative: 58 %
Lymphs Abs: 1.1 10*3/uL (ref 0.7–4.0)
MCH: 31.1 pg (ref 26.0–34.0)
MCHC: 31.3 g/dL (ref 30.0–36.0)
MCV: 99.4 fL (ref 80.0–100.0)
Monocytes Absolute: 0.2 10*3/uL (ref 0.1–1.0)
Monocytes Relative: 12 %
Neutro Abs: 0.5 10*3/uL — ABNORMAL LOW (ref 1.7–7.7)
Neutrophils Relative %: 28 %
Platelet Count: 58 10*3/uL — ABNORMAL LOW (ref 150–400)
RBC: 3.09 MIL/uL — ABNORMAL LOW (ref 3.87–5.11)
RDW: 18.6 % — ABNORMAL HIGH (ref 11.5–15.5)
Smear Review: DECREASED
WBC Count: 1.9 10*3/uL — ABNORMAL LOW (ref 4.0–10.5)
nRBC: 1.6 % — ABNORMAL HIGH (ref 0.0–0.2)

## 2023-03-07 LAB — CMP (CANCER CENTER ONLY)
ALT: 10 U/L (ref 0–44)
AST: 16 U/L (ref 15–41)
Albumin: 4.2 g/dL (ref 3.5–5.0)
Alkaline Phosphatase: 70 U/L (ref 38–126)
Anion gap: 6 (ref 5–15)
BUN: 22 mg/dL (ref 8–23)
CO2: 24 mmol/L (ref 22–32)
Calcium: 9.5 mg/dL (ref 8.9–10.3)
Chloride: 110 mmol/L (ref 98–111)
Creatinine: 1.05 mg/dL — ABNORMAL HIGH (ref 0.44–1.00)
GFR, Estimated: 59 mL/min — ABNORMAL LOW (ref >60)
Glucose, Bld: 109 mg/dL — ABNORMAL HIGH (ref 70–99)
Potassium: 4.3 mmol/L (ref 3.5–5.1)
Sodium: 140 mmol/L (ref 135–145)
Total Bilirubin: 0.6 mg/dL (ref 0.0–1.2)
Total Protein: 6.9 g/dL (ref 6.5–8.1)

## 2023-03-07 LAB — LACTATE DEHYDROGENASE: LDH: 194 U/L — ABNORMAL HIGH (ref 98–192)

## 2023-03-07 LAB — IRON AND IRON BINDING CAPACITY (CC-WL,HP ONLY)
Iron: 73 ug/dL (ref 28–170)
Saturation Ratios: 22 % (ref 10.4–31.8)
TIBC: 332 ug/dL (ref 250–450)
UIBC: 259 ug/dL (ref 148–442)

## 2023-03-07 LAB — VITAMIN B12: Vitamin B-12: 4036 pg/mL — ABNORMAL HIGH (ref 180–914)

## 2023-03-07 LAB — FERRITIN: Ferritin: 26 ng/mL (ref 11–307)

## 2023-03-13 ENCOUNTER — Encounter: Payer: Self-pay | Admitting: Hematology

## 2023-04-04 DIAGNOSIS — H8110 Benign paroxysmal vertigo, unspecified ear: Secondary | ICD-10-CM | POA: Diagnosis not present

## 2023-04-10 DIAGNOSIS — I2510 Atherosclerotic heart disease of native coronary artery without angina pectoris: Secondary | ICD-10-CM | POA: Diagnosis not present

## 2023-04-10 DIAGNOSIS — E785 Hyperlipidemia, unspecified: Secondary | ICD-10-CM | POA: Diagnosis not present

## 2023-04-10 DIAGNOSIS — Z008 Encounter for other general examination: Secondary | ICD-10-CM | POA: Diagnosis not present

## 2023-04-10 DIAGNOSIS — I130 Hypertensive heart and chronic kidney disease with heart failure and stage 1 through stage 4 chronic kidney disease, or unspecified chronic kidney disease: Secondary | ICD-10-CM | POA: Diagnosis not present

## 2023-04-10 DIAGNOSIS — Z6831 Body mass index (BMI) 31.0-31.9, adult: Secondary | ICD-10-CM | POA: Diagnosis not present

## 2023-04-10 DIAGNOSIS — N1831 Chronic kidney disease, stage 3a: Secondary | ICD-10-CM | POA: Diagnosis not present

## 2023-04-10 DIAGNOSIS — I5032 Chronic diastolic (congestive) heart failure: Secondary | ICD-10-CM | POA: Diagnosis not present

## 2023-04-10 DIAGNOSIS — E669 Obesity, unspecified: Secondary | ICD-10-CM | POA: Diagnosis not present

## 2023-04-11 ENCOUNTER — Telehealth: Payer: Self-pay | Admitting: Cardiovascular Disease

## 2023-04-11 NOTE — Telephone Encounter (Signed)
 Called patient back to let her know Dr. Eden Emms agreed with Roanna Raider, who spoke with her earlier, that she needs to see her PCP.

## 2023-04-11 NOTE — Telephone Encounter (Signed)
 Patient sent a message to patient schedule regarding "Feeling weak after laying on my left side". Please advise.

## 2023-04-11 NOTE — Telephone Encounter (Signed)
 Pt reports "having problems when I lay on my left side", she "feel weak, wakes me up".  States she feels uncomfortable after sleeping on her left side. Denies CP, SOB, wt gain/edema. Informed pt that this does not sound heart related and she should call to see PCP about this first. Aware forwarding to Dr. Fabio Bering nurse to address with him and see if he feels like she needs an appt (she was asking for an appt before being advised to see PCP).  Aware his nurse will follow up and patient agreeable to plan.

## 2023-04-14 ENCOUNTER — Ambulatory Visit: Payer: No Typology Code available for payment source | Attending: Family Medicine | Admitting: Physical Therapy

## 2023-04-14 VITALS — BP 126/71 | HR 65

## 2023-04-14 DIAGNOSIS — R42 Dizziness and giddiness: Secondary | ICD-10-CM | POA: Diagnosis not present

## 2023-04-14 NOTE — Therapy (Signed)
 OUTPATIENT PHYSICAL THERAPY VESTIBULAR EVALUATION     Patient Name: Karen Waller MRN: [id number] DOB:[birth year], 68 y.o., female Today's Date: 04/14/2023  END OF SESSION:  PT End of Session - 04/14/23 1103     Visit Number 1    Number of Visits 5    Date for PT Re-Evaluation 05/14/23    Authorization Type [employer]    PT Start Time 1101    PT Stop Time 1138    PT Time Calculation (min) 37 min    Activity Tolerance Patient tolerated treatment well    Behavior During Therapy WFL for tasks assessed/performed             Past Medical History:  Diagnosis Date   Cardiomyopathy    EF 30-35% diagnosed 02/2008   Central obesity    Gout    left knee   HTN (hypertension)    Pancytopenia (HCC)    Past Surgical History:  Procedure Laterality Date   KNEE SURGERY     TONSILLECTOMY     Patient Active Problem List   Diagnosis Date Noted   Interstitial cystitis 08/17/2022   Iron deficiency anemia 06/22/2019   Pain in left hip 03/24/2018   Bruising, spontaneous 07/06/2017   Left Achilles tendinitis 11/08/2016   Conductive hearing loss, bilateral 08/08/2016   Elevated lipids 12/16/2012   HTN (hypertension) 01/05/2011   CHF (congestive heart failure) (HCC) 10/30/2010   Edema 10/30/2010   Gouty arthropathy 03/17/2008   OVERWEIGHT 03/17/2008   ESSENTIAL HYPERTENSION, BENIGN 03/17/2008   Non-ischemic cardiomyopathy (HCC) 03/17/2008   PALPITATIONS, OCCASIONAL 03/17/2008    PCP: Merri Brunette, MD  REFERRING PROVIDER: Noberto Retort, MD   REFERRING DIAG: H81.10 (ICD-10-CM) - Benign paroxysmal vertigo, unspecified ear   THERAPY DIAG:  Dizziness and giddiness  ONSET DATE: 04/04/2023  Rationale for Evaluation and Treatment: Rehabilitation  SUBJECTIVE:   SUBJECTIVE STATEMENT: Has been having vertigo symptoms, but has not had any in the past 8-10 days. Vertigo started about 3 weeks ago, but it wasn't a consistent thing. Has had it a few times before where she  felt it getting into bed. Was treated before with the Epley maneuver and it has helped. Now it is sparingly like once a week she would get into bed and the room would start spinning. Saw her PCP and got a referral to an ENT and vestibular therapy. If she turns fast, will feel a little woozy, but the room isn't spinning. Normally it was every time on her R side.   Pt accompanied by: self  PERTINENT HISTORY: PMH: HTN, myelodysplastic syndrome   Vertigo, mainly with laying down, during the day feels woozy without full blown vertigo, worse with on her L side  PAIN:  Are you having pain? No  Vitals:   04/14/23 1113  BP: 126/71  Pulse: 65     PRECAUTIONS: None  FALLS: Has patient fallen in last 6 months? No  LIVING ENVIRONMENT: Lives with: lives with their spouse Lives in: House/apartment Stairs: Yes: Internal: 15 steps; on left going up and External: 6 steps; bilateral but cannot reach both Has following equipment at home:  has a single crutch, would only use it for a gout attack   PLOF: Independent  PATIENT GOALS: Wants to have no spinning   OBJECTIVE:  Note: Objective measures were completed at Evaluation unless otherwise noted.  DIAGNOSTIC FINDINGS: CT head 06/17/22: IMPRESSION: No evidence of acute abnormality intracranially or in the cervical spine.  COGNITION: Overall cognitive status: Within  functional limits for tasks assessed    POSTURE:  rounded shoulders  Cervical ROM:   WNL    TRANSFERS: Assistive device utilized: None  Sit to stand: Complete Independence Stand to sit: Complete Independence  GAIT: Gait pattern: WFL Distance walked: Clinic distances  Assistive device utilized: None Level of assistance: Complete Independence Comments: No unsteadiness noted ambulating in and out of clinic    VESTIBULAR ASSESSMENT:  GENERAL OBSERVATION: Ambulates in with no AD    SYMPTOM BEHAVIOR:  Subjective history: See above   Non-Vestibular symptoms:   sometimes would feel a little queasy   Type of dizziness: Spinning/Vertigo  Frequency: Last time happened 8-10 days ago, more infrequent   Duration: Not very long, about a minute   Aggravating factors: Induced by position change: lying supine and rolling to the right  Relieving factors: closing eyes  Progression of symptoms: better  OCULOMOTOR EXAM:  Ocular Alignment: normal  Ocular ROM: No Limitations  Spontaneous Nystagmus: absent  Gaze-Induced Nystagmus: absent  Smooth Pursuits: intact  Saccades: intact and "felt a little woozy with side to side"   VESTIBULAR - OCULAR REFLEX:   Slow VOR: Normal  VOR Cancellation: Normal, felt like she was on a ship, but not too bad   Head-Impulse Test: HIT Right: negative HIT Left: negative Pt reporting no dizziness    POSITIONAL TESTING: Right Dix-Hallpike: no nystagmus Left Dix-Hallpike: no nystagmus Right Roll Test: no nystagmus Left Roll Test: no nystagmus and pt initially reporting some dizziness when going to L side  Right Sidelying: no nystagmus and felt like equilibrium is off when coming upright Left Sidelying: no nystagmus and felt like equilibrium is off when coming upright                                                                                                                             TREATMENT DATE: 04/14/23  Habituation:  Brandt-Daroff: comment: performed 2 reps each side, pt with no dizziness when in side lying position, just when coming upright, pt reporting dizziness got better with incr reps, provided to pt as HEP   Repeated Rolling: comment: performed to R and L, x3 reps, pt only with dizziness initially with rolling to the L with first rep, otherwise no dizziness noted with rolling    PATIENT EDUCATION: Education details: BPPV education (pt negative for BPPV at this time, but it appears that pt has some lingering motion sensitivity), provided pt with Francee Piccolo Daroff exercises to HEP for habituation (pt with less  dizziness with incr reps). Will schedule 1 more follow-up appt as needed  Person educated: Patient Education method: Explanation, Demonstration, Verbal cues, and Handouts Education comprehension: verbalized understanding and returned demonstration  HOME EXERCISE PROGRAM: Access Code: [email or web address] URL: [email or web address] Date: 04/14/2023 Prepared by: Sherlie Ban  Exercises - Brandt-Daroff Vestibular Exercise  - 1-2 x daily - 7 x weekly - 4-5 reps  GOALS: Goals reviewed with patient? Yes  SHORT TERM GOALS: ALL STGS =  LTGS   LONG TERM GOALS: Target date: 05/12/2023  Pt will report no dizziness with bed mobility, esp with supine > sit.  Baseline:  Goal status: INITIAL  2.  Further vestibular testing to be performed as appropriate with LTG written if needed.  Baseline:  Goal status: INITIAL   ASSESSMENT:  CLINICAL IMPRESSION: Patient is a 68 year old female referred to Neuro OPPT for BPPV.   Pt's PMH is significant for: HTN, myelodysplastic syndrome. Pt negative for BPPV with no nystagmus or dizziness noted. Pt did have dizziness with rolling to L and when sitting upright from sidelying position, indicating motion sensitivity. Performed repeated rolling with dizziness subsiding and performed Austin Miles exercises with pt reporting decr symptoms with incr reps. Provided as HEP. Will schedule one more follow-up appt at this time to see how Austin Miles exercises are going and to perform further vestibular assessment as needed. Discussed if pt no longer having dizziness, then can call and cancel appt. And that if BPPV returns in the future,then can get a new referral to return in the future. Pt would benefit from skilled PT to address these impairments and functional limitations to maximize functional mobility independence and decr dizziness.    OBJECTIVE IMPAIRMENTS: decreased activity tolerance and dizziness.   ACTIVITY LIMITATIONS: bed mobility  PARTICIPATION  LIMITATIONS:  N/A  PERSONAL FACTORS: Behavior pattern, Past/current experiences, Time since onset of injury/illness/exacerbation, and 1-2 comorbidities: HTN, myelodysplastic syndrome   are also affecting patient's functional outcome.   REHAB POTENTIAL: Good  CLINICAL DECISION MAKING: Stable/uncomplicated  EVALUATION COMPLEXITY: Low   PLAN:  PT FREQUENCY: 1-2x/week  PT DURATION: 4 weeks  PLANNED INTERVENTIONS: 97164- PT Re-evaluation, 97110-Therapeutic exercises, 97530- Therapeutic activity, [id number]- Neuromuscular re-education, 97535- Self Care, 16109- Manual therapy, [id number]- Canalith repositioning, Balance training, and Vestibular training  PLAN FOR NEXT SESSION: How were brandt daroff exercises? Check canals again as needed or perform more vestibular/balance assessment.    Drake Leach, PT, DPT 04/14/2023, 11:48 AM

## 2023-04-21 ENCOUNTER — Ambulatory Visit: Payer: Self-pay | Admitting: Physical Therapy

## 2023-04-22 DIAGNOSIS — H1131 Conjunctival hemorrhage, right eye: Secondary | ICD-10-CM | POA: Diagnosis not present

## 2023-04-23 ENCOUNTER — Encounter: Payer: Self-pay | Admitting: Hematology

## 2023-04-28 DIAGNOSIS — C946 Myelodysplastic disease, not classified: Secondary | ICD-10-CM | POA: Diagnosis not present

## 2023-06-06 ENCOUNTER — Other Ambulatory Visit: Payer: Self-pay

## 2023-06-06 DIAGNOSIS — D469 Myelodysplastic syndrome, unspecified: Secondary | ICD-10-CM

## 2023-06-09 ENCOUNTER — Inpatient Hospital Stay (HOSPITAL_BASED_OUTPATIENT_CLINIC_OR_DEPARTMENT_OTHER): Payer: No Typology Code available for payment source | Admitting: Hematology

## 2023-06-09 ENCOUNTER — Inpatient Hospital Stay: Payer: No Typology Code available for payment source | Attending: Hematology

## 2023-06-09 VITALS — BP 138/71 | HR 68 | Temp 97.6°F | Resp 18 | Wt 196.4 lb

## 2023-06-09 DIAGNOSIS — D696 Thrombocytopenia, unspecified: Secondary | ICD-10-CM | POA: Diagnosis not present

## 2023-06-09 DIAGNOSIS — D61818 Other pancytopenia: Secondary | ICD-10-CM

## 2023-06-09 DIAGNOSIS — D469 Myelodysplastic syndrome, unspecified: Secondary | ICD-10-CM | POA: Diagnosis not present

## 2023-06-09 DIAGNOSIS — D72819 Decreased white blood cell count, unspecified: Secondary | ICD-10-CM | POA: Insufficient documentation

## 2023-06-09 DIAGNOSIS — Z87891 Personal history of nicotine dependence: Secondary | ICD-10-CM | POA: Diagnosis not present

## 2023-06-09 LAB — CMP (CANCER CENTER ONLY)
ALT: 17 U/L (ref 0–44)
AST: 21 U/L (ref 15–41)
Albumin: 4.4 g/dL (ref 3.5–5.0)
Alkaline Phosphatase: 63 U/L (ref 38–126)
Anion gap: 8 (ref 5–15)
BUN: 19 mg/dL (ref 8–23)
CO2: 23 mmol/L (ref 22–32)
Calcium: 9.4 mg/dL (ref 8.9–10.3)
Chloride: 111 mmol/L (ref 98–111)
Creatinine: 1.04 mg/dL — ABNORMAL HIGH (ref 0.44–1.00)
GFR, Estimated: 59 mL/min — ABNORMAL LOW (ref >60)
Glucose, Bld: 144 mg/dL — ABNORMAL HIGH (ref 70–99)
Potassium: 3.9 mmol/L (ref 3.5–5.1)
Sodium: 142 mmol/L (ref 135–145)
Total Bilirubin: 0.7 mg/dL (ref 0.0–1.2)
Total Protein: 6.8 g/dL (ref 6.5–8.1)

## 2023-06-09 LAB — IRON AND IRON BINDING CAPACITY (CC-WL,HP ONLY)
Iron: 83 ug/dL (ref 28–170)
Saturation Ratios: 25 % (ref 10.4–31.8)
TIBC: 326 ug/dL (ref 250–450)
UIBC: 243 ug/dL (ref 148–442)

## 2023-06-09 LAB — CBC WITH DIFFERENTIAL (CANCER CENTER ONLY)
Abs Immature Granulocytes: 0 10*3/uL (ref 0.00–0.07)
Basophils Absolute: 0 10*3/uL (ref 0.0–0.1)
Basophils Relative: 1 %
Eosinophils Absolute: 0 10*3/uL (ref 0.0–0.5)
Eosinophils Relative: 1 %
HCT: 29.5 % — ABNORMAL LOW (ref 36.0–46.0)
Hemoglobin: 9.6 g/dL — ABNORMAL LOW (ref 12.0–15.0)
Immature Granulocytes: 0 %
Lymphocytes Relative: 51 %
Lymphs Abs: 1 10*3/uL (ref 0.7–4.0)
MCH: 31.4 pg (ref 26.0–34.0)
MCHC: 32.5 g/dL (ref 30.0–36.0)
MCV: 96.4 fL (ref 80.0–100.0)
Monocytes Absolute: 0.2 10*3/uL (ref 0.1–1.0)
Monocytes Relative: 13 %
Neutro Abs: 0.6 10*3/uL — ABNORMAL LOW (ref 1.7–7.7)
Neutrophils Relative %: 34 %
Platelet Count: 49 10*3/uL — ABNORMAL LOW (ref 150–400)
RBC: 3.06 MIL/uL — ABNORMAL LOW (ref 3.87–5.11)
RDW: 18.3 % — ABNORMAL HIGH (ref 11.5–15.5)
WBC Count: 1.9 10*3/uL — ABNORMAL LOW (ref 4.0–10.5)
nRBC: 1.6 % — ABNORMAL HIGH (ref 0.0–0.2)

## 2023-06-09 LAB — VITAMIN B12: Vitamin B-12: 3848 pg/mL — ABNORMAL HIGH (ref 180–914)

## 2023-06-09 LAB — LACTATE DEHYDROGENASE: LDH: 189 U/L (ref 98–192)

## 2023-06-09 LAB — FERRITIN: Ferritin: 27 ng/mL (ref 11–307)

## 2023-06-09 NOTE — Progress Notes (Signed)
 HEMATOLOGY/ONCOLOGY CLINIC NOTE  Date of Service: 06/09/23   Patient Care Team: Faustina Hood, MD as PCP - General (Family Medicine) Loyde Rule, MD as PCP - Cardiology (Cardiology)  CHIEF COMPLAINTS/PURPOSE OF CONSULTATION:  Follow-up for continued evaluation and management of MDS with MF   HISTORY OF PRESENTING ILLNESS:  Please see previous note for details on initial presentation  Interval History:   Karen Waller is a 68 y.o. female who is here for continued evaluation and management of her MDS with myelofibrosis and associated cytopenias.   Patient was last seen by me on 03/07/2023 and she was doing well overall.   Patient notes she has been doing well overall since our last visit. She complains of insomnia-staying asleep and occasional gum bleed (localized area). She denies any new infection issues, fever, chills, night sweats, unexpected weight loss, back pain, chest pain, itchy skin rashes, or leg swelling.   She also reports of one random bruise on her right arm.   She regularly follows-up with Dr. Gwen Lek. Her last visit with Dr. Gwen Lek was on 04/28/2023 who recommended to monitor without treatment.   Patient notes she had a two episodes of subconjunctival hemorrhage in the past two months.   She stays well-hydrated and regularly exercises.   MEDICAL HISTORY:  Past Medical History:  Diagnosis Date   Cardiomyopathy    EF 30-35% diagnosed 02/2008   Central obesity    Gout    left knee   HTN (hypertension)    Pancytopenia (HCC)     SURGICAL HISTORY: Past Surgical History:  Procedure Laterality Date   KNEE SURGERY     TONSILLECTOMY      SOCIAL HISTORY: Social History   Socioeconomic History   Marital status: Married    Spouse name: Not on file   Number of children: 2   Years of education: Not on file   Highest education level: Not on file  Occupational History   Occupation: [employer]  Tobacco Use   Smoking status: Former    Current  packs/day: 0.00    Types: Cigarettes    Quit date: 02/19/1984    Years since quitting: 39.3   Smokeless tobacco: Never  Vaping Use   Vaping status: Never Used  Substance and Sexual Activity   Alcohol use: Not Currently   Drug use: Never   Sexual activity: Not on file  Other Topics Concern   Not on file  Social History Narrative   Not on file   Social Drivers of Health   Financial Resource Strain: Not on file  Food Insecurity: Low Risk  (02/04/2023)   Received from [facility]   Hunger Vital Sign    Worried About Running Out of Food in the Last Year: Never true    Ran Out of Food in the Last Year: Never true  Transportation Needs: No Transportation Needs (02/04/2023)   Received from [facility]    In the past 12 months, has lack of reliable transportation kept you from medical appointments, meetings, work or from getting things needed for daily living? : No  Physical Activity: Not on file  Stress: Not on file  Social Connections: Not on file  Intimate Partner Violence: Not on file    FAMILY HISTORY: Family History  Problem Relation Age of Onset   Hypertension Mother    Breast cancer Neg Hx     ALLERGIES:  is allergic to sulfa antibiotics, codeine, and simvastatin.  MEDICATIONS:  Current  Outpatient Medications  Medication Sig Dispense Refill   acetaminophen  (TYLENOL ) 500 MG tablet Take 2 tablets (1,000 mg total) by mouth every 6 (six) hours as needed for mild pain (pain score 1-3). 60 tablet 0   allopurinol  (ZYLOPRIM ) 100 MG tablet TAKE 1 TABLET(100 MG) BY MOUTH DAILY 90 tablet 1   amLODipine  (NORVASC ) 10 MG tablet TAKE 1 TABLET(10 MG) BY MOUTH DAILY 90 tablet 3   B Complex Vitamins (B COMPLEX 100 PO) Take 1 tablet by mouth daily.     candesartan  (ATACAND ) 16 MG tablet Take 1 tablet (16 mg total) by mouth daily. 90 tablet 3   carvedilol  (COREG ) 6.25 MG tablet TAKE 1 TABLET BY MOUTH TWICE DAILY WITH A MEAL 180 tablet 3   Cholecalciferol 125 MCG  (5000 UT) capsule Take 5,000 Units by mouth daily.     cyclobenzaprine  (FLEXERIL ) 10 MG tablet Take 1 tablet (10 mg total) by mouth 2 (two) times daily as needed for muscle spasms. 20 tablet 0   furosemide  (LASIX ) 20 MG tablet Take 1 tablet (20 mg total) by mouth daily. 90 tablet 3   hydrALAZINE  (APRESOLINE ) 25 MG tablet Take 1 tablet (25 mg total) by mouth 2 (two) times daily. 180 tablet 3   lidocaine  (XYLOCAINE ) 2 % solution Use as directed 15 mLs in the mouth or throat every 3 (three) hours as needed for mouth pain (Sore throat). 300 mL 0   methocarbamol  (ROBAXIN ) 500 MG tablet Take 500 mg by mouth as needed for muscle spasms.     predniSONE  (DELTASONE ) 10 MG tablet Take 1 tablet (10 mg total) by mouth daily with breakfast. 4 tablet 0   rosuvastatin  (CRESTOR ) 5 MG tablet Take 1 tablet (5 mg total) by mouth daily. 90 tablet 3   tiZANidine  (ZANAFLEX ) 4 MG tablet Take 1 tablet (4 mg total) by mouth every 6 (six) hours as needed for muscle spasms. 30 tablet 0   traMADol  (ULTRAM ) 50 MG tablet Take 50 mg by mouth as needed.     No current facility-administered medications for this visit.    REVIEW OF SYSTEMS:    10 Point review of Systems was done is negative except as noted above.   PHYSICAL EXAMINATION: .There were no vitals taken for this visit.  GENERAL:alert, in no acute distress and comfortable SKIN: no acute rashes, no significant lesions EYES: conjunctiva are pink and non-injected, sclera anicteric OROPHARYNX: MMM, no exudates, no oropharyngeal erythema or ulceration NECK: supple, no JVD LYMPH:  no palpable lymphadenopathy in the cervical, axillary or inguinal regions LUNGS: clear to auscultation b/l with normal respiratory effort HEART: regular rate & rhythm ABDOMEN:  normoactive bowel sounds , non tender, not distended. Extremity: no pedal edema PSYCH: alert & oriented x 3 with fluent speech NEURO: no focal motor/sensory deficits   LABORATORY DATA:  I have reviewed the data  as listed .Aaron Aas    Latest Ref Rng & Units 03/07/2023    1:26 PM 11/11/2022    9:48 AM 08/09/2022   12:39 PM  CBC  WBC 4.0 - 10.5 K/uL 1.9  2.2  2.0   Hemoglobin 12.0 - 15.0 g/dL 9.6  9.6  9.5   Hematocrit 36.0 - 46.0 % 30.7  29.3  30.2   Platelets 150 - 400 K/uL 58  63  61    .    Latest Ref Rng & Units 03/07/2023    1:26 PM 11/11/2022    9:48 AM 08/09/2022   12:39 PM  CMP  Glucose 70 -  99 mg/dL 161  096  045   BUN 8 - 23 mg/dL 22  16  15    Creatinine 0.44 - 1.00 mg/dL 4.09  8.11  9.14   Sodium 135 - 145 mmol/L 140  140  140   Potassium 3.5 - 5.1 mmol/L 4.3  4.0  3.8   Chloride 98 - 111 mmol/L 110  109  108   CO2 22 - 32 mmol/L 24  24  26    Calcium  8.9 - 10.3 mg/dL 9.5  9.6  9.3   Total Protein 6.5 - 8.1 g/dL 6.9  7.1  6.4   Total Bilirubin 0.0 - 1.2 mg/dL 0.6  0.6  0.8   Alkaline Phos 38 - 126 U/L 70  74  66   AST 15 - 41 U/L 16  19  15    ALT 0 - 44 U/L 10  16  12        11/26/2018 BM Flow Pathology Report    11/26/2018 BM Bx Report    Surgical Pathology  CASE: [id number]  PATIENT: Merridy Griffee  Bone Marrow Report    Clinical History: pancytopenia, paraproteinemia, right iliac bone      DIAGNOSIS:   BONE MARROW, ASPIRATE, CLOT, CORE:  -Hypercellular bone marrow for age with trilineage hematopoiesis and  fibrosis.  See comment   PERIPHERAL BLOOD:  -Pancytopenia   COMMENT:   The aspirate, touch imprints, and clot sections are suboptimal which  hinders cytomorphologic evaluation.  The core biopsy is optimal and  shows a hypercellular bone marrow (90%) with a mixture of myeloid cell  lines.  This is associated with reticulin fibrosis.  A significant  CD34-positive blast population is not seen.  The myeloid changes are not  considered specific, and hence, it is unclear whether the findings  represent a primary myeloid neoplasm or represent secondary changes.  Correlation with cytogenetic studies is recommended.  There is no  evidence of  lymphoproliferative process or diagnostic plasma cell  neoplasm.    11/26/2018 BM Cytogenetics:    RADIOGRAPHIC STUDIES: I have personally reviewed the radiological images as listed and agreed with the findings in the report. No results found.   ASSESSMENT & PLAN:   68 y.o. female with  1. Minor Spontaneous?/undetected trauma causing bruising No ecchymosis with bruising  Clotting parameters normal from 06/17/17 with normal PT, aPTT and VWD panel and platelet function testing 01/08/18 PT and aPTT, fibrinogen  WNL  2. MDS/MPN with fibrosis -- causing Pancytopenia --  -Previous bone marrow biopsy was done in October 2020 showing possible MDS/MPN overlap versus MDS with fibrosis.  No increase in blasts cytogenetics with deletion 20 q. NGS with only CBL mutation.  Oct 2020 BM Bx - hypercellular bone marrow with some reticulin fibrosis. 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 CBC shows low WBC of 1.9 K, low Hgb of 9.6 g/dL with Hct of 78.2%, and low platelets of 49 K. CMP shows slightly elevated creatinine of 1.04.  -Educated the patient about different approaches to help her sleep better at night. Could take small dose of melatonin.    -answered all of patient's questions in detail -will continue to monitor with labs in 3-4 months   FOLLOW-UP: RTC with Dr Salomon Cree with labs in 3 months  The total time spent in the appointment was *** minutes* .  All of the patient's questions were  answered with apparent satisfaction. The patient knows to call the clinic with any problems, questions or concerns.   Jacquelyn Matt MD MS AAHIVMS [facility] Hematology/Oncology Physician [facility]  .*Total Encounter Time as defined by the Centers for Medicare and Medicaid Services includes, in addition to the face-to-face time of a patient  visit (documented in the note above) non-face-to-face time: obtaining and reviewing outside history, ordering and reviewing medications, tests or procedures, care coordination (communications with other health care professionals or caregivers) and documentation in the medical record.   I,Param Shah,acting as a Neurosurgeon for Jacquelyn Matt, MD.,have documented all relevant documentation on the behalf of Jacquelyn Matt, MD,as directed by  Jacquelyn Matt, MD while in the presence of Jacquelyn Matt, MD.

## 2023-06-11 ENCOUNTER — Encounter: Payer: Self-pay | Admitting: Hematology

## 2023-06-27 ENCOUNTER — Other Ambulatory Visit: Payer: Self-pay | Admitting: Hematology

## 2023-07-02 ENCOUNTER — Other Ambulatory Visit: Payer: Self-pay | Admitting: Hematology

## 2023-07-16 DIAGNOSIS — K219 Gastro-esophageal reflux disease without esophagitis: Secondary | ICD-10-CM | POA: Diagnosis not present

## 2023-07-16 DIAGNOSIS — E669 Obesity, unspecified: Secondary | ICD-10-CM | POA: Diagnosis not present

## 2023-07-16 DIAGNOSIS — D469 Myelodysplastic syndrome, unspecified: Secondary | ICD-10-CM | POA: Diagnosis not present

## 2023-07-23 DIAGNOSIS — H6123 Impacted cerumen, bilateral: Secondary | ICD-10-CM | POA: Diagnosis not present

## 2023-07-23 DIAGNOSIS — H60332 Swimmer's ear, left ear: Secondary | ICD-10-CM | POA: Diagnosis not present

## 2023-08-18 DIAGNOSIS — Z01818 Encounter for other preprocedural examination: Secondary | ICD-10-CM | POA: Diagnosis not present

## 2023-08-18 DIAGNOSIS — D7581 Myelofibrosis: Secondary | ICD-10-CM | POA: Diagnosis not present

## 2023-08-18 DIAGNOSIS — C946 Myelodysplastic disease, not classified: Secondary | ICD-10-CM | POA: Diagnosis not present

## 2023-08-18 DIAGNOSIS — D61818 Other pancytopenia: Secondary | ICD-10-CM | POA: Diagnosis not present

## 2023-08-18 DIAGNOSIS — M19019 Primary osteoarthritis, unspecified shoulder: Secondary | ICD-10-CM | POA: Diagnosis not present

## 2023-08-18 DIAGNOSIS — I509 Heart failure, unspecified: Secondary | ICD-10-CM | POA: Diagnosis not present

## 2023-08-18 DIAGNOSIS — M170 Bilateral primary osteoarthritis of knee: Secondary | ICD-10-CM | POA: Diagnosis not present

## 2023-08-20 ENCOUNTER — Ambulatory Visit: Attending: Cardiology | Admitting: Nurse Practitioner

## 2023-08-20 ENCOUNTER — Encounter: Payer: Self-pay | Admitting: Nurse Practitioner

## 2023-08-20 VITALS — BP 122/76 | HR 63 | Ht 66.0 in | Wt 198.6 lb

## 2023-08-20 DIAGNOSIS — I420 Dilated cardiomyopathy: Secondary | ICD-10-CM

## 2023-08-20 DIAGNOSIS — R002 Palpitations: Secondary | ICD-10-CM

## 2023-08-20 DIAGNOSIS — I2510 Atherosclerotic heart disease of native coronary artery without angina pectoris: Secondary | ICD-10-CM

## 2023-08-20 DIAGNOSIS — E785 Hyperlipidemia, unspecified: Secondary | ICD-10-CM | POA: Diagnosis not present

## 2023-08-20 DIAGNOSIS — D61818 Other pancytopenia: Secondary | ICD-10-CM

## 2023-08-20 DIAGNOSIS — I10 Essential (primary) hypertension: Secondary | ICD-10-CM

## 2023-08-20 DIAGNOSIS — R011 Cardiac murmur, unspecified: Secondary | ICD-10-CM | POA: Diagnosis not present

## 2023-08-20 NOTE — Patient Instructions (Signed)
 Medication Instructions:  Your physician recommends that you continue on your current medications as directed. Please refer to the Current Medication list given to you today.  *If you need a refill on your cardiac medications before your next appointment, please call your pharmacy*  Lab Work: NONE If you have labs (blood work) drawn today and your tests are completely normal, you will receive your results only by: MyChart Message (if you have MyChart) OR A paper copy in the mail If you have any lab test that is abnormal or we need to change your treatment, we will call you to review the results.  Testing/Procedures: Your physician has requested that you have an echocardiogram. Echocardiography is a painless test that uses sound waves to create images of your heart. It provides your doctor with information about the size and shape of your heart and how well your heart's chambers and valves are working. This procedure takes approximately one hour. There are no restrictions for this procedure. Please do NOT wear cologne, perfume, aftershave, or lotions (deodorant is allowed). Please arrive 15 minutes prior to your appointment time.  Please note: We ask at that you not bring children with you during ultrasound (echo/ vascular) testing. Due to room size and safety concerns, children are not allowed in the ultrasound rooms during exams. Our front office staff cannot provide observation of children in our lobby area while testing is being conducted. An adult accompanying a patient to their appointment will only be allowed in the ultrasound room at the discretion of the ultrasound technician under special circumstances. We apologize for any inconvenience.   Follow-Up: At [facility], you and your health needs are our priority.  As part of our continuing mission to provide you with exceptional heart care, our providers are all part of one team.  This team includes your primary Cardiologist  (physician) and Advanced Practice Providers or APPs (Physician Assistants and Nurse Practitioners) who all work together to provide you with the care you need, when you need it.  Your next appointment:   6 month(s)  Provider:   Maude Emmer, MD

## 2023-08-20 NOTE — Progress Notes (Signed)
 Office Visit    Patient Name: Karen Waller Date of Encounter: 08/20/2023  Primary Care Provider:  Claudene Pellet, MD Primary Cardiologist:  Maude Emmer, MD  Chief Complaint    68 year old female with a history of coronary artery calcification noted on prior CT, dilated cardiomyopathy, palpitations, hypertension, hyperlipidemia pancytopenia, and myelodysplastic/myeloproliferative neoplasms who presents for follow-up related to CAD and cardiomyopathy.  Past Medical History    Past Medical History:  Diagnosis Date   Cardiomyopathy    EF 30-35% diagnosed 02/2008   Central obesity    Gout    left knee   HTN (hypertension)    Pancytopenia (HCC)    Past Surgical History:  Procedure Laterality Date   KNEE SURGERY     TONSILLECTOMY      Allergies  Allergies  Allergen Reactions   Sulfa Antibiotics Shortness Of Breath   Codeine Other (See Comments)    The patient stats she has oliguria with it    Simvastatin     Leg heaviness     Labs/Other Studies Reviewed    The following studies were reviewed today:  Cardiac Studies & Procedures   ______________________________________________________________________________________________   STRESS TESTS  NM PET CT CARDIAC PERFUSION MULTI W/ABSOLUTE BLOODFLOW 12/04/2021  Narrative   The study is grossly normal. The study is low risk.   LV perfusion is grossly normal. There is 1 segment of mild reversal perfusion defect at the inferior base with hypokinesis of the segment. This may represent a small segment of ischemia in a branch vessel, however given overall preserved global MBFR, and atypical location for ischemia, this finding is likely low risk.   Rest left ventricular function is normal. Rest EF: 49 %. Stress left ventricular function is normal. Stress EF: 54 %. End diastolic cavity size is normal. End systolic cavity size is normal. No evidence of transient ischemic dilation (TID) noted.   Myocardial blood flow was  computed to be 0.64ml/g/min at rest and 1.97ml/g/min at stress. Global myocardial blood flow reserve was 2.21 and was normal.   Coronary calcium  was present on the attenuation correction CT images. Severe coronary calcifications were present. Coronary calcifications were present in the left anterior descending artery, left circumflex artery and right coronary artery distribution(s).   Electronically signed by: Soyla DELENA Merck, MD  EXAM: OVER-READ INTERPRETATION  CT CHEST  The following report is a limited chest CT over-read performed by radiologist Dr. Elsie Ko [facility] on 12/04/2021. This over-read does not include interpretation of cardiac or coronary anatomy or pathology nor does it include evaluation of the PET data. The cardiac PET-CT interpretation by the cardiologist is attached.  COMPARISON:  Chest radiographs 09/01/2021  FINDINGS: Mediastinum/Nodes: No enlarged lymph nodes within the visualized mediastinum.Small hiatal hernia.  Lungs/Pleura: No pleural effusion or pneumothorax. Mild dependent atelectasis at both lung bases. The visualized lungs are otherwise clear.  Upper abdomen: No significant findings in the visualized upper abdomen.  Musculoskeletal/Chest wall: No chest wall mass or suspicious osseous findings within the visualized chest.  IMPRESSION: No significant extracardiac findings within the visualized chest.   Electronically Signed By: Elsie Perone M.D. On: 12/04/2021 15:30   ECHOCARDIOGRAM  ECHOCARDIOGRAM COMPLETE 06/06/2021  Narrative ECHOCARDIOGRAM REPORT    Patient Name:   Karen Waller Date of Exam: 06/06/2021 Medical Rec #:  [id number]        Height:       66.0 in Accession #:    [id number]       Weight:  197.4 lb Date of Birth:  [birth year]        BSA:          1.989 m Patient Age:    65 years         BP:           168/82 mmHg Patient Gender: F                HR:           62 bpm. Exam Location:  [address]  Procedure: 2D Echo, Cardiac Doppler and Color Doppler  Indications:    R55 Syncope  History:        Patient has prior history of Echocardiogram examinations, most recent 02/13/2021. Cardiomyopathy; Signs/Symptoms:Syncope, Murmur and Palpitations.  Sonographer:    Elsie Bohr RDCS Referring Phys: [id number] TESSA N CONTE  IMPRESSIONS   1. Left ventricular ejection fraction, by estimation, is 50 to 55%. The left ventricle has low normal function. The left ventricle has no regional wall motion abnormalities. There is mild left ventricular hypertrophy. Left ventricular diastolic parameters are consistent with Grade I diastolic dysfunction (impaired relaxation). 2. Right ventricular systolic function is normal. The right ventricular size is normal. There is normal pulmonary artery systolic pressure. The estimated right ventricular systolic pressure is 19.2 mmHg. 3. The mitral valve is abnormal. Trivial mitral valve regurgitation. 4. The aortic valve is tricuspid. Aortic valve regurgitation is not visualized. 5. The inferior vena cava is normal in size with greater than 50% respiratory variability, suggesting right atrial pressure of 3 mmHg.  Comparison(s): No significant change from prior study. 02/13/2021: LVEF 50-55%.  FINDINGS Left Ventricle: Left ventricular ejection fraction, by estimation, is 50 to 55%. The left ventricle has low normal function. The left ventricle has no regional wall motion abnormalities. The left ventricular internal cavity size was normal in size. There is mild left ventricular hypertrophy. Left ventricular diastolic parameters are consistent with Grade I diastolic dysfunction (impaired relaxation). Indeterminate filling pressures.  Right Ventricle: The right ventricular size is normal. No increase in right ventricular wall thickness. Right ventricular systolic function is normal. There is normal pulmonary artery systolic pressure. The tricuspid regurgitant  velocity is 2.01 m/s, and with an assumed right atrial pressure of 3 mmHg, the estimated right ventricular systolic pressure is 19.2 mmHg.  Left Atrium: Left atrial size was normal in size.  Right Atrium: Right atrial size was normal in size.  Pericardium: There is no evidence of pericardial effusion.  Mitral Valve: The mitral valve is abnormal. There is mild thickening of the anterior and posterior mitral valve leaflet(s). Trivial mitral valve regurgitation.  Tricuspid Valve: The tricuspid valve is grossly normal. Tricuspid valve regurgitation is trivial.  Aortic Valve: The aortic valve is tricuspid. Aortic valve regurgitation is not visualized.  Pulmonic Valve: The pulmonic valve was grossly normal. Pulmonic valve regurgitation is mild.  Aorta: The aortic root and ascending aorta are structurally normal, with no evidence of dilitation.  Venous: The inferior vena cava is normal in size with greater than 50% respiratory variability, suggesting right atrial pressure of 3 mmHg.  IAS/Shunts: No atrial level shunt detected by color flow Doppler.   LEFT VENTRICLE PLAX 2D LVIDd:         5.10 cm   Diastology LVIDs:         3.80 cm   LV e' medial:    8.38 cm/s LV PW:         1.30 cm   LV E/e' medial:  10.4 LV IVS:        1.00 cm   LV e' lateral:   8.70 cm/s LVOT diam:     2.00 cm   LV E/e' lateral: 10.0 LV SV:         54 LV SV Index:   27 LVOT Area:     3.14 cm   RIGHT VENTRICLE             IVC RV S prime:     17.50 cm/s  IVC diam: 1.60 cm TAPSE (M-mode): 1.9 cm RVSP:           19.2 mmHg  LEFT ATRIUM             Index        RIGHT ATRIUM           Index LA diam:        4.00 cm 2.01 cm/m   RA Pressure: 3.00 mmHg LA Vol (A2C):   62.7 ml 31.53 ml/m  RA Area:     10.20 cm LA Vol (A4C):   55.0 ml 27.66 ml/m  RA Volume:   20.30 ml  10.21 ml/m LA Biplane Vol: 58.5 ml 29.42 ml/m AORTIC VALVE LVOT Vmax:   80.60 cm/s LVOT Vmean:  54.200 cm/s LVOT VTI:    0.172 m  AORTA Ao  Root diam: 3.40 cm Ao Asc diam:  3.40 cm  MITRAL VALVE                TRICUSPID VALVE MV Area (PHT): 3.28 cm     TR Peak grad:   16.2 mmHg MV Decel Time: 231 msec     TR Vmax:        201.00 cm/s MV E velocity: 87.00 cm/s   Estimated RAP:  3.00 mmHg MV A velocity: 110.00 cm/s  RVSP:           19.2 mmHg MV E/A ratio:  0.79 SHUNTS Systemic VTI:  0.17 m Systemic Diam: 2.00 cm  Vinie Maxcy MD Electronically signed by Vinie Maxcy MD Signature Date/Time: 06/06/2021/2:12:42 PM    Final    MONITORS  LONG TERM MONITOR (3-14 DAYS) 05/21/2021  Narrative Patch Wear Time:  13 days and 12 hours (2023-03-10T21:25:52-0500 to 2023-03-24T10:34:27-0400)  Patient had a min HR of 49 bpm, max HR of 139 bpm, and avg HR of 73 bpm. Predominant underlying rhythm was Sinus Rhythm. Isolated SVEs were rare (<1.0%), SVE Couplets were rare (<1.0%), and SVE Triplets were rare (<1.0%). Isolated VEs were rare (<1.0%, 6440), VE Couplets were rare (<1.0%, 31), and VE Triplets were rare (<1.0%, 1). Ventricular Bigeminy and Trigeminy were present.  Maude Emmer MD [facility]     CARDIAC MRI  MR CARDIAC MORPHOLOGY W WO CONTRAST 01/13/2013  Narrative CLINICAL DATA:  Cardiomyopathy  EXAM: CARDIAC MRI  TECHNIQUE: The patient was scanned on a 1.5 Tesla GE magnet. A dedicated cardiac coil was used. Functional imaging was done using Fiesta sequences. 2,3, and 4 chamber views were done to assess for RWMA's. Modified Simpson's rule using a short axis stack was used to calculate an ejection fraction on a dedicated work [employer]. The patient received 30 cc of Multihance . After 10 minutes inversion recovery sequences were used to assess for infiltration and scar tissue.  CONTRAST:  30 cc Multihance   FINDINGS: There was mild LAE. There was mild LVE. There was evidence for ventricular noncompaction. There was diffuse hypokinesis. The quantitative EF was 47% (EDV 151, ESV 80 SV 70)  There were  no discrete RWMA;s The AV, TV and MV were structurally normal. There was no ASD/VSD. There was no effusion. The RA and RV were normal in size The ascending aortic root was normal. Delayed enhancement images showed no scar or infarct.  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  3) Mild LAE  4) Normal RA and RV  Maude Emmer   Electronically Signed By: Maude Emmer M.D. On: 01/13/2013 17:12   ______________________________________________________________________________________________     Recent Labs: 06/09/2023: ALT 17; BUN 19; Creatinine 1.04; Hemoglobin 9.6; Platelet Count 49; Potassium 3.9; Sodium 142  Recent Lipid Panel    Component Value Date/Time   CHOL 108 03/11/2022 0855   TRIG 87 03/11/2022 0855   HDL 42 03/11/2022 0855   CHOLHDL 2.6 03/11/2022 0855   LDLCALC 49 03/11/2022 0855    History of Present Illness    68 year old female with the above past medical history including coronary artery calcification noted on prior CT, dilated cardiomyopathy, palpitations, hypertension, hyperlipidemia pancytopenia, and myelodysplastic/ myeloproliferative neoplasms.  She has a history of dilated cardiomyopathy with prior EF 35 to 40% in 2016, coronary artery calcification noted on prior CT.  Most recent echocardiogram in 05/2021 showed EF improved to 50 to 55%, low normal LV function, no RWMA, mild LVH, G1 DD, normal RV systolic function, no significant valvular abnormalities.  Cardiac monitor in 05/2021 in the setting of palpitations revealed predominantly sinus rhythm, rare PACs and PVCs.  Stress test in 11/2021 the setting of substernal chest pain was low risk, no evidence of ischemia or infarction, EF 49%. She was last seen in the office on 12/19/2022 and was stable from a cardiac standpoint.   She presents today for follow-up.  Since her last visit she has been stable from a cardiac standpoint.   She notes intermittent palpitations, these are more noticeable when she lays down to sleep at night, particularly on her left side, denies any associated symptoms.  She saw her PCP who mentioned that she heard a heart murmur.  She exercises regularly and is tolerating this well.  She denies any chest pain, dizziness, presyncope, syncope, dyspnea, edema, PND, orthopnea, weight gain.  Overall, she reports feeling well.    Home Medications    Current Outpatient Medications  Medication Sig Dispense Refill   acetaminophen  (TYLENOL ) 500 MG tablet Take 2 tablets (1,000 mg total) by mouth every 6 (six) hours as needed for mild pain (pain score 1-3). 60 tablet 0   allopurinol  (ZYLOPRIM ) 100 MG tablet TAKE 1 TABLET(100 MG) BY MOUTH DAILY 90 tablet 1   amLODipine  (NORVASC ) 10 MG tablet TAKE 1 TABLET(10 MG) BY MOUTH DAILY 90 tablet 3   B Complex Vitamins (B COMPLEX 100 PO) Take 1 tablet by mouth daily.     candesartan  (ATACAND ) 16 MG tablet Take 1 tablet (16 mg total) by mouth daily. 90 tablet 3   carvedilol  (COREG ) 6.25 MG tablet TAKE 1 TABLET BY MOUTH TWICE DAILY WITH A MEAL 180 tablet 3   Cholecalciferol 125 MCG (5000 UT) capsule Take 5,000 Units by mouth daily.     cyclobenzaprine  (FLEXERIL ) 10 MG tablet Take 1 tablet (10 mg total) by mouth 2 (two) times daily as needed for muscle spasms. 20 tablet 0   furosemide  (LASIX ) 20 MG tablet Take 1 tablet (20 mg total) by mouth daily. (Patient taking differently: Take 10 mg by mouth daily.) 90 tablet 3   hydrALAZINE  (APRESOLINE )  25 MG tablet Take 1 tablet (25 mg total) by mouth 2 (two) times daily. 180 tablet 3   methocarbamol  (ROBAXIN ) 500 MG tablet Take 500 mg by mouth as needed for muscle spasms.     Multiple Vitamins-Minerals (MULTIVITAMIN WITH MINERALS) tablet Take 1 tablet by mouth daily.     rosuvastatin  (CRESTOR ) 5 MG tablet Take 1 tablet (5 mg total) by mouth daily. 90 tablet 3   tiZANidine  (ZANAFLEX ) 4 MG tablet Take 1 tablet (4 mg total) by mouth every  6 (six) hours as needed for muscle spasms. 30 tablet 0   traMADol  (ULTRAM ) 50 MG tablet Take 50 mg by mouth as needed.     lidocaine  (XYLOCAINE ) 2 % solution Use as directed 15 mLs in the mouth or throat every 3 (three) hours as needed for mouth pain (Sore throat). (Patient not taking: Reported on 08/20/2023) 300 mL 0   predniSONE  (DELTASONE ) 10 MG tablet Take 1 tablet (10 mg total) by mouth daily with breakfast. (Patient not taking: Reported on 08/20/2023) 4 tablet 0   No current facility-administered medications for this visit.     Review of Systems    She denies chest pain, palpitations, dyspnea, pnd, orthopnea, n, v, dizziness, syncope, edema, weight gain, or early satiety. All other systems reviewed and are otherwise negative except as noted above.   Physical Exam    VS:  BP 122/76 (BP Location: Left Arm, Patient Position: Sitting, Cuff Size: Normal)   Pulse 63   Ht 5' 6 (1.676 m)   Wt 198 lb 9.6 oz (90.1 kg)   SpO2 97%   BMI 32.05 kg/m  GEN: Well nourished, well developed, in no acute distress. HEENT: normal. Neck: Supple, no JVD, carotid bruits, or masses. Cardiac: RRR, no murmurs, rubs, or gallops. No clubbing, cyanosis, edema.  Radials/DP/PT 2+ and equal bilaterally.  Respiratory:  Respirations regular and unlabored, clear to auscultation bilaterally. GI: Soft, nontender, nondistended, BS + x 4. MS: no deformity or atrophy. Skin: warm and dry, no rash. Neuro:  Strength and sensation are intact. Psych: Normal affect.  Accessory Clinical Findings    ECG personally reviewed by me today - EKG Interpretation Date/Time:  Wednesday August 20 2023 13:52:51 EDT Ventricular Rate:  63 PR Interval:  156 QRS Duration:  90 QT Interval:  418 QTC Calculation: 427 R Axis:   7  Text Interpretation: Normal sinus rhythm Normal ECG When compared with ECG of 17-Jun-2022 09:48, PREVIOUS ECG IS PRESENT Confirmed by Daneen Perkins ([id number]) on 08/20/2023 2:07:35 PM  - no acute changes.   Lab  Results  Component Value Date   WBC 1.9 (L) 06/09/2023   HGB 9.6 (L) 06/09/2023   HCT 29.5 (L) 06/09/2023   MCV 96.4 06/09/2023   PLT 49 (L) 06/09/2023   Lab Results  Component Value Date   CREATININE 1.04 (H) 06/09/2023   BUN 19 06/09/2023   NA 142 06/09/2023   K 3.9 06/09/2023   CL 111 06/09/2023   CO2 23 06/09/2023   Lab Results  Component Value Date   ALT 17 06/09/2023   AST 21 06/09/2023   ALKPHOS 63 06/09/2023   BILITOT 0.7 06/09/2023   Lab Results  Component Value Date   CHOL 108 03/11/2022   HDL 42 03/11/2022   LDLCALC 49 03/11/2022   TRIG 87 03/11/2022   CHOLHDL 2.6 03/11/2022    No results found for: HGBA1C  Assessment & Plan   1. Coronary artery calcification: Noted on prior CT. Stress test in  11/2021 the setting of substernal chest pain was low risk, no evidence of ischemia or infarction, EF 49%.  Stable with no anginal symptoms.  No indication for ischemic evaluation.  Continue Crestor .  2. Dilated cardiomyopathy with improved EF: Prior EF 35 to 40% in 2016, coronary artery calcification noted on prior CT.  Most recent echocardiogram in 05/2021 showed EF improved to 50 to 55%, low normal LV function, no RWMA, mild LVH, G1 DD, normal RV systolic function, no significant valvular abnormalities.  Euvolemic and well compensated on exam.  Will repeat echocardiogram in the setting of murmur (though not appreciated on exam today). Continue carvedilol , candesartan , hydralazine , Lasix .   3. Palpitations: Cardiac monitor in 05/2021 in the setting of palpitations revealed predominantly sinus rhythm, rare PACs and PVCs.  He denies any recent palpitations.  Continue carvedilol .  4. Hypertension: BP well controlled. Continue current antihypertensive regimen.    5. Hyperlipidemia: LDL was 44 in 10/2022.  Continue Crestor .  6. Pancytopenia: Follows with hematology.   7. Disposition: Follow-up in 6 months with Dr. Nishan, sooner if needed.      Damien JAYSON Braver,  NP 08/20/2023, 2:07 PM

## 2023-08-24 ENCOUNTER — Encounter: Payer: Self-pay | Admitting: Nurse Practitioner

## 2023-08-28 ENCOUNTER — Ambulatory Visit (HOSPITAL_COMMUNITY)
Admission: RE | Admit: 2023-08-28 | Discharge: 2023-08-28 | Disposition: A | Discharge status: Home / Self Care | Source: Ambulatory Visit | Attending: Internal Medicine | Admitting: Internal Medicine

## 2023-08-28 DIAGNOSIS — R011 Cardiac murmur, unspecified: Secondary | ICD-10-CM | POA: Diagnosis not present

## 2023-08-28 DIAGNOSIS — I420 Dilated cardiomyopathy: Secondary | ICD-10-CM | POA: Diagnosis not present

## 2023-08-28 LAB — ECHOCARDIOGRAM COMPLETE
AR max vel: 2.11 cm2
AV Peak grad: 13.1 mmHg
Ao pk vel: 1.81 m/s
Area-P 1/2: 3.39 cm2
S' Lateral: 3.2 cm

## 2023-08-29 ENCOUNTER — Ambulatory Visit: Payer: Self-pay | Admitting: Nurse Practitioner

## 2023-09-05 ENCOUNTER — Other Ambulatory Visit: Payer: Self-pay

## 2023-09-05 DIAGNOSIS — D469 Myelodysplastic syndrome, unspecified: Secondary | ICD-10-CM

## 2023-09-07 NOTE — Progress Notes (Signed)
 HEMATOLOGY/ONCOLOGY CLINIC NOTE  Date of Service: 09/08/2023   Patient Care Team: Claudene Pellet, MD as PCP - General (Family Medicine) Delford Maude BROCKS, MD as PCP - Cardiology (Cardiology)  CHIEF COMPLAINTS/PURPOSE OF CONSULTATION:  Follow-up for continued evaluation and management of MDS with MF   HISTORY OF PRESENTING ILLNESS:  Please see previous note for details on initial presentation  Interval History:   Karen Waller is a 68 y.o. female who is here for continued evaluation and management of her MDS with myelofibrosis and associated cytopenias.   Patient was last seen by me on 06/09/2023 and reported insomnia, occasional localized gum bleed, one random bruise on her right arm, and two episodes of subconjunctival hemorrhage in the past two months.   She was seen by Dr. Kayla on 08/18/2023 for pre-transplant evaluation for stem cell transplant. Per Dr. Cheryle note, patient was inclined to continue to delay bone marrow transplant if feasible due to wanting longevity without concern for extra side effects from transplant.   Patient denies any new infections/signiifcant change in fatigue levels or signiifcant bleeding or brusing. No new medications. Labs were discussed in details.  MEDICAL HISTORY:  Past Medical History:  Diagnosis Date   Cardiomyopathy    EF 30-35% diagnosed 02/2008   Central obesity    Gout    left knee   HTN (hypertension)    Pancytopenia (HCC)     SURGICAL HISTORY: Past Surgical History:  Procedure Laterality Date   KNEE SURGERY     TONSILLECTOMY      SOCIAL HISTORY: Social History   Socioeconomic History   Marital status: Married    Spouse name: Not on file   Number of children: 2   Years of education: Not on file   Highest education level: Not on file  Occupational History   Occupation: [employer]  Tobacco Use   Smoking status: Former    Current packs/day: 0.00    Types: Cigarettes    Quit date: 02/19/1984    Years since  quitting: 39.5   Smokeless tobacco: Never  Vaping Use   Vaping status: Never Used  Substance and Sexual Activity   Alcohol use: Not Currently   Drug use: Never   Sexual activity: Not on file  Other Topics Concern   Not on file  Social History Narrative   Not on file   Social Drivers of Health   Financial Resource Strain: Not on file  Food Insecurity: Low Risk  (07/23/2023)   Received from [facility]   Hunger Vital Sign    Within the past 12 months, you worried that your food would run out before you got money to buy more: Never true    Within the past 12 months, the food you bought just didn't last and you didn't have money to get more. : Never true  Transportation Needs: No Transportation Needs (07/23/2023)   Received from [facility]    In the past 12 months, has lack of reliable transportation kept you from medical appointments, meetings, work or from getting things needed for daily living? : No  Physical Activity: Not on file  Stress: Not on file  Social Connections: Not on file  Intimate Partner Violence: Not on file    FAMILY HISTORY: Family History  Problem Relation Age of Onset   Hypertension Mother    Breast cancer Neg Hx     ALLERGIES:  is allergic to sulfa antibiotics, codeine, and simvastatin.  MEDICATIONS:  Current Outpatient Medications  Medication Sig Dispense Refill   acetaminophen  (TYLENOL ) 500 MG tablet Take 2 tablets (1,000 mg total) by mouth every 6 (six) hours as needed for mild pain (pain score 1-3). 60 tablet 0   allopurinol  (ZYLOPRIM ) 100 MG tablet TAKE 1 TABLET(100 MG) BY MOUTH DAILY 90 tablet 1   amLODipine  (NORVASC ) 10 MG tablet TAKE 1 TABLET(10 MG) BY MOUTH DAILY 90 tablet 3   B Complex Vitamins (B COMPLEX 100 PO) Take 1 tablet by mouth daily.     candesartan  (ATACAND ) 16 MG tablet Take 1 tablet (16 mg total) by mouth daily. 90 tablet 3   carvedilol  (COREG ) 6.25 MG tablet TAKE 1 TABLET BY MOUTH TWICE DAILY WITH A MEAL 180  tablet 3   Cholecalciferol 125 MCG (5000 UT) capsule Take 5,000 Units by mouth daily.     cyclobenzaprine  (FLEXERIL ) 10 MG tablet Take 1 tablet (10 mg total) by mouth 2 (two) times daily as needed for muscle spasms. 20 tablet 0   furosemide  (LASIX ) 20 MG tablet Take 1 tablet (20 mg total) by mouth daily. (Patient taking differently: Take 10 mg by mouth daily.) 90 tablet 3   hydrALAZINE  (APRESOLINE ) 25 MG tablet Take 1 tablet (25 mg total) by mouth 2 (two) times daily. 180 tablet 3   lidocaine  (XYLOCAINE ) 2 % solution Use as directed 15 mLs in the mouth or throat every 3 (three) hours as needed for mouth pain (Sore throat). (Patient not taking: Reported on 08/20/2023) 300 mL 0   methocarbamol  (ROBAXIN ) 500 MG tablet Take 500 mg by mouth as needed for muscle spasms.     Multiple Vitamins-Minerals (MULTIVITAMIN WITH MINERALS) tablet Take 1 tablet by mouth daily.     predniSONE  (DELTASONE ) 10 MG tablet Take 1 tablet (10 mg total) by mouth daily with breakfast. (Patient not taking: Reported on 08/20/2023) 4 tablet 0   rosuvastatin  (CRESTOR ) 5 MG tablet Take 1 tablet (5 mg total) by mouth daily. 90 tablet 3   tiZANidine  (ZANAFLEX ) 4 MG tablet Take 1 tablet (4 mg total) by mouth every 6 (six) hours as needed for muscle spasms. 30 tablet 0   traMADol  (ULTRAM ) 50 MG tablet Take 50 mg by mouth as needed.     No current facility-administered medications for this visit.    REVIEW OF SYSTEMS:    10 Point review of Systems was done is negative except as noted above.   PHYSICAL EXAMINATION: .BP 137/65   Pulse 65   Temp (!) 97.2 F (36.2 C)   Resp 20   Wt 197 lb 11.2 oz (89.7 kg)   SpO2 98%   BMI 31.91 kg/m   GENERAL:alert, in no acute distress and comfortable SKIN: no acute rashes, no significant lesions EYES: conjunctiva are pink and non-injected, sclera anicteric OROPHARYNX: MMM, no exudates, no oropharyngeal erythema or ulceration NECK: supple, no JVD LYMPH:  no palpable lymphadenopathy in the  cervical, axillary or inguinal regions LUNGS: clear to auscultation b/l with normal respiratory effort HEART: regular rate & rhythm ABDOMEN:  normoactive bowel sounds , non tender, not distended. Extremity: no pedal edema PSYCH: alert & oriented x 3 with fluent speech NEURO: no focal motor/sensory deficits   LABORATORY DATA:  I have reviewed the data as listed .SABRA    Latest Ref Rng & Units 09/08/2023   12:53 PM 06/09/2023   12:35 PM 03/07/2023    1:26 PM  CBC  WBC 4.0 - 10.5 K/uL 1.7  1.9  1.9   Hemoglobin 12.0 -  15.0 g/dL 9.8  9.6  9.6   Hematocrit 36.0 - 46.0 % 30.2  29.5  30.7   Platelets 150 - 400 K/uL 46  49  58   ANC 600 .    Latest Ref Rng & Units 09/08/2023   12:53 PM 06/09/2023   12:35 PM 03/07/2023    1:26 PM  CMP  Glucose 70 - 99 mg/dL 866  855  890   BUN 8 - 23 mg/dL 18  19  22    Creatinine 0.44 - 1.00 mg/dL 8.94  8.95  8.94   Sodium 135 - 145 mmol/L 142  142  140   Potassium 3.5 - 5.1 mmol/L 3.7  3.9  4.3   Chloride 98 - 111 mmol/L 110  111  110   CO2 22 - 32 mmol/L 26  23  24    Calcium  8.9 - 10.3 mg/dL 9.3  9.4  9.5   Total Protein 6.5 - 8.1 g/dL 7.0  6.8  6.9   Total Bilirubin 0.0 - 1.2 mg/dL 0.7  0.7  0.6   Alkaline Phos 38 - 126 U/L 69  63  70   AST 15 - 41 U/L 17  21  16    ALT 0 - 44 U/L 13  17  10        11/26/2018 BM Flow Pathology Report    11/26/2018 BM Bx Report    Surgical Pathology  CASE: [id number]  PATIENT: Karen Waller  Bone Marrow Report    Clinical History: pancytopenia, paraproteinemia, right iliac bone      DIAGNOSIS:   BONE MARROW, ASPIRATE, CLOT, CORE:  -Hypercellular bone marrow for age with trilineage hematopoiesis and  fibrosis.  See comment   PERIPHERAL BLOOD:  -Pancytopenia   COMMENT:   The aspirate, touch imprints, and clot sections are suboptimal which  hinders cytomorphologic evaluation.  The core biopsy is optimal and  shows a hypercellular bone marrow (90%) with a mixture of myeloid cell  lines.   This is associated with reticulin fibrosis.  A significant  CD34-positive blast population is not seen.  The myeloid changes are not  considered specific, and hence, it is unclear whether the findings  represent a primary myeloid neoplasm or represent secondary changes.  Correlation with cytogenetic studies is recommended.  There is no  evidence of lymphoproliferative process or diagnostic plasma cell  neoplasm.    11/26/2018 BM Cytogenetics:    RADIOGRAPHIC STUDIES: I have personally reviewed the radiological images as listed and agreed with the findings in the report. ECHOCARDIOGRAM COMPLETE Result Date: 08/28/2023    ECHOCARDIOGRAM REPORT   Patient Name:   NIKOLE SWARTZENTRUBER Date of Exam: 08/28/2023 Medical Rec #:  [id number]        Height:       66.0 in Accession #:    [id number]       Weight:       198.6 lb Date of Birth:  May 18, 1955        BSA:          1.994 m Patient Age:    68 years         BP:           131/78 mmHg Patient Gender: F                HR:           58 bpm. Exam Location:  [address] Procedure: 2D Echo, Cardiac Doppler, Color Doppler and Strain Analysis (Both  Spectral and Color Flow Doppler were utilized during procedure). Indications:    CMP I42.0                 Murmur R01.1  History:        Patient has prior history of Echocardiogram examinations.                 Cardiomyopathy, Signs/Symptoms:Murmur; Risk                 Factors:Hypertension.  Sonographer:    Cherene Ravens Referring Phys: [phone] EMILY C MONGE IMPRESSIONS  1. Left ventricular ejection fraction, by estimation, is 50 to 55%. The left ventricle has low normal function. The left ventricle has no regional wall motion abnormalities. The average left ventricular global longitudinal strain is -15.0 %. The global longitudinal strain is abnormal. Mild apical hypokinesis on limited windows.  2. Right ventricular systolic function is normal. The right ventricular size is normal.  3. The mitral valve is normal in  structure. No evidence of mitral valve regurgitation. No evidence of mitral stenosis.  4. The aortic valve is normal in structure. Aortic valve regurgitation is not visualized. No aortic stenosis is present.  5. The inferior vena cava is normal in size with greater than 50% respiratory variability, suggesting right atrial pressure of 3 mmHg. FINDINGS  Left Ventricle: Left ventricular ejection fraction, by estimation, is 50 to 55%. The left ventricle has low normal function. The left ventricle has no regional wall motion abnormalities. The average left ventricular global longitudinal strain is -15.0 %. Strain was performed and the global longitudinal strain is abnormal. The left ventricular internal cavity size was normal in size. There is no left ventricular hypertrophy. Left ventricular diastolic parameters are consistent with Grade I diastolic dysfunction (impaired relaxation).  LV Wall Scoring: The apex is hypokinetic. Right Ventricle: The right ventricular size is normal. No increase in right ventricular wall thickness. Right ventricular systolic function is normal. Left Atrium: Left atrial size was normal in size. Right Atrium: Right atrial size was normal in size. Pericardium: There is no evidence of pericardial effusion. Mitral Valve: The mitral valve is normal in structure. No evidence of mitral valve regurgitation. No evidence of mitral valve stenosis. Tricuspid Valve: The tricuspid valve is normal in structure. Tricuspid valve regurgitation is trivial. No evidence of tricuspid stenosis. Aortic Valve: The aortic valve is normal in structure. Aortic valve regurgitation is not visualized. No aortic stenosis is present. Aortic valve peak gradient measures 13.1 mmHg. Pulmonic Valve: The pulmonic valve was normal in structure. Pulmonic valve regurgitation is not visualized. No evidence of pulmonic stenosis. Aorta: The aortic root is normal in size and structure. Venous: The inferior vena cava is normal in size  with greater than 50% respiratory variability, suggesting right atrial pressure of 3 mmHg. IAS/Shunts: No atrial level shunt detected by color flow Doppler.  LEFT VENTRICLE PLAX 2D LVIDd:         4.40 cm   Diastology LVIDs:         3.20 cm   LV e' medial:    6.64 cm/s LV PW:         1.50 cm   LV E/e' medial:  13.3 LV IVS:        0.80 cm   LV e' lateral:   8.70 cm/s LVOT diam:     1.90 cm   LV E/e' lateral: 10.1 LV SV:         85 LV SV Index:  43        2D Longitudinal Strain LVOT Area:     2.84 cm  2D Strain GLS (A4C):   -14.9 %                          2D Strain GLS (A3C):   -15.0 %                          2D Strain GLS (A2C):   -15.0 %                          2D Strain GLS Avg:     -15.0 % RIGHT VENTRICLE             IVC RV Basal diam:  2.60 cm     IVC diam: 1.30 cm RV Mid diam:    2.10 cm RV S prime:     15.30 cm/s TAPSE (M-mode): 2.8 cm LEFT ATRIUM             Index LA diam:        3.80 cm 1.91 cm/m LA Vol (A2C):   47.0 ml 23.57 ml/m LA Vol (A4C):   53.3 ml 26.73 ml/m LA Biplane Vol: 53.1 ml 26.63 ml/m  AORTIC VALVE AV Area (Vmax): 2.11 cm AV Vmax:        181.00 cm/s AV Peak Grad:   13.1 mmHg LVOT Vmax:      135.00 cm/s LVOT Vmean:     87.200 cm/s LVOT VTI:       0.299 m  AORTA Ao Root diam: 2.90 cm Ao Asc diam:  2.90 cm MITRAL VALVE               TRICUSPID VALVE MV Area (PHT): 3.39 cm    TR Peak grad:   22.3 mmHg MV Decel Time: 224 msec    TR Vmax:        236.00 cm/s MV E velocity: 88.00 cm/s MV A velocity: 83.80 cm/s  SHUNTS MV E/A ratio:  1.05        Systemic VTI:  0.30 m                            Systemic Diam: 1.90 cm Aditya Sabharwal Electronically signed by Ria Commander Signature Date/Time: 08/28/2023/2:28:14 PM    Final      ASSESSMENT & PLAN:   68 y.o. female with  1. Minor Spontaneous?/undetected trauma causing bruising No ecchymosis with bruising  Clotting parameters normal from 06/17/17 with normal PT, aPTT and VWD panel and platelet function testing 01/08/18 PT and aPTT,  fibrinogen  WNL  2. MDS/MPN with fibrosis -- causing Pancytopenia --  -Previous bone marrow biopsy was done in October 2020 showing possible MDS/MPN overlap versus MDS with fibrosis.  No increase in blasts cytogenetics with deletion 20 q. NGS with only CBL mutation.  Oct 2020 BM Bx - hypercellular bone marrow with some reticulin fibrosis. 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 NGS molecular testing showed CBL mutation   PLAN:  -discussed lab results from today, 09/08/2023, in detail with patient -wbc counr 1.7k with ANC of 600 -stable, no infections -PLT counts #46k .no Evidence of bleeding -hgb of 9.8-- no indication for PRBC transfusion -B12 adequate Ferritin 56  with iron  saturation of 22% -will continue to monitor counts and  pursue supportive cares -continue f/u with Dr Kayla for transplant considerations  FOLLOW-UP:  RTC with Dr Onesimo with labs in 3 months   The total time spent in the appointment was 20 minutes* .  All of the patient's questions were answered with apparent satisfaction. The patient knows to call the clinic with any problems, questions or concerns.   Emaline Onesimo MD MS AAHIVMS [facility] Hematology/Oncology Physician [facility]  .*Total Encounter Time as defined by the Centers for Medicare and Medicaid Services includes, in addition to the face-to-face time of a patient visit (documented in the note above) non-face-to-face time: obtaining and reviewing outside history, ordering and reviewing medications, tests or procedures, care coordination (communications with other health care professionals or caregivers) and documentation in the medical record.    I,Mitra Faeizi,acting as a Neurosurgeon for Emaline Onesimo, MD.,have documented all relevant documentation on the behalf of Emaline Onesimo, MD,as directed by  Emaline Onesimo, MD while in the presence of Emaline Onesimo, MD.  .I have reviewed the above documentation for accuracy and  completeness, and I agree with the above. .Shayon Trompeter Kishore Jaber Dunlow MD

## 2023-09-08 ENCOUNTER — Inpatient Hospital Stay: Attending: Hematology

## 2023-09-08 ENCOUNTER — Inpatient Hospital Stay (HOSPITAL_BASED_OUTPATIENT_CLINIC_OR_DEPARTMENT_OTHER): Admitting: Hematology

## 2023-09-08 VITALS — BP 137/65 | HR 65 | Temp 97.2°F | Resp 20 | Wt 197.7 lb

## 2023-09-08 DIAGNOSIS — Z87891 Personal history of nicotine dependence: Secondary | ICD-10-CM | POA: Insufficient documentation

## 2023-09-08 DIAGNOSIS — D469 Myelodysplastic syndrome, unspecified: Secondary | ICD-10-CM | POA: Insufficient documentation

## 2023-09-08 DIAGNOSIS — D61818 Other pancytopenia: Secondary | ICD-10-CM

## 2023-09-08 LAB — CBC WITH DIFFERENTIAL (CANCER CENTER ONLY)
Abs Immature Granulocytes: 0 K/uL (ref 0.00–0.07)
Basophils Absolute: 0 K/uL (ref 0.0–0.1)
Basophils Relative: 0 %
Eosinophils Absolute: 0.1 K/uL (ref 0.0–0.5)
Eosinophils Relative: 3 %
HCT: 30.2 % — ABNORMAL LOW (ref 36.0–46.0)
Hemoglobin: 9.8 g/dL — ABNORMAL LOW (ref 12.0–15.0)
Lymphocytes Relative: 48 %
Lymphs Abs: 0.8 K/uL (ref 0.7–4.0)
MCH: 31.4 pg (ref 26.0–34.0)
MCHC: 32.5 g/dL (ref 30.0–36.0)
MCV: 96.8 fL (ref 80.0–100.0)
Monocytes Absolute: 0.3 K/uL (ref 0.1–1.0)
Monocytes Relative: 15 %
Neutro Abs: 0.6 K/uL — ABNORMAL LOW (ref 1.7–7.7)
Neutrophils Relative %: 34 %
Platelet Count: 46 K/uL — ABNORMAL LOW (ref 150–400)
RBC: 3.12 MIL/uL — ABNORMAL LOW (ref 3.87–5.11)
RDW: 17.9 % — ABNORMAL HIGH (ref 11.5–15.5)
WBC Count: 1.7 K/uL — ABNORMAL LOW (ref 4.0–10.5)
nRBC: 1 /100{WBCs} — ABNORMAL HIGH
nRBC: 2.4 % — ABNORMAL HIGH (ref 0.0–0.2)

## 2023-09-08 LAB — LACTATE DEHYDROGENASE: LDH: 191 U/L (ref 98–192)

## 2023-09-08 LAB — FERRITIN: Ferritin: 56 ng/mL (ref 11–307)

## 2023-09-08 LAB — CMP (CANCER CENTER ONLY)
ALT: 13 U/L (ref 0–44)
AST: 17 U/L (ref 15–41)
Albumin: 4.2 g/dL (ref 3.5–5.0)
Alkaline Phosphatase: 69 U/L (ref 38–126)
Anion gap: 6 (ref 5–15)
BUN: 18 mg/dL (ref 8–23)
CO2: 26 mmol/L (ref 22–32)
Calcium: 9.3 mg/dL (ref 8.9–10.3)
Chloride: 110 mmol/L (ref 98–111)
Creatinine: 1.05 mg/dL — ABNORMAL HIGH (ref 0.44–1.00)
GFR, Estimated: 58 mL/min — ABNORMAL LOW (ref >60)
Glucose, Bld: 133 mg/dL — ABNORMAL HIGH (ref 70–99)
Potassium: 3.7 mmol/L (ref 3.5–5.1)
Sodium: 142 mmol/L (ref 135–145)
Total Bilirubin: 0.7 mg/dL (ref 0.0–1.2)
Total Protein: 7 g/dL (ref 6.5–8.1)

## 2023-09-08 LAB — IRON AND IRON BINDING CAPACITY (CC-WL,HP ONLY)
Iron: 70 ug/dL (ref 28–170)
Saturation Ratios: 22 % (ref 10.4–31.8)
TIBC: 323 ug/dL (ref 250–450)
UIBC: 253 ug/dL (ref 148–442)

## 2023-09-08 LAB — VITAMIN B12: Vitamin B-12: 4873 pg/mL — ABNORMAL HIGH (ref 180–914)

## 2023-09-15 ENCOUNTER — Encounter: Payer: Self-pay | Admitting: Hematology

## 2023-10-02 ENCOUNTER — Ambulatory Visit: Attending: Physician Assistant | Admitting: Physical Therapy

## 2023-10-02 ENCOUNTER — Encounter: Payer: Self-pay | Admitting: Physical Therapy

## 2023-10-02 VITALS — BP 106/67 | HR 68

## 2023-10-02 DIAGNOSIS — R42 Dizziness and giddiness: Secondary | ICD-10-CM | POA: Diagnosis not present

## 2023-10-02 NOTE — Therapy (Signed)
 OUTPATIENT PHYSICAL THERAPY VESTIBULAR EVALUATION     Patient Name: Karen Waller MRN: [id number] DOB:[birth year], 68 y.o., female Today's Date: 10/02/2023  END OF SESSION:  PT End of Session - 10/02/23 0935     Visit Number 1    Number of Visits 5    Date for PT Re-Evaluation 11/01/23    Authorization Type [employer]    PT Start Time 0932    PT Stop Time 1012    PT Time Calculation (min) 40 min    Activity Tolerance Patient tolerated treatment well    Behavior During Therapy [facility] for tasks assessed/performed          Past Medical History:  Diagnosis Date   Cardiomyopathy    EF 30-35% diagnosed 02/2008   Central obesity    Gout    left knee   HTN (hypertension)    Pancytopenia (HCC)    Past Surgical History:  Procedure Laterality Date   KNEE SURGERY     TONSILLECTOMY     Patient Active Problem List   Diagnosis Date Noted   Interstitial cystitis 08/17/2022   Iron  deficiency anemia 06/22/2019   Pain in left hip 03/24/2018   Bruising, spontaneous 07/06/2017   Left Achilles tendinitis 11/08/2016   Conductive hearing loss, bilateral 08/08/2016   Elevated lipids 12/16/2012   HTN (hypertension) 01/05/2011   CHF (congestive heart failure) (HCC) 10/30/2010   Edema 10/30/2010   Gouty arthropathy 03/17/2008   OVERWEIGHT 03/17/2008   ESSENTIAL HYPERTENSION, BENIGN 03/17/2008   Non-ischemic cardiomyopathy (HCC) 03/17/2008   PALPITATIONS, OCCASIONAL 03/17/2008    PCP:  Claudene Pellet, MD   REFERRING PROVIDER: Benay Kay, PA-C  REFERRING DIAG: R42 (ICD-10-CM) - Dizziness  THERAPY DIAG:  Dizziness and giddiness  ONSET DATE: 09/17/2023   Rationale for Evaluation and Treatment: Rehabilitation  SUBJECTIVE:   SUBJECTIVE STATEMENT: Dizziness has been going on for 18 days. By the time her appt comes, it gets better. The spinning would bother her when she got in the bed. During the day will feel woozy and eventually it went away. Played top golf 2  weeks ago and the last time she felt spinning when she bent down to put the ball on the tee. Otherwise, pt has had no episodes in the past 2 weeks. Sometimes when she goes to exercise classes and looks up, she feels like it might be coming on. No spinning, just a feeling.  Pt accompanied by: self  PERTINENT HISTORY: PMH: HTN, myelodysplastic syndrome, CHF  PAIN:  Are you having pain? Every once in a while will have sharp pains in her legs or her R hip)  Vitals:   10/02/23 0945  BP: 106/67  Pulse: 68     PRECAUTIONS: None   FALLS: Has patient fallen in last 6 months? No  LIVING ENVIRONMENT: Lives with: lives with their spouse Lives in: House/apartment Stairs: Yes: Internal: 15 steps; on left going up and External: 6 steps; bilateral but cannot reach both Has following equipment at home: has a single crutch, would only use it for a gout attack   PLOF: Independent  PATIENT GOALS: Wants to be able to treat herself if she is having an episode  OBJECTIVE:  Note: Objective measures were completed at Evaluation unless otherwise noted.  DIAGNOSTIC FINDINGS: CT head 06/17/22: IMPRESSION: No evidence of acute abnormality intracranially or in the cervical spine.  COGNITION: Overall cognitive status: Within functional limits for tasks assessed    POSTURE:  rounded shoulders  Cervical ROM:  WNL    TRANSFERS: Assistive device utilized: None  Sit to stand: Complete Independence Stand to sit: Complete Independence    GAIT: Gait pattern: WFL Distance walked: Clinic distances  Assistive device utilized: None Level of assistance: Complete Independence Comments: No unsteadiness noted ambulating in and out of clinic      VESTIBULAR ASSESSMENT:  GENERAL OBSERVATION: Ambulates in with no AD independently    SYMPTOM BEHAVIOR:  Subjective history: See above   Non-Vestibular symptoms: sometimes would have some nausea   Type of dizziness: Spinning/Vertigo  Frequency:  last episode pt can remember was about 2 weeks ago   Duration: when would have an episode, would last 30 seconds   Aggravating factors: Induced by position change: lying supine and rolling to the right and Induced by motion: bending down to the ground  Relieving factors: keeping her eyes open  Progression of symptoms: better  OCULOMOTOR EXAM:  Ocular Alignment: normal  Ocular ROM: No Limitations  Spontaneous Nystagmus: absent  Gaze-Induced Nystagmus: absent  Smooth Pursuits: intact, reports felt a little woozy with eye movement   Saccades: intact  VESTIBULAR - OCULAR REFLEX:   Slow VOR: Normal  VOR Cancellation: Normal, pt reporting feeling like she was on a merry go round   Head-Impulse Test: HIT Right: negative HIT Left: negative pt reporting feeling like she was on a merry go round    POSITIONAL TESTING: Right Dix-Hallpike: no nystagmus Left Dix-Hallpike: no nystagmus Right Roll Test: no nystagmus Left Roll Test: no nystagmus Right Sidelying: no nystagmus and pt reporting feeling a kind of rollercoaster feeling when coming upright  Left Sidelying: no nystagmus                                                                                                                             TREATMENT DATE: 10/02/23  Self-Care: Provided BPPV education about etiology, treatment, reoccurrence as it appears that based on pt's symptom presentation did have BPPV before Discussed clinical findings today that it appears that pt has some lingering motion sensitivity, esp with Wilhelmena Carrel exercises when coming up from the R side. Provided Wilhelmena Carrel for home to work on habituation and motion sensitivity. Discussed that if BPPV does return in the future, then can also try Wilhelmena Carrel at home and that if it does not get better, then can get a referral from PCP or ENT to come into PT   Habituation: Brandt-Daroff: comment: performed 2 reps each side, pt reporting mild rollercoaster symptoms  when coming up right, but did improve and have less symptoms upon 2nd rep   PATIENT EDUCATION: Education details: See self-care above, will schedule one follow up appt  Person educated: Patient Education method: Explanation, Demonstration, Verbal cues, and Handouts Education comprehension: verbalized understanding and returned demonstration  HOME EXERCISE PROGRAM: Access Code: [email or web address] URL: [email or web address] Date: 10/02/2023 Prepared by: Sheffield Senate  Exercises - Brandt-Daroff Vestibular Exercise  - 1-2 x daily - 7 x weekly - 4-5  reps   GOALS: Goals reviewed with patient? Yes  SHORT TERM GOALS: ALL STGS = LTGS  LONG TERM GOALS: Target date: 10/30/2023  Pt will report no dizziness with bed mobility, esp with supine > sit.  Baseline:  Goal status: INITIAL  2.  Further vestibular testing to be performed as appropriate with LTG written if needed.  Baseline:  Goal status: INITIAL   ASSESSMENT:  CLINICAL IMPRESSION: Patient is a 68 year old female referred to Neuro OPPT for dizziness.   Pt's PMH is significant for: HTN, myelodysplastic syndrome. The following deficits were present during the exam: incr dizziness when coming up from R sidelying and pt reporting feeling like she was on a merry go round after assessment of HIT. Based on pt's history, it sounds like pt did have BPPV prior, but it has then resolved. Provided pt with Wilhelmena Carrel exercises to address lingering motion sensitivity with pt reporting less symptoms to the R after 2 reps. Will schedule one further follow-up appt to further address dizziness as needed.   OBJECTIVE IMPAIRMENTS: decreased activity tolerance and dizziness.   ACTIVITY LIMITATIONS: bed mobility, bending  PARTICIPATION LIMITATIONS: N/A  PERSONAL FACTORS: Past/current experiences, Time since onset of injury/illness/exacerbation, and 1-2 comorbidities: HTN, myelodysplastic syndrome  are also affecting patient's functional  outcome.   REHAB POTENTIAL: Good  CLINICAL DECISION MAKING: Stable/uncomplicated  EVALUATION COMPLEXITY: Low   PLAN:  PT FREQUENCY: 1x/week  PT DURATION: 4 weeks  PLANNED INTERVENTIONS: [id number]- PT Re-evaluation, 97110-Therapeutic exercises, 97530- Therapeutic activity, [id number]- Neuromuscular re-education, 97535- Self Care, [id number]- Manual therapy, [phone]- Canalith repositioning, Patient/Family education, Balance training, and Vestibular training  PLAN FOR NEXT SESSION: how is dizziness? Perform further assessment as needed, might need some VOR exercises?    Sheffield LOISE Senate, PT, DPT 10/02/2023, 10:17 AM

## 2023-10-15 ENCOUNTER — Ambulatory Visit: Admitting: Physical Therapy

## 2023-11-08 ENCOUNTER — Encounter: Payer: Self-pay | Admitting: Hematology

## 2023-11-12 DIAGNOSIS — E782 Mixed hyperlipidemia: Secondary | ICD-10-CM | POA: Diagnosis not present

## 2023-11-12 DIAGNOSIS — I10 Essential (primary) hypertension: Secondary | ICD-10-CM | POA: Diagnosis not present

## 2023-11-12 DIAGNOSIS — M109 Gout, unspecified: Secondary | ICD-10-CM | POA: Diagnosis not present

## 2023-11-12 DIAGNOSIS — Z1331 Encounter for screening for depression: Secondary | ICD-10-CM | POA: Diagnosis not present

## 2023-11-12 DIAGNOSIS — Z0000 Encounter for general adult medical examination without abnormal findings: Secondary | ICD-10-CM | POA: Diagnosis not present

## 2023-11-12 DIAGNOSIS — Z23 Encounter for immunization: Secondary | ICD-10-CM | POA: Diagnosis not present

## 2023-11-12 DIAGNOSIS — D696 Thrombocytopenia, unspecified: Secondary | ICD-10-CM | POA: Diagnosis not present

## 2023-11-12 DIAGNOSIS — D469 Myelodysplastic syndrome, unspecified: Secondary | ICD-10-CM | POA: Diagnosis not present

## 2023-11-12 DIAGNOSIS — E559 Vitamin D deficiency, unspecified: Secondary | ICD-10-CM | POA: Diagnosis not present

## 2023-11-13 DIAGNOSIS — C946 Myelodysplastic disease, not classified: Secondary | ICD-10-CM | POA: Diagnosis not present

## 2023-11-26 DIAGNOSIS — H35361 Drusen (degenerative) of macula, right eye: Secondary | ICD-10-CM | POA: Diagnosis not present

## 2023-11-28 DIAGNOSIS — C946 Myelodysplastic disease, not classified: Secondary | ICD-10-CM | POA: Diagnosis not present

## 2023-12-05 ENCOUNTER — Other Ambulatory Visit: Payer: Self-pay

## 2023-12-05 DIAGNOSIS — D61818 Other pancytopenia: Secondary | ICD-10-CM

## 2023-12-05 DIAGNOSIS — D469 Myelodysplastic syndrome, unspecified: Secondary | ICD-10-CM

## 2023-12-06 ENCOUNTER — Emergency Department (HOSPITAL_COMMUNITY)

## 2023-12-06 ENCOUNTER — Emergency Department (HOSPITAL_COMMUNITY)
Admission: EM | Admit: 2023-12-06 | Discharge: 2023-12-06 | Disposition: A | Discharge status: Home / Self Care | Attending: Emergency Medicine | Admitting: Emergency Medicine

## 2023-12-06 ENCOUNTER — Encounter (HOSPITAL_COMMUNITY): Payer: Self-pay

## 2023-12-06 ENCOUNTER — Other Ambulatory Visit: Payer: Self-pay

## 2023-12-06 DIAGNOSIS — I10 Essential (primary) hypertension: Secondary | ICD-10-CM | POA: Diagnosis not present

## 2023-12-06 DIAGNOSIS — R350 Frequency of micturition: Secondary | ICD-10-CM | POA: Diagnosis present

## 2023-12-06 DIAGNOSIS — R300 Dysuria: Secondary | ICD-10-CM | POA: Diagnosis not present

## 2023-12-06 DIAGNOSIS — Z79899 Other long term (current) drug therapy: Secondary | ICD-10-CM | POA: Diagnosis not present

## 2023-12-06 DIAGNOSIS — I700 Atherosclerosis of aorta: Secondary | ICD-10-CM | POA: Diagnosis not present

## 2023-12-06 DIAGNOSIS — K8020 Calculus of gallbladder without cholecystitis without obstruction: Secondary | ICD-10-CM | POA: Insufficient documentation

## 2023-12-06 DIAGNOSIS — K5730 Diverticulosis of large intestine without perforation or abscess without bleeding: Secondary | ICD-10-CM | POA: Diagnosis not present

## 2023-12-06 LAB — CBC
HCT: 32.6 % — ABNORMAL LOW (ref 36.0–46.0)
Hemoglobin: 9.9 g/dL — ABNORMAL LOW (ref 12.0–15.0)
MCH: 30.6 pg (ref 26.0–34.0)
MCHC: 30.4 g/dL (ref 30.0–36.0)
MCV: 100.6 fL — ABNORMAL HIGH (ref 80.0–100.0)
Platelets: 38 K/uL — ABNORMAL LOW (ref 150–400)
RBC: 3.24 MIL/uL — ABNORMAL LOW (ref 3.87–5.11)
RDW: 18.8 % — ABNORMAL HIGH (ref 11.5–15.5)
WBC: 2.2 K/uL — ABNORMAL LOW (ref 4.0–10.5)
nRBC: 1.4 % — ABNORMAL HIGH (ref 0.0–0.2)

## 2023-12-06 LAB — URINALYSIS, ROUTINE W REFLEX MICROSCOPIC
Bacteria, UA: NONE SEEN
Bilirubin Urine: NEGATIVE
Glucose, UA: NEGATIVE mg/dL
Ketones, ur: NEGATIVE mg/dL
Leukocytes,Ua: NEGATIVE
Nitrite: NEGATIVE
Protein, ur: NEGATIVE mg/dL
Specific Gravity, Urine: 1.005 (ref 1.005–1.030)
pH: 5 (ref 5.0–8.0)

## 2023-12-06 LAB — BASIC METABOLIC PANEL WITH GFR
Anion gap: 13 (ref 5–15)
BUN: 19 mg/dL (ref 8–23)
CO2: 21 mmol/L — ABNORMAL LOW (ref 22–32)
Calcium: 9.7 mg/dL (ref 8.9–10.3)
Chloride: 107 mmol/L (ref 98–111)
Creatinine, Ser: 0.96 mg/dL (ref 0.44–1.00)
GFR, Estimated: >60 mL/min (ref >60)
Glucose, Bld: 112 mg/dL — ABNORMAL HIGH (ref 70–99)
Potassium: 4.2 mmol/L (ref 3.5–5.1)
Sodium: 141 mmol/L (ref 135–145)

## 2023-12-06 NOTE — ED Provider Notes (Signed)
 [place] EMERGENCY DEPARTMENT AT [facility] Provider Note   CSN: [id number] Arrival date & time: 12/06/23  0134     Patient presents with: Urinary Frequency   Karen Waller is a 68 y.o. female.    Urinary Frequency  Patient presents with urinary frequency.  Has had for around 2 days.  States no history of UTI.  No other symptoms per patient but with further history is had some pain in her left flank.  No fevers or chills.  Does have history of myelodysplastic syndrome.    Past Medical History:  Diagnosis Date   Cardiomyopathy    EF 30-35% diagnosed 02/2008   Central obesity    Gout    left knee   HTN (hypertension)    Pancytopenia (HCC)     Prior to Admission medications   Medication Sig Start Date End Date Taking? Authorizing Provider  acetaminophen  (TYLENOL ) 500 MG tablet Take 2 tablets (1,000 mg total) by mouth every 6 (six) hours as needed for mild pain (pain score 1-3). 12/21/22   Blitch, Marval HERO, NP  allopurinol  (ZYLOPRIM ) 100 MG tablet TAKE 1 TABLET(100 MG) BY MOUTH DAILY 06/27/23   Onesimo Emaline Brink, MD  amLODipine  (NORVASC ) 10 MG tablet TAKE 1 TABLET(10 MG) BY MOUTH DAILY 12/19/22   Conte, Tessa N, PA-C  B Complex Vitamins (B COMPLEX 100 PO) Take 1 tablet by mouth daily.    [provider]  candesartan  (ATACAND ) 16 MG tablet Take 1 tablet (16 mg total) by mouth daily. 12/19/22   Conte, Tessa N, PA-C  carvedilol  (COREG ) 6.25 MG tablet TAKE 1 TABLET BY MOUTH TWICE DAILY WITH A MEAL 12/10/22   Nishan, Peter C, MD  Cholecalciferol 125 MCG (5000 UT) capsule Take 5,000 Units by mouth daily.    [provider]  cyclobenzaprine  (FLEXERIL ) 10 MG tablet Take 1 tablet (10 mg total) by mouth 2 (two) times daily as needed for muscle spasms. 06/17/22   Dreama Longs, MD  furosemide  (LASIX ) 20 MG tablet Take 1 tablet (20 mg total) by mouth daily. Patient taking differently: Take 10 mg by mouth daily. 12/19/22   Lucien Orren SAILOR, PA-C   hydrALAZINE  (APRESOLINE ) 25 MG tablet Take 1 tablet (25 mg total) by mouth 2 (two) times daily. 12/19/22   Lucien Orren SAILOR, PA-C  lidocaine  (XYLOCAINE ) 2 % solution Use as directed 15 mLs in the mouth or throat every 3 (three) hours as needed for mouth pain (Sore throat). Patient not taking: Reported on 08/20/2023 01/04/22   Joesph Shaver Scales, PA-C  methocarbamol  (ROBAXIN ) 500 MG tablet Take 500 mg by mouth as needed for muscle spasms. 06/21/20   [provider]  Multiple Vitamins-Minerals (MULTIVITAMIN WITH MINERALS) tablet Take 1 tablet by mouth daily.    [provider]  predniSONE  (DELTASONE ) 10 MG tablet Take 1 tablet (10 mg total) by mouth daily with breakfast. Patient not taking: Reported on 08/20/2023 12/21/22   Sumner Marval HERO, NP  rosuvastatin  (CRESTOR ) 5 MG tablet Take 1 tablet (5 mg total) by mouth daily. 12/19/22   Conte, Tessa N, PA-C  tiZANidine  (ZANAFLEX ) 4 MG tablet Take 1 tablet (4 mg total) by mouth every 6 (six) hours as needed for muscle spasms. 09/01/21   Molpus, John, MD  traMADol  (ULTRAM ) 50 MG tablet Take 50 mg by mouth as needed. 03/23/21   [provider]    Allergies: Sulfa antibiotics, Codeine, and Simvastatin    Review of Systems  Genitourinary:  Positive for frequency.  Updated Vital Signs BP 117/64   Pulse 60   Temp 98.2 F (36.8 C)   Resp 16   Ht 5' 6 (1.676 m)   Wt 90 kg   SpO2 100%   BMI 32.02 kg/m   Physical Exam Vitals and nursing note reviewed.  HENT:     Head: Normocephalic.  Cardiovascular:     Rate and Rhythm: Regular rhythm.  Abdominal:     Tenderness: There is no abdominal tenderness.  Genitourinary:    Comments: Does have some tenderness in the left flank/CVA area. Neurological:     Mental Status: She is alert.     (all labs ordered are listed, but only abnormal results are displayed) Labs Reviewed  URINALYSIS, ROUTINE W REFLEX MICROSCOPIC - Abnormal; Notable for the following components:       Result Value   Color, Urine STRAW (*)    Hgb urine dipstick SMALL (*)    All other components within normal limits  BASIC METABOLIC PANEL WITH GFR - Abnormal; Notable for the following components:   CO2 21 (*)    Glucose, Bld 112 (*)    All other components within normal limits  CBC - Abnormal; Notable for the following components:   WBC 2.2 (*)    RBC 3.24 (*)    Hemoglobin 9.9 (*)    HCT 32.6 (*)    MCV 100.6 (*)    RDW 18.8 (*)    Platelets 38 (*)    nRBC 1.4 (*)    All other components within normal limits  URINE CULTURE    EKG: None  Radiology: CT Renal Stone Study Result Date: 12/06/2023 CLINICAL DATA:  Urinary frequency with possibly TI. EXAM: CT ABDOMEN AND PELVIS WITHOUT CONTRAST TECHNIQUE: Multidetector CT imaging of the abdomen and pelvis was performed following the standard protocol without IV contrast. RADIATION DOSE REDUCTION: This exam was performed according to the departmental dose-optimization program which includes automated exposure control, adjustment of the mA and/or kV according to patient size and/or use of iterative reconstruction technique. COMPARISON:  None Available. FINDINGS: Lower chest: Unremarkable. Hepatobiliary: No suspicious focal abnormality in the liver on this study without intravenous contrast. Noncalcified cholesterol stones are seen in the gallbladder. No intrahepatic or extrahepatic biliary dilation. Pancreas: No focal mass lesion. No dilatation of the main duct. No intraparenchymal cyst. No peripancreatic edema. Spleen: No splenomegaly. No suspicious focal mass lesion. Adrenals/Urinary Tract: No adrenal nodule or mass. Kidneys unremarkable. No evidence for hydroureter. The urinary bladder appears normal for the degree of distention. Stomach/Bowel: Stomach is unremarkable. No gastric wall thickening. No evidence of outlet obstruction. Duodenum is normally positioned as is the ligament of Treitz. No small bowel wall thickening. No small bowel  dilatation. The terminal ileum is normal. The appendix is normal. No gross colonic mass. No colonic wall thickening. Diverticular changes are noted in the left colon without evidence of diverticulitis. Vascular/Lymphatic: There is mild atherosclerotic calcification of the abdominal aorta without aneurysm. There is no gastrohepatic or hepatoduodenal ligament lymphadenopathy. No retroperitoneal or mesenteric lymphadenopathy. No pelvic sidewall lymphadenopathy. Reproductive: The uterus is unremarkable.  There is no adnexal mass. Other: No intraperitoneal free fluid. Musculoskeletal: No worrisome lytic or sclerotic osseous abnormality. IMPRESSION: 1. No acute findings in the abdomen or pelvis. Specifically, no findings to explain the patient's history of urinary frequency. No urinary stone disease. No secondary changes in either kidney or ureter. 2. Cholelithiasis. 3. Left colonic diverticulosis without diverticulitis. 4.  Aortic Atherosclerosis (ICD10-I70.0). Electronically Signed   By:  Camellia Candle M.D.   On: 12/06/2023 08:47     Procedures   Medications Ordered in the ED - No data to display                                  Medical Decision Making Amount and/or Complexity of Data Reviewed Labs: ordered. Radiology: ordered.   Patient was left flank pain dysuria.  Differential diagnosis does include UTI, kidney stone, even other causes such as retroperitoneal bleed because of her chronic thrombocytopenia.  Blood work appears to be at her baseline.  Urine did have small amount of blood but no infection.  Does not have any other symptoms such as vaginal discharge.  Will get CT scan evaluate other causes of flank pain and with hematuria.  CT scan reassuring.  No clear cause.  Does not appear to have definite infection this time, however with her immunosuppression due to her MDS will send culture.  With symptoms we will treat if the culture came back positive.  Did have some blood will need to  follow-up with PCP.  Will discharge.     Final diagnoses:  Dysuria    ED Discharge Orders     None          Patsey Lot, MD 12/06/23 [phone]

## 2023-12-06 NOTE — ED Triage Notes (Signed)
 Pt to ED from home with c/o possible UTI which started 2 days ago. Pt endorses urinary frequency, denies any other symptoms. Pt has a history of MDS. Arrives A+O, VSS, NADN, denies any pain.

## 2023-12-07 LAB — URINE CULTURE: Culture: NO GROWTH

## 2023-12-08 ENCOUNTER — Inpatient Hospital Stay: Admitting: Hematology

## 2023-12-08 ENCOUNTER — Inpatient Hospital Stay: Attending: Hematology

## 2023-12-08 ENCOUNTER — Telehealth: Payer: Self-pay | Admitting: Hematology

## 2023-12-08 VITALS — BP 139/71 | HR 64 | Temp 97.7°F | Resp 17 | Ht 66.0 in | Wt 196.0 lb

## 2023-12-08 DIAGNOSIS — Z87891 Personal history of nicotine dependence: Secondary | ICD-10-CM | POA: Insufficient documentation

## 2023-12-08 DIAGNOSIS — D469 Myelodysplastic syndrome, unspecified: Secondary | ICD-10-CM | POA: Insufficient documentation

## 2023-12-08 DIAGNOSIS — R350 Frequency of micturition: Secondary | ICD-10-CM | POA: Diagnosis not present

## 2023-12-08 DIAGNOSIS — R7989 Other specified abnormal findings of blood chemistry: Secondary | ICD-10-CM | POA: Diagnosis not present

## 2023-12-08 DIAGNOSIS — D61818 Other pancytopenia: Secondary | ICD-10-CM

## 2023-12-08 DIAGNOSIS — M109 Gout, unspecified: Secondary | ICD-10-CM | POA: Diagnosis not present

## 2023-12-08 LAB — CMP (CANCER CENTER ONLY)
ALT: 14 U/L (ref 0–44)
AST: 19 U/L (ref 15–41)
Albumin: 4.4 g/dL (ref 3.5–5.0)
Alkaline Phosphatase: 72 U/L (ref 38–126)
Anion gap: 6 (ref 5–15)
BUN: 17 mg/dL (ref 8–23)
CO2: 27 mmol/L (ref 22–32)
Calcium: 10 mg/dL (ref 8.9–10.3)
Chloride: 110 mmol/L (ref 98–111)
Creatinine: 1.17 mg/dL — ABNORMAL HIGH (ref 0.44–1.00)
GFR, Estimated: 51 mL/min — ABNORMAL LOW (ref >60)
Glucose, Bld: 77 mg/dL (ref 70–99)
Potassium: 3.8 mmol/L (ref 3.5–5.1)
Sodium: 143 mmol/L (ref 135–145)
Total Bilirubin: 0.6 mg/dL (ref 0.0–1.2)
Total Protein: 7.1 g/dL (ref 6.5–8.1)

## 2023-12-08 LAB — CBC WITH DIFFERENTIAL (CANCER CENTER ONLY)
Abs Immature Granulocytes: 0 K/uL (ref 0.00–0.07)
Basophils Absolute: 0 K/uL (ref 0.0–0.1)
Basophils Relative: 0 %
Eosinophils Absolute: 0 K/uL (ref 0.0–0.5)
Eosinophils Relative: 0 %
HCT: 29.4 % — ABNORMAL LOW (ref 36.0–46.0)
Hemoglobin: 9.5 g/dL — ABNORMAL LOW (ref 12.0–15.0)
Immature Granulocytes: 0 %
Lymphocytes Relative: 57 %
Lymphs Abs: 1.1 K/uL (ref 0.7–4.0)
MCH: 31.7 pg (ref 26.0–34.0)
MCHC: 32.3 g/dL (ref 30.0–36.0)
MCV: 98 fL (ref 80.0–100.0)
Monocytes Absolute: 0.3 K/uL (ref 0.1–1.0)
Monocytes Relative: 15 %
Neutro Abs: 0.5 K/uL — ABNORMAL LOW (ref 1.7–7.7)
Neutrophils Relative %: 28 %
Platelet Count: 41 K/uL — ABNORMAL LOW (ref 150–400)
RBC: 3 MIL/uL — ABNORMAL LOW (ref 3.87–5.11)
RDW: 18.5 % — ABNORMAL HIGH (ref 11.5–15.5)
Smear Review: NORMAL
WBC Count: 1.9 K/uL — ABNORMAL LOW (ref 4.0–10.5)
nRBC: 1.6 % — ABNORMAL HIGH (ref 0.0–0.2)

## 2023-12-08 LAB — FERRITIN: Ferritin: 55 ng/mL (ref 11–307)

## 2023-12-08 LAB — VITAMIN B12: Vitamin B-12: >4000 pg/mL — ABNORMAL HIGH (ref 180–914)

## 2023-12-08 LAB — IRON AND IRON BINDING CAPACITY (CC-WL,HP ONLY)
Iron: 72 ug/dL (ref 28–170)
Saturation Ratios: 22 % (ref 10.4–31.8)
TIBC: 330 ug/dL (ref 250–450)
UIBC: 258 ug/dL (ref 148–442)

## 2023-12-08 LAB — LACTATE DEHYDROGENASE: LDH: 191 U/L (ref 98–192)

## 2023-12-08 NOTE — Progress Notes (Signed)
 HEMATOLOGY/ONCOLOGY CLINIC NOTE  Date of Service: 09/08/2023   Patient Care Team: Claudene Pellet, MD as PCP - General (Family Medicine) Delford Maude BROCKS, MD as PCP - Cardiology (Cardiology)  CHIEF COMPLAINTS/PURPOSE OF CONSULTATION:  Follow-up for continued evaluation and management of MDS with MF   HISTORY OF PRESENTING ILLNESS:  Please see previous note for details on initial presentation  Interval History:   Karen Waller is a 68 y.o. female who is here for continued evaluation and management of her MDS with myelofibrosis and associated cytopenias. Accompanied by her daughters today, physically and over phonecall.  Patient was last seen by me on 09/08/2023 and was also seen by Dr. Kayla on 08/18/2023 for pre-transplant evaluation for stem cell transplant. Per Dr. Cheryle note, patient was inclined to continue to delay bone marrow transplant if feasible due to wanting longevity without concern for extra side effects from transplant. Otherwise she was doing well without concerns.  Today, she says that she was feeling well until last week, noting the onset of frequent urination, burning dysuria, and had noticed some hematuria, which improved yesterday. She says that she saw her PCP who suggested that she may be retaining urine, which she has previously been tested and was normal though she does feel like she is. Denies concern for uterine prolapse or cystocele.   Otherwise denies any new/frequent infection issues, fevers/chills, drenching night sweats, new bone pains, new lumps/bumps, chest pain, dizziness, or SOB. She does endorse having some gum bleeds with clots, which have been evaluated by her Dentist and has been ongoing for 2-3 years in the same area - does have a bridge across the area.   States she is UTD on age-recommended vaccinations.   MEDICAL HISTORY:  Past Medical History:  Diagnosis Date   Cardiomyopathy    EF 30-35% diagnosed 02/2008   Central obesity    Gout     left knee   HTN (hypertension)    Pancytopenia (HCC)     SURGICAL HISTORY: Past Surgical History:  Procedure Laterality Date   KNEE SURGERY     TONSILLECTOMY      SOCIAL HISTORY: Social History   Socioeconomic History   Marital status: Married    Spouse name: Not on file   Number of children: 2   Years of education: Not on file   Highest education level: Not on file  Occupational History   Occupation: [employer]  Tobacco Use   Smoking status: Former    Current packs/day: 0.00    Types: Cigarettes    Quit date: 02/19/1984    Years since quitting: 39.8   Smokeless tobacco: Never  Vaping Use   Vaping status: Never Used  Substance and Sexual Activity   Alcohol use: Not Currently   Drug use: Never   Sexual activity: Not on file  Other Topics Concern   Not on file  Social History Narrative   Not on file   Social Drivers of Health   Financial Resource Strain: Not on file  Food Insecurity: Low Risk  (07/23/2023)   Received from [facility]   Hunger Vital Sign    Within the past 12 months, you worried that your food would run out before you got money to buy more: Never true    Within the past 12 months, the food you bought just didn't last and you didn't have money to get more. : Never true  Transportation Needs: No Transportation Needs (07/23/2023)   Received from [facility]  Transportation    In the past 12 months, has lack of reliable transportation kept you from medical appointments, meetings, work or from getting things needed for daily living? : No  Physical Activity: Not on file  Stress: Not on file  Social Connections: Not on file  Intimate Partner Violence: Not on file    FAMILY HISTORY: Family History  Problem Relation Age of Onset   Hypertension Mother    Breast cancer Neg Hx     ALLERGIES:  is allergic to sulfa antibiotics, codeine, and simvastatin.  MEDICATIONS:  Current Outpatient Medications  Medication Sig Dispense Refill   acetaminophen   (TYLENOL ) 500 MG tablet Take 2 tablets (1,000 mg total) by mouth every 6 (six) hours as needed for mild pain (pain score 1-3). 60 tablet 0   allopurinol  (ZYLOPRIM ) 100 MG tablet TAKE 1 TABLET(100 MG) BY MOUTH DAILY 90 tablet 1   amLODipine  (NORVASC ) 10 MG tablet TAKE 1 TABLET(10 MG) BY MOUTH DAILY 90 tablet 3   B Complex Vitamins (B COMPLEX 100 PO) Take 1 tablet by mouth daily.     candesartan  (ATACAND ) 16 MG tablet Take 1 tablet (16 mg total) by mouth daily. 90 tablet 3   carvedilol  (COREG ) 6.25 MG tablet TAKE 1 TABLET BY MOUTH TWICE DAILY WITH A MEAL 180 tablet 3   Cholecalciferol 125 MCG (5000 UT) capsule Take 5,000 Units by mouth daily.     cyclobenzaprine  (FLEXERIL ) 10 MG tablet Take 1 tablet (10 mg total) by mouth 2 (two) times daily as needed for muscle spasms. 20 tablet 0   furosemide  (LASIX ) 20 MG tablet Take 1 tablet (20 mg total) by mouth daily. (Patient taking differently: Take 10 mg by mouth daily.) 90 tablet 3   hydrALAZINE  (APRESOLINE ) 25 MG tablet Take 1 tablet (25 mg total) by mouth 2 (two) times daily. 180 tablet 3   lidocaine  (XYLOCAINE ) 2 % solution Use as directed 15 mLs in the mouth or throat every 3 (three) hours as needed for mouth pain (Sore throat). (Patient not taking: Reported on 08/20/2023) 300 mL 0   methocarbamol  (ROBAXIN ) 500 MG tablet Take 500 mg by mouth as needed for muscle spasms.     Multiple Vitamins-Minerals (MULTIVITAMIN WITH MINERALS) tablet Take 1 tablet by mouth daily.     predniSONE  (DELTASONE ) 10 MG tablet Take 1 tablet (10 mg total) by mouth daily with breakfast. (Patient not taking: Reported on 08/20/2023) 4 tablet 0   rosuvastatin  (CRESTOR ) 5 MG tablet Take 1 tablet (5 mg total) by mouth daily. 90 tablet 3   tiZANidine  (ZANAFLEX ) 4 MG tablet Take 1 tablet (4 mg total) by mouth every 6 (six) hours as needed for muscle spasms. 30 tablet 0   traMADol  (ULTRAM ) 50 MG tablet Take 50 mg by mouth as needed.     No current facility-administered medications for  this visit.    REVIEW OF SYSTEMS:    10 Point review of Systems was done is negative except as noted above.   PHYSICAL EXAMINATION: .BP 139/71 (BP Location: Left Arm, Patient Position: Sitting)   Pulse 64   Temp 97.7 F (36.5 C) (Temporal)   Resp 17   Ht 5' 6 (1.676 m)   Wt 196 lb (88.9 kg)   SpO2 98%   BMI 31.64 kg/m   GENERAL:alert, in no acute distress and comfortable SKIN: no acute rashes, no significant lesions EYES: conjunctiva are pink and non-injected, sclera anicteric OROPHARYNX: MMM, no exudates, no oropharyngeal erythema or ulceration NECK: supple, no JVD  LYMPH:  no palpable lymphadenopathy in the cervical, axillary or inguinal regions LUNGS: clear to auscultation b/l with normal respiratory effort HEART: regular rate & rhythm ABDOMEN:  normoactive bowel sounds , non tender, not distended. Extremity: no pedal edema PSYCH: alert & oriented x 3 with fluent speech NEURO: no focal motor/sensory deficits   LABORATORY DATA:  I have reviewed the data as listed .SABRA    Latest Ref Rng & Units 12/08/2023   12:44 PM 12/06/2023    2:13 AM 09/08/2023   12:53 PM  CBC EXTENDED  WBC 4.0 - 10.5 K/uL 1.9  2.2  1.7   RBC 3.87 - 5.11 MIL/uL 3.00  3.24  3.12   Hemoglobin 12.0 - 15.0 g/dL 9.5  9.9  9.8   HCT 63.9 - 46.0 % 29.4  32.6  30.2   Platelets 150 - 400 K/uL 41  38  46   NEUT# 1.7 - 7.7 K/uL 0.5   0.6   Lymph# 0.7 - 4.0 K/uL 1.1   0.8       Latest Ref Rng & Units 12/08/2023   12:44 PM 12/06/2023    2:13 AM 09/08/2023   12:53 PM  CMP  Glucose 70 - 99 mg/dL 77  887  866   BUN 8 - 23 mg/dL 17  19  18    Creatinine 0.44 - 1.00 mg/dL 8.82  9.03  8.94   Sodium 135 - 145 mmol/L 143  141  142   Potassium 3.5 - 5.1 mmol/L 3.8  4.2  3.7   Chloride 98 - 111 mmol/L 110  107  110   CO2 22 - 32 mmol/L 27  21  26    Calcium  8.9 - 10.3 mg/dL 89.9  9.7  9.3   Total Protein 6.5 - 8.1 g/dL 7.1   7.0   Total Bilirubin 0.0 - 1.2 mg/dL 0.6   0.7   Alkaline Phos 38 - 126 U/L 72   69    AST 15 - 41 U/L 19   17   ALT 0 - 44 U/L 14   13       11/26/2018 BM Flow Pathology Report    11/26/2018 BM Bx Report    Surgical Pathology  CASE: [id number]  PATIENT: [place] Y[place]  Bone Marrow Report    Clinical History: pancytopenia, paraproteinemia, right iliac bone      DIAGNOSIS:   BONE MARROW, ASPIRATE, CLOT, CORE:  -Hypercellular bone marrow for age with trilineage hematopoiesis and  fibrosis.  See comment   PERIPHERAL BLOOD:  -Pancytopenia   COMMENT:   The aspirate, touch imprints, and clot sections are suboptimal which  hinders cytomorphologic evaluation.  The core biopsy is optimal and  shows a hypercellular bone marrow (90%) with a mixture of myeloid cell  lines.  This is associated with reticulin fibrosis.  A significant  CD34-positive blast population is not seen.  The myeloid changes are not  considered specific, and hence, it is unclear whether the findings  represent a primary myeloid neoplasm or represent secondary changes.  Correlation with cytogenetic studies is recommended.  There is no  evidence of lymphoproliferative process or diagnostic plasma cell  neoplasm.    11/26/2018 BM Cytogenetics:    11/13/23  Myelodysplastic syndrome (MDS) Panel via FISH   Clinical Information/Indication  Myelodysplastic Syndrome (MDS)  Nomenclature  nuc ish(D5S721/D5S23,EGR1)x2[192/200],(D7Z1,D7S486)x2[191/200],(D8Z1x2)[191/200],(D20S108x1)[125/200]  Results  Fluorescence in situ hybridization analysis result: Positive  Probe / Locus Abnormality Assessed Cut-off  (> or =) % Result % Interpretation  EGR1  5q deletion 8.0 2.0 Negative  D5S721/D5S23, EGR1 Monosomy 5 3.0 1.5 Negative  D7S486 7q deletion 6.0 1.5 Negative  D7S486, CEP7 Monosomy 7 4.0 0.5 Negative  D8Z1 Trisomy 8 7.0 1.0 Negative  D20S108 20q deletion 9.0 62.5 Positive   Loss of chromosome 5 or deletion of the long arm is commonly associated with a clinical diagnosis of MDS, therapy  induced MDS (tMDS), and AML. Approximately 10-15% of patients with MDS/AML harbor  del(5q) which, if observed in isolation, is associated with a good prognosis in MDS. It is a poor prognostic marker if seen either as part of a complex karyotype or detected in secondary AML or tMDS.  The del(7q) is commonly associated with a clinical diagnosis of MDS, therapy induced MDS, AML, and de novo AML. Abnormalities of 7q or loss of chromosome 7 is a poor prognostic finding.  Trisomy 8 is a common cytogenetic abnormality observed in myeloid disorders including myelodysplastic syndrome, acute myeloid leukemia, myeloproliferative neoplasms, and as a secondary chromosomal change associated with blast phase CML.  The del(20q) is commonly associated with myeloid disorders such as myeloid dysplastic syndrome (MDS) and myeloproliferative neoplasms. In MDS, del(20q) as a sole abnormality is associated with a good prognosis regarding survival and potential for AML evolution.  Correlation with morphology, cytogenetics, and other clinical and diagnostic information is recommended.  Method  Fluorescence in situ?hybridization (FISH) studies were performed on methanol/acetic acid-fixed interphase nuclei using all or part of the myelodysplastic syndrome probe set listed below Slides were scanned and images of nuclei were captured using the image analysis software program from [employer]. Analysis of captured images was performed using a manual scoring system and a total of 200 nuclei were analyzed for each probe.   Probe set:   LSI EGR1 (5q31) / LSI D5S23, D5S721 (5p15.2), Vysis - dual color  LSI D7S486 (7q31) / CEP7 (7p11.1-q11.1), Vysis - dual color  CEP8 (8p11.1q11.1), Vysis - single color  LSI D20S108 (20q12), Vysis - single color   Reviewed by:  Norwood Mention, PhD Assistant Professor, Pathology/Medical Genetics Chiropodist, Cytogenetics    11/13/2023 Chromosome Analysis, Bone Marrow   Clinical  Information/Indication  Myelodysplastic Syndrome (MDS)  Laboratory Analysis  Culture Type: 24hr1  Metaphases Counted: 20 Metaphases Analyzed: 20 Cells Karyotyped: 20 Band Resolution: 400  Nomenclature  46,XX,del(20)(q11.2q13.1)[20]  Results  Cytogenetic analysis revealed one clonal cell line with a female chromosome complement. This cell line had an abnormal chromosome complement with deletion in the long arm of chromosome 20. No normal cells were detected. The del(20q) is commonly associated with myeloid disorders, such as myeloid dysplastic syndrome (MDS) and myeloproliferative neoplasms. In MDS, del(20q) as a sole abnormality is associated with a good prognosis regarding survival and potential for AML evolution.  Abnormal clonal cell line with the same abnormality was observed in the previous bone marrow samples from this patient. Therefore, these current findings are consistent with the persistence of the disease. Clinical correlation is recommended.  Method  Specimens are cultured under conditions appropriate to the specimen type and clinical indication for testing. G-banded cytogenetic analysis is performed on metaphase spreads from methanol/acetic acid fixed, cultured cells. Images of metaphase spreads are captured using the image analysis software program HiBand from [employer] ([employer]). Analysis and karyotyping of 20 captured images is routinely performed by two technologists; additional metaphases are assessed when indicated.   Reviewed by:  Quinton Bock, PhD, [facility] Assistant Professor, Pathology/Medical Genetics Co-Director, Cytogenetics and Molecular Cytogenetics    11/13/2023 Bone Marrow Evaluation  Case Report  Bone Marrow Pathology Report Case: [id number]  Authorizing Provider: Breck Beverley Barrio, MD Collected: 11/13/2023 1510  Ordering Location: [facility] Received: 11/13/2023 1605  [address] - [address] Hematology  Oncology  Pathologist:  Suzen Sheldon, MD  Specimens: A) - Iliac Crest, Right  B) - Iliac Crest, Right  C) - Iliac Crest, Right   Final Diagnosis  Bone marrow, right iliac crest, aspirate, clot, and core biopsy:  -Hypercellular bone marrow (90%) with persistent involvement by this patient's known myeloid neoplasm (less than 5% blasts by CD34 IHC analysis).   This patient's history of myelodysplastic syndrome and myelofibrosis is noted. In the current bone marrow, grade 2 of 3 reticulin fibrosis (WHO classification) is noted. A next generation sequencing (NGS) myeloid neoplasm panel showed no clinically significant DNA variants or gene fusions. Please see results of concurrent cytogenetic studies.   Clinical Information  Myelodysplastic Syndrome (MDS)  Bone Marrow Differential  (A differential cell count is not reported to due to low cellularity and cellular preservation on the aspirate smears and touch preparation slide.)  Microscopic Description  Peripheral blood smear:  See CBC data.  Platelets: decreased  Red blood cells: anemia; moderate anisopoikilocytosis; few ovalocytes; rare dacryocytes.  White blood cells: leukopenia with neutropenia; rare myelocytes; rare atypical monocytes; no blasts identified.   Bone marrow aspirate/Touch Preparation:  Quality: Hemodilute aspirate smears are received for evaluation. Smears show predominantly blood with rare cells. A differential cell count is not reported to due to low cellularity and cellular preservation on the aspirate smears and touch preparation slide.   The touch preparation of the bone marrow biopsy shows predominantly blood with few scattered cells.   Bone Marrow Clot and Biopsy: Bone Marrow Clot Cellularity: Aspicular (see below) Bone Marrow Biopsy Cellularity: 90% Microscopic Description: Sections of clot show blood with no marrow for evaluation. Sections of biopsy show a hypercellular marrow with trilineage hematopoiesis. Scattered variably  sized megakaryocytes are noted including forms with small hypolobated and dense nuclear chromatin. Scattered variably sized erythroid colonies show a spectrum of maturation. Scattered mature granulocytes are noted. Rarely, dilated sinusoids are noted. No granulomas. Few small interstitial lymphoid aggregates are identified. The results of immunohistochemistry on the biopsy specimen show:   Block Stain Result  C1-3 Heme CD34 Positive in rare cells (less than 5% of marrow cellularity).  C1-4 Heme CD117 Positive in rare scattered mast cells and faintly positive in approximately 5% of the remaining hematopoietic cells.  C1-5 CD3 Positive in few scattered small lymphocytes and few small interstitial lymphoid aggregates.  C1-6 CD20 Positive in rare scattered small lymphocytes in few small interstitial lymphoid aggregates.  C1-7 CK AE1/AE3 Negative.  C1-8 CD61 Highlights scattered variably sized megakaryocytes, including small forms. Few clusters of at least 4 megakaryocytes are noted in the interstitial space.   The special stain for reticulin shows a diffuse and dense increase in reticulin with extensive intersections, occasionally with focal bundles of thick fibres (MF grade 2 of 3, WHO classification).   The positive control slides stain appropriately.   Gross Description  B. SPECIMEN ID: Patient name, medical record number, bone marrow clot DESCRIPTION: 2.5 x 1.8 x 0.3 cm of coagulated blood.  B1 submitted entirely C. SPECIMEN ID: Patient name, medical record number, bone marrow biopsy DESCRIPTION: 2 core(s) of bone, 0.7 cm and 1.9 cm.  C1 submitted entirely after CalFor decalcification    RADIOGRAPHIC STUDIES: I have personally reviewed the radiological images as listed and agreed with the findings in  the report. CT Renal Stone Study Result Date: 12/06/2023 CLINICAL DATA:  Urinary frequency with possibly TI. EXAM: CT ABDOMEN AND PELVIS WITHOUT CONTRAST TECHNIQUE: Multidetector CT  imaging of the abdomen and pelvis was performed following the standard protocol without IV contrast. RADIATION DOSE REDUCTION: This exam was performed according to the departmental dose-optimization program which includes automated exposure control, adjustment of the mA and/or kV according to patient size and/or use of iterative reconstruction technique. COMPARISON:  None Available. FINDINGS: Lower chest: Unremarkable. Hepatobiliary: No suspicious focal abnormality in the liver on this study without intravenous contrast. Noncalcified cholesterol stones are seen in the gallbladder. No intrahepatic or extrahepatic biliary dilation. Pancreas: No focal mass lesion. No dilatation of the main duct. No intraparenchymal cyst. No peripancreatic edema. Spleen: No splenomegaly. No suspicious focal mass lesion. Adrenals/Urinary Tract: No adrenal nodule or mass. Kidneys unremarkable. No evidence for hydroureter. The urinary bladder appears normal for the degree of distention. Stomach/Bowel: Stomach is unremarkable. No gastric wall thickening. No evidence of outlet obstruction. Duodenum is normally positioned as is the ligament of Treitz. No small bowel wall thickening. No small bowel dilatation. The terminal ileum is normal. The appendix is normal. No gross colonic mass. No colonic wall thickening. Diverticular changes are noted in the left colon without evidence of diverticulitis. Vascular/Lymphatic: There is mild atherosclerotic calcification of the abdominal aorta without aneurysm. There is no gastrohepatic or hepatoduodenal ligament lymphadenopathy. No retroperitoneal or mesenteric lymphadenopathy. No pelvic sidewall lymphadenopathy. Reproductive: The uterus is unremarkable.  There is no adnexal mass. Other: No intraperitoneal free fluid. Musculoskeletal: No worrisome lytic or sclerotic osseous abnormality. IMPRESSION: 1. No acute findings in the abdomen or pelvis. Specifically, no findings to explain the patient's history  of urinary frequency. No urinary stone disease. No secondary changes in either kidney or ureter. 2. Cholelithiasis. 3. Left colonic diverticulosis without diverticulitis. 4.  Aortic Atherosclerosis (ICD10-I70.0). Electronically Signed   By: Camellia Candle M.D.   On: 12/06/2023 08:47     ASSESSMENT & PLAN:   68 y.o. female with  1. Minor Spontaneous?/undetected trauma causing bruising No ecchymosis with bruising  Clotting parameters normal from 06/17/17 with normal PT, aPTT and VWD panel and platelet function testing 01/08/18 PT and aPTT, fibrinogen  WNL  2. MDS/MPN with fibrosis -- causing Pancytopenia --  -Previous bone marrow biopsy was done in October 2020 showing possible MDS/MPN overlap versus MDS with fibrosis.  No increase in blasts cytogenetics with deletion 20 q. NGS with only CBL mutation.  Oct 2020 BM Bx - hypercellular bone marrow with some reticulin fibrosis. 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 NGS molecular testing showed CBL mutation   PLAN:  - Discussed lab results on 12/08/2023 in detail with patient: CBC showed WBC of 1.9K decreased from 2.2K, Hemoglobin of 9.5 decreased from 9.9, PLTs of 41K increased from 38K, and Neutrophils of 0.5K decreased from 0.6K CMP with Creatinine 1.17 increased from 0.96.   Recently seen in ED for Dysuria.   She reportedly has been hydrating adequately.   I suggested that there is a possibility that she passed a kidney stone, encourage hydration and urinating on a schedule to prevent retention. Will independently review Iron  Panel, LDH, Ferritin, Vitamin B12  - Reviewed 11/13/2023 BMBX:- no increase in blasts  20q deletion  - Reviewed symptoms indicative of infections (fever/chills or local symptoms) and determinations for presenting to PCP vs ED.   - Reviewed process of bone marrow treatment  Answered questions regarding:  HMA vs allogenenic  transplant  FOLLOW-UP: RTC with Dr Onesimo with labs around  12/20  The total time spent in the appointment was 30 minutes* .  All of the patient's and her daughters' questions were answered with apparent satisfaction. The patient knows to call the clinic with any problems, questions or concerns.   Emaline Onesimo MD MS AAHIVMS [facility] Hematology/Oncology Physician [facility]  .*Total Encounter Time as defined by the Centers for Medicare and Medicaid Services includes, in addition to the face-to-face time of a patient visit (documented in the note above) non-face-to-face time: obtaining and reviewing outside history, ordering and reviewing medications, tests or procedures, care coordination (communications with other health care professionals or caregivers) and documentation in the medical record.   I,  Damien Lagle,acting as a scribe for Emaline Onesimo, MD.,have documented all relevant documentation on the behalf of Emaline Onesimo, MD,as directed by  Emaline Onesimo, MD while in the presence of Emaline Onesimo, MD.  I have reviewed the above documentation for accuracy and completeness, and I agree with the above. Emaline Candida Onesimo MD.

## 2023-12-08 NOTE — Telephone Encounter (Signed)
 Scheduled patient for next appointments. Called and spoke with the patient, she is aware.

## 2023-12-14 ENCOUNTER — Encounter: Payer: Self-pay | Admitting: Hematology

## 2023-12-28 ENCOUNTER — Other Ambulatory Visit: Payer: Self-pay | Admitting: Cardiovascular Disease

## 2024-01-12 ENCOUNTER — Other Ambulatory Visit: Payer: Self-pay | Admitting: Family Medicine

## 2024-01-12 DIAGNOSIS — Z1231 Encounter for screening mammogram for malignant neoplasm of breast: Secondary | ICD-10-CM

## 2024-01-13 ENCOUNTER — Ambulatory Visit
Admission: RE | Admit: 2024-01-13 | Discharge: 2024-01-13 | Disposition: A | Discharge status: Home / Self Care | Source: Ambulatory Visit | Attending: Family Medicine | Admitting: Family Medicine

## 2024-01-13 DIAGNOSIS — Z1231 Encounter for screening mammogram for malignant neoplasm of breast: Secondary | ICD-10-CM

## 2024-01-22 ENCOUNTER — Other Ambulatory Visit: Payer: Self-pay

## 2024-01-22 DIAGNOSIS — D469 Myelodysplastic syndrome, unspecified: Secondary | ICD-10-CM

## 2024-01-23 ENCOUNTER — Inpatient Hospital Stay: Admitting: Hematology

## 2024-01-23 ENCOUNTER — Inpatient Hospital Stay: Attending: Hematology

## 2024-01-23 DIAGNOSIS — D61818 Other pancytopenia: Secondary | ICD-10-CM | POA: Diagnosis not present

## 2024-01-23 DIAGNOSIS — Z87891 Personal history of nicotine dependence: Secondary | ICD-10-CM | POA: Insufficient documentation

## 2024-01-23 DIAGNOSIS — D7581 Myelofibrosis: Secondary | ICD-10-CM | POA: Insufficient documentation

## 2024-01-23 DIAGNOSIS — D469 Myelodysplastic syndrome, unspecified: Secondary | ICD-10-CM | POA: Insufficient documentation

## 2024-01-23 LAB — CMP (CANCER CENTER ONLY)
ALT: 12 U/L (ref 0–44)
AST: 21 U/L (ref 15–41)
Albumin: 4.6 g/dL (ref 3.5–5.0)
Alkaline Phosphatase: 78 U/L (ref 38–126)
Anion gap: 10 (ref 5–15)
BUN: 19 mg/dL (ref 8–23)
CO2: 23 mmol/L (ref 22–32)
Calcium: 9.6 mg/dL (ref 8.9–10.3)
Chloride: 107 mmol/L (ref 98–111)
Creatinine: 1 mg/dL (ref 0.44–1.00)
GFR, Estimated: >60 mL/min (ref >60)
Glucose, Bld: 103 mg/dL — ABNORMAL HIGH (ref 70–99)
Potassium: 4.4 mmol/L (ref 3.5–5.1)
Sodium: 140 mmol/L (ref 135–145)
Total Bilirubin: 0.7 mg/dL (ref 0.0–1.2)
Total Protein: 7.4 g/dL (ref 6.5–8.1)

## 2024-01-23 LAB — FERRITIN: Ferritin: 59 ng/mL (ref 11–307)

## 2024-01-23 LAB — IRON AND IRON BINDING CAPACITY (CC-WL,HP ONLY)
Iron: 79 ug/dL (ref 28–170)
Saturation Ratios: 23 % (ref 10.4–31.8)
TIBC: 350 ug/dL (ref 250–450)
UIBC: 271 ug/dL

## 2024-01-23 LAB — CBC WITH DIFFERENTIAL (CANCER CENTER ONLY)
Abs Immature Granulocytes: 0 K/uL (ref 0.00–0.07)
Basophils Absolute: 0 K/uL (ref 0.0–0.1)
Basophils Relative: 1 %
Eosinophils Absolute: 0 K/uL (ref 0.0–0.5)
Eosinophils Relative: 0 %
HCT: 30.8 % — ABNORMAL LOW (ref 36.0–46.0)
Hemoglobin: 9.8 g/dL — ABNORMAL LOW (ref 12.0–15.0)
Immature Granulocytes: 0 %
Lymphocytes Relative: 62 %
Lymphs Abs: 1 K/uL (ref 0.7–4.0)
MCH: 31.3 pg (ref 26.0–34.0)
MCHC: 31.8 g/dL (ref 30.0–36.0)
MCV: 98.4 fL (ref 80.0–100.0)
Monocytes Absolute: 0.2 K/uL (ref 0.1–1.0)
Monocytes Relative: 12 %
Neutro Abs: 0.4 K/uL — CL (ref 1.7–7.7)
Neutrophils Relative %: 25 %
Platelet Count: 53 K/uL — ABNORMAL LOW (ref 150–400)
RBC: 3.13 MIL/uL — ABNORMAL LOW (ref 3.87–5.11)
RDW: 18.5 % — ABNORMAL HIGH (ref 11.5–15.5)
WBC Count: 1.6 K/uL — ABNORMAL LOW (ref 4.0–10.5)
nRBC: 1.2 % — ABNORMAL HIGH (ref 0.0–0.2)

## 2024-01-23 LAB — VITAMIN B12: Vitamin B-12: >4000 pg/mL — ABNORMAL HIGH (ref 180–914)

## 2024-01-23 LAB — LACTATE DEHYDROGENASE: LDH: 214 U/L (ref 105–235)

## 2024-01-23 NOTE — Progress Notes (Signed)
 HEMATOLOGY ONCOLOGY PROGRESS NOTE  Date of service: 01/23/2024  Patient Care Team: Claudene Pellet, MD as PCP - General (Family Medicine) Delford Maude BROCKS, MD as PCP - Cardiology (Cardiology)  CHIEF COMPLAINT/PURPOSE OF CONSULTATION: Follow-up for continued evaluation and management of ***  HISTORY OF PRESENTING ILLNESS:    SUMMARY OF ONCOLOGIC HISTORY: Oncology History   No history exists.   {ELGKCycle/Day (Optional):34084}  INTERVAL HISTORY: Karen Waller is a 68 y.o. female who is here today for continued evaluation and management of ***. accompanied by daughter   she was last seen by me on 12/08/2023; at the time she {Concerns:31667}  Today, she has been well.  Has met with her transplant team  - Discussed the pre-transplant process and timeline   Sees with transplants team 12/15   Denies any  []  new infection issues,  []  fevers/chills,  []  drenching night sweats,  []  unexpected weight change,  []  back pain,  []  chest pain,  []  abdominal pain,  [x]  new bone pains,  []  new lumps/bumps,  []  leg swelling,  []  bleeding issues (nose bleeds, gum bleeds, abnormal/spontaneous bruising),  []  nausea/vomiting,  []  diarrhea,  []  bowel/urinary changes, []  SOB,  []  change in breathing  She notes that she recently woke up with blood in her mouth  - Spontaneous episode of blood upon waking possibly related to a nose bleed.   - Simple foods, avoid dairy can due yogurt for probiotics, hydration If mucus,bloidy stools contact ys  Having some diarrhea, green and dark.    If neutrophils persistently low, then consideration for chronic antibiotics.   HMA process.    - Extensively discussed transplantation process and compared efficacy for condition management comparative with alternative treatments.   Transplant is considered curative instead of preventative/palliative.   Advised discussion with transplant team regarding questions that could not be answered  today.   UTD vaccines except Tdap.  Notes Covid-19 caused malaise, fever for about 24 hrs. Flu shot caused some temporary LN.      REVIEW OF SYSTEMS:   10 Point review of systems of done and is negative except as noted above.  MEDICAL HISTORY Past Medical History:  Diagnosis Date   Cardiomyopathy    EF 30-35% diagnosed 02/2008   Central obesity    Gout    left knee   HTN (hypertension)    Pancytopenia (HCC)     SURGICAL HISTORY Past Surgical History:  Procedure Laterality Date   KNEE SURGERY     TONSILLECTOMY      SOCIAL HISTORY Social History   Tobacco Use   Smoking status: Former    Current packs/day: 0.00    Types: Cigarettes    Quit date: 02/19/1984    Years since quitting: 39.9   Smokeless tobacco: Never  Vaping Use   Vaping status: Never Used  Substance Use Topics   Alcohol use: Not Currently   Drug use: Never    Social History   Social History Narrative   Not on file    SOCIAL DRIVERS OF HEALTH SDOH Screenings   Food Insecurity: Low Risk  (07/23/2023)   Received from [facility]  Housing: Low Risk  (07/23/2023)   Received from [facility]  Transportation Needs: No Transportation Needs (07/23/2023)   Received from [facility]  Utilities: Low Risk  (07/23/2023)   Received from [facility]  Tobacco Use: Medium Risk (12/29/2023)   Received from [facility]     FAMILY HISTORY Family History  Problem Relation Age of  Onset   Hypertension Mother    Breast cancer Neg Hx      ALLERGIES: is allergic to sulfa antibiotics, codeine, and simvastatin.  MEDICATIONS  Current Outpatient Medications  Medication Sig Dispense Refill   acetaminophen  (TYLENOL ) 500 MG tablet Take 2 tablets (1,000 mg total) by mouth every 6 (six) hours as needed for mild pain (pain score 1-3). 60 tablet 0   allopurinol  (ZYLOPRIM ) 100 MG tablet TAKE 1 TABLET(100 MG) BY MOUTH DAILY 90 tablet 1   amLODipine  (NORVASC ) 10 MG tablet TAKE 1 TABLET(10 MG) BY MOUTH DAILY 90  tablet 3   B Complex Vitamins (B COMPLEX 100 PO) Take 1 tablet by mouth daily.     candesartan  (ATACAND ) 16 MG tablet Take 1 tablet (16 mg total) by mouth daily. 90 tablet 3   carvedilol  (COREG ) 6.25 MG tablet TAKE 1 TABLET BY MOUTH TWICE DAILY WITH A MEAL 180 tablet 2   Cholecalciferol 125 MCG (5000 UT) capsule Take 5,000 Units by mouth daily.     cyclobenzaprine  (FLEXERIL ) 10 MG tablet Take 1 tablet (10 mg total) by mouth 2 (two) times daily as needed for muscle spasms. 20 tablet 0   furosemide  (LASIX ) 20 MG tablet Take 1 tablet (20 mg total) by mouth daily. (Patient taking differently: Take 10 mg by mouth daily.) 90 tablet 3   hydrALAZINE  (APRESOLINE ) 25 MG tablet Take 1 tablet (25 mg total) by mouth 2 (two) times daily. 180 tablet 3   lidocaine  (XYLOCAINE ) 2 % solution Use as directed 15 mLs in the mouth or throat every 3 (three) hours as needed for mouth pain (Sore throat). (Patient not taking: Reported on 08/20/2023) 300 mL 0   methocarbamol  (ROBAXIN ) 500 MG tablet Take 500 mg by mouth as needed for muscle spasms.     Multiple Vitamins-Minerals (MULTIVITAMIN WITH MINERALS) tablet Take 1 tablet by mouth daily.     predniSONE  (DELTASONE ) 10 MG tablet Take 1 tablet (10 mg total) by mouth daily with breakfast. (Patient not taking: Reported on 08/20/2023) 4 tablet 0   rosuvastatin  (CRESTOR ) 5 MG tablet Take 1 tablet (5 mg total) by mouth daily. 90 tablet 3   tiZANidine  (ZANAFLEX ) 4 MG tablet Take 1 tablet (4 mg total) by mouth every 6 (six) hours as needed for muscle spasms. 30 tablet 0   traMADol  (ULTRAM ) 50 MG tablet Take 50 mg by mouth as needed.     No current facility-administered medications for this visit.    PHYSICAL EXAMINATION: ECOG PERFORMANCE STATUS: {CHL ONC ECOG ED:8845999799} VITALS: Vitals:   01/23/24 1322  BP: 120/70  Pulse: (!) 57  Resp: 17  Temp: 97.6 F (36.4 C)  SpO2: 99%   Filed Weights   01/23/24 1322  Weight: 194 lb 11.2 oz (88.3 kg)   Body mass index is 31.43  kg/m.  GENERAL: alert, in no acute distress and comfortable SKIN: no acute rashes, no significant lesions EYES: conjunctiva are pink and non-injected, sclera anicteric OROPHARYNX: MMM, no exudates, no oropharyngeal erythema or ulceration NECK: supple, no JVD LYMPH:  no palpable lymphadenopathy in the cervical, axillary or inguinal regions LUNGS: clear to auscultation b/l with normal respiratory effort HEART: regular rate & rhythm ABDOMEN:  normoactive bowel sounds , non tender, not distended, no hepatosplenomegaly Extremity: no pedal edema PSYCH: alert & oriented x 3 with fluent speech NEURO: no focal motor/sensory deficits  LABORATORY DATA:   I have reviewed the data as listed     Latest Ref Rng & Units 01/23/2024  12:41 PM 12/08/2023   12:44 PM 12/06/2023    2:13 AM  CBC EXTENDED  WBC 4.0 - 10.5 K/uL 1.6  1.9  2.2   RBC 3.87 - 5.11 MIL/uL 3.13  3.00  3.24   Hemoglobin 12.0 - 15.0 g/dL 9.8  9.5  9.9   HCT 63.9 - 46.0 % 30.8  29.4  32.6   Platelets 150 - 400 K/uL 53  41  38   NEUT# 1.7 - 7.7 K/uL 0.4  0.5    Lymph# 0.7 - 4.0 K/uL 1.0  1.1      {ELAddOncLabs (Optional):33736}     Latest Ref Rng & Units 01/23/2024   12:41 PM 12/08/2023   12:44 PM 12/06/2023    2:13 AM  CMP  Glucose 70 - 99 mg/dL 896  77  887   BUN 8 - 23 mg/dL 19  17  19    Creatinine 0.44 - 1.00 mg/dL 8.99  8.82  9.03   Sodium 135 - 145 mmol/L 140  143  141   Potassium 3.5 - 5.1 mmol/L 4.4  3.8  4.2   Chloride 98 - 111 mmol/L 107  110  107   CO2 22 - 32 mmol/L 23  27  21    Calcium  8.9 - 10.3 mg/dL 9.6  89.9  9.7   Total Protein 6.5 - 8.1 g/dL 7.4  7.1    Total Bilirubin 0.0 - 1.2 mg/dL 0.7  0.6    Alkaline Phos 38 - 126 U/L 78  72    AST 15 - 41 U/L 21  19    ALT 0 - 44 U/L 12  14       RADIOGRAPHIC STUDIES: I have personally reviewed the radiological images as listed and agreed with the findings in the report. MM 3D SCREENING MAMMOGRAM BILATERAL BREAST Result Date: 01/19/2024 CLINICAL DATA:   Screening. EXAM: DIGITAL SCREENING BILATERAL MAMMOGRAM WITH TOMOSYNTHESIS AND CAD TECHNIQUE: Bilateral screening digital craniocaudal and mediolateral oblique mammograms were obtained. Bilateral screening digital breast tomosynthesis was performed. The images were evaluated with computer-aided detection. COMPARISON:  Previous exam(s). ACR Breast Density Category b: There are scattered areas of fibroglandular density. FINDINGS: There are no findings suspicious for malignancy. IMPRESSION: No mammographic evidence of malignancy. A result letter of this screening mammogram will be mailed directly to the patient. RECOMMENDATION: Screening mammogram in one year. (Code:SM-B-01Y) BI-RADS CATEGORY  1: Negative. Electronically Signed   By: Alm Parkins M.D.   On: 01/19/2024 11:01   CT Renal Stone Study Result Date: 12/06/2023 CLINICAL DATA:  Urinary frequency with possibly TI. EXAM: CT ABDOMEN AND PELVIS WITHOUT CONTRAST TECHNIQUE: Multidetector CT imaging of the abdomen and pelvis was performed following the standard protocol without IV contrast. RADIATION DOSE REDUCTION: This exam was performed according to the departmental dose-optimization program which includes automated exposure control, adjustment of the mA and/or kV according to patient size and/or use of iterative reconstruction technique. COMPARISON:  None Available. FINDINGS: Lower chest: Unremarkable. Hepatobiliary: No suspicious focal abnormality in the liver on this study without intravenous contrast. Noncalcified cholesterol stones are seen in the gallbladder. No intrahepatic or extrahepatic biliary dilation. Pancreas: No focal mass lesion. No dilatation of the main duct. No intraparenchymal cyst. No peripancreatic edema. Spleen: No splenomegaly. No suspicious focal mass lesion. Adrenals/Urinary Tract: No adrenal nodule or mass. Kidneys unremarkable. No evidence for hydroureter. The urinary bladder appears normal for the degree of distention.  Stomach/Bowel: Stomach is unremarkable. No gastric wall thickening. No evidence of outlet obstruction. Duodenum  is normally positioned as is the ligament of Treitz. No small bowel wall thickening. No small bowel dilatation. The terminal ileum is normal. The appendix is normal. No gross colonic mass. No colonic wall thickening. Diverticular changes are noted in the left colon without evidence of diverticulitis. Vascular/Lymphatic: There is mild atherosclerotic calcification of the abdominal aorta without aneurysm. There is no gastrohepatic or hepatoduodenal ligament lymphadenopathy. No retroperitoneal or mesenteric lymphadenopathy. No pelvic sidewall lymphadenopathy. Reproductive: The uterus is unremarkable.  There is no adnexal mass. Other: No intraperitoneal free fluid. Musculoskeletal: No worrisome lytic or sclerotic osseous abnormality. IMPRESSION: 1. No acute findings in the abdomen or pelvis. Specifically, no findings to explain the patient's history of urinary frequency. No urinary stone disease. No secondary changes in either kidney or ureter. 2. Cholelithiasis. 3. Left colonic diverticulosis without diverticulitis. 4.  Aortic Atherosclerosis (ICD10-I70.0). Electronically Signed   By: Camellia Candle M.D.   On: 12/06/2023 08:47    ASSESSMENT & PLAN:  68 y.o. female with    PLAN: - Discussed lab results on 01/23/2024 in detail with patient: CBC showed {ELGKCBC:33763} CMP with Creatinine *** {ELIncreased/Decreased:33631} from *** and Calcium  *** {ELIncreased/Decreased:33631} from ***.  {ELGKMyeloma&Lightchains (Optional):33764}  FOLLOW-UP in {WEEKS/MONTHS:30939} for labs and follow-up with Dr. Onesimo.  The total time spent in the appointment was *** minutes* .  All of the patient's questions were answered and the patient knows to call the clinic with any problems, questions, or concerns.  Emaline Onesimo MD MS AAHIVMS [facility] Hematology/Oncology Physician [facility]  *Total  Encounter Time as defined by the Centers for Medicare and Medicaid Services includes, in addition to the face-to-face time of a patient visit (documented in the note above) non-face-to-face time: obtaining and reviewing outside history, ordering and reviewing medications, tests or procedures, care coordination (communications with other health care professionals or caregivers) and documentation in the medical record.  I,Emily Lagle,acting as a neurosurgeon for Emaline Onesimo, MD.,have documented all relevant documentation on the behalf of Emaline Onesimo, MD,as directed by  Emaline Onesimo, MD while in the presence of Emaline Onesimo, MD.  I have reviewed the above documentation for accuracy and completeness, and I agree with the above.  Braya Habermehl, MD

## 2024-01-23 NOTE — Progress Notes (Signed)
 CRITICAL VALUE STICKER  CRITICAL VALUE: ANC: 0.4, WBC: 1.6  DATE & TIME NOTIFIED: 01/23/24  13:25  MD NOTIFIED: Onesimo  TIME OF NOTIFICATION: 13:30

## 2024-01-29 ENCOUNTER — Encounter: Payer: Self-pay | Admitting: Hematology

## 2024-01-31 ENCOUNTER — Other Ambulatory Visit: Payer: Self-pay | Admitting: Physician Assistant

## 2024-02-09 ENCOUNTER — Other Ambulatory Visit: Payer: Self-pay

## 2024-02-10 MED ORDER — HYDRALAZINE HCL 25 MG PO TABS: 25.0000 mg | ORAL_TABLET | Freq: Two times a day (BID) | ORAL | 1 refills | Status: AC

## 2024-03-16 ENCOUNTER — Ambulatory Visit: Admitting: Emergency Medicine

## 2024-03-23 ENCOUNTER — Telehealth: Payer: Self-pay | Admitting: Cardiology

## 2024-03-23 NOTE — Progress Notes (Unsigned)
 " Cardiology Office Note   Date:  03/24/2024  ID:  ERENDIDA WRENN, DOB Apr 16, 1955, MRN [id number] PCP: Claudene Pellet, MD  [facility] Providers Cardiologist:  Maude Emmer, MD  History of Present Illness Dustie MONTINE HIGHT is a 69 y.o. female with past medical history of coronary calcifications on CT scan, dilated cardiomyopathy, palpitations, hypertension, hyperlipidemia, pancytopenia, myelodysplastic/myeloproliferative neoplasms. Presents today for a 6 month follow up appointment   Patient previously had EF 35-40% in 2016.  More recently, echocardiogram 05/2021 showed EF 50-55%, no regional wall motion abnormalities, mild LVH, grade 1 DD, normal RV systolic function.  Cardiac monitor in 05/2021 showed predominantly normal sinus rhythm, rare PACs and PVCs.  A cardiac PET in 11/2021 was grossly normal and low risk.  There was 1 segment of mild reversible perfusion defect at the inferior base which may represent a small segment of ischemia in a branch vessel.  However, given overall preserved global MBF are an atypical location for ischemia, finding was likely low risk.  She was last seen by heart care in 08/2023.  At that time, patient was doing well.  Her PCP told her she had a heart murmur.  Underwent echocardiogram in 08/2023 that showed EF 50-55%, no wall motion abnormalities, normal RV systolic function, no significant valvular abnormalities.  Patient is being worked up for stem cell transplant.  Saw [facility] cardiology.  Underwent coronary CTA 02/27/2024 that noted a potential hemodynamically significant lesion in the proximal/mid RCA, coronary calcium  score of 1293 (99th percentile).  Echocardiogram showed EF 50-55%, no significant valvular disease, normal RV size and function.  Cardiac monitor showed predominantly normal sinus rhythm with rare PACs and PVCs.  Today, patient presents for follow-up appointment.  Tells me that she has been doing very well from a cardiac standpoint.  For  the past 3 weeks, she has been less active than normal due to Achilles tendinitis.  Prior to this, she was taking multiple exercise classes per week and walking 2-3 miles per day.  She was able to tolerate these activities without chest pain or dyspnea on exertion.  She denies syncope or near syncope.  Denies lower extremity swelling.  She continues to have palpitations.  Describes feeling like her heart stops for second.  We reviewed the studies from [facility].  I was able to review Dr. Gena Forts recommendations after coronary CTA.  These recommendations were that as patient had no angina or atypical symptoms in the as she had low platelets, PCI was not warranted.  We reviewed these recommendations.  Also reviewed her echocardiogram and cardiac monitor through [facility], both of which were reassuring studies.   Studies Reviewed  Cardiac Studies & Procedures   ______________________________________________________________________________________________   STRESS TESTS  NM PET CT CARDIAC PERFUSION MULTI W/ABSOLUTE BLOODFLOW 12/04/2021  Narrative   The study is grossly normal. The study is low risk.   LV perfusion is grossly normal. There is 1 segment of mild reversal perfusion defect at the inferior base with hypokinesis of the segment. This may represent a small segment of ischemia in a branch vessel, however given overall preserved global MBFR, and atypical location for ischemia, this finding is likely low risk.   Rest left ventricular function is normal. Rest EF: 49 %. Stress left ventricular function is normal. Stress EF: 54 %. End diastolic cavity size is normal. End systolic cavity size is normal. No evidence of transient ischemic dilation (TID) noted.   Myocardial blood flow was computed to be 0.71ml/g/min at  rest and 1.97ml/g/min at stress. Global myocardial blood flow reserve was 2.21 and was normal.   Coronary calcium  was present on the attenuation correction CT images. Severe  coronary calcifications were present. Coronary calcifications were present in the left anterior descending artery, left circumflex artery and right coronary artery distribution(s).   Electronically signed by: Soyla DELENA Merck, MD  EXAM: OVER-READ INTERPRETATION  CT CHEST  The following report is a limited chest CT over-read performed by radiologist Dr. Elsie Ko [facility] on 12/04/2021. This over-read does not include interpretation of cardiac or coronary anatomy or pathology nor does it include evaluation of the PET data. The cardiac PET-CT interpretation by the cardiologist is attached.  COMPARISON:  Chest radiographs 09/01/2021  FINDINGS: Mediastinum/Nodes: No enlarged lymph nodes within the visualized mediastinum.Small hiatal hernia.  Lungs/Pleura: No pleural effusion or pneumothorax. Mild dependent atelectasis at both lung bases. The visualized lungs are otherwise clear.  Upper abdomen: No significant findings in the visualized upper abdomen.  Musculoskeletal/Chest wall: No chest wall mass or suspicious osseous findings within the visualized chest.  IMPRESSION: No significant extracardiac findings within the visualized chest.   Electronically Signed By: Elsie Perone M.D. On: 12/04/2021 15:30   ECHOCARDIOGRAM  ECHOCARDIOGRAM COMPLETE 08/28/2023  Narrative ECHOCARDIOGRAM REPORT    Patient Name:   ALVILDA MCKENNA Date of Exam: 08/28/2023 Medical Rec #:  [id number]        Height:       66.0 in Accession #:    [id number]       Weight:       198.6 lb Date of Birth:  November 11, 1955        BSA:          1.994 m Patient Age:    68 years         BP:           131/78 mmHg Patient Gender: F                HR:           58 bpm. Exam Location:  [address]  Procedure: 2D Echo, Cardiac Doppler, Color Doppler and Strain Analysis (Both Spectral and Color Flow Doppler were utilized during procedure).  Indications:    CMP I42.0 Murmur  R01.1  History:        Patient has prior history of Echocardiogram examinations. Cardiomyopathy, Signs/Symptoms:Murmur; Risk Factors:Hypertension.  Sonographer:    Cherene Ravens Referring Phys: [phone] EMILY C MONGE  IMPRESSIONS   1. Left ventricular ejection fraction, by estimation, is 50 to 55%. The left ventricle has low normal function. The left ventricle has no regional wall motion abnormalities. The average left ventricular global longitudinal strain is -15.0 %. The global longitudinal strain is abnormal. Mild apical hypokinesis on limited windows. 2. Right ventricular systolic function is normal. The right ventricular size is normal. 3. The mitral valve is normal in structure. No evidence of mitral valve regurgitation. No evidence of mitral stenosis. 4. The aortic valve is normal in structure. Aortic valve regurgitation is not visualized. No aortic stenosis is present. 5. The inferior vena cava is normal in size with greater than 50% respiratory variability, suggesting right atrial pressure of 3 mmHg.  FINDINGS Left Ventricle: Left ventricular ejection fraction, by estimation, is 50 to 55%. The left ventricle has low normal function. The left ventricle has no regional wall motion abnormalities. The average left ventricular global longitudinal strain is -15.0 %. Strain was performed and the global longitudinal strain is  abnormal. The left ventricular internal cavity size was normal in size. There is no left ventricular hypertrophy. Left ventricular diastolic parameters are consistent with Grade I diastolic dysfunction (impaired relaxation).   LV Wall Scoring: The apex is hypokinetic.  Right Ventricle: The right ventricular size is normal. No increase in right ventricular wall thickness. Right ventricular systolic function is normal.  Left [facility]: Left atrial size was normal in size.  Right [facility]: Right atrial size was normal in size.  Pericardium: There is no evidence of  pericardial effusion.  Mitral Valve: The mitral valve is normal in structure. No evidence of mitral valve regurgitation. No evidence of mitral valve stenosis.  Tricuspid Valve: The tricuspid valve is normal in structure. Tricuspid valve regurgitation is trivial. No evidence of tricuspid stenosis.  Aortic Valve: The aortic valve is normal in structure. Aortic valve regurgitation is not visualized. No aortic stenosis is present. Aortic valve peak gradient measures 13.1 mmHg.  Pulmonic Valve: The pulmonic valve was normal in structure. Pulmonic valve regurgitation is not visualized. No evidence of pulmonic stenosis.  Aorta: The aortic root is normal in size and structure.  Venous: The inferior vena cava is normal in size with greater than 50% respiratory variability, suggesting right atrial pressure of 3 mmHg.  IAS/Shunts: No atrial level shunt detected by color flow Doppler.   LEFT VENTRICLE PLAX 2D LVIDd:         4.40 cm   Diastology LVIDs:         3.20 cm   LV e' medial:    6.64 cm/s LV PW:         1.50 cm   LV E/e' medial:  13.3 LV IVS:        0.80 cm   LV e' lateral:   8.70 cm/s LVOT diam:     1.90 cm   LV E/e' lateral: 10.1 LV SV:         85 LV SV Index:   43        2D Longitudinal Strain LVOT Area:     2.84 cm  2D Strain GLS (A4C):   -14.9 % 2D Strain GLS (A3C):   -15.0 % 2D Strain GLS (A2C):   -15.0 % 2D Strain GLS Avg:     -15.0 %  RIGHT VENTRICLE             IVC RV Basal diam:  2.60 cm     IVC diam: 1.30 cm RV Mid diam:    2.10 cm RV S prime:     15.30 cm/s TAPSE (M-mode): 2.8 cm  LEFT ATRIUM             Index LA diam:        3.80 cm 1.91 cm/m LA Vol (A2C):   47.0 ml 23.57 ml/m LA Vol (A4C):   53.3 ml 26.73 ml/m LA Biplane Vol: 53.1 ml 26.63 ml/m AORTIC VALVE AV Area (Vmax): 2.11 cm AV Vmax:        181.00 cm/s AV Peak Grad:   13.1 mmHg LVOT Vmax:      135.00 cm/s LVOT Vmean:     87.200 cm/s LVOT VTI:       0.299 m  AORTA Ao Root diam: 2.90 cm Ao Asc  diam:  2.90 cm  MITRAL VALVE               TRICUSPID VALVE MV Area (PHT): 3.39 cm    TR Peak grad:   22.3 mmHg MV Decel Time: 224  msec    TR Vmax:        236.00 cm/s MV E velocity: 88.00 cm/s MV A velocity: 83.80 cm/s  SHUNTS MV E/A ratio:  1.05        Systemic VTI:  0.30 m Systemic Diam: 1.90 cm  Aditya Sabharwal Electronically signed by Ria Commander Signature Date/Time: 08/28/2023/2:28:14 PM    Final    MONITORS  LONG TERM MONITOR (3-14 DAYS) 05/21/2021  Narrative Patch Wear Time:  13 days and 12 hours (2023-03-10T21:25:52-0500 to 2023-03-24T10:34:27-0400)  Patient had a min HR of 49 bpm, max HR of 139 bpm, and avg HR of 73 bpm. Predominant underlying rhythm was Sinus Rhythm. Isolated SVEs were rare (<1.0%), SVE Couplets were rare (<1.0%), and SVE Triplets were rare (<1.0%). Isolated VEs were rare (<1.0%, 6440), VE Couplets were rare (<1.0%, 31), and VE Triplets were rare (<1.0%, 1). Ventricular Bigeminy and Trigeminy were present.  Maude Emmer MD [facility]     CARDIAC MRI  MR CARDIAC MORPHOLOGY W WO CONTRAST 01/13/2013  Narrative CLINICAL DATA:  Cardiomyopathy  EXAM: CARDIAC MRI  TECHNIQUE: The patient was scanned on a 1.5 Tesla GE magnet. A dedicated cardiac coil was used. Functional imaging was done using Fiesta sequences. 2,3, and 4 chamber views were done to assess for RWMA's. Modified Simpson's rule using a short axis stack was used to calculate an ejection fraction on a dedicated work [employer]. The patient received 30 cc of Multihance . After 10 minutes inversion recovery sequences were used to assess for infiltration and scar tissue.  CONTRAST:  30 cc Multihance   FINDINGS: There was mild LAE. There was mild LVE. There was evidence for ventricular noncompaction. There was diffuse hypokinesis. The quantitative EF was 47% (EDV 151, ESV 80 SV 70) There were no discrete RWMA;s The AV, TV and MV were structurally normal. There was no  ASD/VSD. There was no effusion. The RA and RV were normal in size The ascending aortic root was normal. Delayed enhancement images showed no scar or infarct.  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  3) Mild LAE  4) Normal RA and RV  Maude Emmer   Electronically Signed By: Maude Emmer M.D. On: 01/13/2013 17:12   ______________________________________________________________________________________________     Also reviewed studies from 02/2024 though [facility] - under Care Everywhere   Risk Assessment/Calculations           Physical Exam VS:  BP 114/72   Pulse 83   Wt 196 lb 6.4 oz (89.1 kg)   SpO2 99%   BMI 31.70 kg/m        Wt Readings from Last 3 Encounters:  03/24/24 196 lb 6.4 oz (89.1 kg)  01/23/24 194 lb 11.2 oz (88.3 kg)  12/08/23 196 lb (88.9 kg)    GEN: Well nourished, well developed in no acute distress. Sitting comfortably in the chair  NECK: No JVD  CARDIAC:  RRR, no murmurs, rubs, gallops. Radial pulses 2+ bilaterally  RESPIRATORY:  Clear to auscultation without rales, wheezing or rhonchi. Normal WOB on room air   ABDOMEN: Soft, non-tender, non-distended EXTREMITIES:  No edema in BLE; No deformity   ASSESSMENT AND PLAN  Coronary Artery Disease  Myelodysplastic/myeloproliferative neoplasms  - Patient in the process of being worked up for a stem cell transplant. Underwent coronary CTA through [facility] that showed a potential hemodynamically significant lesion in the mid RCA.  - Per Dr. Sugethra Vasu with  [facility] 03/04/24- Abnormal CCTA- showed obst lesion in RCA> has no angina or atypical symptoms. LDL 35, normotensive. Plts 30-50. In the absence of angina, and given low plts, pci is not warranted. She is medically optimized and given lack of significant disease in Left system, would manage this medically peri-transplant. Spoke to pt and explained  this. PCI warranted post transplant if she has angina and has plts that allow 3-6 months of DAPT.  - Patient denies chest pain or DOE. Able to participate in exercise classes and walk 2-3 miles. Reviewed her coronary CTA and Dr. Gena Vasu's recommendations. I agree that with her lack of anginal symptoms and low platelets, PCI not warranted. We will continue medical management for now. Discussed that if her stem cell transplant is successful and platelets stabilize, we could pursue cath in the future if she develops symptoms  - Continue amlodipine  10 mg daily, carvedilol  6.25 mg BID  - Continue crestor  5 mg daily  - No ASA for now with low platelets   Chronic HFimpEF  HTN  - EF previously 35-40% in 2016. Most recent echocardiogram through [facility] 02/2024 showed EF 50-55%, no significant valvular disease, normal RV size and function - Patient denies shortness of breath, ankle swelling.  She is euvolemic on exam today.  BP well-controlled.  No symptoms concerning for orthostatic hypotension - Continue carvedilol  6.25 mg BID, hydralazine  25 mg BID, candesartan  16 mg daily, amlodipine  10 mg daily - Continue lasix  20 mg daily  - K 4.4, creatinine 1.00 in 01/2024   Palpitations  - Patient has had rare PVCs and PACs on past monitors  - Continue carvedilol  6.25 mg BID   HLD  - Lipid panel from 01/2024 showed LDL 35, HDL 43, triglycerides 130, total cholesterol 99  - Reviewed low cholesterol, high fiber diet  - Continue crestor  5 mg daily   Dispo: Follow-up in 6 months with Dr. Delford   Signed, Rollo FABIENE Louder, PA-C   "

## 2024-03-23 NOTE — Telephone Encounter (Signed)
 Left message to call back if anything further needed.

## 2024-03-23 NOTE — Telephone Encounter (Signed)
 Pt asking if CT from 1/9 with [facility] can be reviewed before her appt tomorrow. Please advise.

## 2024-03-24 ENCOUNTER — Encounter: Payer: Self-pay | Admitting: Cardiology

## 2024-03-24 ENCOUNTER — Ambulatory Visit: Admitting: Cardiology

## 2024-03-24 VITALS — BP 114/72 | HR 83 | Wt 196.4 lb

## 2024-03-24 DIAGNOSIS — I10 Essential (primary) hypertension: Secondary | ICD-10-CM | POA: Diagnosis not present

## 2024-03-24 DIAGNOSIS — E785 Hyperlipidemia, unspecified: Secondary | ICD-10-CM | POA: Diagnosis not present

## 2024-03-24 DIAGNOSIS — R002 Palpitations: Secondary | ICD-10-CM

## 2024-03-24 DIAGNOSIS — D471 Chronic myeloproliferative disease: Secondary | ICD-10-CM | POA: Diagnosis not present

## 2024-03-24 DIAGNOSIS — I5020 Unspecified systolic (congestive) heart failure: Secondary | ICD-10-CM | POA: Diagnosis not present

## 2024-03-24 DIAGNOSIS — I2510 Atherosclerotic heart disease of native coronary artery without angina pectoris: Secondary | ICD-10-CM | POA: Diagnosis not present

## 2024-03-24 NOTE — Patient Instructions (Signed)
 Medication Instructions:  None  *If you need a refill on your cardiac medications before your next appointment, please call your pharmacy*  Lab Work: None  If you have labs (blood work) drawn today and your tests are completely normal, you will receive your results only by: MyChart Message (if you have MyChart) OR A paper copy in the mail If you have any lab test that is abnormal or we need to change your treatment, we will call you to review the results.  Testing/Procedures: None   Follow-Up: At [facility], you and your health needs are our priority.  As part of our continuing mission to provide you with exceptional heart care, our providers are all part of one team.  This team includes your primary Cardiologist (physician) and Advanced Practice Providers or APPs (Physician Assistants and Nurse Practitioners) who all work together to provide you with the care you need, when you need it.  Your next appointment:   6 month(s)  Provider:   Maude Emmer, MD    We recommend signing up for the patient portal called MyChart.  Sign up information is provided on this After Visit Summary.  MyChart is used to connect with patients for Virtual Visits (Telemedicine).  Patients are able to view lab/test results, encounter notes, upcoming appointments, etc.  Non-urgent messages can be sent to your provider as well.   To learn more about what you can do with MyChart, go to [email or web address].   Other Instructions None

## 2024-04-14 ENCOUNTER — Ambulatory Visit: Admitting: Emergency Medicine

## 2024-04-21 ENCOUNTER — Inpatient Hospital Stay

## 2024-04-21 ENCOUNTER — Inpatient Hospital Stay: Admitting: Hematology

## 2024-04-28 ENCOUNTER — Ambulatory Visit: Admitting: Emergency Medicine
